# Patient Record
Sex: Female | Born: 1935
Health system: Southern US, Community
[De-identification: ages and names within clinical notes are randomized; demographics above are authoritative.]

## PROBLEM LIST (undated history)

## (undated) DIAGNOSIS — M4800 Spinal stenosis, site unspecified: Secondary | ICD-10-CM

## (undated) DIAGNOSIS — E119 Type 2 diabetes mellitus without complications: Secondary | ICD-10-CM

## (undated) DIAGNOSIS — H348392 Tributary (branch) retinal vein occlusion, unspecified eye, stable: Secondary | ICD-10-CM

## (undated) DIAGNOSIS — R269 Unspecified abnormalities of gait and mobility: Secondary | ICD-10-CM

## (undated) DIAGNOSIS — H9190 Unspecified hearing loss, unspecified ear: Secondary | ICD-10-CM

## (undated) DIAGNOSIS — I639 Cerebral infarction, unspecified: Secondary | ICD-10-CM

## (undated) DIAGNOSIS — D72829 Elevated white blood cell count, unspecified: Secondary | ICD-10-CM

## (undated) DIAGNOSIS — H35032 Hypertensive retinopathy, left eye: Secondary | ICD-10-CM

## (undated) DIAGNOSIS — I10 Essential (primary) hypertension: Secondary | ICD-10-CM

## (undated) DIAGNOSIS — H401190 Primary open-angle glaucoma, unspecified eye, stage unspecified: Secondary | ICD-10-CM

## (undated) DIAGNOSIS — G4700 Insomnia, unspecified: Secondary | ICD-10-CM

## (undated) DIAGNOSIS — M810 Age-related osteoporosis without current pathological fracture: Secondary | ICD-10-CM

## (undated) DIAGNOSIS — E785 Hyperlipidemia, unspecified: Secondary | ICD-10-CM

## (undated) DIAGNOSIS — G629 Polyneuropathy, unspecified: Secondary | ICD-10-CM

## (undated) HISTORY — DX: Cerebral infarction, unspecified: I63.9

## (undated) HISTORY — PX: TOTAL HIP ARTHROPLASTY: SHX124

## (undated) HISTORY — DX: Insomnia, unspecified: G47.00

## (undated) HISTORY — PX: EYE SURGERY: SHX253

## (undated) HISTORY — DX: Primary open-angle glaucoma, unspecified eye, stage unspecified: H40.1190

## (undated) HISTORY — DX: Tributary (branch) retinal vein occlusion, unspecified eye, stable: H34.8392

## (undated) HISTORY — DX: Unspecified hearing loss, unspecified ear: H91.90

## (undated) HISTORY — DX: Hyperlipidemia, unspecified: E78.5

## (undated) HISTORY — DX: Essential (primary) hypertension: I10

## (undated) HISTORY — DX: Type 2 diabetes mellitus without complications: E11.9

## (undated) HISTORY — DX: Age-related osteoporosis without current pathological fracture: M81.0

## (undated) HISTORY — DX: Elevated white blood cell count, unspecified: D72.829

## (undated) HISTORY — DX: Polyneuropathy, unspecified: G62.9

## (undated) HISTORY — DX: Unspecified abnormalities of gait and mobility: R26.9

## (undated) HISTORY — DX: Hypertensive retinopathy, left eye: H35.032

## (undated) HISTORY — DX: Spinal stenosis, site unspecified: M48.00

---

## 1989-03-21 HISTORY — PX: TOTAL HIP ARTHROPLASTY: SHX124

## 2003-03-22 HISTORY — PX: SPINE SURGERY: SHX786

## 2006-11-08 ENCOUNTER — Ambulatory Visit: Payer: Self-pay | Admitting: Vascular Surgery

## 2006-11-08 ENCOUNTER — Ambulatory Visit (HOSPITAL_COMMUNITY): Admission: RE | Admit: 2006-11-08 | Discharge: 2006-11-08 | Payer: Self-pay | Admitting: Family Medicine

## 2011-01-20 DIAGNOSIS — H401190 Primary open-angle glaucoma, unspecified eye, stage unspecified: Secondary | ICD-10-CM

## 2011-01-20 HISTORY — DX: Primary open-angle glaucoma, unspecified eye, stage unspecified: H40.1190

## 2011-02-18 DIAGNOSIS — H409 Unspecified glaucoma: Secondary | ICD-10-CM | POA: Insufficient documentation

## 2011-02-18 DIAGNOSIS — G629 Polyneuropathy, unspecified: Secondary | ICD-10-CM | POA: Insufficient documentation

## 2011-02-18 DIAGNOSIS — E119 Type 2 diabetes mellitus without complications: Secondary | ICD-10-CM | POA: Insufficient documentation

## 2012-01-06 DIAGNOSIS — Z961 Presence of intraocular lens: Secondary | ICD-10-CM | POA: Insufficient documentation

## 2012-01-06 DIAGNOSIS — H401190 Primary open-angle glaucoma, unspecified eye, stage unspecified: Secondary | ICD-10-CM | POA: Insufficient documentation

## 2012-01-06 DIAGNOSIS — H2100 Hyphema, unspecified eye: Secondary | ICD-10-CM | POA: Insufficient documentation

## 2012-01-06 DIAGNOSIS — H348392 Tributary (branch) retinal vein occlusion, unspecified eye, stable: Secondary | ICD-10-CM

## 2012-01-06 DIAGNOSIS — H4050X0 Glaucoma secondary to other eye disorders, unspecified eye, stage unspecified: Secondary | ICD-10-CM | POA: Insufficient documentation

## 2012-01-06 DIAGNOSIS — H02839 Dermatochalasis of unspecified eye, unspecified eyelid: Secondary | ICD-10-CM | POA: Insufficient documentation

## 2012-01-06 HISTORY — DX: Tributary (branch) retinal vein occlusion, unspecified eye, stable: H34.8392

## 2012-03-06 DIAGNOSIS — N179 Acute kidney failure, unspecified: Secondary | ICD-10-CM | POA: Insufficient documentation

## 2012-03-06 DIAGNOSIS — I10 Essential (primary) hypertension: Secondary | ICD-10-CM | POA: Insufficient documentation

## 2012-03-06 DIAGNOSIS — E871 Hypo-osmolality and hyponatremia: Secondary | ICD-10-CM | POA: Insufficient documentation

## 2012-03-06 DIAGNOSIS — E872 Acidosis: Secondary | ICD-10-CM | POA: Insufficient documentation

## 2012-03-06 DIAGNOSIS — E119 Type 2 diabetes mellitus without complications: Secondary | ICD-10-CM | POA: Insufficient documentation

## 2012-04-18 DIAGNOSIS — H35039 Hypertensive retinopathy, unspecified eye: Secondary | ICD-10-CM | POA: Insufficient documentation

## 2012-04-18 DIAGNOSIS — H35032 Hypertensive retinopathy, left eye: Secondary | ICD-10-CM

## 2012-04-18 DIAGNOSIS — H44519 Absolute glaucoma, unspecified eye: Secondary | ICD-10-CM | POA: Insufficient documentation

## 2012-04-18 HISTORY — DX: Hypertensive retinopathy, left eye: H35.032

## 2013-11-07 DIAGNOSIS — E1142 Type 2 diabetes mellitus with diabetic polyneuropathy: Secondary | ICD-10-CM | POA: Insufficient documentation

## 2013-11-07 DIAGNOSIS — D72820 Lymphocytosis (symptomatic): Secondary | ICD-10-CM | POA: Insufficient documentation

## 2013-11-07 DIAGNOSIS — D72829 Elevated white blood cell count, unspecified: Secondary | ICD-10-CM

## 2013-11-07 HISTORY — DX: Elevated white blood cell count, unspecified: D72.829

## 2014-02-06 DIAGNOSIS — G4700 Insomnia, unspecified: Secondary | ICD-10-CM | POA: Insufficient documentation

## 2015-10-05 DIAGNOSIS — R269 Unspecified abnormalities of gait and mobility: Secondary | ICD-10-CM

## 2015-10-05 HISTORY — DX: Unspecified abnormalities of gait and mobility: R26.9

## 2015-12-02 DIAGNOSIS — H9190 Unspecified hearing loss, unspecified ear: Secondary | ICD-10-CM

## 2015-12-02 HISTORY — DX: Unspecified hearing loss, unspecified ear: H91.90

## 2016-04-18 LAB — HEPATIC FUNCTION PANEL
ALK PHOS: 23 U/L — AB (ref 25–125)
ALT: 14 U/L (ref 7–35)
AST: 23 U/L (ref 13–35)
BILIRUBIN, TOTAL: 0.3 mg/dL

## 2016-04-18 LAB — CBC AND DIFFERENTIAL
HCT: 43 % (ref 36–46)
HEMOGLOBIN: 14.4 g/dL (ref 12.0–16.0)
Platelets: 190 10*3/uL (ref 150–399)
WBC: 11.8 10*3/mL

## 2016-04-18 LAB — BASIC METABOLIC PANEL
BUN: 20 mg/dL (ref 4–21)
Glucose: 135 mg/dL
POTASSIUM: 4.3 mmol/L (ref 3.4–5.3)
SODIUM: 146 mmol/L (ref 137–147)

## 2016-08-22 ENCOUNTER — Encounter: Payer: Self-pay | Admitting: *Deleted

## 2016-08-22 ENCOUNTER — Other Ambulatory Visit: Payer: Self-pay | Admitting: *Deleted

## 2016-08-22 ENCOUNTER — Telehealth: Payer: Self-pay | Admitting: *Deleted

## 2016-08-22 NOTE — Telephone Encounter (Signed)
Received CD from [facility] with pts medical records. Diane printed the last 2 years. Pt has new pt apt on 09/05/16. Records put on Dr. Idelle Leech desk for review.

## 2016-08-31 ENCOUNTER — Encounter: Payer: Self-pay | Admitting: Family Medicine

## 2016-08-31 DIAGNOSIS — M810 Age-related osteoporosis without current pathological fracture: Secondary | ICD-10-CM | POA: Insufficient documentation

## 2016-08-31 DIAGNOSIS — E785 Hyperlipidemia, unspecified: Secondary | ICD-10-CM | POA: Insufficient documentation

## 2016-08-31 DIAGNOSIS — I10 Essential (primary) hypertension: Secondary | ICD-10-CM | POA: Insufficient documentation

## 2016-08-31 DIAGNOSIS — M4800 Spinal stenosis, site unspecified: Secondary | ICD-10-CM | POA: Insufficient documentation

## 2016-08-31 DIAGNOSIS — G629 Polyneuropathy, unspecified: Secondary | ICD-10-CM | POA: Insufficient documentation

## 2016-09-01 ENCOUNTER — Telehealth: Payer: Self-pay | Admitting: Family Medicine

## 2016-09-01 ENCOUNTER — Encounter: Payer: Self-pay | Admitting: Family Medicine

## 2016-09-01 ENCOUNTER — Ambulatory Visit (INDEPENDENT_AMBULATORY_CARE_PROVIDER_SITE_OTHER): Payer: Medicare HMO | Admitting: Family Medicine

## 2016-09-01 VITALS — BP 171/84 | HR 60 | Temp 98.2°F | Resp 20 | Ht 63.5 in | Wt 152.2 lb

## 2016-09-01 DIAGNOSIS — E119 Type 2 diabetes mellitus without complications: Secondary | ICD-10-CM

## 2016-09-01 DIAGNOSIS — I10 Essential (primary) hypertension: Secondary | ICD-10-CM | POA: Diagnosis not present

## 2016-09-01 DIAGNOSIS — G629 Polyneuropathy, unspecified: Secondary | ICD-10-CM | POA: Diagnosis not present

## 2016-09-01 DIAGNOSIS — D72820 Lymphocytosis (symptomatic): Secondary | ICD-10-CM | POA: Diagnosis not present

## 2016-09-01 DIAGNOSIS — E785 Hyperlipidemia, unspecified: Secondary | ICD-10-CM | POA: Diagnosis not present

## 2016-09-01 MED ORDER — GABAPENTIN 300 MG PO CAPS: 600.0000 mg | ORAL_CAPSULE | Freq: Three times a day (TID) | ORAL | 5 refills | Status: DC

## 2016-09-01 MED ORDER — PRAVASTATIN SODIUM 10 MG PO TABS: 10.0000 mg | ORAL_TABLET | Freq: Every day | ORAL | 3 refills | Status: DC

## 2016-09-01 MED ORDER — METOPROLOL SUCCINATE ER 25 MG PO TB24: 25.0000 mg | ORAL_TABLET | Freq: Every day | ORAL | 0 refills | Status: DC

## 2016-09-01 MED ORDER — LISINOPRIL 20 MG PO TABS
20.0000 mg | ORAL_TABLET | Freq: Every day | ORAL | 0 refills | Status: DC
Start: 2016-09-01 — End: 2016-09-16

## 2016-09-01 MED ORDER — HYDRALAZINE HCL 25 MG PO TABS: 25.0000 mg | ORAL_TABLET | Freq: Two times a day (BID) | ORAL | 0 refills | Status: DC

## 2016-09-01 NOTE — Telephone Encounter (Signed)
Please call pt: - Patient was seen for establishment today, she is to have a follow-up appointment in 1-2 weeks for her hypertension follow-up. If possible I would like patient to come in 2 days prior to that follow-up appointment with the provider to have fasting labs completed. - Have placed future orders for these labs. - Please explained to her that if she can have them completed prior to her appointment, must be fasting, then we can review the results during her appointment time with me.

## 2016-09-01 NOTE — Progress Notes (Addendum)
Patient ID: Madison Martinez, female  DOB: 1928/01/19, 83 y.o.   MRN: [id number] Patient Care Team    Relationship Specialty Notifications Start End  Ma Hillock, DO PCP - General Family Medicine  09/01/16   Verdell Carmine, MD Referring Physician Internal Medicine  09/01/16   Renelda Loma, OD  Optometry  09/01/16     Chief Complaint  Patient presents with  . Establish Care  . Hypertension    Subjective:  Madison Martinez is a 83 y.o.  female present for new patient establishment. All past medical history, surgical history, allergies, family history, immunizations, medications and social history were obtained by patient and extensive EMR review today in the care everywhere tab. All recent labs, ED visits and hospitalizations within the last year were reviewed.  Essential hypertension/hyperlipidemia Pt reports compliance with Metoprolol-XL 25 mg daily, hydralazine 25 mg twice a [place] and lisinopril 20 mg daily. She does admit that she has been out of this medication for a couple days. Blood pressures ranges at home on the above regimen is reported to be around 601 systolic (that she remembers). Patient denies chest pain, shortness of breath or lower extremity edema. Pt does take a daily baby ASA. Pt is  prescribed  a statin. BMP: 04/18/2016, creatinine 0.92, BUN 20, potassium 4.3, GFR 57. Lipids: 08/05/2015 tchol 254, trig 452 CBC: 04/18/2016 CBC 11.8, hemoglobin 14.4, hematocrit 42.5, platelet 190, lymphocytes absolute 5.0 (high) monocytes absolute 0.7 (high) Diet: Monitors her diet, low-sodium Exercise: Does not exercise much, walks with a cane RF: Hypertension, hyperlipidemia, family history of heart disease  Type 2 diabetes mellitus without complication, without long-term current use of insulin (Madison Martinez) Patient has not been on medications for her diabetes for multiple years. Last available A1c was 6.9 08/05/2015. Patient states that she monitors her diet to control her  diabetes.  Polyneuropathy Uncertain cause for her polyneuropathy, unable to ease together any type of history. Uncertain if this is secondary to diabetes, however she's not on medications for her diabetes. She also has a history of spinal stenosis. She was placed on gabapentin in which she reports she takes 600 mg 3 times a [place]. GFR 57; 03/2016.  Monoclonal B-cell lymphocytosis: Patient does not provide much information surrounding her lymphocytosis. She reports that they thought she had cancer, but the blood tests that she did not, so she didn't go back. Electronic medical record review under the care everywhere tab, was able to locate record that the patient was seen and hematology/oncology in [place], though [place] with Dr. Vira Agar for leukocytosis, which the records suggest present since 2011. There are absolute lymphocyte count ranging from 5.2-5.7 over the time of 2011-2015. The patient was reported asymptomatic. Cytogenic testing was completed and showed a, kappa monocytic CD 5+ B-cell population (13% of total cells), and her absolute lymphocyte count was normal per their office notes. They recommended patient be seen every 3 months to monitor for possible progression to chronic lymphocytic leukemia, but patient did not desire follow-up secondary to monetary cost and was going to be followed by her primary care physician. Patient does not seem to completely understand the depth of the above findings, although not suggestive of leukemia at the time, should be monitored routinely for potential progression of recommendations of the oncologist. Since 08/08/2013 - 04/18/2016: WBC count has ranged from 9.7-12.7. Absolute lymphocytes 4.6-6.1, last being 5.0. She doesn't report any family history concerning for cancers of the lymph system. She denies any night sweats, weight  loss, easy bruising or increased fatigue. Per records she has been referred to podiatry for fungal nail infection and she  had a urinary tract infection, other than those incidences no reports of frequent infections.  Depression screen [facility] 2/9 09/01/2016  Decreased Interest 0  Down, Depressed, Hopeless 0  PHQ - 2 Score 0   No flowsheet data found.   Current Exercise Habits: Home exercise routine, Type of exercise: walking, Time (Minutes): 15, Frequency (Times/Week): 3, Weekly Exercise (Minutes/Week): 45, Intensity: Mild   Fall Risk  09/01/2016  Falls in the past year? No  Risk for fall due to : Impaired balance/gait;Impaired mobility      There is no immunization history on file for this patient.  No exam data present  Past Medical History:  Diagnosis Date  . Blind hypertensive eye, left 04/18/2012  . Gait disturbance 10/05/2015   pt declined neuro referral per records  . Hard of hearing 12/02/2015   hearing aids  . Hyperlipidemia   . Hypertension   . Insomnia    "tried melatonin, advil PM, xanax and klonopin without success"  . Leukocytosis 11/07/2013   seen oncology, did not want to continue to follow there, so continued follow up at PCP.  . Osteoporosis   . Polyneuropathy   . Primary open angle glaucoma 01/2011  . Spinal stenosis   . Stable branch retinal vein occlusion 01/06/2012  . Type 2 diabetes mellitus ([place])    "diet controlled"   Allergies  Allergen Reactions  . Amlodipine Besylate     Other reaction(s): Vertigo  . Flu Virus Vaccine Swelling   Past Surgical History:  Procedure Laterality Date  . EYE SURGERY    . [place]  2005  . TOTAL HIP ARTHROPLASTY Left 1991  . TOTAL HIP ARTHROPLASTY Right ?   x 2   Family History  Problem Relation Age of Onset  . Heart disease Brother    Social History   Social History  . Marital status: Divorced    Spouse name: N/A  . Number of children: 3  . Years of education: 12   Occupational History  . Not on file.   Social History Main Topics  . Smoking status: Never Smoker  . Smokeless tobacco: Never Used  . Alcohol use  No  . Drug use: No  . Sexual activity: No   Other Topics Concern  . Not on file   Social History Narrative   Divorced. 3 children [name], Max and [name]. Lives alone.   High school graduate.   Takes a daily vitamin.   Wears her seatbelt. Wears a hearing aid. Wears dentures.   Requires a cane for assistive device for walking.   Smoke detector in the home.   Feels safe in her relationships.   Allergies as of 09/01/2016      Reactions   Amlodipine Besylate    Other reaction(s): Vertigo   Flu Virus Vaccine Swelling      Medication List       Accurate as of 09/01/16  1:49 PM. Always use your most recent med list.          aspirin 81 MG EC tablet Take 1 tablet by mouth daily.   dorzolamide-timolol 22.3-6.8 MG/ML ophthalmic solution Commonly known as:  COSOPT Place 1 drop into the left eye 2 (two) times daily.   gabapentin 300 MG capsule Commonly known as:  NEURONTIN Take 2 capsules (600 mg total) by mouth 3 (three) times daily.   hydrALAZINE 25  MG tablet Commonly known as:  APRESOLINE Take 1 tablet (25 mg total) by mouth 2 (two) times daily.   latanoprost 0.005 % ophthalmic solution Commonly known as:  XALATAN Place 1 drop into the right eye at bedtime.   lisinopril 20 MG tablet Commonly known as:  PRINIVIL,ZESTRIL Take 1 tablet (20 mg total) by mouth daily.   metoprolol succinate 25 MG 24 hr tablet Commonly known as:  TOPROL-XL Take 1 tablet (25 mg total) by mouth daily.   pravastatin 10 MG tablet Commonly known as:  PRAVACHOL Take 1 tablet (10 mg total) by mouth daily.   VITAMIN D-1000 MAX ST 1000 units tablet Generic drug:  Cholecalciferol Take 2 tablets by mouth daily.       All past medical history, surgical history, allergies, family history, immunizations andmedications were updated in the EMR today and reviewed under the history and medication portions of their EMR.    No results found for this or any previous visit (from the past 2160  hour(s)).  No results found.   ROS: 14 pt review of systems performed and negative (unless mentioned in an HPI)  Objective: BP (!) 171/84 (BP Location: Left Arm, Patient Position: Sitting, Cuff Size: Normal)   Pulse 60   Temp 98.2 F (36.8 C)   Resp 20   Ht 5' 3.5" (1.613 m)   Wt 152 lb 4 oz (69.1 kg)   SpO2 95%   BMI 26.55 kg/m  Gen: Afebrile. No acute distress. Nontoxic in appearance, well-developed, well-nourished,  Very hard of hearing, pleasant Caucasian female. HENT: AT. [place].. MMM, no oral lesions, adequate dentition. no Cough on exam, no hoarseness on exam. Eyes:Pupils Equal Round Reactive to light, Extraocular movements intact,  Conjunctiva without redness, discharge or icterus. CV: RRR no murmur appreciated, no edema, +2/4 P posterior tibialis pulses. no carotid bruits. No JVD. Chest: CTAB, no wheeze, rhonchi or crackles.  Abd: Soft.NTND. BS present Skin:no rashes, purpura or petechiae. Warm and well-perfused. Skin intact. Neuro/Msk: Walks with cane. Legally blind left eye. Severely hard of hearing. Alert. Oriented x3.   Psych: Normal affect, dress and demeanor. Normal speech. Normal thought content and judgment.   Assessment/plan: Madison Martinez is a 83 y.o. female present for establishment of care. Patient is a rather poor historian, she is present with her son today who provides some history. Majority of the history was collected through electronic medical record research under care everywhere TAB. Essential hypertension/hyperlipidemia - Patient has been without her medications over the last 3 days. - Refills on Toprol XL 25 mg daily, hydralazine 25 mg twice a [place], and lisinopril 20 mg daily, given her home blood pressures she reports her normal on that regimen. Uncertain why hydralazine was added, instead of lisinopril increased, likely not metastatic, but will refill at this time and monitor on follow-up within next few weeks. Ideally would rather use one agent if  possible. - Patient is prescribed pravastatin, her last lipid panel was with elevated cholesterol extremely elevated triglycerides, uncertain if she was fasting. Last collected May 2017. Will have patient come in fasting for next appointment if able. - metoprolol succinate (TOPROL-XL) 25 MG 24 hr tablet; Take 1 tablet (25 mg total) by mouth daily.  Dispense: 30 tablet; Refill: 0 - hydrALAZINE (APRESOLINE) 25 MG tablet; Take 1 tablet (25 mg total) by mouth 2 (two) times daily.  Dispense: 60 tablet; Refill: 0 - lisinopril (PRINIVIL,ZESTRIL) 20 MG tablet; Take 1 tablet (20 mg total) by mouth daily.  Dispense: 30 tablet;  Refill: 0 - Refills on pravastatin provided. - Follow-up 1-2 weeks for blood pressure recheck, patient will be monitoring blood pressure at home and bring in recordings. We will collect CMP at that time.  Type 2 diabetes mellitus without complication, without long-term current use of insulin ([facility]) Last A1c 6.9 approximately one year ago in July 2016. She has been diet controlled. We will collect A1c with her follow-up appointment in 2 weeks.  Polyneuropathy - Uncertain cause or onset of polyneuropathy. Patient seems to have done well on gabapentin. On rather high-dose for age, but she denies sedation, and kidney function has been good. There has been some reports in past records of dizziness, that could have been secondary to gabapentin dose, will investigate further and attempt to taper back if able. - Refills on gabapentin provided.  Monoclonal B-cell lymphocytosis - Briefly discussed with patient and her son today the recommendations by the oncologist, reasoning, brief education on monoclonal B-cell lymphocytosis and CLL. Patient was provided with AVS information on signs/symptoms  of CLL.  - Collect CBC with differential, and peripheral smear on follow-up next week. Could consider flow cytometry. - Low threshold to refer back to hematology/oncology  Return in about 10 days  (around 09/11/2016) for HTN. and labs. Next appointment CBC with differential, peripheral smear, A1c, CMP and lipids.  Greater than 45 minutes was spent with patient, greater than 50% of that time was spent face-to-face with patient counseling, researching electronic medical records and coordinating care.    Electronically signed by: Howard Pouch, DO [place]

## 2016-09-01 NOTE — Patient Instructions (Addendum)
Restart medications for blood pressure. Follow up around 10 days and we will recheck BP and labs.     This is what the blood specialist was monitoring you for.... Chronic Lymphocytic Leukemia Chronic lymphocytic leukemia (CLL) is a type of cancer of the blood cells and soft tissue inside bones (bone marrow). CLL happens when your bone marrow makes too many abnormal white blood cells. The cells, called leukemia cells, do not function normally and accumulate in the blood. Eventually they crowd out other healthy blood cells. CLL usually gets worse slowly. It can cause complications in your organs, such as in your spleen. It can also weaken your immune system and lead to conditions in which your immune system attacks your body (autoimmune conditions). What are the causes? The cause of this condition is not known. What increases the risk? You are more likely to develop this condition if:  You are older than 50 years.  You are white.  You are female.  You have a family history of CLL or other cancers of the lymph system.  You are of [place] or [place] descent.  You have been exposed to certain chemicals, such as: ? Insecticides. ? Herbicides. These include Agent Orange, a herbicide used in the [place] war.  What are the signs or symptoms? At first, there may be no symptoms. After a while, symptoms may include:  Feeling more tired than usual, even after rest.  Unplanned weight loss.  Heavy sweating at night.  Fever.  Shortness of breath.  Paleness.  Painless, swollen lymph nodes.  A feeling of fullness in the upper left part of the abdomen.  Easy bruising or bleeding.  Frequent infections.  How is this diagnosed? This condition is diagnosed based on:  A physical exam to check for an enlarged spleen, liver, or lymph nodes.  Blood and bone marrow tests to check for leukemia cells. Tests may include: ? A complete blood count. ? Flow cytometry. This  method uses light sensors and dyes to figure out the number of cells as well as their size, structure, and general health. ? Immunophenotyping. This method is used to diagnose leukemia by identifying specific antibodies found in white blood cells. The test is used when a complete blood count shows the presence of immature cells or a high number of white blood cells. ? Fluorescence in situ hybridization (FISH). This test is used to examine defects in chromosomes and how those defects affect the functioning of the cell. Results from a Dawson test will be used to determine treatment and assess the outcome of that treatment.  A CT scan to check for swelling or anything abnormal in your spleen, liver, and lymph nodes.  How is this treated? Treatment for this condition depends on the stage of the leukemia and whether you have symptoms. Treatment may include:  Observation.  Targeted drugs. These are medicines that interfere with the way leukemia cells grow and multiply. They identify and attack specific leukemia cells without harming normal cells.  Chemotherapy drugs. These are medicines that kill leukemia cells that are multiplying quickly.  Radiation.  Surgery to remove the spleen.  Biological therapy (immunotherapy). This treatment boosts the ability of your immune system to fight the leukemia cells.  Bone marrow or peripheral blood stem cell transplant. This treatment replaces your own bone marrow or stem cells with bone marrow or stem cells from a donor. This treatment may be done after you receive very high doses of chemotherapy or radiation that  kill your stem cells and bone marrow.  New treatments through clinical trials.  Additional medicines may be needed to help manage symptoms. Follow these instructions at home: Medicines  Take over-the-counter and prescription medicines only as told by your health care provider.  If you were prescribed an antibiotic medicine, take it as told by  your health care provider. Do not stop taking the antibiotic even if you start to feel better. If you are on chemotherapy:  Wash your hands often, especially before meals, after being outside, and after using the toilet. Have visitors do the same.  Keep your teeth and gums clean and well cared for. Use soft toothbrushes.  Protect your skin from the sun by using sunscreen and wearing protective clothing. General instructions  Avoid contact sports or other rough activities. Ask your health care provider what activities are safe for you.  Avoid crowded places and people who are sick.  Tell your cancer care team if you develop side effects. They may be able to recommend ways to relieve them.  Try to eat regular, healthy meals. Some of your treatments might affect your appetite.  Find healthy ways of coping with stress, such as by doing yoga or meditation or by joining a support group.  Keep all follow-up visits as told by your health care provider. This is important. Where to find more information:  American Cancer Society: www.cancer.org  Leukemia and Lymphoma Society: PreviewPal.pl  National Cancer Institute ([place]): www.cancer.gov Contact a health care provider if:  You have pain in your abdomen.  You develop new bruises that are getting bigger.  You have painful or more swollen lymph nodes.  You develop bleeding from your gums or nose.  You cannot eat or drink without vomiting.  You feel lightheaded. Get help right away if:  You have a fever or chills.  You develop chest pain.  You have trouble breathing or feel short of breath.  You faint.  There is blood in your urine or stool.  You have excessive bleeding.  You have any symptoms that are severe or uncontrolled. Summary  Chronic lymphocytic leukemia (CLL) is a type of cancer of the blood cells and bone marrow.  This condition can cause an enlarged spleen, swollen lymph nodes, a weakened immune system, low red  blood cell and platelets counts, and autoimmune conditions.  Treatment for this condition depends on the stage of the cancer and whether you have symptoms.  Chemotherapy, radiation, surgery to remove the spleen, and bone marrow transplant are some of the ways to treat CLL. This information is not intended to replace advice given to you by your health care provider. Make sure you discuss any questions you have with your health care provider. Document Released: 07/24/2008 Document Revised: 02/17/2016 Document Reviewed: 02/17/2016 Elsevier Interactive Patient Education  2017 [employer].

## 2016-09-02 NOTE — Telephone Encounter (Signed)
Spoke with patient son scheduled appts.

## 2016-09-05 ENCOUNTER — Ambulatory Visit: Payer: Self-pay | Admitting: Family Medicine

## 2016-09-14 ENCOUNTER — Other Ambulatory Visit (INDEPENDENT_AMBULATORY_CARE_PROVIDER_SITE_OTHER): Payer: Medicare HMO

## 2016-09-14 DIAGNOSIS — I10 Essential (primary) hypertension: Secondary | ICD-10-CM | POA: Diagnosis not present

## 2016-09-14 DIAGNOSIS — E785 Hyperlipidemia, unspecified: Secondary | ICD-10-CM

## 2016-09-14 DIAGNOSIS — E119 Type 2 diabetes mellitus without complications: Secondary | ICD-10-CM

## 2016-09-14 DIAGNOSIS — D72820 Lymphocytosis (symptomatic): Secondary | ICD-10-CM

## 2016-09-14 LAB — CBC WITH DIFFERENTIAL/PLATELET
BASOS ABS: 111 {cells}/uL (ref 0–200)
Basophils Relative: 1 %
Eosinophils Absolute: 333 cells/uL (ref 15–500)
Eosinophils Relative: 3 %
HEMATOCRIT: 42.5 % (ref 35.0–45.0)
HEMOGLOBIN: 13.9 g/dL (ref 11.7–15.5)
LYMPHS ABS: 4773 {cells}/uL — AB (ref 850–3900)
LYMPHS PCT: 43 %
MCH: 30.4 pg (ref 27.0–33.0)
MCHC: 32.7 g/dL (ref 32.0–36.0)
MCV: 93 fL (ref 80.0–100.0)
MONO ABS: 888 {cells}/uL (ref 200–950)
MPV: 11.6 fL (ref 7.5–12.5)
Monocytes Relative: 8 %
NEUTROS PCT: 45 %
Neutro Abs: 4995 cells/uL (ref 1500–7800)
Platelets: 242 10*3/uL (ref 140–400)
RBC: 4.57 MIL/uL (ref 3.80–5.10)
RDW: 13.6 % (ref 11.0–15.0)
WBC: 11.1 10*3/uL — ABNORMAL HIGH (ref 3.8–10.8)

## 2016-09-15 LAB — LIPID PANEL
CHOLESTEROL: 250 mg/dL — AB (ref <200)
HDL: 33 mg/dL — ABNORMAL LOW (ref >50)
TRIGLYCERIDES: 632 mg/dL — AB (ref <150)
Total CHOL/HDL Ratio: 7.6 Ratio — ABNORMAL HIGH (ref <5.0)

## 2016-09-15 LAB — COMPLETE METABOLIC PANEL WITH GFR
ALK PHOS: 88 U/L (ref 33–130)
ALT: 12 U/L (ref 6–29)
AST: 20 U/L (ref 10–35)
Albumin: 4.3 g/dL (ref 3.6–5.1)
BILIRUBIN TOTAL: 0.4 mg/dL (ref 0.2–1.2)
BUN: 14 mg/dL (ref 7–25)
CALCIUM: 9.7 mg/dL (ref 8.6–10.4)
CO2: 25 mmol/L (ref 20–31)
CREATININE: 1.02 mg/dL — AB (ref 0.60–0.88)
Chloride: 106 mmol/L (ref 98–110)
GFR, EST AFRICAN AMERICAN: 57 mL/min — AB (ref >=60)
GFR, Est Non African American: 50 mL/min — ABNORMAL LOW (ref >=60)
Glucose, Bld: 142 mg/dL — ABNORMAL HIGH (ref 65–99)
Potassium: 5 mmol/L (ref 3.5–5.3)
Sodium: 141 mmol/L (ref 135–146)
TOTAL PROTEIN: 7.3 g/dL (ref 6.1–8.1)

## 2016-09-15 LAB — PATHOLOGIST SMEAR REVIEW

## 2016-09-15 LAB — HEMOGLOBIN A1C
Hgb A1c MFr Bld: 7 % — ABNORMAL HIGH (ref <5.7)
Mean Plasma Glucose: 154 mg/dL

## 2016-09-16 ENCOUNTER — Telehealth: Payer: Self-pay | Admitting: Family Medicine

## 2016-09-16 ENCOUNTER — Encounter: Payer: Self-pay | Admitting: Family Medicine

## 2016-09-16 ENCOUNTER — Ambulatory Visit (INDEPENDENT_AMBULATORY_CARE_PROVIDER_SITE_OTHER): Payer: Medicare HMO | Admitting: Family Medicine

## 2016-09-16 VITALS — BP 157/80 | HR 75 | Temp 98.7°F | Resp 20 | Ht 64.0 in | Wt 145.5 lb

## 2016-09-16 DIAGNOSIS — E785 Hyperlipidemia, unspecified: Secondary | ICD-10-CM

## 2016-09-16 DIAGNOSIS — I10 Essential (primary) hypertension: Secondary | ICD-10-CM | POA: Diagnosis not present

## 2016-09-16 DIAGNOSIS — D72820 Lymphocytosis (symptomatic): Secondary | ICD-10-CM

## 2016-09-16 DIAGNOSIS — E119 Type 2 diabetes mellitus without complications: Secondary | ICD-10-CM

## 2016-09-16 MED ORDER — HYDRALAZINE HCL 25 MG PO TABS: 25.0000 mg | ORAL_TABLET | Freq: Three times a day (TID) | ORAL | 1 refills | Status: DC

## 2016-09-16 MED ORDER — LISINOPRIL 20 MG PO TABS: 20.0000 mg | ORAL_TABLET | Freq: Every day | ORAL | 1 refills | Status: DC

## 2016-09-16 MED ORDER — GABAPENTIN 300 MG PO CAPS: 600.0000 mg | ORAL_CAPSULE | Freq: Two times a day (BID) | ORAL | 5 refills | Status: DC

## 2016-09-16 MED ORDER — METOPROLOL SUCCINATE ER 25 MG PO TB24: 25.0000 mg | ORAL_TABLET | Freq: Every day | ORAL | 1 refills | Status: DC

## 2016-09-16 NOTE — Progress Notes (Signed)
Madison Martinez     Patient ID: Madison Martinez, female  DOB: 10-29-1927, 83 y.o.   MRN: [id number] Patient Care Team    Relationship Specialty Notifications Start End  Ma Hillock, DO PCP - General Family Medicine  09/01/16   Verdell Carmine, MD Referring Physician Internal Medicine  09/01/16   Renelda Loma, OD  Optometry  09/01/16     Chief Complaint  Patient presents with  . Hypertension  . Results    labs done 09/14/16    Subjective:  Madison Martinez is a 83 y.o.  female present for follow up  Essential hypertension/hyperlipidemia/CKD3 Pt reports compliance with Metoprolol-XL 25 mg daily, hydralazine 25 mg twice a [place] and lisinopril 20 mg daily.  Pt is  prescribed  a statin. She brings with her her home blood pressures as requested, which range from 108/63-153/86, with the majority of her blood pressures in the 140s/80s. Patient denies chest pain, shortness of breath, dizziness or lower extremity edema.  BMP: 09/14/2016, creatinine 1.02, GFR 50 Lipids: 09/14/2016, total cholesterol 250, could not calculate LDL secondary to triglycerides of 632. Patient reports she was fasting overnight. CBC: 09/14/2016, WBC 11.1, absolute lymphocytosis (chronic)  Diet: Monitors her diet, low-sodium Exercise: Does not exercise much, walks with a cane RF: Hypertension, hyperlipidemia, family history of heart disease  Type 2 diabetes mellitus without complication, without long-term current use of insulin ([place]) Patient has not been on medications for her diabetes for multiple years.  A1c was 6.9 08/05/2015. Patient states that she monitors her diet to control her diabetes. A1 C was repeated for pre-lab appointment, and was 7.0.    Monoclonal B-cell lymphocytosis: WBC 11.1, neutrophil #[id number], lymphocyte #[id number] (high). Pathological smear read by pathologist reports leukocytosis due to absolute lymphocytosis. Myeloid population consists predominantly of mature segmented neutrophils with reactive changes. A few atypical  lymphs. No immature cells are identified. Red blood cells and platelets are unremarkable. It was recommended that the features could be reactive, however if absolute lymphocytosis persists suggest immunophenotyping by flow cytometry for further evaluation. Given patient's inconsistent care surrounding this condition, I recommended her being evaluated by hematologist again.   Prior note: Patient does not provide much information surrounding her lymphocytosis. She reports that they thought she had cancer, but the blood tests that she did not, so she didn't go back. Electronic medical record review under the care everywhere tab, was able to locate record that the patient was seen and hematology/oncology in [place], though [place] with Dr. Vira Agar for leukocytosis, which the records suggest present since 2011. There are absolute lymphocyte count ranging from 5.2-5.7 over the time of 2011-2015. The patient was reported asymptomatic. Cytogenic testing was completed and showed a, kappa monocytic CD 5+ B-cell population (13% of total cells), and her absolute lymphocyte count was normal per their office notes. They recommended patient be seen every 3 months to monitor for possible progression to chronic lymphocytic leukemia, but patient did not desire follow-up secondary to monetary cost and was going to be followed by her primary care physician. Patient does not seem to completely understand the depth of the above findings, although not suggestive of leukemia at the time, should be monitored routinely for potential progression of recommendations of the oncologist. Since 08/08/2013 - 04/18/2016: WBC count has ranged from 9.7-12.7. Absolute lymphocytes 4.6-6.1, last being 5.0. She doesn't report any family history concerning for cancers of the lymph system. She denies any night sweats, weight loss, easy bruising or increased fatigue. Per records she has  been referred to podiatry for fungal nail infection and  she had a urinary tract infection, other than those incidences no reports of frequent infections.  Depression screen [facility] 2/9 09/01/2016  Decreased Interest 0  Down, Depressed, Hopeless 0  PHQ - 2 Score 0   No flowsheet data found.     Fall Risk  09/01/2016  Falls in the past year? No  Risk for fall due to : Impaired balance/gait;Impaired mobility    There is no immunization history on file for this patient.  No exam data present  Past Medical History:  Diagnosis Date  . Blind hypertensive eye, left 04/18/2012  . Gait disturbance 10/05/2015   pt declined neuro referral per records  . Hard of hearing 12/02/2015   hearing aids  . Hyperlipidemia   . Hypertension   . Insomnia    "tried melatonin, advil PM, xanax and klonopin without success"  . Leukocytosis 11/07/2013   seen oncology, did not want to continue to follow there, so continued follow up at PCP.  . Osteoporosis   . Polyneuropathy   . Primary open angle glaucoma 01/2011  . Spinal stenosis   . Stable branch retinal vein occlusion 01/06/2012  . Type 2 diabetes mellitus ([place])    "diet controlled"   Allergies  Allergen Reactions  . Amlodipine Besylate     Other reaction(s): Vertigo  . Flu Virus Vaccine Swelling   Past Surgical History:  Procedure Laterality Date  . EYE SURGERY    . [place]  2005  . TOTAL HIP ARTHROPLASTY Left 1991  . TOTAL HIP ARTHROPLASTY Right ?   x 2   Family History  Problem Relation Age of Onset  . Heart disease Brother    Social History   Social History  . Marital status: Divorced    Spouse name: N/A  . Number of children: 3  . Years of education: 12   Occupational History  . Not on file.   Social History Main Topics  . Smoking status: Never Smoker  . Smokeless tobacco: Never Used  . Alcohol use No  . Drug use: No  . Sexual activity: No   Other Topics Concern  . Not on file   Social History Narrative   Divorced. 3 children [name], Max and [name]. Lives alone.     High school graduate.   Takes a daily vitamin.   Wears her seatbelt. Wears a hearing aid. Wears dentures.   Requires a cane for assistive device for walking.   Smoke detector in the home.   Feels safe in her relationships.   Allergies as of 09/16/2016      Reactions   Amlodipine Besylate    Other reaction(s): Vertigo   Flu Virus Vaccine Swelling      Medication List       Accurate as of 09/16/16  7:12 PM. Always use your most recent med list.          aspirin 81 MG EC tablet Take 1 tablet by mouth daily.   dorzolamide-timolol 22.3-6.8 MG/ML ophthalmic solution Commonly known as:  COSOPT Place 1 drop into the left eye 2 (two) times daily.   gabapentin 300 MG capsule Commonly known as:  NEURONTIN Take 2 capsules (600 mg total) by mouth 3 (three) times daily.   hydrALAZINE 25 MG tablet Commonly known as:  APRESOLINE Take 1 tablet (25 mg total) by mouth 3 (three) times daily.   latanoprost 0.005 % ophthalmic solution Commonly known as:  XALATAN Place 1  drop into the right eye at bedtime.   lisinopril 20 MG tablet Commonly known as:  PRINIVIL,ZESTRIL Take 1 tablet (20 mg total) by mouth daily.   metoprolol succinate 25 MG 24 hr tablet Commonly known as:  TOPROL-XL Take 1 tablet (25 mg total) by mouth daily.   pravastatin 10 MG tablet Commonly known as:  PRAVACHOL Take 1 tablet (10 mg total) by mouth daily.   VITAMIN D-1000 MAX ST 1000 units tablet Generic drug:  Cholecalciferol Take 2 tablets by mouth daily.       All past medical history, surgical history, allergies, family history, immunizations andmedications were updated in the EMR today and reviewed under the history and medication portions of their EMR.    Recent Results (from the past 2160 hour(s))  CBC w/Diff     Status: Abnormal   Collection Time: 09/14/16  8:00 AM  Result Value Ref Range   WBC 11.1 (H) 3.8 - 10.8 K/uL   RBC 4.57 3.80 - 5.10 MIL/uL   Hemoglobin 13.9 11.7 - 15.5 g/dL   HCT 42.5  35.0 - 45.0 %   MCV 93.0 80.0 - 100.0 fL   MCH 30.4 27.0 - 33.0 pg   MCHC 32.7 32.0 - 36.0 g/dL   RDW 13.6 11.0 - 15.0 %   Platelets 242 140 - 400 K/uL   MPV 11.6 7.5 - 12.5 fL   Neutro Abs 4,995 1,500 - 7,800 cells/uL   Lymphs Abs 4,773 (H) 850 - 3,900 cells/uL   Monocytes Absolute 888 200 - 950 cells/uL   Eosinophils Absolute 333 15 - 500 cells/uL   Basophils Absolute 111 0 - 200 cells/uL   Neutrophils Relative % 45 %   Lymphocytes Relative 43 %   Monocytes Relative 8 %   Eosinophils Relative 3 %   Basophils Relative 1 %   Smear Review Criteria for review not met   Pathologist smear review     Status: None   Collection Time: 09/14/16  8:00 AM  Result Value Ref Range   Path Review SEE NOTE     Comment: Leukocytosis due to absolute lymphocytosis.  Myeloid population consists predominantly of mature segmented neutrophils with reactive changes. A few atypical lymphs. No immature cells are identified.  The features may be reactive; however, if absolute lymphocytosis persists, suggest immunophenotyping by flow cytometry for further evaluation, if clinically indicated. RBCs and platelets are unremarkable.  Reviewed by Francis Gaines Mammarappallil MD (Electronic Signature on File) 09/15/16   Hemoglobin A1c     Status: Abnormal   Collection Time: 09/14/16  8:00 AM  Result Value Ref Range   Hgb A1c MFr Bld 7.0 (H) <5.7 %    Comment:   For someone without known diabetes, a hemoglobin A1c value of 6.5% or greater indicates that they may have diabetes and this should be confirmed with a follow-up test.   For someone with known diabetes, a value <7% indicates that their diabetes is well controlled and a value greater than or equal to 7% indicates suboptimal control. A1c targets should be individualized based on duration of diabetes, age, comorbid conditions, and other considerations.   Currently, no consensus exists for use of hemoglobin A1c for diagnosis of diabetes for children.        Mean Plasma Glucose 154 mg/dL  COMPLETE METABOLIC PANEL WITH GFR     Status: Abnormal   Collection Time: 09/14/16  8:00 AM  Result Value Ref Range   Sodium 141 135 - 146 mmol/L   Potassium  5.0 3.5 - 5.3 mmol/L   Chloride 106 98 - 110 mmol/L   CO2 25 20 - 31 mmol/L   Glucose, Bld 142 (H) 65 - 99 mg/dL   BUN 14 7 - 25 mg/dL   Creat 1.02 (H) 0.60 - 0.88 mg/dL    Comment:   For patients > or = 81 years of age: The upper reference limit for Creatinine is approximately 13% higher for people identified as African-American.      Total Bilirubin 0.4 0.2 - 1.2 mg/dL   Alkaline Phosphatase 88 33 - 130 U/L   AST 20 10 - 35 U/L   ALT 12 6 - 29 U/L   Total Protein 7.3 6.1 - 8.1 g/dL   Albumin 4.3 3.6 - 5.1 g/dL   Calcium 9.7 8.6 - 10.4 mg/dL   GFR, Est African American 57 (L) >=60 mL/min   GFR, Est Non African American 50 (L) >=60 mL/min  Lipid panel     Status: Abnormal   Collection Time: 09/14/16  8:00 AM  Result Value Ref Range   Cholesterol 250 (H) <200 mg/dL   Triglycerides 632 (H) <150 mg/dL   HDL 33 (L) >50 mg/dL   Total CHOL/HDL Ratio 7.6 (H) <5.0 Ratio   VLDL NOT CALC <30 mg/dL    Comment:   Not calculated due to Triglyceride >400. Suggest ordering Direct LDL (Unit Code: [phone]).    LDL Cholesterol NOT CALC <100 mg/dL    Comment:   Not calculated due to Triglyceride >400. Suggest ordering Direct LDL (Unit Code: [phone]).     No results found.   ROS: 14 pt review of systems performed and negative (unless mentioned in an HPI)  Objective: BP (!) 157/80 (BP Location: Right Arm, Patient Position: Sitting, Cuff Size: Normal)   Pulse 75   Temp 98.7 F (37.1 C)   Resp 20   Ht _0  (1.626 m)   Wt 145 lb 8 oz (66 kg)   SpO2 97%   BMI 24.98 kg/m     Assessment/plan: Madison Martinez is a 83 y.o. female present follow up  after recent establishment of care. Essential hypertension/hyperlipidemia/CKD3 - Home blood pressures are still not quite at goal, but are close.  Pressure here is higher than any of her blood pressures at home. - Continue Toprol XL 25 mg daily, lisinopril 20 mg daily. Refills called in to her mail-in pharmacy today for 6 months. - Hydralazine increased to 25 mg 3 times a [place], refills called on this medicine to her mail-in pharmacy. - She will continue to monitor her blood pressures at home, goal < 135/85 - Continue pravastatin, no refills needed at this time. Discussed her rather elevated triglycerides. Patient does not want to start another medication, such as fenofibrate. Recommended to at least try fish oil 2 g daily, and she is agreeable to do that daily. Recheck cholesterol in 3-6 months. - BMP 09/14/2016: GFR 50, creatinine 1.02, monitor every 6 months. - Calcium/PTH/vitamin D yearly can collect next visit. - Follow-up 3 months, as long as increasing hydralazine brings her blood pressure below goal. Otherwise follow-up sooner.  Type 2 diabetes mellitus without complication, without long-term current use of insulin ([facility]) A1c 7.0 today. Given her age, I wouldn't start medications on her unless above 7.0. Patient to monitor her diet more closely, and try to get some exercises if able. - Recheck in 3 months  Monoclonal B-cell lymphocytosis - Discussed today's findings, and the pathologist repeat smear with her and her  son today. Despite repeated explanation, she does not seem to understand the need for follow-up on this issue. I was able to get her to at least agree to go back to her hematologist for follow-up. I am uncertain to have any of the she will agree to do. If I am given guidelines and recommendations from hematology, I would be okay following her for this every 3 months with her other chronic illnesses. But I would still suggest she follows with the hematologist at least once a year or every 3 months if I'm not given guidelines. Try to have her understand this is outside of the scope of primary care and for her safety should be  managed by specialist. - Referral back to her oncologist in [place], which is with [facility].  Return in about 3 months (around 12/17/2016) for HTN'.   Electronically signed by: Howard Pouch, DO [place]

## 2016-09-16 NOTE — Patient Instructions (Addendum)
Increase the hydralazine to every 8 hours.  Keep the other medications the same.  Keep monitoring BP. Goal <135/85.  I am referring you to a blood specialist (hematologist).

## 2016-09-16 NOTE — Telephone Encounter (Signed)
Please call pt or contact: - In review of her meds, I want her to decrease her Gabapentin to 600 mg (2 tabs) every 12 hours.  - this medication is dosed by kidney function and her dose of TID was too much for her kidney function. I made the change in epic, but she will need to make the change on her current med bottle.

## 2016-09-19 ENCOUNTER — Other Ambulatory Visit: Payer: Self-pay

## 2016-09-19 DIAGNOSIS — I10 Essential (primary) hypertension: Secondary | ICD-10-CM

## 2016-09-19 MED ORDER — HYDRALAZINE HCL 25 MG PO TABS: 25.0000 mg | ORAL_TABLET | Freq: Three times a day (TID) | ORAL | 0 refills | Status: DC

## 2016-09-19 NOTE — Telephone Encounter (Signed)
Patient's son called stating that patient has not received medication from [facility] as of yet and would like to have enough to bridge her until it has arrived.  10 Zenker supply sent to [facility] in [place].

## 2016-09-19 NOTE — Telephone Encounter (Signed)
Spoke with patient sone reviewed medication change. He verbalized understanding of all instructions.

## 2016-11-29 DIAGNOSIS — L600 Ingrowing nail: Secondary | ICD-10-CM | POA: Diagnosis not present

## 2016-12-16 ENCOUNTER — Encounter: Payer: Self-pay | Admitting: Family Medicine

## 2016-12-16 ENCOUNTER — Ambulatory Visit (INDEPENDENT_AMBULATORY_CARE_PROVIDER_SITE_OTHER): Payer: Medicare HMO | Admitting: Family Medicine

## 2016-12-16 VITALS — BP 158/92 | HR 71 | Temp 98.0°F | Resp 20 | Ht 64.0 in | Wt 151.2 lb

## 2016-12-16 DIAGNOSIS — N183 Chronic kidney disease, stage 3 unspecified: Secondary | ICD-10-CM | POA: Insufficient documentation

## 2016-12-16 DIAGNOSIS — E119 Type 2 diabetes mellitus without complications: Secondary | ICD-10-CM | POA: Diagnosis not present

## 2016-12-16 DIAGNOSIS — Z9989 Dependence on other enabling machines and devices: Secondary | ICD-10-CM

## 2016-12-16 DIAGNOSIS — E785 Hyperlipidemia, unspecified: Secondary | ICD-10-CM | POA: Diagnosis not present

## 2016-12-16 DIAGNOSIS — I10 Essential (primary) hypertension: Secondary | ICD-10-CM

## 2016-12-16 DIAGNOSIS — M810 Age-related osteoporosis without current pathological fracture: Secondary | ICD-10-CM

## 2016-12-16 DIAGNOSIS — D72820 Lymphocytosis (symptomatic): Secondary | ICD-10-CM

## 2016-12-16 DIAGNOSIS — N1832 Chronic kidney disease, stage 3b: Secondary | ICD-10-CM | POA: Insufficient documentation

## 2016-12-16 DIAGNOSIS — R262 Difficulty in walking, not elsewhere classified: Secondary | ICD-10-CM | POA: Diagnosis not present

## 2016-12-16 LAB — BASIC METABOLIC PANEL
BUN: 13 mg/dL (ref 6–23)
CALCIUM: 10.1 mg/dL (ref 8.4–10.5)
CO2: 27 meq/L (ref 19–32)
Chloride: 103 mEq/L (ref 96–112)
Creatinine, Ser: 0.91 mg/dL (ref 0.40–1.20)
GFR: 62.1 mL/min (ref >60.00)
Glucose, Bld: 150 mg/dL — ABNORMAL HIGH (ref 70–99)
POTASSIUM: 4.2 meq/L (ref 3.5–5.1)
Sodium: 138 mEq/L (ref 135–145)

## 2016-12-16 LAB — POCT GLYCOSYLATED HEMOGLOBIN (HGB A1C): Hemoglobin A1C: 6.8

## 2016-12-16 NOTE — Patient Instructions (Signed)
I am going to send a nurse out to check your blood pressure at home. If it is still elevated by their reports, we will adjust your medications and see you back sooner.  If well controlled, will see you back in 3-4 months.    Please help [place] help you:  We are honored you have chosen [facility] for your Primary Care home. Below you will find basic instructions that you may need to access in the future. Please help [place] help you by reading the instructions, which cover many of the frequent questions we experience.   Prescription refills and request:  -In order to allow more efficient response time, please call your pharmacy for all refills. They will forward the request electronically to [place]. This allows for the quickest possible response. Request left on a nurse line can take longer to refill, since these are checked as time allows between office patients and other phone calls.  - refill request can take up to 3-5 working days to complete.  - If request is sent electronically and request is appropiate, it is usually completed in 1-2 business days.  - all patients will need to be seen routinely for all chronic medical conditions requiring prescription medications (see follow-up below). If you are overdue for follow up on your condition, you will be asked to make an appointment and we will call in enough medication to cover you until your appointment (up to 30 days).  - all controlled substances will require a face to face visit to request/refill.  - if you desire your prescriptions to go through a new pharmacy, and have an active script at original pharmacy, you will need to call your pharmacy and have scripts transferred to new pharmacy. This is completed between the pharmacy locations and not by your provider.    Results: If any images or labs were ordered, it can take up to 1 week to get results depending on the test ordered and the lab/facility running and resulting the test. - Normal or stable  results, which do not need further discussion, may be released to your mychart immediately with attached note to you. A call may not be generated for normal results. Please make certain to sign up for mychart. If you have questions on how to activate your mychart you can call the front office.  - If your results need further discussion, our office will attempt to contact you via phone, and if unable to reach you after 2 attempts, we will release your abnormal result to your mychart with instructions.  - All results will be automatically released in mychart after 1 week.  - Your provider will provide you with explanation and instruction on all relevant material in your results. Please keep in mind, results and labs may appear confusing or abnormal to the untrained eye, but it does not mean they are actually abnormal for you personally. If you have any questions about your results that are not covered, or you desire more detailed explanation than what was provided, you should make an appointment with your provider to do so.   Our office handles many outgoing and incoming calls daily. If we have not contacted you within 1 week about your results, please check your mychart to see if there is a message first and if not, then contact our office.  In helping with this matter, you help decrease call volume, and therefore allow [place] to be able to respond to patients needs more efficiently.  Acute office visits (sick visit):  An acute visit is intended for a new problem and are scheduled in shorter time slots to allow schedule openings for patients with new problems. This is the appropriate visit to discuss a new problem. In order to provide you with excellent quality medical care with proper time for you to explain your problem, have an exam and receive treatment with instructions, these appointments should be limited to one new problem per visit. If you experience a new problem, in which you desire to be addressed,  please make an acute office visit, we save openings on the schedule to accommodate you. Please do not save your new problem for any other type of visit, let us take care of it properly and quickly for you.   Follow up visits:  Depending on your condition(s) your provider will need to see you routinely in order to provide you with quality care and prescribe medication(s). Most chronic conditions (Example: hypertension, Diabetes, depression/anxiety... etc), require visits a couple times a year. Your provider will instruct you on proper follow up for your personal medical conditions and history. Please make certain to make follow up appointments for your condition as instructed. Failing to do so could result in lapse in your medication treatment/refills. If you request a refill, and are overdue to be seen on a condition, we will always provide you with a [address] [place] script (once) to allow you time to schedule.    Medicare wellness (well visit): - we have a wonderful Nurse Maudie Mercury), that will meet with you and provide you will yearly medicare wellness visits. These visits should occur yearly (can not be scheduled less than 1 calendar year apart) and cover preventive health, immunizations, advance directives and screenings you are entitled to yearly through your medicare benefits. Do not miss out on your entitled benefits, this is when medicare will pay for these benefits to be ordered for you.  These are strongly encouraged by your provider and is the appropriate type of visit to make certain you are up to date with all preventive health benefits. If you have not had your medicare wellness exam in the last 12 months, please make certain to schedule one by calling the office and schedule your medicare wellness with Maudie Mercury as soon as possible.   Yearly physical (well visit):  - Adults are recommended to be seen yearly for physicals. Check with your insurance and date of your last physical, most insurances require one  calendar year between physicals. Physicals include all preventive health topics, screenings, medical exam and labs that are appropriate for gender/age and history. You may have fasting labs needed at this visit. This is a well visit (not a sick visit), new problems should not be covered during this visit (see acute visit).  - Pediatric patients are seen more frequently when they are younger. Your provider will advise you on well child visit timing that is appropriate for your their age. - This is not a medicare wellness visit. Medicare wellness exams do not have an exam portion to the visit. Some medicare companies allow for a physical, some do not allow a yearly physical. If your medicare allows a yearly physical you can schedule the medicare wellness with our nurse Maudie Mercury and have your physical with your provider after, on the same [place]. Please check with insurance for your full benefits.   Late Policy/No Shows:  - all new patients should arrive 15-30 minutes earlier than appointment to allow [place] time  to  obtain all personal demographics,  insurance information and for you to complete office paperwork. - All established patients should arrive 10-15 minutes earlier than appointment time to update all information and be checked in .  - In our best efforts to run on time, if you are late for your appointment you will be asked to either reschedule or if able, we will work you back into the schedule. There will be a wait time to work you back in the schedule,  depending on availability.  - If you are unable to make it to your appointment as scheduled, please call 24 hours ahead of time to allow [place] to fill the time slot with someone else who needs to be seen. If you do not cancel your appointment ahead of time, you may be charged a no show fee.

## 2016-12-16 NOTE — Progress Notes (Signed)
Madison Martinez     Patient ID: Madison Martinez, female  DOB: 1927-12-07, 83 y.o.   MRN: [id number] Patient Care Team    Relationship Specialty Notifications Start End  Madison Hillock, DO PCP - General Family Medicine  09/01/16   Madison Carmine, MD Referring Physician Internal Medicine  09/01/16   Madison Martinez, OD  Optometry  09/01/16     Chief Complaint  Patient presents with  . Hypertension  . Diabetes    Subjective:  Madison Martinez is a 83 y.o.  female present for follow up  Essential hypertension/hyperlipidemia/CKD3 Pt reports compliance with Metoprolol-XL 25 mg daily, hydralazine 25 mg TID  and lisinopril 20 mg daily.  Pt is  prescribed  a statin. She Reports her home blood pressure readings are "115 ". She reports she feels very good. Patient denies chest pain, shortness of breath, dizziness or lower extremity edema.  Patient reports she is taking her hydralazine morning, noon and evening, however she would've ran out of her prescription 10/20/2016. BMP: 09/14/2016, creatinine 1.02, GFR 50 Lipids: 09/14/2016, total cholesterol 250, could not calculate LDL secondary to triglycerides of 632. Patient reports she was fasting overnight.  CBC: 09/14/2016, WBC 11.1, absolute lymphocytosis (chronic)  Diet: Monitors her diet, low-sodium Exercise: Does not exercise much, walks with a cane RF: Hypertension, hyperlipidemia, family history of heart disease  Type 2 diabetes mellitus without complication, without long-term current use of insulin ([place]) Today patient states that she doesn't have diabetes. Patient has not been on medications for her diabetes for multiple years.  A1c was 6.9 08/05/2015. Patient states that she tries to eat healthy. A1c 3 months ago was 7.0. Patient has chronic polyneuropathy in which she uses gabapentin 600 mg twice a [place].  Monoclonal B-cell lymphocytosis: Patient has followed up with hematologist August 2018, with normal results. Follow-up is to continue every 3 months with them.  She is happy with the staff at that location. Prior note: WBC 11.1, neutrophil #[id number], lymphocyte #4773 (high). Pathological smear read by pathologist reports leukocytosis due to absolute lymphocytosis. Myeloid population consists predominantly of mature segmented neutrophils with reactive changes. A few atypical lymphs. No immature cells are identified. Red blood cells and platelets are unremarkable. It was recommended that the features could be reactive, however if absolute lymphocytosis persists suggest immunophenotyping by flow cytometry for further evaluation. Given patient's inconsistent care surrounding this condition, I recommended her being evaluated by hematologist again.   Prior note: Patient does not provide much information surrounding her lymphocytosis. She reports that they thought she had cancer, but the blood tests that she did not, so she didn't go back. Electronic medical record review under the care everywhere tab, was able to locate record that the patient was seen and hematology/oncology in [place], though [place] with Madison Martinez for leukocytosis, which the records suggest present since 2011. There are absolute lymphocyte count ranging from 5.2-5.7 over the time of 2011-2015. The patient was reported asymptomatic. Cytogenic testing was completed and showed a, kappa monocytic CD 5+ B-cell population (13% of total cells), and her absolute lymphocyte count was normal per their office notes. They recommended patient be seen every 3 months to monitor for possible progression to chronic lymphocytic leukemia, but patient did not desire follow-up secondary to monetary cost and was going to be followed by her primary care physician. Patient does not seem to completely understand the depth of the above findings, although not suggestive of leukemia at the time, should be monitored routinely for potential progression  of recommendations of the oncologist. Since 08/08/2013 - 04/18/2016: WBC  count has ranged from 9.7-12.7. Absolute lymphocytes 4.6-6.1, last being 5.0. She doesn't report any family history concerning for cancers of the lymph system. She denies any night sweats, weight loss, easy bruising or increased fatigue. Per records she has been referred to podiatry for fungal nail infection and she had a urinary tract infection, other than those incidences no reports of frequent infections.  Depression screen [facility] 2/9 09/01/2016  Decreased Interest 0  Down, Depressed, Hopeless 0  PHQ - 2 Score 0   No flowsheet data found.     Fall Risk  09/01/2016  Falls in the past year? No  Risk for fall due to : Impaired balance/gait;Impaired mobility    There is no immunization history on file for this patient.  No exam data present  Past Medical History:  Diagnosis Date  . Blind hypertensive eye, left 04/18/2012  . Gait disturbance 10/05/2015   pt declined neuro referral per records  . Hard of hearing 12/02/2015   hearing aids  . Hyperlipidemia   . Hypertension   . Insomnia    "tried melatonin, advil PM, xanax and klonopin without success"  . Leukocytosis 11/07/2013   seen oncology, did not want to continue to follow there, so continued follow up at PCP.  . Osteoporosis   . Polyneuropathy   . Primary open angle glaucoma 01/2011  . Spinal stenosis   . Stable branch retinal vein occlusion 01/06/2012  . Type 2 diabetes mellitus ([place])    "diet controlled"   Allergies  Allergen Reactions  . Amlodipine Besylate     Other reaction(s): Vertigo  . Flu Virus Vaccine Swelling   Past Surgical History:  Procedure Laterality Date  . EYE SURGERY    . [place]  2005  . TOTAL HIP ARTHROPLASTY Left 1991  . TOTAL HIP ARTHROPLASTY Right ?   x 2   Family History  Problem Relation Age of Onset  . Heart disease Brother    Social History   Social History  . Marital status: Divorced    Spouse name: N/A  . Number of children: 3  . Years of education: 12   Occupational  History  . Not on file.   Social History Main Topics  . Smoking status: Never Smoker  . Smokeless tobacco: Never Used  . Alcohol use No  . Drug use: No  . Sexual activity: No   Other Topics Concern  . Not on file   Social History Narrative   Divorced. 3 children Madison Martinez, Madison Martinez and Madison Martinez. Lives alone.   High school graduate.   Takes a daily vitamin.   Wears her seatbelt. Wears a hearing aid. Wears dentures.   Requires a cane for assistive device for walking.   Smoke detector in the home.   Feels safe in her relationships.   Allergies as of 12/16/2016      Reactions   Amlodipine Besylate    Other reaction(s): Vertigo   Flu Virus Vaccine Swelling      Medication List       Accurate as of 12/16/16 12:12 PM. Always use your most recent med list.          aspirin 81 MG EC tablet Take 1 tablet by mouth daily.   dorzolamide-timolol 22.3-6.8 MG/ML ophthalmic solution Commonly known as:  COSOPT Place 1 drop into the left eye 2 (two) times daily.   gabapentin 300 MG capsule Commonly known as:  NEURONTIN Take  2 capsules (600 mg total) by mouth 2 (two) times daily.   hydrALAZINE 25 MG tablet Commonly known as:  APRESOLINE Take 1 tablet (25 mg total) by mouth 3 (three) times daily.   latanoprost 0.005 % ophthalmic solution Commonly known as:  XALATAN Place 1 drop into the left eye at bedtime.   lisinopril 20 MG tablet Commonly known as:  PRINIVIL,ZESTRIL Take 1 tablet (20 mg total) by mouth daily.   metoprolol succinate 25 MG 24 hr tablet Commonly known as:  TOPROL-XL Take 1 tablet (25 mg total) by mouth daily.   pravastatin 10 MG tablet Commonly known as:  PRAVACHOL Take 1 tablet (10 mg total) by mouth daily.   VITAMIN D-1000 Madison Martinez ST 1000 units tablet Generic drug:  Cholecalciferol Take 2 tablets by mouth daily.            Discharge Care Instructions        Start     Ordered   12/16/16 0000  POCT glycosylated hemoglobin (Hb A1C)     12/16/16 0939    12/16/16 0000  PTH, Intact and Calcium     12/16/16 1014   12/16/16 0000  Vitamin D (25 hydroxy)     12/16/16 1014   12/16/16 1017  Basic Metabolic Panel (BMET)     12/16/16 1014      All past medical history, surgical history, allergies, family history, immunizations andmedications were updated in the EMR today and reviewed under the history and medication portions of their EMR.    Recent Results (from the past 2160 hour(s))  POCT glycosylated hemoglobin (Hb A1C)     Status: Abnormal   Collection Time: 12/16/16  9:57 AM  Result Value Ref Range   Hemoglobin A1C 6.8     No results found.   ROS: 14 pt review of systems performed and negative (unless mentioned in an HPI)  Objective: BP (!) 158/92 (BP Location: Left Arm, Patient Position: Sitting, Cuff Size: Normal)   Pulse 71   Temp 98 F (36.7 C)   Resp 20   Ht 5\' 4"  (1.626 m)   Wt 151 lb 4 oz (68.6 kg)   SpO2 96%   BMI 25.96 kg/m  Gen: Afebrile. No acute distress. Nontoxic in appearance, well-developed, well-nourished, Caucasian female. HENT: AT. [place].  MMM.  Eyes:Pupils Equal Round Reactive to light, Extraocular movements intact,  Conjunctiva without redness, discharge or icterus. CV: RRR, no edema, +2/4 P posterior tibialis pulses Chest: CTAB, no wheeze or crackles Abd: Soft. NTND. BS present.  Neuro: Walks with a cane, unchanged gait.  PERLA. EOMi. Alert. Oriented x3.  Psych: Normal affect, dress and demeanor. Normal speech. Normal thought content and judgment.   Assessment/plan: Madison Martinez is a 83 y.o. female present follow up  after recent establishment of care. Essential hypertension/hyperlipidemia/CKD3 - Blood pressure elevated today on initial and repeat. Patient states that she takes her blood pressure medications every morning. She is unable to explain to me her home reading, I'm uncertain if "510 "is systolic or diastolic. Opted not to change medications today. I'm uncertain what her home blood pressures are, and if  she's taking her medications as directed. She is with her son today, although he does not live with her. Therefore we decided to have home health care involved to have blood pressure checks and medication assistance. - Continue Toprol XL 25 mg daily, lisinopril 20 mg daily. Home health nurse will be sent to monitor blood pressures at home and check medications. If patient  is not taking the hydralazine, which she should have ran out of medication over 2 months ago, and her blood pressures are elevated, I would rather maximize lisinopril therapy to decrease meds and illnesses. - She will continue to monitor her blood pressures at home, goal < 135/85 - Continue pravastatin, no refills needed at this time. Discussed her rather elevated triglycerides. Patient does not want to start another medication, such as fenofibrate. Recommended to at least try fish oil 2 g daily, and she is agreeable to do that daily. Recheck cholesterol next visit (fasting) this was explained to patient today, she is aware to make a fasting appointment. - BMP 09/14/2016: GFR 50, creatinine 1.02, monitor every 6 months. - Calcium/PTH/vitamin D yearly can collect next visit. - Follow-up 3 months, sooner if home health nurse reports uncontrolled hypertension at home.  Type 2 diabetes mellitus without complication, without long-term current use of insulin (HCC) A1c 7.0 --> 6.8 today.  Given her age, I wouldn't start medications on her unless above 7.0. Patient to monitor her diet more closely, and try to get some exercises if able. - Recheck in 3-6 months   Return in about 3 months (around 03/27/2017) for HTN, HLD, DM.   Electronically signed by: Howard Pouch, DO [place]

## 2016-12-19 LAB — PTH, INTACT AND CALCIUM
CALCIUM: 9.7 mg/dL (ref 8.6–10.4)
PTH: 52 pg/mL (ref 14–64)

## 2016-12-19 LAB — VITAMIN D 25 HYDROXY (VIT D DEFICIENCY, FRACTURES): VIT D 25 HYDROXY: 45 ng/mL (ref 30–100)

## 2016-12-26 ENCOUNTER — Telehealth: Payer: Self-pay | Admitting: Family Medicine

## 2016-12-26 NOTE — Telephone Encounter (Signed)
Pt calling to request refill of dorzolamide-timolol (COSOPT) 22.3-6.8 MG/ML ophthalmic solution.  Per patient, please fax to [phone], [facility].

## 2016-12-26 NOTE — Telephone Encounter (Signed)
Spoke with patient we do not refill her eyedrops she needs to go through her eye Dr at [facility] she says she will call them.

## 2016-12-27 DIAGNOSIS — E1142 Type 2 diabetes mellitus with diabetic polyneuropathy: Secondary | ICD-10-CM | POA: Diagnosis not present

## 2016-12-27 DIAGNOSIS — Z7982 Long term (current) use of aspirin: Secondary | ICD-10-CM | POA: Diagnosis not present

## 2016-12-27 DIAGNOSIS — E1122 Type 2 diabetes mellitus with diabetic chronic kidney disease: Secondary | ICD-10-CM | POA: Diagnosis not present

## 2016-12-27 DIAGNOSIS — N183 Chronic kidney disease, stage 3 (moderate): Secondary | ICD-10-CM | POA: Diagnosis not present

## 2016-12-27 DIAGNOSIS — I129 Hypertensive chronic kidney disease with stage 1 through stage 4 chronic kidney disease, or unspecified chronic kidney disease: Secondary | ICD-10-CM | POA: Diagnosis not present

## 2016-12-27 DIAGNOSIS — H5462 Unqualified visual loss, left eye, normal vision right eye: Secondary | ICD-10-CM | POA: Diagnosis not present

## 2016-12-27 DIAGNOSIS — M810 Age-related osteoporosis without current pathological fracture: Secondary | ICD-10-CM | POA: Diagnosis not present

## 2016-12-27 DIAGNOSIS — H9190 Unspecified hearing loss, unspecified ear: Secondary | ICD-10-CM | POA: Diagnosis not present

## 2016-12-31 DIAGNOSIS — Z7982 Long term (current) use of aspirin: Secondary | ICD-10-CM | POA: Diagnosis not present

## 2016-12-31 DIAGNOSIS — E1122 Type 2 diabetes mellitus with diabetic chronic kidney disease: Secondary | ICD-10-CM | POA: Diagnosis not present

## 2016-12-31 DIAGNOSIS — H5462 Unqualified visual loss, left eye, normal vision right eye: Secondary | ICD-10-CM | POA: Diagnosis not present

## 2016-12-31 DIAGNOSIS — H9190 Unspecified hearing loss, unspecified ear: Secondary | ICD-10-CM | POA: Diagnosis not present

## 2016-12-31 DIAGNOSIS — M810 Age-related osteoporosis without current pathological fracture: Secondary | ICD-10-CM | POA: Diagnosis not present

## 2016-12-31 DIAGNOSIS — E1142 Type 2 diabetes mellitus with diabetic polyneuropathy: Secondary | ICD-10-CM | POA: Diagnosis not present

## 2016-12-31 DIAGNOSIS — I129 Hypertensive chronic kidney disease with stage 1 through stage 4 chronic kidney disease, or unspecified chronic kidney disease: Secondary | ICD-10-CM | POA: Diagnosis not present

## 2016-12-31 DIAGNOSIS — N183 Chronic kidney disease, stage 3 (moderate): Secondary | ICD-10-CM | POA: Diagnosis not present

## 2017-01-03 DIAGNOSIS — I129 Hypertensive chronic kidney disease with stage 1 through stage 4 chronic kidney disease, or unspecified chronic kidney disease: Secondary | ICD-10-CM | POA: Diagnosis not present

## 2017-01-03 DIAGNOSIS — Z7982 Long term (current) use of aspirin: Secondary | ICD-10-CM | POA: Diagnosis not present

## 2017-01-03 DIAGNOSIS — E1122 Type 2 diabetes mellitus with diabetic chronic kidney disease: Secondary | ICD-10-CM | POA: Diagnosis not present

## 2017-01-03 DIAGNOSIS — N183 Chronic kidney disease, stage 3 (moderate): Secondary | ICD-10-CM | POA: Diagnosis not present

## 2017-01-03 DIAGNOSIS — E1142 Type 2 diabetes mellitus with diabetic polyneuropathy: Secondary | ICD-10-CM | POA: Diagnosis not present

## 2017-01-03 DIAGNOSIS — H5462 Unqualified visual loss, left eye, normal vision right eye: Secondary | ICD-10-CM | POA: Diagnosis not present

## 2017-01-03 DIAGNOSIS — H9190 Unspecified hearing loss, unspecified ear: Secondary | ICD-10-CM | POA: Diagnosis not present

## 2017-01-03 DIAGNOSIS — M810 Age-related osteoporosis without current pathological fracture: Secondary | ICD-10-CM | POA: Diagnosis not present

## 2017-01-04 DIAGNOSIS — M810 Age-related osteoporosis without current pathological fracture: Secondary | ICD-10-CM | POA: Diagnosis not present

## 2017-01-04 DIAGNOSIS — H5462 Unqualified visual loss, left eye, normal vision right eye: Secondary | ICD-10-CM | POA: Diagnosis not present

## 2017-01-04 DIAGNOSIS — Z7982 Long term (current) use of aspirin: Secondary | ICD-10-CM | POA: Diagnosis not present

## 2017-01-04 DIAGNOSIS — H9190 Unspecified hearing loss, unspecified ear: Secondary | ICD-10-CM | POA: Diagnosis not present

## 2017-01-06 DIAGNOSIS — Z7982 Long term (current) use of aspirin: Secondary | ICD-10-CM | POA: Diagnosis not present

## 2017-01-06 DIAGNOSIS — E1142 Type 2 diabetes mellitus with diabetic polyneuropathy: Secondary | ICD-10-CM | POA: Diagnosis not present

## 2017-01-06 DIAGNOSIS — H5462 Unqualified visual loss, left eye, normal vision right eye: Secondary | ICD-10-CM | POA: Diagnosis not present

## 2017-01-06 DIAGNOSIS — E1122 Type 2 diabetes mellitus with diabetic chronic kidney disease: Secondary | ICD-10-CM | POA: Diagnosis not present

## 2017-01-06 DIAGNOSIS — I129 Hypertensive chronic kidney disease with stage 1 through stage 4 chronic kidney disease, or unspecified chronic kidney disease: Secondary | ICD-10-CM | POA: Diagnosis not present

## 2017-01-06 DIAGNOSIS — H9190 Unspecified hearing loss, unspecified ear: Secondary | ICD-10-CM | POA: Diagnosis not present

## 2017-01-06 DIAGNOSIS — N183 Chronic kidney disease, stage 3 (moderate): Secondary | ICD-10-CM | POA: Diagnosis not present

## 2017-01-06 DIAGNOSIS — M810 Age-related osteoporosis without current pathological fracture: Secondary | ICD-10-CM | POA: Diagnosis not present

## 2017-01-11 DIAGNOSIS — I129 Hypertensive chronic kidney disease with stage 1 through stage 4 chronic kidney disease, or unspecified chronic kidney disease: Secondary | ICD-10-CM | POA: Diagnosis not present

## 2017-01-11 DIAGNOSIS — H5462 Unqualified visual loss, left eye, normal vision right eye: Secondary | ICD-10-CM | POA: Diagnosis not present

## 2017-01-11 DIAGNOSIS — N183 Chronic kidney disease, stage 3 (moderate): Secondary | ICD-10-CM | POA: Diagnosis not present

## 2017-01-11 DIAGNOSIS — E1142 Type 2 diabetes mellitus with diabetic polyneuropathy: Secondary | ICD-10-CM | POA: Diagnosis not present

## 2017-01-11 DIAGNOSIS — E1122 Type 2 diabetes mellitus with diabetic chronic kidney disease: Secondary | ICD-10-CM | POA: Diagnosis not present

## 2017-01-11 DIAGNOSIS — M810 Age-related osteoporosis without current pathological fracture: Secondary | ICD-10-CM | POA: Diagnosis not present

## 2017-01-11 DIAGNOSIS — H9190 Unspecified hearing loss, unspecified ear: Secondary | ICD-10-CM | POA: Diagnosis not present

## 2017-01-11 DIAGNOSIS — Z7982 Long term (current) use of aspirin: Secondary | ICD-10-CM | POA: Diagnosis not present

## 2017-01-16 DIAGNOSIS — I129 Hypertensive chronic kidney disease with stage 1 through stage 4 chronic kidney disease, or unspecified chronic kidney disease: Secondary | ICD-10-CM | POA: Diagnosis not present

## 2017-01-16 DIAGNOSIS — E1122 Type 2 diabetes mellitus with diabetic chronic kidney disease: Secondary | ICD-10-CM | POA: Diagnosis not present

## 2017-01-16 DIAGNOSIS — M810 Age-related osteoporosis without current pathological fracture: Secondary | ICD-10-CM | POA: Diagnosis not present

## 2017-01-16 DIAGNOSIS — E1142 Type 2 diabetes mellitus with diabetic polyneuropathy: Secondary | ICD-10-CM | POA: Diagnosis not present

## 2017-01-16 DIAGNOSIS — H9190 Unspecified hearing loss, unspecified ear: Secondary | ICD-10-CM | POA: Diagnosis not present

## 2017-01-16 DIAGNOSIS — N183 Chronic kidney disease, stage 3 (moderate): Secondary | ICD-10-CM | POA: Diagnosis not present

## 2017-01-16 DIAGNOSIS — H5462 Unqualified visual loss, left eye, normal vision right eye: Secondary | ICD-10-CM | POA: Diagnosis not present

## 2017-01-16 DIAGNOSIS — Z7982 Long term (current) use of aspirin: Secondary | ICD-10-CM | POA: Diagnosis not present

## 2017-01-25 DIAGNOSIS — M810 Age-related osteoporosis without current pathological fracture: Secondary | ICD-10-CM | POA: Diagnosis not present

## 2017-01-25 DIAGNOSIS — Z7982 Long term (current) use of aspirin: Secondary | ICD-10-CM | POA: Diagnosis not present

## 2017-01-25 DIAGNOSIS — N183 Chronic kidney disease, stage 3 (moderate): Secondary | ICD-10-CM | POA: Diagnosis not present

## 2017-01-25 DIAGNOSIS — E1142 Type 2 diabetes mellitus with diabetic polyneuropathy: Secondary | ICD-10-CM | POA: Diagnosis not present

## 2017-01-25 DIAGNOSIS — E1122 Type 2 diabetes mellitus with diabetic chronic kidney disease: Secondary | ICD-10-CM | POA: Diagnosis not present

## 2017-01-25 DIAGNOSIS — H9190 Unspecified hearing loss, unspecified ear: Secondary | ICD-10-CM | POA: Diagnosis not present

## 2017-01-25 DIAGNOSIS — I129 Hypertensive chronic kidney disease with stage 1 through stage 4 chronic kidney disease, or unspecified chronic kidney disease: Secondary | ICD-10-CM | POA: Diagnosis not present

## 2017-01-25 DIAGNOSIS — H5462 Unqualified visual loss, left eye, normal vision right eye: Secondary | ICD-10-CM | POA: Diagnosis not present

## 2017-02-08 DIAGNOSIS — I129 Hypertensive chronic kidney disease with stage 1 through stage 4 chronic kidney disease, or unspecified chronic kidney disease: Secondary | ICD-10-CM | POA: Diagnosis not present

## 2017-02-08 DIAGNOSIS — N183 Chronic kidney disease, stage 3 (moderate): Secondary | ICD-10-CM | POA: Diagnosis not present

## 2017-02-08 DIAGNOSIS — E1142 Type 2 diabetes mellitus with diabetic polyneuropathy: Secondary | ICD-10-CM | POA: Diagnosis not present

## 2017-02-08 DIAGNOSIS — Z7982 Long term (current) use of aspirin: Secondary | ICD-10-CM | POA: Diagnosis not present

## 2017-02-08 DIAGNOSIS — H9190 Unspecified hearing loss, unspecified ear: Secondary | ICD-10-CM | POA: Diagnosis not present

## 2017-02-08 DIAGNOSIS — M810 Age-related osteoporosis without current pathological fracture: Secondary | ICD-10-CM | POA: Diagnosis not present

## 2017-02-08 DIAGNOSIS — E1122 Type 2 diabetes mellitus with diabetic chronic kidney disease: Secondary | ICD-10-CM | POA: Diagnosis not present

## 2017-02-08 DIAGNOSIS — H5462 Unqualified visual loss, left eye, normal vision right eye: Secondary | ICD-10-CM | POA: Diagnosis not present

## 2017-02-20 DIAGNOSIS — M810 Age-related osteoporosis without current pathological fracture: Secondary | ICD-10-CM | POA: Diagnosis not present

## 2017-02-20 DIAGNOSIS — H9190 Unspecified hearing loss, unspecified ear: Secondary | ICD-10-CM | POA: Diagnosis not present

## 2017-02-20 DIAGNOSIS — N183 Chronic kidney disease, stage 3 (moderate): Secondary | ICD-10-CM | POA: Diagnosis not present

## 2017-02-20 DIAGNOSIS — Z7982 Long term (current) use of aspirin: Secondary | ICD-10-CM | POA: Diagnosis not present

## 2017-02-20 DIAGNOSIS — I129 Hypertensive chronic kidney disease with stage 1 through stage 4 chronic kidney disease, or unspecified chronic kidney disease: Secondary | ICD-10-CM | POA: Diagnosis not present

## 2017-02-20 DIAGNOSIS — E1122 Type 2 diabetes mellitus with diabetic chronic kidney disease: Secondary | ICD-10-CM | POA: Diagnosis not present

## 2017-02-20 DIAGNOSIS — E1142 Type 2 diabetes mellitus with diabetic polyneuropathy: Secondary | ICD-10-CM | POA: Diagnosis not present

## 2017-02-20 DIAGNOSIS — H5462 Unqualified visual loss, left eye, normal vision right eye: Secondary | ICD-10-CM | POA: Diagnosis not present

## 2017-03-17 DIAGNOSIS — H401111 Primary open-angle glaucoma, right eye, mild stage: Secondary | ICD-10-CM | POA: Diagnosis not present

## 2017-03-17 DIAGNOSIS — H4052X3 Glaucoma secondary to other eye disorders, left eye, severe stage: Secondary | ICD-10-CM | POA: Diagnosis not present

## 2017-03-27 ENCOUNTER — Ambulatory Visit: Payer: Medicare HMO | Admitting: Family Medicine

## 2017-03-28 ENCOUNTER — Ambulatory Visit (INDEPENDENT_AMBULATORY_CARE_PROVIDER_SITE_OTHER): Payer: PPO | Admitting: Family Medicine

## 2017-03-28 ENCOUNTER — Encounter: Payer: Self-pay | Admitting: Family Medicine

## 2017-03-28 ENCOUNTER — Telehealth: Payer: Self-pay | Admitting: Family Medicine

## 2017-03-28 VITALS — BP 151/77 | HR 70 | Temp 97.5°F | Wt 151.8 lb

## 2017-03-28 DIAGNOSIS — E785 Hyperlipidemia, unspecified: Secondary | ICD-10-CM

## 2017-03-28 DIAGNOSIS — E119 Type 2 diabetes mellitus without complications: Secondary | ICD-10-CM

## 2017-03-28 DIAGNOSIS — G629 Polyneuropathy, unspecified: Secondary | ICD-10-CM

## 2017-03-28 DIAGNOSIS — I10 Essential (primary) hypertension: Secondary | ICD-10-CM | POA: Diagnosis not present

## 2017-03-28 DIAGNOSIS — N183 Chronic kidney disease, stage 3 unspecified: Secondary | ICD-10-CM

## 2017-03-28 LAB — BASIC METABOLIC PANEL
BUN: 16 mg/dL (ref 6–23)
CALCIUM: 10 mg/dL (ref 8.4–10.5)
CO2: 28 mEq/L (ref 19–32)
Chloride: 101 mEq/L (ref 96–112)
Creatinine, Ser: 0.96 mg/dL (ref 0.40–1.20)
GFR: 58.34 mL/min — AB (ref >60.00)
Glucose, Bld: 171 mg/dL — ABNORMAL HIGH (ref 70–99)
Potassium: 4.3 mEq/L (ref 3.5–5.1)
SODIUM: 137 meq/L (ref 135–145)

## 2017-03-28 LAB — POCT GLYCOSYLATED HEMOGLOBIN (HGB A1C): Hemoglobin A1C: 6.5

## 2017-03-28 MED ORDER — GABAPENTIN 300 MG PO CAPS: 600.0000 mg | ORAL_CAPSULE | Freq: Two times a day (BID) | ORAL | 5 refills | Status: DC

## 2017-03-28 MED ORDER — METOPROLOL SUCCINATE ER 25 MG PO TB24: 25.0000 mg | ORAL_TABLET | Freq: Every day | ORAL | 1 refills | Status: DC

## 2017-03-28 MED ORDER — LISINOPRIL 20 MG PO TABS: 20.0000 mg | ORAL_TABLET | Freq: Every day | ORAL | 1 refills | Status: DC

## 2017-03-28 MED ORDER — PRAVASTATIN SODIUM 10 MG PO TABS: 10.0000 mg | ORAL_TABLET | Freq: Every day | ORAL | 3 refills | Status: DC

## 2017-03-28 MED ORDER — HYDRALAZINE HCL 25 MG PO TABS: 25.0000 mg | ORAL_TABLET | Freq: Three times a day (TID) | ORAL | 1 refills | Status: DC

## 2017-03-28 NOTE — Progress Notes (Signed)
Madison Martinez     Patient ID: Madison Martinez, female  DOB: 1927/04/27, 84 y.o.   MRN: [id number] Patient Care Team    Relationship Specialty Notifications Start End  Ma Hillock, DO PCP - General Family Medicine  09/01/16   Verdell Carmine, MD Referring Physician Internal Medicine  09/01/16   Renelda Loma, OD  Optometry  09/01/16     Chief Complaint  Patient presents with  . Hypertension    follow up  . Hyperlipidemia    follow up  . Diabetes    follow up    Subjective:  Madison Martinez is a 84 y.o.  female present for follow up  Essential hypertension/hyperlipidemia/CKD3 Pt reports compliance with Metoprolol-XL 25 mg daily, hydralazine 25 mg TID  and lisinopril 20 mg daily.  Patient denies chest pain, shortness of breath, dizziness or lower extremity edema. She is taking statin and has added Krill oil.  BMP: 09/14/2016, creatinine 1.02, GFR 50 Lipids: 09/14/2016, total cholesterol 250, could not calculate LDL secondary to triglycerides of 632. Patient reports she was fasting overnight.  CBC: 09/14/2016, WBC 11.1, absolute lymphocytosis (chronic)  Diet: Monitors her diet, low-sodium Exercise: Does not exercise much, walks with a cane RF: Hypertension, hyperlipidemia, family history of heart disease  Type 2 diabetes mellitus without complication, without long-term current use of insulin ([place]) Patient has been a diet-controlled diabetic for many years. She has not needed medications for some time. Her A1c trend has been 7.0--> 6.8--> 6.5 today. Patient has chronic polyneuropathy in which she uses gabapentin 600 mg twice a [place]. She reports that medication is working well for her. She denies any nonhealing wounds. Allergic to flu vac. Declined PNA in the past.    Depression screen [facility] 2/9 09/01/2016  Decreased Interest 0  Down, Depressed, Hopeless 0  PHQ - 2 Score 0   No flowsheet data found.     Fall Risk  09/01/2016  Falls in the past year? No  Risk for fall due to : Impaired  balance/gait;Impaired mobility    There is no immunization history on file for this patient.  No exam data present  Past Medical History:  Diagnosis Date  . Blind hypertensive eye, left 04/18/2012  . Gait disturbance 10/05/2015   pt declined neuro referral per records  . Hard of hearing 12/02/2015   hearing aids  . Hyperlipidemia   . Hypertension   . Insomnia    "tried melatonin, advil PM, xanax and klonopin without success"  . Leukocytosis 11/07/2013   seen oncology, did not want to continue to follow there, so continued follow up at PCP.  . Osteoporosis   . Polyneuropathy   . Primary open angle glaucoma 01/2011  . Spinal stenosis   . Stable branch retinal vein occlusion 01/06/2012  . Type 2 diabetes mellitus ([place])    "diet controlled"   Allergies  Allergen Reactions  . Amlodipine Besylate     Other reaction(s): Vertigo  . Flu Virus Vaccine Swelling   Past Surgical History:  Procedure Laterality Date  . EYE SURGERY    . [place]  2005  . TOTAL HIP ARTHROPLASTY Left 1991  . TOTAL HIP ARTHROPLASTY Right ?   x 2   Family History  Problem Relation Age of Onset  . Heart disease Brother    Social History   Socioeconomic History  . Marital status: Divorced    Spouse name: Not on file  . Number of children: 3  . Years of education: 13  .  Highest education level: Not on file  Social Needs  . Financial resource strain: Not on file  . Food insecurity - worry: Not on file  . Food insecurity - inability: Not on file  . Transportation needs - medical: Not on file  . Transportation needs - non-medical: Not on file  Occupational History  . Not on file  Tobacco Use  . Smoking status: Never Smoker  . Smokeless tobacco: Never Used  Substance and Sexual Activity  . Alcohol use: No  . Drug use: No  . Sexual activity: No  Other Topics Concern  . Not on file  Social History Narrative   Divorced. 3 children [name], Max and [name]. Lives alone.   High school  graduate.   Takes a daily vitamin.   Wears her seatbelt. Wears a hearing aid. Wears dentures.   Requires a cane for assistive device for walking.   Smoke detector in the home.   Feels safe in her relationships.   Allergies as of 03/28/2017      Reactions   Amlodipine Besylate    Other reaction(s): Vertigo   Flu Virus Vaccine Swelling      Medication List        Accurate as of 03/28/17  9:49 AM. Always use your most recent med list.          aspirin 81 MG EC tablet Take 1 tablet by mouth daily.   dorzolamide-timolol 22.3-6.8 MG/ML ophthalmic solution Commonly known as:  COSOPT Place 1 drop into the right eye 2 (two) times daily.   gabapentin 300 MG capsule Commonly known as:  NEURONTIN Take 2 capsules (600 mg total) by mouth 2 (two) times daily.   hydrALAZINE 25 MG tablet Commonly known as:  APRESOLINE Take 1 tablet (25 mg total) by mouth every 8 (eight) hours.   KRILL OIL PO Take by mouth.   latanoprost 0.005 % ophthalmic solution Commonly known as:  XALATAN Place 1 drop into the left eye at bedtime.   lisinopril 20 MG tablet Commonly known as:  PRINIVIL,ZESTRIL Take 1 tablet (20 mg total) by mouth daily.   metoprolol succinate 25 MG 24 hr tablet Commonly known as:  TOPROL-XL Take 1 tablet (25 mg total) by mouth daily.   pravastatin 10 MG tablet Commonly known as:  PRAVACHOL Take 1 tablet (10 mg total) by mouth daily.   VITAMIN D-1000 MAX ST 1000 units tablet Generic drug:  Cholecalciferol Take 2 tablets by mouth daily.       All past medical history, surgical history, allergies, family history, immunizations andmedications were updated in the EMR today and reviewed under the history and medication portions of their EMR.    Recent Results (from the past 2160 hour(s))  POCT glycosylated hemoglobin (Hb A1C)     Status: Abnormal   Collection Time: 03/28/17  9:39 AM  Result Value Ref Range   Hemoglobin A1C 6.5     No results found.   ROS: 14 pt review  of systems performed and negative (unless mentioned in an HPI)  Objective: BP (!) 151/77 (BP Location: Left Arm, Patient Position: Sitting, Cuff Size: Normal)   Pulse 70   Temp (!) 97.5 F (36.4 C) (Oral)   Wt 151 lb 12.8 oz (68.9 kg)   SpO2 95%   BMI 26.06 kg/m   Gen: Afebrile. No acute distress. Non-toxic in appearance, well developed, well nourished HOH.  HENT: AT. [place]. Bilateral TM visualized and normal in appearance. MMM.  Eyes:Pupils Equal Round Reactive to  light, Extraocular movements intact,  Conjunctiva without redness, discharge or icterus. Neck/lymp/endocrine: Supple,no lymphadenopathy, no thyromegaly CV: RRR no murmur, no edema, +2/4 P posterior tibialis pulses Chest: CTAB, no wheeze or crackles Skin: no rashes, purpura or petechiae.  Neuro: walks with cane or minimal assistance. PERLA. EOMi. Alert. Oriented x3    Assessment/plan: Madison Martinez is a 84 y.o. female present follow up  Essential hypertension/hyperlipidemia/CKD3 - [place] assistance was provided last visit for Blood pressure recheck and med evaluation. No feedback was provided from home health. - she did not come fasting today.  - she reports taking the hydralazine, although I do not feel she would have had enough. We had discussed discontinuing and maximizing therapy with lisinopril increase, however given I still do not know exactly what she is taking, will stay with regimen and add back hydralazine.  - Continue Toprol XL 25 mg daily, lisinopril 20 mg daily and Hydralazine 25 mg TID.  - She will continue to monitor her blood pressures at home, goal < 135/85 - Continue pravastatin and krill oil.  - BMP 09/14/2016: GFR 50, creatinine 1.02, monitor every 6 months.--> completed today.  - Calcium/PTH/vitamin D yearly can collected today - Follow-up 6 months, sooner if needed  Type 2 diabetes mellitus without complication, without long-term current use of insulin (HCC) A1c 7.0 --> 6.8 --> 6.5 today. Continue dietary  modification.  Foot exam completed today.  -  6 months f/u--> Will offer PNA series again next visit given her immunization status is unknown and she is a diet controlled DM > 65.   Return in about 6 months (around 09/25/2017).   Electronically signed by: Howard Pouch, DO [place]

## 2017-03-28 NOTE — Patient Instructions (Addendum)
I am glad you are doing well.  I have refilled your medications we provide you.   Please make sure you are taking the Hydralazine every 8 hours.    Decrease the amount of salt you use.  Followup in 6 months.

## 2017-03-28 NOTE — Telephone Encounter (Signed)
Copied from [place] [id number]. Topic: Quick Communication - Rx Refill/Question >> Mar 28, 2017 10:23 AM Robina Ade, Helene Kelp D wrote: Has the patient contacted their pharmacy? Yes (Agent: If no, request that the patient contact the pharmacy for the refill.) Preferred Pharmacy (with phone number or street name): [facility] [place], [place] - [address] Agent: Please be advised that RX refills may take up to 3 business days. We ask that you follow-up with your pharmacy. Patient son Legrand Como called and said that patients gabapentin (NEURONTIN) 300 MG capsule was not sent and she needs refill on it. Please send to pharmacy, thanks.

## 2017-03-28 NOTE — Telephone Encounter (Signed)
Refill was sent by Dr Raoul Pitch today.

## 2017-03-29 LAB — PTH, INTACT AND CALCIUM
Calcium: 10.3 mg/dL (ref 8.6–10.4)
PTH: 40 pg/mL (ref 14–64)

## 2017-06-16 ENCOUNTER — Ambulatory Visit (INDEPENDENT_AMBULATORY_CARE_PROVIDER_SITE_OTHER): Payer: PPO

## 2017-06-16 ENCOUNTER — Other Ambulatory Visit: Payer: Self-pay

## 2017-06-16 VITALS — BP 144/78 | HR 63 | Ht 64.0 in | Wt 151.4 lb

## 2017-06-16 DIAGNOSIS — Z0000 Encounter for general adult medical examination without abnormal findings: Secondary | ICD-10-CM

## 2017-06-16 NOTE — Progress Notes (Addendum)
Subjective:   Madison Martinez is a 84 y.o. female who presents for Medicare Annual (Subsequent) preventive examination, accompanied by son, Madison Martinez.   Review of Systems:  No ROS.  Medicare Wellness Visit. Additional risk factors are reflected in the social history.  Cardiac Risk Factors include: advanced age (>74men, >13 women);diabetes mellitus;dyslipidemia;hypertension;family history of premature cardiovascular disease   Sleep patterns: Sleeps 8-9 hours.  Home Safety/Smoke Alarms: Feels safe in home. Smoke alarms in place.  Living environment; residence and Firearm Safety: Lives alone in 1 story home. Family close.   Seat Belt Safety/Bike Helmet: Wears seat belt.   Female:   Pap-N/A       Mammo-Pt reports last > 1 year. Pt reports normal. Declines further testing.        Dexa scan-Pt reports < 2 years. Pt reports normal. ?[facility].        CCS-None.      Objective:     Vitals: BP (!) 144/78 (BP Location: Left Arm, Patient Position: Sitting, Cuff Size: Normal)   Pulse 63   Ht 5\' 4"  (1.626 m)   Wt 151 lb 6.4 oz (68.7 kg)   SpO2 97%   BMI 25.99 kg/m   Body mass index is 25.99 kg/m.  Advanced Directives 06/16/2017  Does Patient Have a Medical Advance Directive? Yes  Type of Advance Directive Madison Martinez in Chart? No - copy requested    Tobacco Social History   Tobacco Use  Smoking Status Never Smoker  Smokeless Tobacco Never Used     Counseling given: Not Answered    Past Medical History:  Diagnosis Date  . Blind hypertensive eye, left 04/18/2012  . Gait disturbance 10/05/2015   pt declined neuro referral per records  . Hard of hearing 12/02/2015   hearing aids  . Hyperlipidemia   . Hypertension   . Insomnia    "tried melatonin, advil PM, xanax and klonopin without success"  . Leukocytosis 11/07/2013   seen oncology, did not want to continue to follow there, so continued follow up at PCP.  . Osteoporosis   .  Polyneuropathy   . Primary open angle glaucoma 01/2011  . Spinal stenosis   . Stable branch retinal vein occlusion 01/06/2012  . Type 2 diabetes mellitus (Madison Martinez)    "diet controlled"   Past Surgical History:  Procedure Laterality Date  . EYE SURGERY    . Madison Martinez  2005  . TOTAL HIP ARTHROPLASTY Left 1991  . TOTAL HIP ARTHROPLASTY Right ?   x 2   Family History  Problem Relation Age of Onset  . Heart disease Brother    Social History   Socioeconomic History  . Marital status: Divorced    Spouse name: Not on file  . Number of children: 3  . Years of education: 12  . Highest education level: Not on file  Occupational History  . Not on file  Social Needs  . Financial resource strain: Not on file  . Food insecurity:    Worry: Not on file    Inability: Not on file  . Transportation needs:    Medical: Not on file    Non-medical: Not on file  Tobacco Use  . Smoking status: Never Smoker  . Smokeless tobacco: Never Used  Substance and Sexual Activity  . Alcohol use: No  . Drug use: No  . Sexual activity: Never  Lifestyle  . Physical activity:    Days per week: Not  on file    Minutes per session: Not on file  . Stress: Not on file  Relationships  . Social connections:    Talks on phone: Not on file    Gets together: Not on file    Attends religious service: Not on file    Active member of club or organization: Not on file    Attends meetings of clubs or organizations: Not on file    Relationship status: Not on file  Other Topics Concern  . Not on file  Social History Narrative   Divorced. 3 children Madison Martinez, Madison Martinez and Madison Martinez. Lives alone.   High school graduate.   Takes a daily vitamin.   Wears her seatbelt. Wears a hearing aid. Wears dentures.   Requires a cane for assistive device for walking.   Smoke detector in the home.   Feels safe in her relationships.    Outpatient Encounter Medications as of 06/16/2017  Medication Sig  . aspirin 81 MG EC tablet Take  1 tablet by mouth daily.  . Cholecalciferol (VITAMIN D-1000 Madison Martinez ST) 1000 units tablet Take 2 tablets by mouth daily.  . dorzolamide-timolol (COSOPT) 22.3-6.8 MG/ML ophthalmic solution Place 1 drop into the right eye 2 (two) times daily.   Marland Kitchen gabapentin (NEURONTIN) 300 MG capsule Take 2 capsules (600 mg total) by mouth 2 (two) times daily.  . hydrALAZINE (APRESOLINE) 25 MG tablet Take 1 tablet (25 mg total) by mouth every 8 (eight) hours.  Marland Kitchen KRILL OIL PO Take by mouth.  . latanoprost (XALATAN) 0.005 % ophthalmic solution Place 1 drop into the left eye at bedtime.   Marland Kitchen lisinopril (PRINIVIL,ZESTRIL) 20 MG tablet Take 1 tablet (20 mg total) by mouth daily.  Marland Kitchen MELATONIN PO Take by mouth.  . metoprolol succinate (TOPROL-XL) 25 MG 24 hr tablet Take 1 tablet (25 mg total) by mouth daily.  . pravastatin (PRAVACHOL) 10 MG tablet Take 1 tablet (10 mg total) by mouth daily.   No facility-administered encounter medications on file as of 06/16/2017.     Activities of Daily Living In your present state of health, do you have any difficulty performing the following activities: 06/16/2017  Hearing? Y  Vision? N  Difficulty concentrating or making decisions? N  Walking or climbing stairs? Y  Dressing or bathing? N  Doing errands, shopping? N  Preparing Food and eating ? N  Using the Toilet? N  In the past six months, have you accidently leaked urine? N  Do you have problems with loss of bowel control? N  Managing your Medications? N  Managing your Finances? N  Housekeeping or managing your Housekeeping? N  Some recent data might be hidden    Patient Care Team: Madison Hillock, DO as PCP - General (Family Medicine) Verdell Carmine, MD as Referring Physician (Internal Medicine) Renelda Loma, OD (Optometry) [facility] (Ophthalmology) Manuela Neptune, DPM as Referring Physician (Podiatry)    Assessment:   This is a routine wellness examination for Madison Martinez.  Exercise Activities and  Dietary recommendations Current Exercise Habits: The patient does not participate in regular exercise at present(Maintains household), Exercise limited by: orthopedic condition(s)   Diet (meal preparation, eat out, water intake, caffeinated beverages, dairy products, fruits and vegetables): Drinks water and orange drink.   Breakfast: cereal, fruit;coffee occasionally (decaf) Lunch: sandwich (chicken salad, [place]) Dinner: Frozen dinners   Goals    . Patient Stated     Maintain current health.        Fall Risk  Fall Risk  06/16/2017 09/01/2016  Falls in the past year? No No  Risk for fall due to : - Impaired balance/gait;Impaired mobility    Depression Screen PHQ 2/9 Scores 06/16/2017 09/01/2016  PHQ - 2 Score 0 0     Cognitive Function MMSE - Mini Mental State Exam 06/16/2017  Orientation to time 4  Orientation to Place 5  Registration 3  Attention/ Calculation 2  Recall 1  Language- name 2 objects 2  Language- repeat 1  Language- follow 3 step command 3  Language- read & follow direction 1  Write a sentence 1  Copy design 1  Total score 24          There is no immunization history on file for this patient.  Screening Tests Health Maintenance  Topic Date Due  . TETANUS/TDAP  08/02/1948  . DEXA SCAN  08/03/1994  . PNA vac Low Risk Adult (1 of 2 - PCV13) 08/03/1994  . OPHTHALMOLOGY EXAM  08/03/2017  . HEMOGLOBIN A1C  09/25/2017  . FOOT EXAM  03/28/2018        Plan:    Bring a copy of your living will and/or healthcare power of attorney to your next office visit.  Continue doing brain stimulating activities (puzzles, reading, adult coloring books, staying active) to keep memory sharp.   I have personally reviewed and noted the following in the patient's chart:   . Medical and social history . Use of alcohol, tobacco or illicit drugs  . Current medications and supplements . Functional ability and status . Nutritional status . Physical  activity . Advanced directives . List of other physicians . Hospitalizations, surgeries, and ER visits in previous 12 months . Vitals . Screenings to include cognitive, depression, and falls . Referrals and appointments  In addition, I have reviewed and discussed with patient certain preventive protocols, quality metrics, and best practice recommendations. A written personalized care plan for preventive services as well as general preventive health recommendations were provided to patient.     Gerilyn Nestle, RN  06/16/2017  PCP notes: -Declines mammogram -Declines Pneumonia and Shingrix vaccines -MMSE=24 -F/U with PCP 09/2017  Medical screening examination/treatment/procedure(s) were performed by non-physician practitioner and as supervising physician I was immediately available for consultation/collaboration.  I agree with above assessment and plan.  Electronically Signed by: Howard Pouch, DO [place] [facility] [place]

## 2017-06-16 NOTE — Patient Instructions (Signed)

## 2017-06-30 DIAGNOSIS — M47819 Spondylosis without myelopathy or radiculopathy, site unspecified: Secondary | ICD-10-CM | POA: Diagnosis not present

## 2017-06-30 DIAGNOSIS — M4855XA Collapsed vertebra, not elsewhere classified, thoracolumbar region, initial encounter for fracture: Secondary | ICD-10-CM | POA: Diagnosis not present

## 2017-06-30 DIAGNOSIS — M25551 Pain in right hip: Secondary | ICD-10-CM | POA: Diagnosis not present

## 2017-09-02 ENCOUNTER — Other Ambulatory Visit: Payer: Self-pay | Admitting: Family Medicine

## 2017-09-02 DIAGNOSIS — I10 Essential (primary) hypertension: Secondary | ICD-10-CM

## 2017-09-11 ENCOUNTER — Other Ambulatory Visit: Payer: Self-pay | Admitting: *Deleted

## 2017-09-11 DIAGNOSIS — I10 Essential (primary) hypertension: Secondary | ICD-10-CM

## 2017-09-11 MED ORDER — HYDRALAZINE HCL 25 MG PO TABS: 25.0000 mg | ORAL_TABLET | Freq: Three times a day (TID) | ORAL | 0 refills | Status: DC

## 2017-09-11 MED ORDER — METOPROLOL SUCCINATE ER 25 MG PO TB24: 25.0000 mg | ORAL_TABLET | Freq: Every day | ORAL | 0 refills | Status: DC

## 2017-09-11 NOTE — Telephone Encounter (Signed)
Metoprolol and hydralazine refilled for 30 [place] supply to local Pharmacy. Patient needs office visit prior to anymore refills.

## 2017-09-22 DIAGNOSIS — H401111 Primary open-angle glaucoma, right eye, mild stage: Secondary | ICD-10-CM | POA: Diagnosis not present

## 2017-09-25 ENCOUNTER — Ambulatory Visit (INDEPENDENT_AMBULATORY_CARE_PROVIDER_SITE_OTHER): Payer: PPO | Admitting: Family Medicine

## 2017-09-25 ENCOUNTER — Other Ambulatory Visit: Payer: Self-pay | Admitting: Family Medicine

## 2017-09-25 ENCOUNTER — Encounter: Payer: Self-pay | Admitting: Family Medicine

## 2017-09-25 ENCOUNTER — Telehealth: Payer: Self-pay | Admitting: Family Medicine

## 2017-09-25 VITALS — BP 166/72 | HR 77 | Temp 98.3°F | Resp 20 | Ht 64.0 in | Wt 150.6 lb

## 2017-09-25 DIAGNOSIS — N183 Chronic kidney disease, stage 3 unspecified: Secondary | ICD-10-CM

## 2017-09-25 DIAGNOSIS — Z9181 History of falling: Secondary | ICD-10-CM | POA: Diagnosis not present

## 2017-09-25 DIAGNOSIS — E785 Hyperlipidemia, unspecified: Secondary | ICD-10-CM

## 2017-09-25 DIAGNOSIS — M4800 Spinal stenosis, site unspecified: Secondary | ICD-10-CM

## 2017-09-25 DIAGNOSIS — I10 Essential (primary) hypertension: Secondary | ICD-10-CM | POA: Diagnosis not present

## 2017-09-25 DIAGNOSIS — M5416 Radiculopathy, lumbar region: Secondary | ICD-10-CM | POA: Insufficient documentation

## 2017-09-25 DIAGNOSIS — D72820 Lymphocytosis (symptomatic): Secondary | ICD-10-CM

## 2017-09-25 DIAGNOSIS — E119 Type 2 diabetes mellitus without complications: Secondary | ICD-10-CM | POA: Diagnosis not present

## 2017-09-25 DIAGNOSIS — Z9989 Dependence on other enabling machines and devices: Secondary | ICD-10-CM | POA: Diagnosis not present

## 2017-09-25 DIAGNOSIS — G629 Polyneuropathy, unspecified: Secondary | ICD-10-CM | POA: Diagnosis not present

## 2017-09-25 LAB — CBC WITH DIFFERENTIAL/PLATELET
Basophils Absolute: 0.1 10*3/uL (ref 0.0–0.1)
Basophils Relative: 0.6 % (ref 0.0–3.0)
Eosinophils Absolute: 0.3 10*3/uL (ref 0.0–0.7)
Eosinophils Relative: 3 % (ref 0.0–5.0)
HCT: 40.2 % (ref 36.0–46.0)
Hemoglobin: 13.4 g/dL (ref 12.0–15.0)
LYMPHS ABS: 4.1 10*3/uL — AB (ref 0.7–4.0)
Lymphocytes Relative: 40.4 % (ref 12.0–46.0)
MCHC: 33.5 g/dL (ref 30.0–36.0)
MCV: 94.3 fl (ref 78.0–100.0)
MONO ABS: 0.5 10*3/uL (ref 0.1–1.0)
Monocytes Relative: 4.9 % (ref 3.0–12.0)
NEUTROS PCT: 51.1 % (ref 43.0–77.0)
Neutro Abs: 5.1 10*3/uL (ref 1.4–7.7)
PLATELETS: 184 10*3/uL (ref 150.0–400.0)
RBC: 4.26 Mil/uL (ref 3.87–5.11)
RDW: 13.6 % (ref 11.5–15.5)
WBC: 10.1 10*3/uL (ref 4.0–10.5)

## 2017-09-25 LAB — COMPREHENSIVE METABOLIC PANEL
ALK PHOS: 69 U/L (ref 39–117)
ALT: 12 U/L (ref 0–35)
AST: 17 U/L (ref 0–37)
Albumin: 4.3 g/dL (ref 3.5–5.2)
BILIRUBIN TOTAL: 0.4 mg/dL (ref 0.2–1.2)
BUN: 22 mg/dL (ref 6–23)
CO2: 28 mEq/L (ref 19–32)
Calcium: 10 mg/dL (ref 8.4–10.5)
Chloride: 104 mEq/L (ref 96–112)
Creatinine, Ser: 1.11 mg/dL (ref 0.40–1.20)
GFR: 49.29 mL/min — ABNORMAL LOW (ref >60.00)
GLUCOSE: 232 mg/dL — AB (ref 70–99)
POTASSIUM: 4.1 meq/L (ref 3.5–5.1)
SODIUM: 141 meq/L (ref 135–145)
TOTAL PROTEIN: 7.1 g/dL (ref 6.0–8.3)

## 2017-09-25 LAB — POCT GLYCOSYLATED HEMOGLOBIN (HGB A1C): HBA1C, POC (CONTROLLED DIABETIC RANGE): 6.5 % (ref 0.0–7.0)

## 2017-09-25 MED ORDER — LISINOPRIL 30 MG PO TABS: 30.0000 mg | ORAL_TABLET | Freq: Every day | ORAL | 1 refills | Status: DC

## 2017-09-25 MED ORDER — METOPROLOL SUCCINATE ER 25 MG PO TB24: 25.0000 mg | ORAL_TABLET | Freq: Every day | ORAL | 1 refills | Status: DC

## 2017-09-25 MED ORDER — GABAPENTIN 300 MG PO CAPS: 600.0000 mg | ORAL_CAPSULE | Freq: Two times a day (BID) | ORAL | 5 refills | Status: DC

## 2017-09-25 MED ORDER — HYDRALAZINE HCL 25 MG PO TABS: 25.0000 mg | ORAL_TABLET | Freq: Three times a day (TID) | ORAL | 1 refills | Status: DC

## 2017-09-25 NOTE — Progress Notes (Signed)
Madison Martinez     Patient ID: Madison Martinez, female  DOB: June 08, 1927, 84 y.o.   MRN: [id number] Patient Care Team    Relationship Specialty Notifications Start End  Ma Hillock, DO PCP - General Family Medicine  09/01/16   Verdell Carmine, MD Referring Physician Internal Medicine  09/01/16   Renelda Loma, OD  Optometry  09/01/16   [facility]  Ophthalmology  06/16/17   Manuela Neptune, DPM Referring Physician Podiatry  06/16/17     Chief Complaint  Patient presents with  . Hypertension  . Diabetes    Subjective:  Madison Martinez is a 84 y.o.  female present for follow up  Essential hypertension/hyperlipidemia/CKD3 Pt reports compliance with Metoprolol-XL 25 mg daily, hydralazine 25 mg TID  and lisinopril 20 mg daily. Patient denies chest pain, shortness of breath, dizziness or lower extremity edema.  She is taking statin and has added Krill oil.  BMP: 09/14/2016, creatinine 1.02, GFR 50 Lipids: 09/14/2016, total cholesterol 250, could not calculate LDL secondary to triglycerides of 632. Patient reports she was fasting overnight.  CBC: 09/14/2016, WBC 11.1, absolute lymphocytosis (chronic)  Diet: Monitors her diet, low-sodium Exercise: Does not exercise much, walks with a cane RF: Hypertension, hyperlipidemia, family history of heart disease  Type 2 diabetes mellitus without complication, without long-term current use of insulin (HCC)/back pain w/ right radiculopathy Patient has been a diet-controlled diabetic for many years. She has not needed medications for some time. Her A1c trend has been 7.0--> 6.8--> 6.5--> 6.5 again  today. Patient has chronic polyneuropathy in which she uses gabapentin 600 mg twice a [place]. She reports that medication is not working well for her over the last 2 months and her pain is worsening of her lower back and radiating down her right leg into her foot.  He has a history of spinal stenosis.  She denies any nonhealing wounds.  She is established with podiatry.   Her dose of gabapentin has caused her to feel dizzy and unbalanced.  She was seen somewhere July 04, 6551, she is uncertain where, she had an x-ray April 15 2019th of her hip and lumbar spine.  He has a history of bilateral hip replacements and "spine surgery "in 2005. Allergic to flu vac. Declined PNA in the past.  Lumbar spine x-ray 06/30/2017: IMPRESSION: Severe diffuse spondylosis and facet arthropathy.  T12 mild compression deformity indeterminate age.  Electronically Signed by: Maurice Small  Result Narrative   TECHNIQUE: AP and lateral 3 view Lumbar spine. Three images.  CLINICAL HISTORY: Low back pain  COMPARISON: None.  FINDINGS:  Five lumbar type vertebrae are present.   Mild compression deformity at T12 indeterminate age.  -The remaining vertebral bodies are maintained.  There is severe diffuse spondylosis throughout the lumbar spine.   Severe facet arthropathy. 9 mm of anterolisthesis L4 on L5.   Moderate constipation.   Bilateral hip arthroplasty.    X-ray right hip 07/03/2017: TECHNIQUE: Right hip 3 views.  INDICATION: Pain.  COMPARISON: 02/25/2015.  FINDINGS: Total hip prosthesis is present. No visible complications. No evidence of fracture or dislocation. Cerclage wires are intact.  Other Result Information  Acute Interface, Incoming Rad Results - 07/03/2017  8:10 AM EDT TECHNIQUE: Right hip 3 views.  INDICATION: Pain.  COMPARISON: 02/25/2015.  FINDINGS: Total hip prosthesis is present. No visible complications. No evidence of fracture or dislocation. Cerclage wires are intact.   IMPRESSION: Negative acute    Depression screen [facility] 2/9 06/16/2017 09/01/2016  Decreased Interest  0 0  Down, Depressed, Hopeless 0 0  PHQ - 2 Score 0 0   No flowsheet data found.     Fall Risk  06/16/2017 09/01/2016  Falls in the past year? No No  Risk for fall due to : - Impaired balance/gait;Impaired mobility    There is no immunization history on file for  this patient.  No exam data present  Past Medical History:  Diagnosis Date  . Blind hypertensive eye, left 04/18/2012  . Gait disturbance 10/05/2015   pt declined neuro referral per records  . Hard of hearing 12/02/2015   hearing aids  . Hyperlipidemia   . Hypertension   . Insomnia    "tried melatonin, advil PM, xanax and klonopin without success"  . Leukocytosis 11/07/2013   seen oncology, did not want to continue to follow there, so continued follow up at PCP.  . Osteoporosis   . Polyneuropathy   . Primary open angle glaucoma 01/2011  . Spinal stenosis   . Stable branch retinal vein occlusion 01/06/2012  . Type 2 diabetes mellitus ([place])    "diet controlled"   Allergies  Allergen Reactions  . Amlodipine Besylate     Other reaction(s): Vertigo  . Flu Virus Vaccine Swelling   Past Surgical History:  Procedure Laterality Date  . EYE SURGERY    . [place]  2005  . TOTAL HIP ARTHROPLASTY Left 1991  . TOTAL HIP ARTHROPLASTY Right ?   x 2   Family History  Problem Relation Age of Onset  . Heart disease Brother    Social History   Socioeconomic History  . Marital status: Divorced    Spouse name: Not on file  . Number of children: 3  . Years of education: 81  . Highest education level: Not on file  Occupational History  . Not on file  Social Needs  . Financial resource strain: Not on file  . Food insecurity:    Worry: Not on file    Inability: Not on file  . Transportation needs:    Medical: Not on file    Non-medical: Not on file  Tobacco Use  . Smoking status: Never Smoker  . Smokeless tobacco: Never Used  Substance and Sexual Activity  . Alcohol use: No  . Drug use: No  . Sexual activity: Never  Lifestyle  . Physical activity:    Days per week: Not on file    Minutes per session: Not on file  . Stress: Not on file  Relationships  . Social connections:    Talks on phone: Not on file    Gets together: Not on file    Attends religious service:  Not on file    Active member of club or organization: Not on file    Attends meetings of clubs or organizations: Not on file    Relationship status: Not on file  . Intimate partner violence:    Fear of current or ex partner: Not on file    Emotionally abused: Not on file    Physically abused: Not on file    Forced sexual activity: Not on file  Other Topics Concern  . Not on file  Social History Narrative   Divorced. 3 children [name], Max and [name]. Lives alone.   High school graduate.   Takes a daily vitamin.   Wears her seatbelt. Wears a hearing aid. Wears dentures.   Requires a cane for assistive device for walking.   Smoke detector in the home.  Feels safe in her relationships.   Allergies as of 09/25/2017      Reactions   Amlodipine Besylate    Other reaction(s): Vertigo   Flu Virus Vaccine Swelling      Medication List        Accurate as of 09/25/17  7:03 PM. Always use your most recent med list.          aspirin 81 MG EC tablet Take 1 tablet by mouth daily.   dorzolamide-timolol 22.3-6.8 MG/ML ophthalmic solution Commonly known as:  COSOPT Place 1 drop into the right eye 2 (two) times daily.   gabapentin 300 MG capsule Commonly known as:  NEURONTIN Take 2 capsules (600 mg total) by mouth 2 (two) times daily.   hydrALAZINE 25 MG tablet Commonly known as:  APRESOLINE Take 1 tablet (25 mg total) by mouth every 8 (eight) hours. Needs office visit prior to anymore refills   KRILL OIL PO Take by mouth.   latanoprost 0.005 % ophthalmic solution Commonly known as:  XALATAN Place 1 drop into the left eye at bedtime.   lisinopril 30 MG tablet Commonly known as:  PRINIVIL,ZESTRIL Take 1 tablet (30 mg total) by mouth daily.   MELATONIN PO Take by mouth.   metoprolol succinate 25 MG 24 hr tablet Commonly known as:  TOPROL-XL Take 1 tablet (25 mg total) by mouth daily.   pravastatin 10 MG tablet Commonly known as:  PRAVACHOL Take 1 tablet (10 mg total) by  mouth daily.   VITAMIN D-1000 MAX ST 1000 units tablet Generic drug:  Cholecalciferol Take 2 tablets by mouth daily.       All past medical history, surgical history, allergies, family history, immunizations andmedications were updated in the EMR today and reviewed under the history and medication portions of their EMR.    Recent Results (from the past 2160 hour(s))  POCT glycosylated hemoglobin (Hb A1C)     Status: Abnormal   Collection Time: 09/25/17  9:00 AM  Result Value Ref Range   Hemoglobin A1C  4.0 - 5.6 %   HbA1c POC (<> result, manual entry)  4.0 - 5.6 %   HbA1c, POC (prediabetic range)  5.7 - 6.4 %   HbA1c, POC (controlled diabetic range) 6.5 0.0 - 7.0 %  Comp Met (CMET)     Status: Abnormal   Collection Time: 09/25/17  9:20 AM  Result Value Ref Range   Sodium 141 135 - 145 mEq/L   Potassium 4.1 3.5 - 5.1 mEq/L   Chloride 104 96 - 112 mEq/L   CO2 28 19 - 32 mEq/L   Glucose, Bld 232 (H) 70 - 99 mg/dL   BUN 22 6 - 23 mg/dL   Creatinine, Ser 1.11 0.40 - 1.20 mg/dL   Total Bilirubin 0.4 0.2 - 1.2 mg/dL   Alkaline Phosphatase 69 39 - 117 U/L   AST 17 0 - 37 U/L   ALT 12 0 - 35 U/L   Total Protein 7.1 6.0 - 8.3 g/dL   Albumin 4.3 3.5 - 5.2 g/dL   Calcium 10.0 8.4 - 10.5 mg/dL   GFR 49.29 (L) >60.00 mL/min  CBC w/Diff     Status: Abnormal   Collection Time: 09/25/17  9:20 AM  Result Value Ref Range   WBC 10.1 4.0 - 10.5 K/uL   RBC 4.26 3.87 - 5.11 Mil/uL   Hemoglobin 13.4 12.0 - 15.0 g/dL   HCT 40.2 36.0 - 46.0 %   MCV 94.3 78.0 - 100.0 fl  MCHC 33.5 30.0 - 36.0 g/dL   RDW 13.6 11.5 - 15.5 %   Platelets 184.0 150.0 - 400.0 K/uL   Neutrophils Relative % 51.1 43.0 - 77.0 %   Lymphocytes Relative 40.4 12.0 - 46.0 %   Monocytes Relative 4.9 3.0 - 12.0 %   Eosinophils Relative 3.0 0.0 - 5.0 %   Basophils Relative 0.6 0.0 - 3.0 %   Neutro Abs 5.1 1.4 - 7.7 K/uL   Lymphs Abs 4.1 (H) 0.7 - 4.0 K/uL   Monocytes Absolute 0.5 0.1 - 1.0 K/uL   Eosinophils Absolute 0.3  0.0 - 0.7 K/uL   Basophils Absolute 0.1 0.0 - 0.1 K/uL    No results found.   ROS: 14 pt review of systems performed and negative (unless mentioned in an HPI)  Objective: BP (!) 166/72 (BP Location: Left Arm, Patient Position: Sitting, Cuff Size: Large)   Pulse 77   Temp 98.3 F (36.8 C)   Resp 20   Ht '5\' 4"'  (1.626 m)   Wt 150 lb 9.6 oz (68.3 kg)   SpO2 96%   BMI 25.85 kg/m   Gen: Afebrile. No acute distress.  Nontoxic and presentation.  Hard of hearing.  Walking okay with assistance of cane  today. HENT: AT. [place]. MMM.  Eyes:Pupils Equal Round Reactive to light, Extraocular movements intact,  Conjunctiva without redness, discharge or icterus. CV: RRR no murmur, no edema, +2/4 P posterior tibialis pulses Chest: CTAB, no wheeze or crackles Abd: Soft. NTND. BS present.  No masses palpated.  MSK/neuro: No erythema, no soft tissue swelling.  Walking okay with cane today.  Oriented X3.  Alert. Psych: Normal affect, dress and demeanor. Normal speech. Normal thought content and judgment.  Assessment/plan: Takeesha Dileo is a 84 y.o. female present follow up  Essential hypertension/hyperlipidemia/CKD3 -Will attempt again to have home health physical therapy for eval and treat, including safety eval and evaluation for strength in need of physical therapy for her acute complaints.  She is homebound.  We will also need nurse to perform medication check and BP monitoring with feedback provider. - Continue Toprol XL 25 mg daily, lisinopril increased to 30 mg daily and Hydralazine 25 mg TID.  Will eventually try to maximize lisinopril and remove hydralazine if able. - Continue pravastatin and krill oil.  - BMP, CBC collected today - Calcium/PTH/vitamin D yearly can collected today - Follow-up 3 months, sooner if needed  Type 2 diabetes mellitus without complication, without long-term current use of insulin (HCC) A1c 7.0 --> 6.8 --> 6.5 --> 6.5 again today. Continue dietary modification.  -  6  months f/u can consider pneumonia vaccinations if she desires, she is uncertain of her immunization.   Monoclonal B-cell lymphocytosis Is not followed up in almost a year with her hematologist.  Will complete CBC and if worsening WBCs as lymphocytes will refer back again to hematology. - Comp Met (CMET) - CBC w/Diff  Polyneuropathy/ Use of cane as ambulatory aid/Spinal stenosis, unspecified spinal region Right lumbar radiculopathy - home health physical therapy for eval and treat, including safety eval and evaluation for strength in need of physical therapy for her acute complaints.  She is homebound.  We will also need nurse to perform medication check and BP monitoring with feedback provider. -Referral to orthopedic/neurology for further evaluation on her worsening radiculopathy of her right leg her history of spinal stenosis and bilateral right hip replacements.  We are unable to increase her gabapentin secondary to dizziness and gait instability with  higher doses.       Return in about 3 months (around 12/26/2017) for HTN'.  Orders Placed This Encounter  Procedures  . Comp Met (CMET)  . CBC w/Diff  . Ambulatory referral to Orthopedic Surgery    Referral Priority:   Medium    Referral Type:   Surgical    Referral Reason:   Specialty Services Required    Requested Specialty:   Orthopedic Surgery    Number of Visits Requested:   12  . Ambulatory referral to Home Health    Referral Priority:   Routine    Referral Type:   [place]    Referral Reason:   Specialty Services Required    Requested Specialty:   [place]    Number of Visits Requested:   12  . POCT glycosylated hemoglobin (Hb A1C)    Electronically signed by: Howard Pouch, DO [place]

## 2017-09-25 NOTE — Telephone Encounter (Signed)
Please inform patient the following information: Her labs indicated progressing mild kidney dysfunction. I suspect this is from her BP being elevated.  I have increased her lisinopril dose from 20 mg to 30 mg a [place]. Her new prescription was called in at the increased dose.   I would like to see her in 3 months (instead of the 6 mos) to recheck after dose change. --> please schedule.

## 2017-09-25 NOTE — Patient Instructions (Signed)
I am ordering home health and physical therapy to come to the home.  We will also refer you to neurology to further evaluate your back.

## 2017-09-26 NOTE — Telephone Encounter (Signed)
Patient son notified and verbalized understanding. Appointment scheduled.

## 2017-09-29 ENCOUNTER — Other Ambulatory Visit: Payer: Self-pay | Admitting: Family Medicine

## 2017-09-29 DIAGNOSIS — I10 Essential (primary) hypertension: Secondary | ICD-10-CM

## 2017-10-10 ENCOUNTER — Other Ambulatory Visit: Payer: Self-pay | Admitting: Family Medicine

## 2017-10-10 DIAGNOSIS — I10 Essential (primary) hypertension: Secondary | ICD-10-CM

## 2017-10-17 ENCOUNTER — Encounter (INDEPENDENT_AMBULATORY_CARE_PROVIDER_SITE_OTHER): Payer: Self-pay | Admitting: Orthopaedic Surgery

## 2017-10-17 ENCOUNTER — Ambulatory Visit (INDEPENDENT_AMBULATORY_CARE_PROVIDER_SITE_OTHER): Payer: PPO

## 2017-10-17 ENCOUNTER — Ambulatory Visit (INDEPENDENT_AMBULATORY_CARE_PROVIDER_SITE_OTHER): Payer: PPO | Admitting: Orthopaedic Surgery

## 2017-10-17 VITALS — BP 134/71 | HR 80 | Ht 64.0 in | Wt 150.0 lb

## 2017-10-17 DIAGNOSIS — M5441 Lumbago with sciatica, right side: Secondary | ICD-10-CM

## 2017-10-17 DIAGNOSIS — G8929 Other chronic pain: Secondary | ICD-10-CM

## 2017-10-17 MED ORDER — TRAMADOL HCL 50 MG PO TABS: 50.0000 mg | ORAL_TABLET | Freq: Two times a day (BID) | ORAL | 0 refills | Status: DC

## 2017-10-17 NOTE — Progress Notes (Signed)
Office Visit Note   Patient: Madison Martinez           Date of Birth: February 22, 1928           MRN: [id number] Visit Date: 10/17/2017              Requested by: Ma Hillock, DO [address] [place], [place] [address] PCP: Ma Hillock, DO   Assessment & Plan: Visit Diagnoses:  1. Chronic right-sided low back pain with right-sided sciatica     Plan: Patient thinks she had previous lumbar decompression surgery and 2007.  Will obtain a CT scan to evaluate her for potential restenosis or spinal stenosis of another level.  Office follow-up after CT scan.  Ultram 30 tablets prescribed 1 p.o. twice daily as needed pain.  She is not a candidate for prednisone or Medrol Dosepak due to her diabetes.  Follow-Up Instructions: No follow-ups on file.   Orders:  Orders Placed This Encounter  Procedures  . XR Lumbar Spine 2-3 Views  . XR Pelvis 1-2 Views   Meds ordered this encounter  Medications  . traMADol (ULTRAM) 50 MG tablet    Sig: Take 1 tablet (50 mg total) by mouth 2 (two) times daily.    Dispense:  30 tablet    Refill:  0      Procedures: No procedures performed   Clinical Data: No additional findings.   Subjective: Chief Complaint  Patient presents with  . Right Leg - Pain    HPI 84 year old female lives in [place] is here with her son with complaints of back and right leg pain.  She is had previous total hip arthroplasties done in [place] with apparent revision surgery on the right with femoral cables.  She denies any left lower extremity pain she states she has pain with sitting and standing she has a rolling walker with reverse seat.  She denies diabetes however she does have lab work on epic that shows she is had hyperglycemia last glucose  273.  Her A1c was 6.5 she is not on any medications.  She states she has pain with sitting and standing cannot stand long cannot walk far.  She denies history of hip dislocation and today she repetitively crosses her  legs.  Patient's poor historian she grew up in [place] but does not know what town she grew up in.  Review of Systems due to patient's poor history chart review for review of systems positive for monoclonal B-cell lymphocytosis, type 2 diabetes not on any medication. Hypertensive I.  Previous total hip arthroplasties.  Spinal stenosis lumbar decompression surgery in the past with grade 2 L4-5 anterolisthesis.  Mild positive for memory loss history of hip revisions otherwise negative as it pertains HPI.   Objective: Vital Signs: BP 134/71   Pulse 80   Ht 5\' 4"  (1.626 m)   Wt 150 lb (68 kg)   BMI 25.75 kg/m   Physical Exam  Constitutional: She is oriented to person, place, and time. She appears well-developed.  HENT:  Head: Normocephalic.  Right Ear: External ear normal.  Left Ear: External ear normal.  Eyes: Pupils are equal, round, and reactive to light.  Neck: No tracheal deviation present. No thyromegaly present.  Cardiovascular: Normal rate.  Pulmonary/Chest: Effort normal.  Abdominal: Soft.  Neurological: She is alert and oriented to person, place, and time.  Skin: Skin is warm and dry.  Psychiatric: She has a normal mood and affect. Her behavior is normal.  Ortho Exam well-healed lumbar incision at L4-5 possibly at L3-4 also well-healed without tenderness or cellulitis.  She has some pain with straight leg raising on the right at 90 degrees.  No pain with hip range of motion leg lengths are equal.  No rash over exposed skin.  Palpable posterior tib right and left pulse.  Knees reach full extension good flexion and 90 degrees.  Specialty Comments:  No specialty comments available.  Imaging: Xr Lumbar Spine 2-3 Views  Result Date: 10/17/2017 AP lateral lumbar spine x-rays obtained which shows multilevel lumbar disc degeneration with narrowing spurring and grade 2 anterolisthesis at L4-5 with previous lumbar decompression centrally noted.  Narrowing at L5-S1 with possibly  some shortening of L5 vertebral body age-indeterminate. Impression: Previous lumbar decompression surgery with grade 2 anterolisthesis at L4-5.  Xr Pelvis 1-2 Views  Result Date: 10/17/2017 AP pelvis x-rays obtained and reviewed.  This shows bilateral total hip arthroplasties with cables on the right femur.  No subsidence or eccentric poly-wear no loosening. Impression: Negative for acute x-rays.  Previous total hip arthroplasties right and left.    PMFS History: Patient Active Problem List   Diagnosis Date Noted  . Right lumbar radiculopathy 09/25/2017  . At moderate risk for fall 09/25/2017  . Use of cane as ambulatory aid 12/16/2016  . Chronic kidney disease, stage 3 ([facility]) 12/16/2016  . Hyperlipidemia   . Osteoporosis   . Polyneuropathy   . Spinal stenosis   . Hard of hearing 12/02/2015  . Monoclonal B-cell lymphocytosis 11/07/2013  . Blind hypertensive eye 04/18/2012  . Diabetes mellitus, type II ([facility]) 03/06/2012  . Essential hypertension 03/06/2012  . Branch retinal vein occlusion 01/06/2012  . Primary open angle glaucoma 01/06/2012  . Pseudophakia 01/06/2012  . Stable branch retinal vein occlusion 01/06/2012   Past Medical History:  Diagnosis Date  . Blind hypertensive eye, left 04/18/2012  . Gait disturbance 10/05/2015   pt declined neuro referral per records  . Hard of hearing 12/02/2015   hearing aids  . Hyperlipidemia   . Hypertension   . Insomnia    "tried melatonin, advil PM, xanax and klonopin without success"  . Leukocytosis 11/07/2013   seen oncology, did not want to continue to follow there, so continued follow up at PCP.  . Osteoporosis   . Polyneuropathy   . Primary open angle glaucoma 01/2011  . Spinal stenosis   . Stable branch retinal vein occlusion 01/06/2012  . Type 2 diabetes mellitus ([facility])    "diet controlled"    Family History  Problem Relation Age of Onset  . Heart disease Brother     Past Surgical History:  Procedure Laterality Date    . EYE SURGERY    . [place]  2005  . TOTAL HIP ARTHROPLASTY Left 1991  . TOTAL HIP ARTHROPLASTY Right ?   x 2   Social History   Occupational History  . Not on file  Tobacco Use  . Smoking status: Never Smoker  . Smokeless tobacco: Never Used  Substance and Sexual Activity  . Alcohol use: No  . Drug use: No  . Sexual activity: Never

## 2017-10-18 DIAGNOSIS — M810 Age-related osteoporosis without current pathological fracture: Secondary | ICD-10-CM | POA: Diagnosis not present

## 2017-10-18 DIAGNOSIS — G63 Polyneuropathy in diseases classified elsewhere: Secondary | ICD-10-CM | POA: Diagnosis not present

## 2017-10-18 DIAGNOSIS — M4807 Spinal stenosis, lumbosacral region: Secondary | ICD-10-CM | POA: Diagnosis not present

## 2017-10-18 DIAGNOSIS — E119 Type 2 diabetes mellitus without complications: Secondary | ICD-10-CM | POA: Diagnosis not present

## 2017-10-18 DIAGNOSIS — M5417 Radiculopathy, lumbosacral region: Secondary | ICD-10-CM | POA: Diagnosis not present

## 2017-10-18 DIAGNOSIS — H401130 Primary open-angle glaucoma, bilateral, stage unspecified: Secondary | ICD-10-CM | POA: Diagnosis not present

## 2017-10-18 DIAGNOSIS — I10 Essential (primary) hypertension: Secondary | ICD-10-CM | POA: Diagnosis not present

## 2017-10-19 ENCOUNTER — Telehealth: Payer: Self-pay | Admitting: Family Medicine

## 2017-10-19 NOTE — Telephone Encounter (Signed)
Spoke with [facility]. Verbal order for recommended Nursing visits given per Dr Raoul Pitch referral.

## 2017-10-19 NOTE — Telephone Encounter (Signed)
Copied from [place] [phone]. Topic: Quick Communication - See Telephone Encounter >> Oct 19, 2017  8:34 AM Burchel, Abbi R wrote: CRM for notification. See Telephone encounter for: 10/19/17.  DeeAnn requesting v/o for nursing 1*1wk   2*2wk   1*3wk  focus-Med Education and Disease Mgmt for HTN.    [phone]

## 2017-10-24 ENCOUNTER — Other Ambulatory Visit (INDEPENDENT_AMBULATORY_CARE_PROVIDER_SITE_OTHER): Payer: Self-pay | Admitting: Orthopaedic Surgery

## 2017-10-24 ENCOUNTER — Telehealth: Payer: Self-pay | Admitting: Family Medicine

## 2017-10-24 ENCOUNTER — Telehealth (INDEPENDENT_AMBULATORY_CARE_PROVIDER_SITE_OTHER): Payer: Self-pay | Admitting: Orthopaedic Surgery

## 2017-10-24 NOTE — Telephone Encounter (Signed)
Copied from [place] [phone]. Topic: General - Other >> Oct 24, 2017  8:36 AM Cecelia Byars, NT wrote: Reason for CRM: Deneise Lever from [facility] called and would like a verbal to use  a ultrasound to address  pain  during pt, 2 x a week for 4 weeks [phone]

## 2017-10-24 NOTE — Telephone Encounter (Signed)
ok 

## 2017-10-24 NOTE — Telephone Encounter (Signed)
I called and spoke with Madison Martinez.  Patient had #30 Tramadol 50mg  1 po bid prn prescribed at her appointment on July 30.  It has only been one week since the prescription was filled. He states that he believes his mom is out of medication. I explained that she would have been taking the medication differently than as prescribed to be out already. He wonders if there has been a mistake at the pharmacy and with the number she was given. I explained he would have to speak to the pharmacy about that. He is on his way to his mother's house and will check with her when there. I did explain that, due to the fact it has only been one week and is not time for a refill, this message will have to wait for Dr. Lorin Mercy to advise when he returns.  Patient's son expressed understanding.

## 2017-10-24 NOTE — Telephone Encounter (Signed)
Patient's son (Max) called advised his mother need Rx refilled (Tramadol) The number to contact Max is [phone]

## 2017-10-25 MED ORDER — TRAMADOL HCL 50 MG PO TABS: 50.0000 mg | ORAL_TABLET | Freq: Two times a day (BID) | ORAL | 0 refills | Status: DC

## 2017-10-25 NOTE — Telephone Encounter (Signed)
Tried to call number listed get no answer.

## 2017-10-25 NOTE — Telephone Encounter (Signed)
Ok to write how yates wrote in past, or can go to different pharmacy

## 2017-10-25 NOTE — Telephone Encounter (Signed)
Patient's son Max called back this morning. He spoke with the pharmacy and got his mother's prescription bottle and states that only #14 pills of tramadol were filled.  I called [facility] in [place] and spoke with Raquel Sarna who states they only filled #14 due to it being the initial prescription for the patient.    Patient requests refill on Tramadol.  Can you please advise since Dr. Lorin Mercy is out of the office?  He originally wrote #30 taken bid on 7/30.

## 2017-10-25 NOTE — Telephone Encounter (Signed)
Script called to pharmacy. I called patient's son and advised. Rx is not taking all of his mother's pain away, but has helped give her tremendous relief. He expressed appreciation to all taking care of her.

## 2017-10-26 NOTE — Telephone Encounter (Signed)
Spoke with Deneise Lever at [facility] verbal order given. She will fax paper order for signature.

## 2017-11-21 DIAGNOSIS — D72820 Lymphocytosis (symptomatic): Secondary | ICD-10-CM | POA: Diagnosis not present

## 2017-11-21 DIAGNOSIS — M810 Age-related osteoporosis without current pathological fracture: Secondary | ICD-10-CM | POA: Diagnosis not present

## 2017-11-21 DIAGNOSIS — Z7982 Long term (current) use of aspirin: Secondary | ICD-10-CM | POA: Diagnosis not present

## 2017-11-21 DIAGNOSIS — I129 Hypertensive chronic kidney disease with stage 1 through stage 4 chronic kidney disease, or unspecified chronic kidney disease: Secondary | ICD-10-CM | POA: Diagnosis not present

## 2017-11-21 DIAGNOSIS — M4726 Other spondylosis with radiculopathy, lumbar region: Secondary | ICD-10-CM | POA: Diagnosis not present

## 2017-11-21 DIAGNOSIS — N183 Chronic kidney disease, stage 3 (moderate): Secondary | ICD-10-CM | POA: Diagnosis not present

## 2017-11-21 DIAGNOSIS — E1142 Type 2 diabetes mellitus with diabetic polyneuropathy: Secondary | ICD-10-CM | POA: Diagnosis not present

## 2017-11-21 DIAGNOSIS — Z96643 Presence of artificial hip joint, bilateral: Secondary | ICD-10-CM | POA: Diagnosis not present

## 2017-11-21 DIAGNOSIS — M4800 Spinal stenosis, site unspecified: Secondary | ICD-10-CM | POA: Diagnosis not present

## 2017-11-21 DIAGNOSIS — E1122 Type 2 diabetes mellitus with diabetic chronic kidney disease: Secondary | ICD-10-CM | POA: Diagnosis not present

## 2017-11-24 ENCOUNTER — Ambulatory Visit (INDEPENDENT_AMBULATORY_CARE_PROVIDER_SITE_OTHER): Payer: PPO | Admitting: Family Medicine

## 2017-11-24 ENCOUNTER — Telehealth (INDEPENDENT_AMBULATORY_CARE_PROVIDER_SITE_OTHER): Payer: Self-pay | Admitting: Orthopaedic Surgery

## 2017-11-24 ENCOUNTER — Encounter: Payer: Self-pay | Admitting: Family Medicine

## 2017-11-24 ENCOUNTER — Other Ambulatory Visit (INDEPENDENT_AMBULATORY_CARE_PROVIDER_SITE_OTHER): Payer: Self-pay | Admitting: Physician Assistant

## 2017-11-24 VITALS — BP 134/80 | HR 64 | Temp 98.0°F | Resp 20 | Ht 64.0 in | Wt 148.5 lb

## 2017-11-24 DIAGNOSIS — I10 Essential (primary) hypertension: Secondary | ICD-10-CM

## 2017-11-24 DIAGNOSIS — N183 Chronic kidney disease, stage 3 unspecified: Secondary | ICD-10-CM

## 2017-11-24 DIAGNOSIS — H35039 Hypertensive retinopathy, unspecified eye: Secondary | ICD-10-CM | POA: Diagnosis not present

## 2017-11-24 DIAGNOSIS — E118 Type 2 diabetes mellitus with unspecified complications: Secondary | ICD-10-CM | POA: Diagnosis not present

## 2017-11-24 DIAGNOSIS — E785 Hyperlipidemia, unspecified: Secondary | ICD-10-CM

## 2017-11-24 MED ORDER — LISINOPRIL 30 MG PO TABS: 30.0000 mg | ORAL_TABLET | Freq: Every day | ORAL | 1 refills | Status: DC

## 2017-11-24 MED ORDER — TRAMADOL HCL 50 MG PO TABS: 50.0000 mg | ORAL_TABLET | Freq: Two times a day (BID) | ORAL | 0 refills | Status: DC

## 2017-11-24 NOTE — Patient Instructions (Signed)
It was a pleasure seeing you today.  Your BP looks great.  I have refilled your medications.  Follow up in 6 months.    Please help [place] help you:  We are honored you have chosen [facility] for your Primary Care home. Below you will find basic instructions that you may need to access in the future. Please help [place] help you by reading the instructions, which cover many of the frequent questions we experience.   Prescription refills and request:  -In order to allow more efficient response time, please call your pharmacy for all refills. They will forward the request electronically to [place]. This allows for the quickest possible response. Request left on a nurse line can take longer to refill, since these are checked as time allows between office patients and other phone calls.  - refill request can take up to 3-5 working days to complete.  - If request is sent electronically and request is appropiate, it is usually completed in 1-2 business days.  - all patients will need to be seen routinely for all chronic medical conditions requiring prescription medications (see follow-up below). If you are overdue for follow up on your condition, you will be asked to make an appointment and we will call in enough medication to cover you until your appointment (up to 30 days).  - all controlled substances will require a face to face visit to request/refill.  - if you desire your prescriptions to go through a new pharmacy, and have an active script at original pharmacy, you will need to call your pharmacy and have scripts transferred to new pharmacy. This is completed between the pharmacy locations and not by your provider.    Results: If any images or labs were ordered, it can take up to 1 week to get results depending on the test ordered and the lab/facility running and resulting the test. - Normal or stable results, which do not need further discussion, may be released to your mychart immediately with  attached note to you. A call may not be generated for normal results. Please make certain to sign up for mychart. If you have questions on how to activate your mychart you can call the front office.  - If your results need further discussion, our office will attempt to contact you via phone, and if unable to reach you after 2 attempts, we will release your abnormal result to your mychart with instructions.  - All results will be automatically released in mychart after 1 week.  - Your provider will provide you with explanation and instruction on all relevant material in your results. Please keep in mind, results and labs may appear confusing or abnormal to the untrained eye, but it does not mean they are actually abnormal for you personally. If you have any questions about your results that are not covered, or you desire more detailed explanation than what was provided, you should make an appointment with your provider to do so.   Our office handles many outgoing and incoming calls daily. If we have not contacted you within 1 week about your results, please check your mychart to see if there is a message first and if not, then contact our office.  In helping with this matter, you help decrease call volume, and therefore allow [place] to be able to respond to patients needs more efficiently.   Acute office visits (sick visit):  An acute visit is intended for a new problem and are scheduled in shorter  time slots to allow schedule openings for patients with new problems. This is the appropriate visit to discuss a new problem. Problems will not be addressed by phone call or Echart message. Appointment is needed if requesting treatment. In order to provide you with excellent quality medical care with proper time for you to explain your problem, have an exam and receive treatment with instructions, these appointments should be limited to one new problem per visit. If you experience a new problem, in which you desire to  be addressed, please make an acute office visit, we save openings on the schedule to accommodate you. Please do not save your new problem for any other type of visit, let us take care of it properly and quickly for you.   Follow up visits:  Depending on your condition(s) your provider will need to see you routinely in order to provide you with quality care and prescribe medication(s). Most chronic conditions (Example: hypertension, Diabetes, depression/anxiety... etc), require visits a couple times a year. Your provider will instruct you on proper follow up for your personal medical conditions and history. Please make certain to make follow up appointments for your condition as instructed. Failing to do so could result in lapse in your medication treatment/refills. If you request a refill, and are overdue to be seen on a condition, we will always provide you with a 30 Cerezo script (once) to allow you time to schedule.    Medicare wellness (well visit): - we have a wonderful Nurse Maudie Mercury), that will meet with you and provide you will yearly medicare wellness visits. These visits should occur yearly (can not be scheduled less than 1 calendar year apart) and cover preventive health, immunizations, advance directives and screenings you are entitled to yearly through your medicare benefits. Do not miss out on your entitled benefits, this is when medicare will pay for these benefits to be ordered for you.  These are strongly encouraged by your provider and is the appropriate type of visit to make certain you are up to date with all preventive health benefits. If you have not had your medicare wellness exam in the last 12 months, please make certain to schedule one by calling the office and schedule your medicare wellness with Maudie Mercury as soon as possible.   Yearly physical (well visit):  - Adults are recommended to be seen yearly for physicals. Check with your insurance and date of your last physical, most insurances  require one calendar year between physicals. Physicals include all preventive health topics, screenings, medical exam and labs that are appropriate for gender/age and history. You may have fasting labs needed at this visit. This is a well visit (not a sick visit), new problems should not be covered during this visit (see acute visit).  - Pediatric patients are seen more frequently when they are younger. Your provider will advise you on well child visit timing that is appropriate for your their age. - This is not a medicare wellness visit. Medicare wellness exams do not have an exam portion to the visit. Some medicare companies allow for a physical, some do not allow a yearly physical. If your medicare allows a yearly physical you can schedule the medicare wellness with our nurse Maudie Mercury and have your physical with your provider after, on the same [place]. Please check with insurance for your full benefits.   Late Policy/No Shows:  - all new patients should arrive 15-30 minutes earlier than appointment to allow [place] time  to  obtain all  personal demographics,  insurance information and for you to complete office paperwork. - All established patients should arrive 10-15 minutes earlier than appointment time to update all information and be checked in .  - In our best efforts to run on time, if you are late for your appointment you will be asked to either reschedule or if able, we will work you back into the schedule. There will be a wait time to work you back in the schedule,  depending on availability.  - If you are unable to make it to your appointment as scheduled, please call 24 hours ahead of time to allow [place] to fill the time slot with someone else who needs to be seen. If you do not cancel your appointment ahead of time, you may be charged a no show fee.

## 2017-11-24 NOTE — Telephone Encounter (Signed)
Patient's son Max called advised patient need Rx refilled (Tramadol) The number to contact Max is [phone]

## 2017-11-24 NOTE — Progress Notes (Signed)
Madison Martinez     Patient ID: Madison Martinez, female  DOB: 12-17-1927, 84 y.o.   MRN: [id number] Patient Care Team    Relationship Specialty Notifications Start End  Ma Hillock, DO PCP - General Family Medicine  09/01/16   Verdell Carmine, MD Referring Physician Internal Medicine  09/01/16   Renelda Loma, OD  Optometry  09/01/16   [facility]ates, [facility]  Ophthalmology  06/16/17   Manuela Neptune, DPM Referring Physician Podiatry  06/16/17     Chief Complaint  Patient presents with  . Hypertension    Subjective:  Madison Martinez is a 84 y.o.  female present for follow up  Essential hypertension/hyperlipidemia/CKD3 Pt reports compliancewith Metoprolol-XL 25 mg daily, hydralazine 25 mg TID  and lisinopril 30 mg daily. Patient denies chest pain, shortness of breath, dizziness or lower extremity edema.  She is taking statin and has added Krill oil.  BMP: 09/25/2017, GFR 49 Lipids: 09/14/2016, total cholesterol 250, could not calculate LDL secondary to triglycerides of 632. Patient reports she was fasting overnight.  CBC: 09/25/2017 wnl Diet: Monitors her diet, low-sodium Exercise: Does not exercise much, walks with a cane RF: Hypertension, hyperlipidemia, family history of heart disease  Depression screen [facility] 2/9 11/24/2017 06/16/2017 09/01/2016  Decreased Interest 0 0 0  Down, Depressed, Hopeless 0 0 0  PHQ - 2 Score 0 0 0   No flowsheet data found.     Fall Risk  11/24/2017 06/16/2017 09/01/2016  Falls in the past year? No No No  Risk for fall due to : - - Impaired balance/gait;Impaired mobility    There is no immunization history on file for this patient.  No exam data present  Past Medical History:  Diagnosis Date  . Blind hypertensive eye, left 04/18/2012  . Gait disturbance 10/05/2015   pt declined neuro referral per records  . Hard of hearing 12/02/2015   hearing aids  . Hyperlipidemia   . Hypertension   . Insomnia    "tried melatonin, advil PM, xanax and klonopin without success"   . Leukocytosis 11/07/2013   seen oncology, did not want to continue to follow there, so continued follow up at PCP.  . Osteoporosis   . Polyneuropathy   . Primary open angle glaucoma 01/2011  . Spinal stenosis   . Stable branch retinal vein occlusion 01/06/2012  . Type 2 diabetes mellitus ([place])    "diet controlled"   Allergies  Allergen Reactions  . Amlodipine Besylate     Other reaction(s): Vertigo  . Flu Virus Vaccine Swelling   Past Surgical History:  Procedure Laterality Date  . EYE SURGERY    . [place]  2005  . TOTAL HIP ARTHROPLASTY Left 1991  . TOTAL HIP ARTHROPLASTY Right ?   x 2   Family History  Problem Relation Age of Onset  . Heart disease Brother    Social History   Socioeconomic History  . Marital status: Divorced    Spouse name: Not on file  . Number of children: 3  . Years of education: 69  . Highest education level: Not on file  Occupational History  . Not on file  Social Needs  . Financial resource strain: Not on file  . Food insecurity:    Worry: Not on file    Inability: Not on file  . Transportation needs:    Medical: Not on file    Non-medical: Not on file  Tobacco Use  . Smoking status: Never Smoker  .  Smokeless tobacco: Never Used  Substance and Sexual Activity  . Alcohol use: No  . Drug use: No  . Sexual activity: Never  Lifestyle  . Physical activity:    Days per week: Not on file    Minutes per session: Not on file  . Stress: Not on file  Relationships  . Social connections:    Talks on phone: Not on file    Gets together: Not on file    Attends religious service: Not on file    Active member of club or organization: Not on file    Attends meetings of clubs or organizations: Not on file    Relationship status: Not on file  . Intimate partner violence:    Fear of current or ex partner: Not on file    Emotionally abused: Not on file    Physically abused: Not on file    Forced sexual activity: Not on file  Other  Topics Concern  . Not on file  Social History Narrative   Divorced. 3 children Madison Martinez, Madison Martinez and Madison Martinez. Lives alone.   High school graduate.   Takes a daily vitamin.   Wears her seatbelt. Wears a hearing aid. Wears dentures.   Requires a cane for assistive device for walking.   Smoke detector in the home.   Feels safe in her relationships.   Allergies as of 11/24/2017      Reactions   Amlodipine Besylate    Other reaction(s): Vertigo   Flu Virus Vaccine Swelling      Medication List        Accurate as of 11/24/17 11:59 PM. Always use your most recent med list.          aspirin 81 MG EC tablet Take 1 tablet by mouth daily.   dorzolamide-timolol 22.3-6.8 MG/ML ophthalmic solution Commonly known as:  COSOPT Place 1 drop into the right eye 2 (two) times daily.   gabapentin 300 MG capsule Commonly known as:  NEURONTIN Take 2 capsules (600 mg total) by mouth 2 (two) times daily.   hydrALAZINE 25 MG tablet Commonly known as:  APRESOLINE Take 1 tablet (25 mg total) by mouth every 8 (eight) hours. Needs office visit prior to anymore refills   KRILL OIL PO Take by mouth.   latanoprost 0.005 % ophthalmic solution Commonly known as:  XALATAN Place 1 drop into the left eye at bedtime.   lisinopril 30 MG tablet Commonly known as:  PRINIVIL,ZESTRIL Take 1 tablet (30 mg total) by mouth daily.   MELATONIN PO Take by mouth.   metoprolol succinate 25 MG 24 hr tablet Commonly known as:  TOPROL-XL Take 1 tablet (25 mg total) by mouth daily.   pravastatin 10 MG tablet Commonly known as:  PRAVACHOL Take 1 tablet (10 mg total) by mouth daily.   traMADol 50 MG tablet Commonly known as:  ULTRAM Take 1 tablet (50 mg total) by mouth 2 (two) times daily.   VITAMIN D-1000 Madison Martinez ST 1000 units tablet Generic drug:  Cholecalciferol Take 2 tablets by mouth daily.       All past medical history, surgical history, allergies, family history, immunizations andmedications were updated in  the EMR today and reviewed under the history and medication portions of their EMR.    Recent Results (from the past 2160 hour(s))  POCT glycosylated hemoglobin (Hb A1C)     Status: Abnormal   Collection Time: 09/25/17  9:00 AM  Result Value Ref Range   Hemoglobin A1C  4.0 -  5.6 %   HbA1c POC (<> result, manual entry)  4.0 - 5.6 %   HbA1c, POC (prediabetic range)  5.7 - 6.4 %   HbA1c, POC (controlled diabetic range) 6.5 0.0 - 7.0 %  Comp Met (CMET)     Status: Abnormal   Collection Time: 09/25/17  9:20 AM  Result Value Ref Range   Sodium 141 135 - 145 mEq/L   Potassium 4.1 3.5 - 5.1 mEq/L   Chloride 104 96 - 112 mEq/L   CO2 28 19 - 32 mEq/L   Glucose, Bld 232 (H) 70 - 99 mg/dL   BUN 22 6 - 23 mg/dL   Creatinine, Ser 1.11 0.40 - 1.20 mg/dL   Total Bilirubin 0.4 0.2 - 1.2 mg/dL   Alkaline Phosphatase 69 39 - 117 U/L   AST 17 0 - 37 U/L   ALT 12 0 - 35 U/L   Total Protein 7.1 6.0 - 8.3 g/dL   Albumin 4.3 3.5 - 5.2 g/dL   Calcium 10.0 8.4 - 10.5 mg/dL   GFR 49.29 (L) >60.00 mL/min  CBC w/Diff     Status: Abnormal   Collection Time: 09/25/17  9:20 AM  Result Value Ref Range   WBC 10.1 4.0 - 10.5 K/uL   RBC 4.26 3.87 - 5.11 Mil/uL   Hemoglobin 13.4 12.0 - 15.0 g/dL   HCT 40.2 36.0 - 46.0 %   MCV 94.3 78.0 - 100.0 fl   MCHC 33.5 30.0 - 36.0 g/dL   RDW 13.6 11.5 - 15.5 %   Platelets 184.0 150.0 - 400.0 K/uL   Neutrophils Relative % 51.1 43.0 - 77.0 %   Lymphocytes Relative 40.4 12.0 - 46.0 %   Monocytes Relative 4.9 3.0 - 12.0 %   Eosinophils Relative 3.0 0.0 - 5.0 %   Basophils Relative 0.6 0.0 - 3.0 %   Neutro Abs 5.1 1.4 - 7.7 K/uL   Lymphs Abs 4.1 (H) 0.7 - 4.0 K/uL   Monocytes Absolute 0.5 0.1 - 1.0 K/uL   Eosinophils Absolute 0.3 0.0 - 0.7 K/uL   Basophils Absolute 0.1 0.0 - 0.1 K/uL    No results found.   ROS: 14 pt review of systems performed and negative (unless mentioned in an HPI)  Objective: BP 134/80 (BP Location: Right Arm, Patient Position: Sitting, Cuff  Size: Large)   Pulse 64   Temp 98 F (36.7 C)   Resp 20   Ht _0  (1.626 m)   Wt 148 lb 8 oz (67.4 kg)   SpO2 97%   BMI 25.49 kg/m   Gen: Afebrile. No acute distress. Nontoxic. HOH. Pleasant female.  HENT: AT. [place].MMM.  Eyes:Pupils Equal Round Reactive to light, Extraocular movements intact,  Conjunctiva without redness, discharge or icterus. CV: RRR no murmur, no edema, +2/4 P posterior tibialis pulses Chest: CTAB, no wheeze or crackles Abd: Soft. NTND. BS present.  Neuro:  Normal gait. PERLA. EOMi. Alert. Oriented x3  Psych: Normal affect, dress and demeanor. Normal speech. Normal thought content and judgment.  Assessment/plan: Madison Martinez is a 84 y.o. female present follow up  Essential hypertension/hyperlipidemia/CKD3 - improved.  - Continue Toprol XL 25 mg daily, lisinopril 30 mg daily and Hydralazine 25 mg TID.  Will eventually try to maximize lisinopril and remove hydralazine if able. - Continue pravastatin and krill oil.  - f/ u 6 mos   Return in about 6 months (around 05/25/2018) for HTN'. and dm  No orders of the defined types were placed  in this encounter.   Electronically signed by: Howard Pouch, DO [place]

## 2017-11-24 NOTE — Telephone Encounter (Signed)
[place] for [employer] # 10 tablets , she lives in [place].   We had ordered lumbar CT scan to look for restenosis at L4-5 .  Please proceed with test and ROV after scan. Call son to set up scan,  ? At  [facility] closest scanner to where she lives.

## 2017-11-24 NOTE — Telephone Encounter (Signed)
I spoke with son. Medication called to pharmacy.  Could you please check on CT Lumbar Spine that was entered to be done at [facility]? Patient nor her son have heard anything. Thanks.

## 2017-11-24 NOTE — Telephone Encounter (Signed)
I called Cordelia Pen and she is going to call pt to schedule CT.

## 2017-11-24 NOTE — Telephone Encounter (Signed)
Please advise. OK for refill? 

## 2017-11-27 ENCOUNTER — Other Ambulatory Visit (INDEPENDENT_AMBULATORY_CARE_PROVIDER_SITE_OTHER): Payer: Self-pay

## 2017-11-27 ENCOUNTER — Telehealth: Payer: Self-pay | Admitting: Family Medicine

## 2017-11-27 ENCOUNTER — Encounter: Payer: Self-pay | Admitting: Family Medicine

## 2017-11-27 ENCOUNTER — Telehealth (INDEPENDENT_AMBULATORY_CARE_PROVIDER_SITE_OTHER): Payer: Self-pay | Admitting: Radiology

## 2017-11-27 NOTE — Telephone Encounter (Signed)
Ok to call in

## 2017-11-27 NOTE — Telephone Encounter (Signed)
Called patient to schedule CT review. Patient was very confused as to why I was calling, I explained that she is having her CT scan tomorrow and Dr. Lorin Mercy wants her to come into the office for on another [place] to go over those results, patient would not make appointment because she did not understand. I told her I would send a message to Dr. Lorin Mercy assistant to call and explain, unsure if there is someone else who usually handles her appointments.  Patients phone number [phone]

## 2017-11-27 NOTE — Telephone Encounter (Signed)
Pt request discontinuation of [place] nursing from [facility]. Please cancel.

## 2017-11-27 NOTE — Telephone Encounter (Signed)
I left voicemail for patient's son, Max, advising his mother will need a follow up appointment in the office to review the CT scan scheduled for tomorrow. I asked him to call and schedule follow up with Dr. Lorin Mercy to review scan.

## 2017-11-27 NOTE — Telephone Encounter (Signed)
Rx sent to the pharm 11/24/17. Ok to do again. Not sure if you saw this before.

## 2017-11-28 ENCOUNTER — Ambulatory Visit (INDEPENDENT_AMBULATORY_CARE_PROVIDER_SITE_OTHER): Payer: PPO

## 2017-11-28 DIAGNOSIS — M5441 Lumbago with sciatica, right side: Principal | ICD-10-CM

## 2017-11-28 DIAGNOSIS — I700 Atherosclerosis of aorta: Secondary | ICD-10-CM | POA: Diagnosis not present

## 2017-11-28 DIAGNOSIS — M47896 Other spondylosis, lumbar region: Secondary | ICD-10-CM

## 2017-11-28 DIAGNOSIS — M47816 Spondylosis without myelopathy or radiculopathy, lumbar region: Secondary | ICD-10-CM | POA: Diagnosis not present

## 2017-11-28 DIAGNOSIS — G8929 Other chronic pain: Secondary | ICD-10-CM

## 2017-11-28 DIAGNOSIS — M5126 Other intervertebral disc displacement, lumbar region: Secondary | ICD-10-CM | POA: Diagnosis not present

## 2017-11-28 NOTE — Telephone Encounter (Signed)
Oh no, too soon

## 2017-11-29 NOTE — Telephone Encounter (Signed)
Spoke with Suanne Marker at [facility] verbal order to D/C home health and PT orders given . Suanne Marker states they will do discharge from [facility] and send over paperwork to be signed by Dr Raoul Pitch.

## 2017-12-08 ENCOUNTER — Encounter (INDEPENDENT_AMBULATORY_CARE_PROVIDER_SITE_OTHER): Payer: Self-pay | Admitting: Orthopaedic Surgery

## 2017-12-08 ENCOUNTER — Ambulatory Visit (INDEPENDENT_AMBULATORY_CARE_PROVIDER_SITE_OTHER): Payer: PPO | Admitting: Orthopaedic Surgery

## 2017-12-08 VITALS — Ht 64.0 in | Wt 148.0 lb

## 2017-12-08 DIAGNOSIS — M810 Age-related osteoporosis without current pathological fracture: Secondary | ICD-10-CM

## 2017-12-08 DIAGNOSIS — Z9889 Other specified postprocedural states: Secondary | ICD-10-CM | POA: Diagnosis not present

## 2017-12-08 NOTE — Progress Notes (Signed)
Office Visit Note   Patient: Madison Martinez           Date of Birth: 14-Aug-1927           MRN: [id number] Visit Date: 12/08/2017              Requested by: Ma Hillock, DO [id number] [place], [place] [address] PCP: Ma Hillock, DO   Assessment & Plan: Visit Diagnoses:  1. Hx of excision of lamina of lumbar vertebra for decompression of spinal cord   2. Osteoporosis, unspecified osteoporosis type, unspecified pathological fracture presence     Plan: Patient needs to use her walker to get up and walk more.  She has degenerative anterolisthesis at the L4-5 level post decompression without evidence on plain CT of significant central stenosis.  She has foraminal narrowing at several levels but denies pain.  She needs to use her walker to prevent falling, get up daily and work on walking to increase her stamina, strength and endurance.  This will also help her diabetes.  Her in.  Follow-Up Instructions: No follow-ups on file.   Orders:  No orders of the defined types were placed in this encounter.  No orders of the defined types were placed in this encounter.     Procedures: No procedures performed   Clinical Data: No additional findings.   Subjective: Chief Complaint  Patient presents with  . Lower Back - Pain, Follow-up    CT Lumbar Review    HPI 84 year old female returns post CT scan lumbar.  She denies pain she is had bilateral total hip arthroplasties that fracture on the right with cabling.  Previous lumbar decompression more than 15 years ago at L4-5.  She denies associated bowel or bladder symptoms.  She has a walker at home which she sometimes uses.  Review of Systems 14 point review of systems updated unchanged from 10/17/2017 office visit.   Objective: Vital Signs: Ht 5\' 4"  (1.626 m)   Wt 148 lb (67.1 kg)   BMI 25.40 kg/m   Physical Exam  Constitutional: She is oriented to person, place, and time. She appears well-developed.  HENT:  Head:  Normocephalic.  Right Ear: External ear normal.  Left Ear: External ear normal.  Eyes: Pupils are equal, round, and reactive to light.  Neck: No tracheal deviation present. No thyromegaly present.  Cardiovascular: Normal rate.  Pulmonary/Chest: Effort normal.  Abdominal: Soft.  Neurological: She is alert and oriented to person, place, and time.  Skin: Skin is warm and dry.  Psychiatric: She has a normal mood and affect. Her behavior is normal.    Ortho Exam well-healed lumbar incision.  Negative straight leg raising 90 degrees no pain with internal or external rotation of her hips.  Knees reach full extension she has some decreased strength bilaterally both extremities.  Specialty Comments:  No specialty comments available.  Imaging: CLINICAL DATA:  Patient has been having right sided lower back pains radiating into right leg x approx 3 months but seems to be getting worse, states that she has been having right hip pains as well, hx of hip surgery, denies any recent injury, no other complaints  EXAM: CT LUMBAR SPINE WITHOUT CONTRAST  TECHNIQUE: Multidetector CT imaging of the lumbar spine was performed without intravenous contrast administration. Multiplanar CT image reconstructions were also generated.  COMPARISON:  None.  FINDINGS: Segmentation: 5 lumbar type vertebrae.  Alignment: 8 mm anterolisthesis of L4 on L5 secondary to facet disease. Right L4  pars interarticularis defect. 3 mm retrolisthesis of L1 on L2, L2 on L3 and L3 on L4.  Vertebrae: Chronic T12 vertebral body compression fracture with approximately 50% height loss. Remainder the vertebral body heights are maintained. No aggressive osseous lesion. Mild osteoarthritis of bilateral sacroiliac joints.  Paraspinal and other soft tissues: No acute paraspinal abnormality. Abdominal aortic atherosclerosis.  Disc levels: Degenerative disc disease with disc height loss throughout the lumbar spine with  vacuum disc phenomenon. Prior laminectomy at L3-4 and L4-5.  T12-L1: Mild broad-based disc bulge. Moderate bilateral facet arthropathy. Left foraminal stenosis. No right foraminal stenosis.  L1-L2: Broad-based disc osteophyte complex. Mild bilateral facet arthropathy. Left foraminal stenosis. No right foraminal stenosis.  L2-L3: Broad-based disc osteophyte complex. Moderate bilateral facet arthropathy. Moderate spinal stenosis. Bilateral lateral recess stenosis. Severe right foraminal stenosis. Mild left foraminal stenosis.  L3-L4: Broad-based disc osteophyte complex. Severe bilateral facet arthropathy. Moderate-severe bilateral foraminal stenosis. Moderate spinal stenosis.  L4-L5: Broad-based disc bulge. Severe bilateral facet arthropathy. Suggestion of severe spinal stenosis. Severe bilateral foraminal stenosis.  L5-S1: Broad-based disc bulge. Moderate bilateral facet arthropathy. Mild bilateral foraminal stenosis.  IMPRESSION: 1. Diffuse lumbar spine spondylosis as described above. If there is further clinical concern regarding degree of foraminal or central canal stenosis recommend MRI. If the patient is unable to have an MRI, evaluation with a CT myelogram is recommended. 2.  Aortic Atherosclerosis (ICD10-I70.0).   Electronically Signed   By: Kathreen Devoid   On: 11/28/2017 11:45    PMFS History: Patient Active Problem List   Diagnosis Date Noted  . Right lumbar radiculopathy 09/25/2017  . At moderate risk for fall 09/25/2017  . Use of cane as ambulatory aid 12/16/2016  . Chronic kidney disease, stage 3 ([place]) 12/16/2016  . Hyperlipidemia   . Osteoporosis   . Polyneuropathy   . Spinal stenosis   . Hard of hearing 12/02/2015  . Monoclonal B-cell lymphocytosis 11/07/2013  . Blind hypertensive eye 04/18/2012  . Diabetes mellitus, type II ([place]) 03/06/2012  . Essential hypertension 03/06/2012  . Branch retinal vein occlusion 01/06/2012  . Primary open  angle glaucoma 01/06/2012  . Pseudophakia 01/06/2012  . Stable branch retinal vein occlusion 01/06/2012   Past Medical History:  Diagnosis Date  . Blind hypertensive eye, left 04/18/2012  . Gait disturbance 10/05/2015   pt declined neuro referral per records  . Hard of hearing 12/02/2015   hearing aids  . Hyperlipidemia   . Hypertension   . Insomnia    "tried melatonin, advil PM, xanax and klonopin without success"  . Leukocytosis 11/07/2013   seen oncology, did not want to continue to follow there, so continued follow up at PCP.  . Osteoporosis   . Polyneuropathy   . Primary open angle glaucoma 01/2011  . Spinal stenosis   . Stable branch retinal vein occlusion 01/06/2012  . Type 2 diabetes mellitus ([place])    "diet controlled"    Family History  Problem Relation Age of Onset  . Heart disease Brother     Past Surgical History:  Procedure Laterality Date  . EYE SURGERY    . [place]  2005  . TOTAL HIP ARTHROPLASTY Left 1991  . TOTAL HIP ARTHROPLASTY Right ?   x 2   Social History   Occupational History  . Not on file  Tobacco Use  . Smoking status: Never Smoker  . Smokeless tobacco: Never Used  Substance and Sexual Activity  . Alcohol use: No  . Drug use: No  .  Sexual activity: Never

## 2017-12-12 DIAGNOSIS — N3000 Acute cystitis without hematuria: Secondary | ICD-10-CM | POA: Diagnosis not present

## 2017-12-12 DIAGNOSIS — H1031 Unspecified acute conjunctivitis, right eye: Secondary | ICD-10-CM | POA: Diagnosis not present

## 2017-12-14 ENCOUNTER — Telehealth: Payer: Self-pay | Admitting: *Deleted

## 2017-12-14 NOTE — Telephone Encounter (Signed)
Home health orders from ,PT received to sign also Dc orders received to sign. Orders placed on Dr Lucita Lora desk for review.

## 2017-12-19 NOTE — Telephone Encounter (Signed)
Signed again. These were signed once already, before I left for vacation.  Given to staff to fax.

## 2017-12-22 DIAGNOSIS — H401111 Primary open-angle glaucoma, right eye, mild stage: Secondary | ICD-10-CM | POA: Diagnosis not present

## 2017-12-26 ENCOUNTER — Encounter: Payer: Self-pay | Admitting: Family Medicine

## 2017-12-26 ENCOUNTER — Ambulatory Visit (INDEPENDENT_AMBULATORY_CARE_PROVIDER_SITE_OTHER): Payer: PPO | Admitting: Family Medicine

## 2017-12-26 VITALS — BP 158/82 | HR 76 | Temp 98.4°F | Resp 16 | Wt 147.0 lb

## 2017-12-26 DIAGNOSIS — N3000 Acute cystitis without hematuria: Secondary | ICD-10-CM | POA: Diagnosis not present

## 2017-12-26 DIAGNOSIS — J0101 Acute recurrent maxillary sinusitis: Secondary | ICD-10-CM | POA: Diagnosis not present

## 2017-12-26 DIAGNOSIS — J029 Acute pharyngitis, unspecified: Secondary | ICD-10-CM

## 2017-12-26 DIAGNOSIS — R300 Dysuria: Secondary | ICD-10-CM | POA: Diagnosis not present

## 2017-12-26 MED ORDER — CEFDINIR 300 MG PO CAPS: 600.0000 mg | ORAL_CAPSULE | Freq: Every day | ORAL | 0 refills | Status: DC

## 2017-12-26 MED ORDER — FLUTICASONE PROPIONATE 50 MCG/ACT NA SUSP: 2.0000 | Freq: Every day | NASAL | 0 refills | Status: DC

## 2017-12-26 MED ORDER — GUAIFENESIN ER 600 MG PO TB12: 600.0000 mg | ORAL_TABLET | Freq: Two times a day (BID) | ORAL | 0 refills | Status: DC | PRN

## 2017-12-26 MED ORDER — AZITHROMYCIN 500 MG PO TABS: ORAL_TABLET | ORAL | 0 refills | Status: DC

## 2017-12-26 NOTE — Patient Instructions (Addendum)
Continue eye drops supplied by urgent care in both eyes.  Start azithromycin - 1 tab a [place] for 3 days.  Start omnicef 2 tabs (at same time) for 10 days.  Rest, hydrate.  Prescribed flonase and mucinex to use during illness to help with cough and symptoms.    If you are unable to produce urine, you will need to be seen at an urgent care.    Sinusitis, Adult Sinusitis is soreness and inflammation of your sinuses. Sinuses are hollow spaces in the bones around your face. They are located:  Around your eyes.  In the middle of your forehead.  Behind your nose.  In your cheekbones.  Your sinuses and nasal passages are lined with a stringy fluid (mucus). Mucus normally drains out of your sinuses. When your nasal tissues get inflamed or swollen, the mucus can get trapped or blocked so air cannot flow through your sinuses. This lets bacteria, viruses, and funguses grow, and that leads to infection. Follow these instructions at home: Medicines  Take, use, or apply over-the-counter and prescription medicines only as told by your doctor. These may include nasal sprays.  If you were prescribed an antibiotic medicine, take it as told by your doctor. Do not stop taking the antibiotic even if you start to feel better. Hydrate and Humidify  Drink enough water to keep your pee (urine) clear or pale yellow.  Use a cool mist humidifier to keep the humidity level in your home above 50%.  Breathe in steam for 10-15 minutes, 3-4 times a [place] or as told by your doctor. You can do this in the bathroom while a hot shower is running.  Try not to spend time in cool or dry air. Rest  Rest as much as possible.  Sleep with your head raised (elevated).  Make sure to get enough sleep each night. General instructions  Put a warm, moist washcloth on your face 3-4 times a [place] or as told by your doctor. This will help with discomfort.  Wash your hands often with soap and water. If there is no soap and  water, use hand sanitizer.  Do not smoke. Avoid being around people who are smoking (secondhand smoke).  Keep all follow-up visits as told by your doctor. This is important. Contact a doctor if:  You have a fever.  Your symptoms get worse.  Your symptoms do not get better within 10 days. Get help right away if:  You have a very bad headache.  You cannot stop throwing up (vomiting).  You have pain or swelling around your face or eyes.  You have trouble seeing.  You feel confused.  Your neck is stiff.  You have trouble breathing. This information is not intended to replace advice given to you by your health care provider. Make sure you discuss any questions you have with your health care provider. Document Released: 08/24/2007 Document Revised: 11/01/2015 Document Reviewed: 12/31/2014 Elsevier Interactive Patient Education  [employer].

## 2017-12-26 NOTE — Progress Notes (Signed)
Madison Martinez , 04-Mar-1928, 84 y.o., female MRN: [id number] Patient Care Team    Relationship Specialty Notifications Start End  Ma Hillock, DO PCP - General Family Medicine  09/01/16   Verdell Carmine, MD Referring Physician Internal Medicine  09/01/16   Renelda Loma, OD  Optometry  09/01/16   [facility], [facility]  Ophthalmology  06/16/17   Manuela Neptune, DPM Referring Physician Podiatry  06/16/17     Chief Complaint  Patient presents with  . URI    cough, HA, sore throat     Subjective: Pt presents for an OV with complaints of dysuria and URI of  16 [place] duration.  She was seen in the urgent care 12/12/2017 and diagnosed with a E.coli UTI which was pansensitive with the exception of tetracycline.  She was also diagnosed with acute conjunctivitis of her right eye and provided with Bleph-10 eyedrops.  She was treated with Augmentin twice daily for the UTI.  She reports none of her symptoms are better today.  She felt like she was getting a little better during the antibiotic use, and after completion her symptoms returned stronger.  She endorses a cough that is dry, and headache, sore throat, facial pressure and dysuria.  Depression screen [facility] 2/9 11/24/2017 06/16/2017 09/01/2016  Decreased Interest 0 0 0  Down, Depressed, Hopeless 0 0 0  PHQ - 2 Score 0 0 0    Allergies  Allergen Reactions  . Amlodipine Besylate     Other reaction(s): Vertigo  . Flu Virus Vaccine Swelling   Social History   Tobacco Use  . Smoking status: Never Smoker  . Smokeless tobacco: Never Used  Substance Use Topics  . Alcohol use: No   Past Medical History:  Diagnosis Date  . Blind hypertensive eye, left 04/18/2012  . Gait disturbance 10/05/2015   pt declined neuro referral per records  . Hard of hearing 12/02/2015   hearing aids  . Hyperlipidemia   . Hypertension   . Insomnia    "tried melatonin, advil PM, xanax and klonopin without success"  . Leukocytosis 11/07/2013   seen oncology,  did not want to continue to follow there, so continued follow up at PCP.  . Osteoporosis   . Polyneuropathy   . Primary open angle glaucoma 01/2011  . Spinal stenosis   . Stable branch retinal vein occlusion 01/06/2012  . Type 2 diabetes mellitus ([place])    "diet controlled"   Past Surgical History:  Procedure Laterality Date  . EYE SURGERY    . [place]  2005  . TOTAL HIP ARTHROPLASTY Left 1991  . TOTAL HIP ARTHROPLASTY Right ?   x 2   Family History  Problem Relation Age of Onset  . Heart disease Brother    Allergies as of 12/26/2017      Reactions   Amlodipine Besylate    Other reaction(s): Vertigo   Flu Virus Vaccine Swelling      Medication List        Accurate as of 12/26/17 11:59 PM. Always use your most recent med list.          aspirin 81 MG EC tablet Take 1 tablet by mouth daily.   azithromycin 500 MG tablet Commonly known as:  ZITHROMAX 1 tab daily for 3 days   cefdinir 300 MG capsule Commonly known as:  OMNICEF Take 2 capsules (600 mg total) by mouth daily.   dorzolamide-timolol 22.3-6.8 MG/ML ophthalmic solution Commonly known as:  COSOPT  Place 1 drop into the right eye 2 (two) times daily.   fluticasone 50 MCG/ACT nasal spray Commonly known as:  FLONASE Place 2 sprays into both nostrils daily.   gabapentin 300 MG capsule Commonly known as:  NEURONTIN Take 2 capsules (600 mg total) by mouth 2 (two) times daily.   guaiFENesin 600 MG 12 hr tablet Commonly known as:  MUCINEX Take 1 tablet (600 mg total) by mouth 2 (two) times daily as needed.   hydrALAZINE 25 MG tablet Commonly known as:  APRESOLINE Take 1 tablet (25 mg total) by mouth every 8 (eight) hours. Needs office visit prior to anymore refills   KRILL OIL PO Take by mouth.   latanoprost 0.005 % ophthalmic solution Commonly known as:  XALATAN Place 1 drop into the left eye at bedtime.   lisinopril 30 MG tablet Commonly known as:  PRINIVIL,ZESTRIL Take 1 tablet (30 mg total)  by mouth daily.   MELATONIN PO Take by mouth.   metoprolol succinate 25 MG 24 hr tablet Commonly known as:  TOPROL-XL Take 1 tablet (25 mg total) by mouth daily.   pravastatin 10 MG tablet Commonly known as:  PRAVACHOL Take 1 tablet (10 mg total) by mouth daily.   sulfacetamide 10 % ophthalmic solution Commonly known as:  BLEPH-10 Place one drop into the right eye every 4 (four) hours for 7 days.   traMADol 50 MG tablet Commonly known as:  ULTRAM Take 1 tablet (50 mg total) by mouth 2 (two) times daily.   VITAMIN D-1000 MAX ST 1000 units tablet Generic drug:  Cholecalciferol Take 2 tablets by mouth daily.       All past medical history, surgical history, allergies, family history, immunizations andmedications were updated in the EMR today and reviewed under the history and medication portions of their EMR.     ROS: Negative, with the exception of above mentioned in HPI   Objective:  BP (!) 158/82 (BP Location: Left Arm, Patient Position: Sitting, Cuff Size: Normal)   Pulse 76   Temp 98.4 F (36.9 C) (Oral)   Resp 16   Wt 147 lb (66.7 kg)   SpO2 95%   BMI 25.23 kg/m  Body mass index is 25.23 kg/m. Gen: Afebrile. No acute distress. Nontoxic in appearance, well developed, well nourished.  HENT: AT. [place]. Bilateral TM visualized no erythema, bilateral fullness appreciated.. MMM, no oral lesions. Bilateral nares with erythema, swelling and drainage. Throat without erythema or exudates.  Cough present.  Hoarseness present.  Tender to palpation maxillary sinuses. Eyes:Pupils Equal Round Reactive to light, Extraocular movements intact,  Conjunctiva without redness, crusty thick drainage bilaterally . Neck/lymp/endocrine: Supple, bilateral anterior cervical lymphadenopathy present. CV: RRR  Chest: CTAB, no wheeze or crackles. Good air movement, normal resp effort.  Abd: Soft. NTND. BS +.  MSK: No CVA tenderness bilaterally Skin: No rashes, purpura or petechiae.  Neuro:   Normal gait. PERLA. EOMi. Alert. Oriented x3    Assessment/Plan: [name] is a 84 y.o. female present for OV for  Dysuria/Acute cystitis without hematuria - POCT Urinalysis Dipstick (Automated)--> unable to urinate. She has urinated today just before she came in the office. Discussed return precautions if unable to void.  - will cover with omnicef (see below)  Sore throat/Acute recurrent maxillary sinusitis - POCT rapid strep A--> negative - Upper Respiratory Culture Rest, hydrate. Cleanse eyes with warm/soapy clean cloth 2-3x a [place] Continue belph-10 eye drops.  + flonase, mucinex (DM if cough)--> prescribed, nettie pot or  nasal saline.  azith x3 and omnicef prescribed, take until completed.  If cough present it can last up to 6-8 weeks.  F/U 2 weeks of not improved. Sooner if worsening.    Reviewed expectations re: course of current medical issues.  Discussed self-management of symptoms.  Outlined signs and symptoms indicating need for more acute intervention.  Patient verbalized understanding and all questions were answered.  Patient received an After-Visit Summary.    Orders Placed This Encounter  Procedures  . Upper Respiratory Culture  . POCT rapid strep A     Note is dictated utilizing voice recognition software. Although note has been proof read prior to signing, occasional typographical errors still can be missed. If any questions arise, please do not hesitate to call for verification.   electronically signed by:  Howard Pouch, DO  [place]

## 2017-12-27 ENCOUNTER — Encounter: Payer: Self-pay | Admitting: Family Medicine

## 2017-12-28 LAB — POCT RAPID STREP A (OFFICE): RAPID STREP A SCREEN: NEGATIVE

## 2017-12-29 LAB — CULTURE, UPPER RESPIRATORY
MICRO NUMBER:: 91208879
SPECIMEN QUALITY: ADEQUATE

## 2018-01-12 DIAGNOSIS — H4052X3 Glaucoma secondary to other eye disorders, left eye, severe stage: Secondary | ICD-10-CM | POA: Diagnosis not present

## 2018-01-12 LAB — HM DIABETES EYE EXAM

## 2018-01-22 ENCOUNTER — Telehealth: Payer: Self-pay | Admitting: *Deleted

## 2018-01-22 NOTE — Telephone Encounter (Signed)
Home health discharge orders placed on Dr Lucita Lora desk for signature.

## 2018-01-22 NOTE — Telephone Encounter (Signed)
Faxed signed orders copy sent to scan.

## 2018-01-22 NOTE — Telephone Encounter (Signed)
I have signed these dc papers multiple times. In the future copy this particular time and send back to them.

## 2018-03-17 ENCOUNTER — Other Ambulatory Visit: Payer: Self-pay | Admitting: Family Medicine

## 2018-03-17 DIAGNOSIS — I10 Essential (primary) hypertension: Secondary | ICD-10-CM

## 2018-04-27 ENCOUNTER — Other Ambulatory Visit: Payer: Self-pay

## 2018-04-27 MED ORDER — PRAVASTATIN SODIUM 10 MG PO TABS: 10.0000 mg | ORAL_TABLET | Freq: Every day | ORAL | 0 refills | Status: DC

## 2018-05-25 ENCOUNTER — Ambulatory Visit (INDEPENDENT_AMBULATORY_CARE_PROVIDER_SITE_OTHER): Payer: Medicare Other | Admitting: Family Medicine

## 2018-05-25 ENCOUNTER — Encounter: Payer: Self-pay | Admitting: Family Medicine

## 2018-05-25 VITALS — BP 137/71 | HR 77 | Temp 97.9°F | Resp 16 | Ht 64.0 in | Wt 143.0 lb

## 2018-05-25 DIAGNOSIS — E785 Hyperlipidemia, unspecified: Secondary | ICD-10-CM

## 2018-05-25 DIAGNOSIS — I10 Essential (primary) hypertension: Secondary | ICD-10-CM | POA: Diagnosis not present

## 2018-05-25 DIAGNOSIS — E119 Type 2 diabetes mellitus without complications: Secondary | ICD-10-CM | POA: Diagnosis not present

## 2018-05-25 DIAGNOSIS — D72820 Lymphocytosis (symptomatic): Secondary | ICD-10-CM | POA: Diagnosis not present

## 2018-05-25 DIAGNOSIS — N183 Chronic kidney disease, stage 3 unspecified: Secondary | ICD-10-CM

## 2018-05-25 LAB — LIPID PANEL
Cholesterol: 248 mg/dL — ABNORMAL HIGH (ref 0–200)
HDL: 35.4 mg/dL — ABNORMAL LOW (ref >39.00)
Total CHOL/HDL Ratio: 7
Triglycerides: 548 mg/dL — ABNORMAL HIGH (ref 0.0–149.0)

## 2018-05-25 LAB — COMPREHENSIVE METABOLIC PANEL
ALBUMIN: 4.4 g/dL (ref 3.5–5.2)
ALT: 13 U/L (ref 0–35)
AST: 20 U/L (ref 0–37)
Alkaline Phosphatase: 88 U/L (ref 39–117)
BILIRUBIN TOTAL: 0.5 mg/dL (ref 0.2–1.2)
BUN: 18 mg/dL (ref 6–23)
CALCIUM: 10.1 mg/dL (ref 8.4–10.5)
CHLORIDE: 102 meq/L (ref 96–112)
CO2: 28 meq/L (ref 19–32)
CREATININE: 1.08 mg/dL (ref 0.40–1.20)
GFR: 47.79 mL/min — AB (ref >60.00)
Glucose, Bld: 164 mg/dL — ABNORMAL HIGH (ref 70–99)
Potassium: 4.3 mEq/L (ref 3.5–5.1)
Sodium: 139 mEq/L (ref 135–145)
Total Protein: 7.3 g/dL (ref 6.0–8.3)

## 2018-05-25 LAB — CBC
HCT: 39.8 % (ref 36.0–46.0)
Hemoglobin: 13.5 g/dL (ref 12.0–15.0)
MCHC: 33.9 g/dL (ref 30.0–36.0)
MCV: 91.9 fl (ref 78.0–100.0)
Platelets: 176 10*3/uL (ref 150.0–400.0)
RBC: 4.33 Mil/uL (ref 3.87–5.11)
RDW: 13.4 % (ref 11.5–15.5)
WBC: 11 10*3/uL — ABNORMAL HIGH (ref 4.0–10.5)

## 2018-05-25 LAB — TSH: TSH: 3.92 u[IU]/mL (ref 0.35–4.50)

## 2018-05-25 LAB — LDL CHOLESTEROL, DIRECT: Direct LDL: 107 mg/dL

## 2018-05-25 LAB — VITAMIN D 25 HYDROXY (VIT D DEFICIENCY, FRACTURES): VITD: 44.1 ng/mL (ref 30.00–100.00)

## 2018-05-25 LAB — HEMOGLOBIN A1C: HEMOGLOBIN A1C: 6.6 % — AB (ref 4.6–6.5)

## 2018-05-25 MED ORDER — LISINOPRIL 30 MG PO TABS: 30.0000 mg | ORAL_TABLET | Freq: Every day | ORAL | 1 refills | Status: DC

## 2018-05-25 MED ORDER — METOPROLOL SUCCINATE ER 25 MG PO TB24: 25.0000 mg | ORAL_TABLET | Freq: Every day | ORAL | 1 refills | Status: DC

## 2018-05-25 MED ORDER — HYDRALAZINE HCL 25 MG PO TABS: ORAL_TABLET | ORAL | 1 refills | Status: DC

## 2018-05-25 MED ORDER — GABAPENTIN 300 MG PO CAPS: 600.0000 mg | ORAL_CAPSULE | Freq: Two times a day (BID) | ORAL | 5 refills | Status: DC

## 2018-05-25 MED ORDER — PRAVASTATIN SODIUM 10 MG PO TABS: 10.0000 mg | ORAL_TABLET | Freq: Every day | ORAL | 3 refills | Status: DC

## 2018-05-25 NOTE — Patient Instructions (Addendum)
You look great! I will call you with lab results once available.   Follow in 6 months.    Please help [place] help you:  We are honored you have chosen [facility] for your Primary Care home. Below you will find basic instructions that you may need to access in the future. Please help [place] help you by reading the instructions, which cover many of the frequent questions we experience.   Prescription refills and request:  -In order to allow more efficient response time, please call your pharmacy for all refills. They will forward the request electronically to [place]. This allows for the quickest possible response. Request left on a nurse line can take longer to refill, since these are checked as time allows between office patients and other phone calls.  - refill request can take up to 3-5 working days to complete.  - If request is sent electronically and request is appropiate, it is usually completed in 1-2 business days.  - all patients will need to be seen routinely for all chronic medical conditions requiring prescription medications (see follow-up below). If you are overdue for follow up on your condition, you will be asked to make an appointment and we will call in enough medication to cover you until your appointment (up to 30 days).  - all controlled substances will require a face to face visit to request/refill.  - if you desire your prescriptions to go through a new pharmacy, and have an active script at original pharmacy, you will need to call your pharmacy and have scripts transferred to new pharmacy. This is completed between the pharmacy locations and not by your provider.    Results: If any images or labs were ordered, it can take up to 1 week to get results depending on the test ordered and the lab/facility running and resulting the test. - Normal or stable results, which do not need further discussion, may be released to your mychart immediately with attached note to you. A call may not  be generated for normal results. Please make certain to sign up for mychart. If you have questions on how to activate your mychart you can call the front office.  - If your results need further discussion, our office will attempt to contact you via phone, and if unable to reach you after 2 attempts, we will release your abnormal result to your mychart with instructions.  - All results will be automatically released in mychart after 1 week.  - Your provider will provide you with explanation and instruction on all relevant material in your results. Please keep in mind, results and labs may appear confusing or abnormal to the untrained eye, but it does not mean they are actually abnormal for you personally. If you have any questions about your results that are not covered, or you desire more detailed explanation than what was provided, you should make an appointment with your provider to do so.   Our office handles many outgoing and incoming calls daily. If we have not contacted you within 1 week about your results, please check your mychart to see if there is a message first and if not, then contact our office.  In helping with this matter, you help decrease call volume, and therefore allow [place] to be able to respond to patients needs more efficiently.   Acute office visits (sick visit):  An acute visit is intended for a new problem and are scheduled in shorter time slots to allow schedule openings  for patients with new problems. This is the appropriate visit to discuss a new problem. Problems will not be addressed by phone call or Echart message. Appointment is needed if requesting treatment. In order to provide you with excellent quality medical care with proper time for you to explain your problem, have an exam and receive treatment with instructions, these appointments should be limited to one new problem per visit. If you experience a new problem, in which you desire to be addressed, please make an acute  office visit, we save openings on the schedule to accommodate you. Please do not save your new problem for any other type of visit, let us take care of it properly and quickly for you.   Follow up visits:  Depending on your condition(s) your provider will need to see you routinely in order to provide you with quality care and prescribe medication(s). Most chronic conditions (Example: hypertension, Diabetes, depression/anxiety... etc), require visits a couple times a year. Your provider will instruct you on proper follow up for your personal medical conditions and history. Please make certain to make follow up appointments for your condition as instructed. Failing to do so could result in lapse in your medication treatment/refills. If you request a refill, and are overdue to be seen on a condition, we will always provide you with a 30 Friedl script (once) to allow you time to schedule.    Medicare wellness (well visit): - we have a wonderful Nurse Maudie Mercury), that will meet with you and provide you will yearly medicare wellness visits. These visits should occur yearly (can not be scheduled less than 1 calendar year apart) and cover preventive health, immunizations, advance directives and screenings you are entitled to yearly through your medicare benefits. Do not miss out on your entitled benefits, this is when medicare will pay for these benefits to be ordered for you.  These are strongly encouraged by your provider and is the appropriate type of visit to make certain you are up to date with all preventive health benefits. If you have not had your medicare wellness exam in the last 12 months, please make certain to schedule one by calling the office and schedule your medicare wellness with Maudie Mercury as soon as possible.   Yearly physical (well visit):  - Adults are recommended to be seen yearly for physicals. Check with your insurance and date of your last physical, most insurances require one calendar year between  physicals. Physicals include all preventive health topics, screenings, medical exam and labs that are appropriate for gender/age and history. You may have fasting labs needed at this visit. This is a well visit (not a sick visit), new problems should not be covered during this visit (see acute visit).  - Pediatric patients are seen more frequently when they are younger. Your provider will advise you on well child visit timing that is appropriate for your their age. - This is not a medicare wellness visit. Medicare wellness exams do not have an exam portion to the visit. Some medicare companies allow for a physical, some do not allow a yearly physical. If your medicare allows a yearly physical you can schedule the medicare wellness with our nurse Maudie Mercury and have your physical with your provider after, on the same [place]. Please check with insurance for your full benefits.   Late Policy/No Shows:  - all new patients should arrive 15-30 minutes earlier than appointment to allow [place] time  to  obtain all personal demographics,  insurance information and  for you to complete office paperwork. - All established patients should arrive 10-15 minutes earlier than appointment time to update all information and be checked in .  - In our best efforts to run on time, if you are late for your appointment you will be asked to either reschedule or if able, we will work you back into the schedule. There will be a wait time to work you back in the schedule,  depending on availability.  - If you are unable to make it to your appointment as scheduled, please call 24 hours ahead of time to allow [place] to fill the time slot with someone else who needs to be seen. If you do not cancel your appointment ahead of time, you may be charged a no show fee.

## 2018-05-25 NOTE — Progress Notes (Signed)
Madison Martinez     Patient ID: Madison Martinez, female  DOB: 1927-07-15, 85 y.o.   MRN: [id number] Patient Care Team    Relationship Specialty Notifications Start End  Ma Hillock, DO PCP - General Family Medicine  09/01/16   Verdell Carmine, MD Referring Physician Internal Medicine  09/01/16   Renelda Loma, OD  Optometry  09/01/16   [facility], [facility]  Ophthalmology  06/16/17   Manuela Neptune, DPM Referring Physician Podiatry  06/16/17     Chief Complaint  Patient presents with  . Diabetes    Not fasting.   . Chronic Kidney Disease  . Hypertension    Subjective:  Madison Martinez is a 85 y.o.  female present for follow up  Essential hypertension/hyperlipidemia/CKD3 Pt reports compliance with Metoprolol-XL 25 mg daily, hydralazine 25 mg TID  and lisinopril 30 mg daily. Patient denies chest pain, shortness of breath, dizziness or lower extremity edema.  She is taking statin and has added Krill oil.  BMP: 09/25/2017, GFR 49 Lipids: 09/14/2016, total cholesterol 250, could not calculate LDL secondary to triglycerides of 632. Patient reports she was fasting overnight.  CBC: 09/25/2017 wnl Diet: Monitors her diet, low-sodium Exercise: Does not exercise much, walks with a cane RF: Hypertension, hyperlipidemia, family history of heart disease  Type 2 diabetes mellitus without complication, without long-term current use of insulin (HCC) Last a1c 6.5 and glucose > 200. She has been diet controlled for some time. She had been on metformin at one time and then Dcd and diet control.   Monoclonal B-cell lymphocytosis She is suppose to follow with her heme/onc every 3 months- but has not in some time. CBC has been stable here.    Depression screen [facility] 2/9 05/25/2018 11/24/2017 06/16/2017 09/01/2016  Decreased Interest 0 0 0 0  Down, Depressed, Hopeless 0 0 0 0  PHQ - 2 Score 0 0 0 0   No flowsheet data found.     Fall Risk  05/25/2018 11/24/2017 06/16/2017 09/01/2016  Falls in the past year? 0 No No No    Risk for fall due to : - - - Impaired balance/gait;Impaired mobility  Follow up Falls evaluation completed;Education provided - - -    There is no immunization history on file for this patient.  No exam data present  Past Medical History:  Diagnosis Date  . Blind hypertensive eye, left 04/18/2012  . Gait disturbance 10/05/2015   pt declined neuro referral per records  . Hard of hearing 12/02/2015   hearing aids  . Hyperlipidemia   . Hypertension   . Insomnia    "tried melatonin, advil PM, xanax and klonopin without success"  . Leukocytosis 11/07/2013   seen oncology, did not want to continue to follow there, so continued follow up at PCP.  . Osteoporosis   . Polyneuropathy   . Primary open angle glaucoma 01/2011  . Spinal stenosis   . Stable branch retinal vein occlusion 01/06/2012  . Type 2 diabetes mellitus ([place])    "diet controlled"   Allergies  Allergen Reactions  . Amlodipine Besylate     Other reaction(s): Vertigo  . Flu Virus Vaccine Swelling   Past Surgical History:  Procedure Laterality Date  . EYE SURGERY    . [place]  2005  . TOTAL HIP ARTHROPLASTY Left 1991  . TOTAL HIP ARTHROPLASTY Right ?   x 2   Family History  Problem Relation Age of Onset  . Heart disease Brother  Social History   Socioeconomic History  . Marital status: Divorced    Spouse name: Not on file  . Number of children: 3  . Years of education: 73  . Highest education level: Not on file  Occupational History  . Not on file  Social Needs  . Financial resource strain: Not on file  . Food insecurity:    Worry: Not on file    Inability: Not on file  . Transportation needs:    Medical: Not on file    Non-medical: Not on file  Tobacco Use  . Smoking status: Never Smoker  . Smokeless tobacco: Never Used  Substance and Sexual Activity  . Alcohol use: No  . Drug use: No  . Sexual activity: Never  Lifestyle  . Physical activity:    Days per week: Not on file     Minutes per session: Not on file  . Stress: Not on file  Relationships  . Social connections:    Talks on phone: Not on file    Gets together: Not on file    Attends religious service: Not on file    Active member of club or organization: Not on file    Attends meetings of clubs or organizations: Not on file    Relationship status: Not on file  . Intimate partner violence:    Fear of current or ex partner: Not on file    Emotionally abused: Not on file    Physically abused: Not on file    Forced sexual activity: Not on file  Other Topics Concern  . Not on file  Social History Narrative   Divorced. 3 children [name], Max and [name]. Lives alone.   High school graduate.   Takes a daily vitamin.   Wears her seatbelt. Wears a hearing aid. Wears dentures.   Requires a cane for assistive device for walking.   Smoke detector in the home.   Feels safe in her relationships.   Allergies as of 05/25/2018      Reactions   Amlodipine Besylate    Other reaction(s): Vertigo   Flu Virus Vaccine Swelling      Medication List       Accurate as of May 25, 2018  9:57 AM. Always use your most recent med list.        aspirin 81 MG EC tablet Take 1 tablet by mouth daily.   dorzolamide-timolol 22.3-6.8 MG/ML ophthalmic solution Commonly known as:  COSOPT Place 1 drop into the right eye 2 (two) times daily.   gabapentin 300 MG capsule Commonly known as:  NEURONTIN Take 2 capsules (600 mg total) by mouth 2 (two) times daily.   hydrALAZINE 25 MG tablet Commonly known as:  APRESOLINE TAKE 1 TABLET BY MOUTH EVERY 8 HOURS. NEED  APPOINTMENT  FOR  FURTHER  REFILLS   KRILL OIL PO Take by mouth.   latanoprost 0.005 % ophthalmic solution Commonly known as:  XALATAN Place 1 drop into the left eye at bedtime.   lisinopril 30 MG tablet Commonly known as:  PRINIVIL,ZESTRIL Take 1 tablet (30 mg total) by mouth daily.   MELATONIN PO Take by mouth.   metoprolol succinate 25 MG 24 hr  tablet Commonly known as:  TOPROL-XL Take 1 tablet (25 mg total) by mouth daily.   pravastatin 10 MG tablet Commonly known as:  PRAVACHOL Take 1 tablet (10 mg total) by mouth daily.   Vitamin D-1000 Max St 25 MCG (1000 UT) tablet Generic drug:  Cholecalciferol  Take 2 tablets by mouth daily.       All past medical history, surgical history, allergies, family history, immunizations andmedications were updated in the EMR today and reviewed under the history and medication portions of their EMR.    No results found for this or any previous visit (from the past 2160 hour(s)).  No results found.   ROS: 14 pt review of systems performed and negative (unless mentioned in an HPI)  Objective: BP 137/71 (BP Location: Left Arm, Patient Position: Sitting, Cuff Size: Normal)   Pulse 77   Temp 97.9 F (36.6 C) (Oral)   Resp 16   Ht _0  (1.626 m)   Wt 143 lb (64.9 kg)   SpO2 96%   BMI 24.55 kg/m   Gen: Afebrile. No acute distress. Nontoxic in appearance. Well developed, well nourished. HOH. Very pleasant caucasian female.  HENT: AT. [place]. MMM.  Eyes:Pupils Equal Round Reactive to light, Extraocular movements intact,  Conjunctiva without redness, discharge or icterus. Neck/lymp/endocrine: Supple,no lymphadenopathy, no thyromegaly CV: RRR no murmur, no edema, +2/4 P posterior tibialis pulses. No carotid bruit.  Chest: CTAB, no wheeze or crackles Abd: Soft. NTND. BS present  Neuro:  Normal gait. PERLA. EOMi. Alert. Oriented x3  Psych: Normal affect, dress and demeanor. Normal speech. Normal thought content and judgment.    Assessment/plan: Alexie Lanni Faria is a 85 y.o. female present follow up  Essential hypertension/hyperlipidemia/CKD3 - Stable. Good control, especially for her.  - Continue Toprol XL 25 mg daily, lisinopril 30 mg daily and Hydralazine 25 mg TID.   - Continue pravastatin  - cbc, cmp, tsh, lipids collected today - f/ u 6 mos  Type 2 diabetes mellitus without  complication, without long-term current use of insulin (HCC) - diet controlled. Last a1c 6.5--> elected to not start med yet. Retest today. If increased would start renally dosed medication.  - HgB A1c - exercise routinely, low sugar diet.    Monoclonal B-cell lymphocytosis - has heme/onc--> she does not follow as suggested. She has been encouraged to follow up. - CBC   Return in about 6 months (around 11/25/2018) for [facility].   Orders Placed This Encounter  Procedures  . Comp Met (CMET)  . HgB A1c  . CBC  . TSH  . Lipid panel  . Vitamin D (25 hydroxy)  . PTH, Intact and Calcium   > 25 minutes spent with patient, >50% of time spent face to face    Electronically signed by: Howard Pouch, [place]

## 2018-05-28 ENCOUNTER — Encounter: Payer: Self-pay | Admitting: Family Medicine

## 2018-05-28 ENCOUNTER — Telehealth: Payer: Self-pay | Admitting: Family Medicine

## 2018-05-28 LAB — PTH, INTACT AND CALCIUM
Calcium: 10 mg/dL (ref 8.6–10.4)
PTH: 29 pg/mL (ref 14–64)

## 2018-05-28 MED ORDER — GLIPIZIDE ER 2.5 MG PO TB24: 2.5000 mg | ORAL_TABLET | Freq: Every day | ORAL | 1 refills | Status: DC

## 2018-05-28 NOTE — Telephone Encounter (Signed)
Called and spoke with Patients son, okay per DPR, pts son verbalized understanding on all lab results and new medication pt is to take.

## 2018-05-28 NOTE — Telephone Encounter (Signed)
Please inform patient the following information: Her labs were all stable with the exception of her a1c- diabetes- is elevated to 6.6. We do need to start a very low dose medicine called glipizide for her to take with her breakfast or first meal of the [place].  She was not fasting- so her triglycerides were elevated- but the rest of her cholesterol panel was normal.

## 2018-06-22 ENCOUNTER — Ambulatory Visit: Payer: PPO

## 2018-08-03 ENCOUNTER — Ambulatory Visit (INDEPENDENT_AMBULATORY_CARE_PROVIDER_SITE_OTHER): Payer: Medicare Other | Admitting: Family Medicine

## 2018-08-03 ENCOUNTER — Other Ambulatory Visit: Payer: Self-pay

## 2018-08-03 ENCOUNTER — Encounter: Payer: Self-pay | Admitting: Family Medicine

## 2018-08-03 ENCOUNTER — Ambulatory Visit: Payer: Medicare Other

## 2018-08-03 VITALS — Ht 64.0 in

## 2018-08-03 DIAGNOSIS — G3184 Mild cognitive impairment, so stated: Secondary | ICD-10-CM | POA: Diagnosis not present

## 2018-08-03 DIAGNOSIS — H9190 Unspecified hearing loss, unspecified ear: Secondary | ICD-10-CM

## 2018-08-03 DIAGNOSIS — M810 Age-related osteoporosis without current pathological fracture: Secondary | ICD-10-CM | POA: Diagnosis not present

## 2018-08-03 DIAGNOSIS — Z0000 Encounter for general adult medical examination without abnormal findings: Secondary | ICD-10-CM | POA: Diagnosis not present

## 2018-08-03 NOTE — Progress Notes (Signed)
VIRTUAL VISIT VIA VIDEO- MW  I connected with Yolinda M Camps  on 08/03/18  by a video enabled telemedicine application and verified that I am speaking with the correct person using two identifiers. Location patient: Home Location provider: [facility], Office Persons participating in the virtual visit: Patient, Dr. Raoul Pitch and R.Baker, LPN  I discussed the limitations of evaluation and management by telemedicine and the availability of in person appointments. The patient expressed understanding and agreed to proceed.    Medicare AWV Chief Complaint  Patient presents with  . Medicare Wellness    No complaints or concerns. Pt no longer gets mammograms. Pt has a Will and son is POA.     Patient Care Team    Relationship Specialty Notifications Start End  Ma Hillock, DO PCP - General Family Medicine  09/01/16   Verdell Carmine, MD Referring Physician Internal Medicine  09/01/16   Renelda Loma, OD  Optometry  09/01/16   Associates, [facility]  Ophthalmology  06/16/17   Manuela Neptune, DPM Referring Physician Podiatry  06/16/17      History of Present Ilness: Madison Martinez, 85 y.o. , female presents today for Medicare wellness visit.  No complaints. Today is her 38 Birthday!!!  Ht 5\' 4"  (1.626 m)   BMI 24.55 kg/m  pt unable to obtain vitals at home.   Past medical, surgical, family and social histories reviewed (including experiences with illnesses, hospital stays, operations, injuries, and treatments):  Past Medical History:  Diagnosis Date  . Blind hypertensive eye, left 04/18/2012  . Gait disturbance 10/05/2015   pt declined neuro referral per records  . Hard of hearing 12/02/2015   hearing aids  . Hyperlipidemia   . Hypertension   . Insomnia    "tried melatonin, advil PM, xanax and klonopin without success"  . Leukocytosis 11/07/2013   seen oncology, did not want to continue to follow there, so continued follow up at PCP.  . Osteoporosis   . Polyneuropathy    . Primary open angle glaucoma 01/2011  . Spinal stenosis   . Stable branch retinal vein occlusion 01/06/2012  . Type 2 diabetes mellitus ([place])    "diet controlled"   All allergies reviewed Allergies  Allergen Reactions  . Amlodipine Besylate     Other reaction(s): Vertigo  . Flu Virus Vaccine Swelling   Past Surgical History:  Procedure Laterality Date  . EYE SURGERY    . [place]  2005  . TOTAL HIP ARTHROPLASTY Left 1991  . TOTAL HIP ARTHROPLASTY Right ?   x 2   Family History  Problem Relation Age of Onset  . Heart disease Brother    Social History   Social History Narrative   Divorced. 3 children [name], Max and [name]. Lives alone.   High school graduate.   Takes a daily vitamin.   Wears her seatbelt. Wears a hearing aid. Wears dentures.   Requires a cane for assistive device for walking.   Smoke detector in the home.   Feels safe in her relationships.    All medications verified Allergies as of 08/03/2018      Reactions   Amlodipine Besylate    Other reaction(s): Vertigo   Flu Virus Vaccine Swelling      Medication List       Accurate as of Aug 03, 2018  3:02 PM. If you have any questions, ask your nurse or doctor.        aspirin 81 MG  EC tablet Take 1 tablet by mouth daily.   dorzolamide-timolol 22.3-6.8 MG/ML ophthalmic solution Commonly known as:  COSOPT Place 1 drop into the right eye 2 (two) times daily.   gabapentin 300 MG capsule Commonly known as:  NEURONTIN Take 2 capsules (600 mg total) by mouth 2 (two) times daily.   glipiZIDE 2.5 MG 24 hr tablet Commonly known as:  GLUCOTROL XL Take 1 tablet (2.5 mg total) by mouth daily with breakfast.   hydrALAZINE 25 MG tablet Commonly known as:  APRESOLINE TAKE 1 TABLET BY MOUTH EVERY 8 HOURS. NEED  APPOINTMENT  FOR  FURTHER  REFILLS   KRILL OIL PO Take by mouth.   latanoprost 0.005 % ophthalmic solution Commonly known as:  XALATAN Place 1 drop into the left eye at bedtime.    lisinopril 30 MG tablet Commonly known as:  ZESTRIL Take 1 tablet (30 mg total) by mouth daily.   MELATONIN PO Take by mouth.   metoprolol succinate 25 MG 24 hr tablet Commonly known as:  TOPROL-XL Take 1 tablet (25 mg total) by mouth daily.   pravastatin 10 MG tablet Commonly known as:  PRAVACHOL Take 1 tablet (10 mg total) by mouth daily.   Vitamin D-1000 Max St 25 MCG (1000 UT) tablet Generic drug:  Cholecalciferol Take 2 tablets by mouth daily.      Health maintenance:  Colonoscopy: N/A Mammogram: pt declines Cervical cancer screening(<65):N/A Immunizations: tdap. PNA, shingrix due>>> pt declines.  Infectious disease screening: N/A DEXA: Pt declines Glaucoma screen: 2019- has an appt with Dr. Jodi Mourning Hearing: Pt is hard of hearing. She has hearing aids- but still has difficulty>> she declines referral.  Cognitive assessment:  Mild cognitive impairment. Patient and Son feels she is doing ok. She lives alone, but he checks on her daily and makes certain she has her medications etc. Referral declined.  Depression screen [facility] 2/9 08/03/2018 08/03/2018 05/25/2018 11/24/2017 06/16/2017  Decreased Interest 0 0 0 0 0  Down, Depressed, Hopeless 0 0 0 0 0  PHQ - 2 Score 0 0 0 0 0   No flowsheet data found.  Current Exercise Habits: The patient does not participate in regular exercise at present   [facility] Score Fatigue: 0 Resistance: 0 Ambulation : 1 Incontinence : 0 Loss of Weight: 0 Nutritional Approach : 0 Help with Dressing: 0  Total: 1 (08/03/2018  2:14 PM)   Diet: Regular  Functional Status Survey: Is the patient deaf or have difficulty hearing?: Yes Does the patient have difficulty seeing, even when wearing glasses/contacts?: No Does the patient have difficulty concentrating, remembering, or making decisions?: No Does the patient have difficulty walking or climbing stairs?: Yes Does the patient have difficulty dressing or bathing?: No Does the patient have  difficulty doing errands alone such as visiting a doctor's office or shopping?: Yes  Get up and go test: steady and less than 20 seconds.    Fall Risk  08/03/2018 05/25/2018 11/24/2017 06/16/2017 09/01/2016  Falls in the past year? 0 0 No No No  Risk for fall due to : - - - - Impaired balance/gait;Impaired mobility  Follow up - Falls evaluation completed;Education provided - - -   Advanced Care Planning: N Packet provided to patient and her son today. Explained HPOA and AD. They will complete and bring in a copy.   Cardiovascular Screening Blood Tests Lipid Panel     Component Value Date/Time   CHOL 248 (H) 05/25/2018 0953   TRIG (H) 05/25/2018 5784  548.0 Triglyceride is over 400; calculations on Lipids are invalid.   HDL 35.40 (L) 05/25/2018 0953   CHOLHDL 7 05/25/2018 0953   VLDL NOT CALC 09/14/2016 0800   LDLCALC NOT CALC 09/14/2016 0800   LDLDIRECT 107.0 05/25/2018 0953   Diabetes Screening Tests Lab Results  Component Value Date   HGBA1C 6.6 (H) 05/25/2018     Assessment and Plan: Medicare annual wellness visit, subsequent Patient was encouraged to exercise greater than 150 minutes a week. Patient was encouraged to choose a diet filled with fresh fruits and vegetables, and lean meats. AVS provided to patient today for education/recommendation on gender specific health and safety maintenance. Patient has support system via her son. However, she does live alone.  Colonoscopy: N/A Mammogram: pt declines futher screening. Cervical cancer screening(<65):N/A Immunizations: tdap,  PNA and  shingrix due>>> pt declines all.  Infectious disease screening: N/A DEXA: Osteoporosis diagnosis. Pt declines further bone density. She does supplement her vit d.  Glaucoma screen: 2019- has an appt with Dr. Jodi Mourning Hearing: Pt is hard of hearing. She has hearing aids- but still has difficulty>> she declines referral.  Cognitive assessment:  Mild cognitive impairment. Patient and Son feels  she is doing ok. She lives alone, but he checks on her daily and makes certain she has her medications etc. Referral declined.  Advanced directives Packet provided to patient and her son today. Explained HPOA and AD. They will complete and bring in a copy.   Return 1 year for medicare wellness.   Electronically Signed by: Howard Pouch, DO [place]

## 2018-08-06 ENCOUNTER — Encounter: Payer: Self-pay | Admitting: Family Medicine

## 2018-08-06 DIAGNOSIS — G3184 Mild cognitive impairment, so stated: Secondary | ICD-10-CM | POA: Insufficient documentation

## 2018-08-06 NOTE — Patient Instructions (Signed)
Madison Martinez , It was a pleasure seeing you the other [place]. I hope you had a wonderful Birthday.  Thank you for taking time to come for your Medicare Wellness Visit. I appreciate your ongoing commitment to your health goals. Please review the following plan we discussed and let me know if I can assist you in the future.   These are the goals we discussed: Goals    . Patient Stated     Maintain current health.        You have declined your mammogram, bone density scan, referral to hearing specialist, referral for your memory, tetanus vaccine, Pneumonia vaccine and shingles vaccine .  If you change your mind at anytime, please feel free to contact us.  Please complete your advanced directives/Health care power of attorney papers and then provide [place] with a copy. You do not have use to use the packet we provided. You can obtain a lawyer and they can draft specific request as well. Just make sure it is notarized prior to returning to [place].    Health Maintenance After Age 45 After age 3, you are at a higher risk for certain long-term diseases and infections as well as injuries from falls. Falls are a major cause of broken bones and head injuries in people who are older than age 46. Getting regular preventive care can help to keep you healthy and well. Preventive care includes getting regular testing and making lifestyle changes as recommended by your health care provider. Talk with your health care provider about:  Which screenings and tests you should have. A screening is a test that checks for a disease when you have no symptoms.  A diet and exercise plan that is right for you. What should I know about screenings and tests to prevent falls? Screening and testing are the best ways to find a health problem early. Early diagnosis and treatment give you the best chance of managing medical conditions that are common after age 90. Certain conditions and lifestyle choices may make you more likely to have a  fall. Your health care provider may recommend:  Regular vision checks. Poor vision and conditions such as cataracts can make you more likely to have a fall. If you wear glasses, make sure to get your prescription updated if your vision changes.  Medicine review. Work with your health care provider to regularly review all of the medicines you are taking, including over-the-counter medicines. Ask your health care provider about any side effects that may make you more likely to have a fall. Tell your health care provider if any medicines that you take make you feel dizzy or sleepy.  Osteoporosis screening. Osteoporosis is a condition that causes the bones to get weaker. This can make the bones weak and cause them to break more easily.  Blood pressure screening. Blood pressure changes and medicines to control blood pressure can make you feel dizzy.  Strength and balance checks. Your health care provider may recommend certain tests to check your strength and balance while standing, walking, or changing positions.  Foot health exam. Foot pain and numbness, as well as not wearing proper footwear, can make you more likely to have a fall.  Depression screening. You may be more likely to have a fall if you have a fear of falling, feel emotionally low, or feel unable to do activities that you used to do.  Alcohol use screening. Using too much alcohol can affect your balance and may make you more likely  to have a fall. What actions can I take to lower my risk of falls? General instructions  Talk with your health care provider about your risks for falling. Tell your health care provider if: ? You fall. Be sure to tell your health care provider about all falls, even ones that seem minor. ? You feel dizzy, sleepy, or off-balance.  Take over-the-counter and prescription medicines only as told by your health care provider. These include any supplements.  Eat a healthy diet and maintain a healthy weight. A  healthy diet includes low-fat dairy products, low-fat (lean) meats, and fiber from whole grains, beans, and lots of fruits and vegetables. Home safety  Remove any tripping hazards, such as rugs, cords, and clutter.  Install safety equipment such as grab bars in bathrooms and safety rails on stairs.  Keep rooms and walkways well-lit. Activity   Follow a regular exercise program to stay fit. This will help you maintain your balance. Ask your health care provider what types of exercise are appropriate for you.  If you need a cane or walker, use it as recommended by your health care provider.  Wear supportive shoes that have nonskid soles. Lifestyle  Do not drink alcohol if your health care provider tells you not to drink.  If you drink alcohol, limit how much you have: ? 0-1 drink a [place] for women. ? 0-2 drinks a [place] for men.  Be aware of how much alcohol is in your drink. In the [place] one drink equals one typical bottle of beer (12 oz), one-half glass of wine (5 oz), or one shot of hard liquor (1 oz).  Do not use any products that contain nicotine or tobacco, such as cigarettes and e-cigarettes. If you need help quitting, ask your health care provider. Summary  Having a healthy lifestyle and getting preventive care can help to protect your health and wellness after age 75.  Screening and testing are the best way to find a health problem early and help you avoid having a fall. Early diagnosis and treatment give you the best chance for managing medical conditions that are more common for people who are older than age 43.  Falls are a major cause of broken bones and head injuries in people who are older than age 33. Take precautions to prevent a fall at home.  Work with your health care provider to learn what changes you can make to improve your health and wellness and to prevent falls. This information is not intended to replace advice given to you by your health care provider. Make  sure you discuss any questions you have with your health care provider. Document Released: 01/18/2017 Document Revised: 01/18/2017 Document Reviewed: 01/18/2017 Elsevier Interactive Patient Education  2019 [employer].

## 2018-08-17 ENCOUNTER — Other Ambulatory Visit: Payer: Self-pay | Admitting: Family Medicine

## 2018-08-17 DIAGNOSIS — I10 Essential (primary) hypertension: Secondary | ICD-10-CM

## 2018-08-17 NOTE — Telephone Encounter (Signed)
Copied from [place] [id number]. Topic: Quick Communication - Rx Refill/Question >> Aug 17, 2018  5:10 PM Mcneil, Ja-Kwan wrote: Medication: dorzolamide-timolol (COSOPT) 22.3-6.8 MG/ML ophthalmic solution, gabapentin (NEURONTIN) 300 MG capsule, glipiZIDE (GLUCOTROL XL) 2.5 MG 24 hr tablet, hydrALAZINE (APRESOLINE) 25 MG tablet, and lisinopril (PRINIVIL,ZESTRIL) 30 MG tablet   Has the patient contacted their pharmacy? yes   Preferred Pharmacy (with phone number or street name): [facility], [place] [phone] (Phone) [phone] (Fax)  Agent: Please be advised that RX refills may take up to 3 business days. We ask that you follow-up with your pharmacy.

## 2018-08-21 MED ORDER — HYDRALAZINE HCL 25 MG PO TABS: ORAL_TABLET | ORAL | 0 refills | Status: DC

## 2018-08-21 MED ORDER — GABAPENTIN 300 MG PO CAPS: 600.0000 mg | ORAL_CAPSULE | Freq: Two times a day (BID) | ORAL | 0 refills | Status: DC

## 2018-08-21 MED ORDER — LISINOPRIL 30 MG PO TABS: 30.0000 mg | ORAL_TABLET | Freq: Every day | ORAL | 0 refills | Status: DC

## 2018-08-21 MED ORDER — GLIPIZIDE ER 2.5 MG PO TB24: 2.5000 mg | ORAL_TABLET | Freq: Every day | ORAL | 0 refills | Status: DC

## 2018-08-21 NOTE — Telephone Encounter (Signed)
Refilled meds. Please make them aware

## 2018-08-21 NOTE — Telephone Encounter (Signed)
Pt was called and spoke with Son, Max, pts POA. He stated he would like everything switched to the mail order pharmacy to save on RX costs. Pt has a little over a week worth of medications at home to last until they arrive. Son was told that we do not RX the eye drops and he would need to contact her eye doctor to get that RX. He verbalized understanding.   RF request for Glipizide  Gabapentin  Hyrdralazine  Lisinopril  To [facility]  LOV: 08/03/2018 Next ov: 11/30/2018 Last written: All meds written 05/25/2018  Please advise if you would like for me to call in a [address] [place] supply and cancel all refills at local.   Thanks

## 2018-08-21 NOTE — Telephone Encounter (Signed)
Pts son was called and told RX has been filled at [facility]. Tried to contact [facility] to cancel RX/refills with no answer. [place] #. Will try again later.

## 2018-10-10 ENCOUNTER — Other Ambulatory Visit: Payer: Self-pay | Admitting: Family Medicine

## 2018-10-10 DIAGNOSIS — I10 Essential (primary) hypertension: Secondary | ICD-10-CM

## 2018-11-02 ENCOUNTER — Telehealth: Payer: Self-pay | Admitting: Family Medicine

## 2018-11-02 NOTE — Telephone Encounter (Signed)
Pts son was called and message was left letting son know that depending on whether they thought it was infected or not that we could schedule with another provider and to return call.

## 2018-11-02 NOTE — Telephone Encounter (Signed)
Patient is 85 years old. Is it okay to bring her in to the office for an office visit?

## 2018-11-02 NOTE — Telephone Encounter (Signed)
Patient has wound that is the size of a quarter on her chest. She has had it for 3 months. It is red but not sensitive. She squeezed it. Only blood came out, no puss. Her son describes it as a depression in her skin. He was not aware that it was there. He would like to make an appointment for the first opening 11/13/18. Does this need to be in office or virtual?

## 2018-11-02 NOTE — Telephone Encounter (Signed)
This sounds like it may need to be seen before I am back if they have concerns of infection. If they do not feel it is infected then it can be in office with me on my return.

## 2018-11-05 NOTE — Telephone Encounter (Signed)
Patient's son does not feel like it is infected. I have scheduled patient 12/14/18

## 2018-11-13 ENCOUNTER — Other Ambulatory Visit: Payer: Self-pay

## 2018-11-13 ENCOUNTER — Encounter: Payer: Self-pay | Admitting: Family Medicine

## 2018-11-13 ENCOUNTER — Ambulatory Visit (INDEPENDENT_AMBULATORY_CARE_PROVIDER_SITE_OTHER): Payer: Medicare Other | Admitting: Family Medicine

## 2018-11-13 VITALS — BP 118/68 | HR 72 | Temp 97.3°F | Resp 18 | Ht 64.0 in | Wt 143.4 lb

## 2018-11-13 DIAGNOSIS — L989 Disorder of the skin and subcutaneous tissue, unspecified: Secondary | ICD-10-CM | POA: Diagnosis not present

## 2018-11-13 NOTE — Progress Notes (Signed)
Madison Martinez , [birth year], 85 y.o., female MRN: [id number] Patient Care Team    Relationship Specialty Notifications Start End  Ma Hillock, DO PCP - General Family Medicine  09/01/16   Verdell Carmine, MD Referring Physician Internal Medicine  09/01/16   Renelda Loma, OD  Optometry  09/01/16   [facility]ates, [facility]  Ophthalmology  06/16/17   Manuela Neptune, DPM Referring Physician Podiatry  06/16/17     Chief Complaint  Patient presents with  . Skin Problem    Been there a couple of months. Has always been that size but when pt messes with it bleeds. No pain. Pt uses abx cream. Pt declined flu shot.     Subjective: Pt presents for an OV with complaints of skin lesion on her chest of 2 months duration.  Patient reports the lesion which is located midline chest was noticed about 2 months ago.  She states that it became large and she tried to "squeeze "it.  She reports no puslike drainage was expelled, however it did start bleeding.  Since that time she has been putting antibiotic ointment over this area and keeping it covered.  She presented today secondary to the area not healing appropriately and becoming irritated.  Patient reports no history of skin cancers or family history of skin cancer.  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  Pt has tried bacitracin ointment to ease their symptoms.   Depression screen [facility] 2/9 08/03/2018 08/03/2018 05/25/2018 11/24/2017 06/16/2017  Decreased Interest 0 0 0 0 0  Down, Depressed, Hopeless 0 0 0 0 0  PHQ - 2 Score 0 0 0 0 0    Allergies  Allergen Reactions  . Amlodipine Besylate     Other reaction(s): Vertigo  . Flu Virus Vaccine Swelling   Social History   Social History Narrative   Divorced. 3 children [name], Max and [name]. Lives alone.   High school graduate.   Takes a daily vitamin.   Wears her seatbelt. Wears a hearing aid. Wears dentures.   Requires a cane for assistive  device for walking.   Smoke detector in the home.   Feels safe in her relationships.   Past Medical History:  Diagnosis Date  . Blind hypertensive eye, left 04/18/2012  . Gait disturbance 10/05/2015   pt declined neuro referral per records  . Hard of hearing 12/02/2015   hearing aids  . Hyperlipidemia   . Hypertension   . Insomnia    "tried melatonin, advil PM, xanax and klonopin without success"  . Leukocytosis 11/07/2013   seen oncology, did not want to continue to follow there, so continued follow up at PCP.  . Osteoporosis   . Polyneuropathy   . Primary open angle glaucoma 01/2011  . Spinal stenosis   . Stable branch retinal vein occlusion 01/06/2012  . Type 2 diabetes mellitus ([place])    "diet controlled"   Past Surgical History:  Procedure Laterality Date  . EYE SURGERY    . [place]  2005  . TOTAL HIP ARTHROPLASTY Left 1991  . TOTAL HIP ARTHROPLASTY Right ?   x 2   Family History  Problem Relation Age of Onset  . Heart disease Brother    Allergies as of 11/13/2018      Reactions   Amlodipine Besylate    Other reaction(s): Vertigo   Flu Virus Vaccine Swelling      Medication List  Accurate as of November 13, 2018  6:58 PM. If you have any questions, ask your nurse or doctor.        aspirin 81 MG EC tablet Take 1 tablet by mouth daily.   dorzolamide-timolol 22.3-6.8 MG/ML ophthalmic solution Commonly known as: COSOPT Place 1 drop into the right eye 2 (two) times daily.   gabapentin 300 MG capsule Commonly known as: NEURONTIN Take 2 capsules (600 mg total) by mouth 2 (two) times daily.   glipiZIDE 2.5 MG 24 hr tablet Commonly known as: GLUCOTROL XL TAKE 1 TABLET BY MOUTH  DAILY WITH BREAKFAST   hydrALAZINE 25 MG tablet Commonly known as: APRESOLINE TAKE 1 TABLET BY MOUTH  EVERY 8 HOURS.   KRILL OIL PO Take by mouth.   latanoprost 0.005 % ophthalmic solution Commonly known as: XALATAN Place 1 drop into the left eye at bedtime.    lisinopril 30 MG tablet Commonly known as: ZESTRIL TAKE 1 TABLET BY MOUTH  DAILY   MELATONIN PO Take by mouth.   metoprolol succinate 25 MG 24 hr tablet Commonly known as: TOPROL-XL Take 1 tablet (25 mg total) by mouth daily.   pravastatin 10 MG tablet Commonly known as: PRAVACHOL Take 1 tablet (10 mg total) by mouth daily.   Vitamin D-1000 Max St 25 MCG (1000 UT) tablet Generic drug: Cholecalciferol Take 2 tablets by mouth daily.       All past medical history, surgical history, allergies, family history, immunizations andmedications were updated in the EMR today and reviewed under the history and medication portions of their EMR.     ROS: Negative, with the exception of above mentioned in HPI   Objective:  BP 118/68 (BP Location: Right Arm, Patient Position: Sitting, Cuff Size: Normal)   Pulse 72   Temp (!) 97.3 F (36.3 C) (Temporal)   Resp 18   Ht 5\' 4"  (1.626 m)   Wt 143 lb 6 oz (65 kg)   SpO2 97%   BMI 24.61 kg/m  Body mass index is 24.61 kg/m. Gen: Afebrile. No acute distress. Nontoxic in appearance, well developed, well nourished.  HENT: AT. [place]. Eyes:Pupils Equal Round Reactive to light, Extraocular movements intact,  Conjunctiva without redness, discharge or icterus. Neck/lymp/endocrine: Supple,no cervical or axillary  lymphadenopathy Skin: mid chest red ulcerated mildly fungated appearing  raised (~1 cm raised) by 1.3 cm x 1.2 cm skin lesion. Neuro: Normal gait. PERLA. EOMi. Alert. Oriented x3   No exam data present No results found. No results found for this or any previous visit (from the past 24 hour(s)).  Assessment/Plan: Madison Martinez is a 85 y.o. female present for OV for  Skin lesion of chest wall -rather significant skin lesion of her chest wall. Concern for  fungating squamous cell carcinoma vs skin manifestation of her monoclonal B-cell lymphocytosis. Urgent referral placed to Derm or plastics for evaluation.  - Ambulatory referral to  Dermatology     Reviewed expectations re: course of current medical issues.  Discussed self-management of symptoms.  Outlined signs and symptoms indicating need for more acute intervention.  Patient verbalized understanding and all questions were answered.  Patient received an After-Visit Summary.   > 15 minutes spent with patient, > 50% of that time face to face   Orders Placed This Encounter  Procedures  . Ambulatory referral to Dermatology     Note is dictated utilizing voice recognition software. Although note has been proof read prior to signing, occasional typographical errors still can be missed. If  any questions arise, please do not hesitate to call for verification.   electronically signed by:  Howard Pouch, DO  [place]

## 2018-11-13 NOTE — Patient Instructions (Signed)
They will call you to schedule an appt very soon with dermatology to have this removed and tested. I am concerned it could be a skin cancer.

## 2018-11-23 ENCOUNTER — Other Ambulatory Visit: Payer: Self-pay

## 2018-11-23 ENCOUNTER — Encounter: Payer: Self-pay | Admitting: Plastic Surgery

## 2018-11-23 ENCOUNTER — Ambulatory Visit: Payer: Medicare Other | Admitting: Plastic Surgery

## 2018-11-23 DIAGNOSIS — L989 Disorder of the skin and subcutaneous tissue, unspecified: Secondary | ICD-10-CM | POA: Diagnosis not present

## 2018-11-23 HISTORY — DX: Disorder of the skin and subcutaneous tissue, unspecified: L98.9

## 2018-11-23 NOTE — Progress Notes (Signed)
Patient ID: Madison Martinez, female    DOB: February 23, 1928, 85 y.o.   MRN: [id number]   Chief Complaint  Patient presents with  . Skin Problem    The patient is an 85 year old female here with family for evaluation of a changing skin lesion of her chest.  The patient states she first noticed it 2 months ago.  It has grown rapidly and has a tendency to bleed and scab.  She has multiple medical conditions as described below.   Nothing makes the lesion better and it is not going away.  It is 2 cm, ulcerated in the center with raised edges.  It is located in the middle lower chest area.  She is not on any anticoagulation. She has diabetes.   Review of Systems  Constitutional: Negative for activity change, appetite change and fatigue.  HENT: Negative for congestion.   Respiratory: Negative for chest tightness and shortness of breath.   Cardiovascular: Negative for leg swelling.  Gastrointestinal: Negative for abdominal pain.  Endocrine: Negative.   Genitourinary: Negative.   Musculoskeletal: Positive for arthralgias.  Skin: Positive for color change and wound.  Neurological: Negative.   Hematological: Negative.   Psychiatric/Behavioral: Negative.     Past Medical History:  Diagnosis Date  . Blind hypertensive eye, left 04/18/2012  . Gait disturbance 10/05/2015   pt declined neuro referral per records  . Hard of hearing 12/02/2015   hearing aids  . Hyperlipidemia   . Hypertension   . Insomnia    "tried melatonin, advil PM, xanax and klonopin without success"  . Leukocytosis 11/07/2013   seen oncology, did not want to continue to follow there, so continued follow up at PCP.  . Osteoporosis   . Polyneuropathy   . Primary open angle glaucoma 01/2011  . Spinal stenosis   . Stable branch retinal vein occlusion 01/06/2012  . Type 2 diabetes mellitus ([place])    "diet controlled"    Past Surgical History:  Procedure Laterality Date  . EYE SURGERY    . [place]  2005  . TOTAL HIP  ARTHROPLASTY Left 1991  . TOTAL HIP ARTHROPLASTY Right ?   x 2      Current Outpatient Medications:  .  aspirin 81 MG EC tablet, Take 1 tablet by mouth daily., Disp: , Rfl:  .  Cholecalciferol (VITAMIN D-1000 MAX ST) 1000 units tablet, Take 2 tablets by mouth daily., Disp: , Rfl:  .  dorzolamide-timolol (COSOPT) 22.3-6.8 MG/ML ophthalmic solution, Place 1 drop into the right eye 2 (two) times daily. , Disp: , Rfl:  .  gabapentin (NEURONTIN) 300 MG capsule, Take 2 capsules (600 mg total) by mouth 2 (two) times daily., Disp: 120 capsule, Rfl: 0 .  glipiZIDE (GLUCOTROL XL) 2.5 MG 24 hr tablet, TAKE 1 TABLET BY MOUTH  DAILY WITH BREAKFAST, Disp: 90 tablet, Rfl: 0 .  hydrALAZINE (APRESOLINE) 25 MG tablet, TAKE 1 TABLET BY MOUTH  EVERY 8 HOURS., Disp: 270 tablet, Rfl: 0 .  KRILL OIL PO, Take by mouth., Disp: , Rfl:  .  latanoprost (XALATAN) 0.005 % ophthalmic solution, Place 1 drop into the left eye at bedtime. , Disp: , Rfl:  .  lisinopril (ZESTRIL) 30 MG tablet, TAKE 1 TABLET BY MOUTH  DAILY, Disp: 90 tablet, Rfl: 0 .  MELATONIN PO, Take by mouth., Disp: , Rfl:  .  metoprolol succinate (TOPROL-XL) 25 MG 24 hr tablet, Take 1 tablet (25 mg total) by mouth daily., Disp: 90 tablet, Rfl:  1 .  pravastatin (PRAVACHOL) 10 MG tablet, Take 1 tablet (10 mg total) by mouth daily., Disp: 90 tablet, Rfl: 3   Objective:   Vitals:   11/23/18 1037  BP: (!) 163/78  Pulse: 83  Temp: 97.9 F (36.6 C)  SpO2: 95%    Physical Exam Vitals signs and nursing note reviewed.  Constitutional:      Appearance: Normal appearance.  HENT:     Head: Normocephalic.  Cardiovascular:     Rate and Rhythm: Normal rate.  Pulmonary:     Effort: Pulmonary effort is normal.  Chest:    Abdominal:     General: Abdomen is flat.  Neurological:     General: No focal deficit present.     Mental Status: She is alert.  Psychiatric:        Mood and Affect: Mood normal.     Assessment & Plan:  Changing skin lesion    Plan for excision of chest changing skin lesion.  This is concerning for skin cancer.  We will try to do this in the office. Pictures were obtained of the patient and placed in the chart with the patient's or guardian's permission.  [place], DO

## 2018-11-30 ENCOUNTER — Other Ambulatory Visit: Payer: Self-pay

## 2018-11-30 ENCOUNTER — Ambulatory Visit (INDEPENDENT_AMBULATORY_CARE_PROVIDER_SITE_OTHER): Payer: Medicare Other | Admitting: Family Medicine

## 2018-11-30 ENCOUNTER — Encounter: Payer: Self-pay | Admitting: Family Medicine

## 2018-11-30 VITALS — BP 116/72 | Ht 64.5 in

## 2018-11-30 DIAGNOSIS — N183 Chronic kidney disease, stage 3 unspecified: Secondary | ICD-10-CM

## 2018-11-30 DIAGNOSIS — I10 Essential (primary) hypertension: Secondary | ICD-10-CM

## 2018-11-30 DIAGNOSIS — E119 Type 2 diabetes mellitus without complications: Secondary | ICD-10-CM

## 2018-11-30 DIAGNOSIS — D72820 Lymphocytosis (symptomatic): Secondary | ICD-10-CM | POA: Diagnosis not present

## 2018-11-30 DIAGNOSIS — E785 Hyperlipidemia, unspecified: Secondary | ICD-10-CM | POA: Diagnosis not present

## 2018-11-30 MED ORDER — HYDRALAZINE HCL 25 MG PO TABS: ORAL_TABLET | ORAL | 1 refills | Status: DC

## 2018-11-30 MED ORDER — GABAPENTIN 300 MG PO CAPS: 600.0000 mg | ORAL_CAPSULE | Freq: Two times a day (BID) | ORAL | 1 refills | Status: DC

## 2018-11-30 MED ORDER — METOPROLOL SUCCINATE ER 25 MG PO TB24: 25.0000 mg | ORAL_TABLET | Freq: Every day | ORAL | 1 refills | Status: DC

## 2018-11-30 MED ORDER — PRAVASTATIN SODIUM 10 MG PO TABS: 10.0000 mg | ORAL_TABLET | Freq: Every day | ORAL | 3 refills | Status: DC

## 2018-11-30 MED ORDER — LISINOPRIL 30 MG PO TABS: 30.0000 mg | ORAL_TABLET | Freq: Every day | ORAL | 1 refills | Status: DC

## 2018-11-30 MED ORDER — GLIPIZIDE ER 2.5 MG PO TB24: 2.5000 mg | ORAL_TABLET | Freq: Every day | ORAL | 1 refills | Status: DC

## 2018-11-30 NOTE — Progress Notes (Signed)
[name]     VIRTUAL VISIT VIA VIDEO  I connected with Yeng M Quincy on 11/30/18 at 10:00 AM EDT by a video enabled telemedicine application and verified that I am speaking with the correct person using two identifiers. Location patient: Home Location provider: [facility], Office Persons participating in the virtual visit: Patient, Dr. Raoul Pitch and R.Baker, LPN  I discussed the limitations of evaluation and management by telemedicine and the availability of in person appointments. The patient expressed understanding and agreed to proceed.  Patient ID: Madison Martinez, female  DOB: [birth year], 85 y.o.   MRN: [id number] Patient Care Team    Relationship Specialty Notifications Start End  Ma Hillock, DO PCP - General Family Medicine  09/01/16   Verdell Carmine, MD Referring Physician Internal Medicine  09/01/16   Renelda Loma, OD  Optometry  09/01/16   [facility]  Ophthalmology  06/16/17   Manuela Neptune, DPM Referring Physician Podiatry  06/16/17     Chief Complaint  Patient presents with  . Hypertension    Pt is doing well with no complaints. Needs refills. Pt checks BP every morning. She states her BP has been low a lot. 112/70's.  . Diabetes    Subjective:  See M Calkin is a 85 y.o.  female present for follow up  Essential hypertension/hyperlipidemia/CKD3 Pt reports complaince with Metoprolol-XL 25 mg daily, hydralazine 25 mg TID  and lisinopril 30 mg daily. Patient denies chest pain, shortness of breath, dizziness or lower extremity edema.  She is taking statin and has added Krill oil.  BMP: 05/2018 Lipids: 05/2018- elevated tg CBC: 05/2018 Tsh: 05/2018 Diet: Monitors her diet, low-sodium Exercise: Does not exercise much, walks with a cane RF: Hypertension, hyperlipidemia, family history of heart disease  Type 2 diabetes mellitus without complication, without long-term current use of insulin (HCC) Pt reports compliance with glipizide 10.5 mg daily, tolerating  well. Denies numbness, tingling of extremities, hypo/hyperglycemic events or non-healing wounds. Pt reports BG ranges she does not check her blood sugars.  PNA series: She has declined Pneumovax Flu shot: She declines flu shot (recommneded yearly) Foot exam: We will complete at next visit Eye exam: Yearly eye exams encouraged A1c: 6.6 on 05/25/2018   Monoclonal B-cell lymphocytosis She is suppose to follow with her heme/onc every 3 months- but has not in some time. CBC has been stable here and she would like to continue following cbc here.    Depression screen [facility] 2/9 08/03/2018 08/03/2018 05/25/2018 11/24/2017 06/16/2017  Decreased Interest 0 0 0 0 0  Down, Depressed, Hopeless 0 0 0 0 0  PHQ - 2 Score 0 0 0 0 0   No flowsheet data found.     Fall Risk  08/03/2018 05/25/2018 11/24/2017 06/16/2017 09/01/2016  Falls in the past year? 0 0 No No No  Risk for fall due to : - - - - Impaired balance/gait;Impaired mobility  Follow up - Falls evaluation completed;Education provided - - -    There is no immunization history on file for this patient.  No exam data present  Past Medical History:  Diagnosis Date  . Blind hypertensive eye, left 04/18/2012  . Gait disturbance 10/05/2015   pt declined neuro referral per records  . Hard of hearing 12/02/2015   hearing aids  . Hyperlipidemia   . Hypertension   . Insomnia    "tried melatonin, advil PM, xanax and klonopin without success"  . Leukocytosis 11/07/2013   seen oncology, did  not want to continue to follow there, so continued follow up at PCP.  . Osteoporosis   . Polyneuropathy   . Primary open angle glaucoma 01/2011  . Spinal stenosis   . Stable branch retinal vein occlusion 01/06/2012  . Type 2 diabetes mellitus ([place])    "diet controlled"   Allergies  Allergen Reactions  . Amlodipine Besylate     Other reaction(s): Vertigo  . Flu Virus Vaccine Swelling   Past Surgical History:  Procedure Laterality Date  . EYE SURGERY    . [place]  2005  . TOTAL HIP ARTHROPLASTY Left 1991  . TOTAL HIP ARTHROPLASTY Right ?   x 2   Family History  Problem Relation Age of Onset  . Heart disease Brother    Social History   Socioeconomic History  . Marital status: Divorced    Spouse name: Not on file  . Number of children: 3  . Years of education: 21  . Highest education level: Not on file  Occupational History  . Not on file  Social Needs  . Financial resource strain: Not on file  . Food insecurity    Worry: Not on file    Inability: Not on file  . Transportation needs    Medical: Not on file    Non-medical: Not on file  Tobacco Use  . Smoking status: Never Smoker  . Smokeless tobacco: Never Used  Substance and Sexual Activity  . Alcohol use: No  . Drug use: No  . Sexual activity: Never  Lifestyle  . Physical activity    Days per week: Not on file    Minutes per session: Not on file  . Stress: Not on file  Relationships  . Social Herbalist on phone: Not on file    Gets together: Not on file    Attends religious service: Not on file    Active member of club or organization: Not on file    Attends meetings of clubs or organizations: Not on file    Relationship status: Not on file  . Intimate partner violence    Fear of current or ex partner: Not on file    Emotionally abused: Not on file    Physically abused: Not on file    Forced sexual activity: Not on file  Other Topics Concern  . Not on file  Social History Narrative   Divorced. 3 children [name], Max and [name]. Lives alone.   High school graduate.   Takes a daily vitamin.   Wears her seatbelt. Wears a hearing aid. Wears dentures.   Requires a cane for assistive device for walking.   Smoke detector in the home.   Feels safe in her relationships.   Allergies as of 11/30/2018      Reactions   Amlodipine Besylate    Other reaction(s): Vertigo   Flu Virus Vaccine Swelling      Medication List       Accurate as of November 30, 2018 10:49 AM. If you have any questions, ask your nurse or doctor.        aspirin 81 MG EC tablet Take 1 tablet by mouth daily.   dorzolamide-timolol 22.3-6.8 MG/ML ophthalmic solution Commonly known as: COSOPT Place 1 drop into the right eye 2 (two) times daily.   gabapentin 300 MG capsule Commonly known as: NEURONTIN Take 2 capsules (600 mg total) by mouth 2 (two) times daily.   glipiZIDE 2.5 MG 24 hr tablet Commonly  known as: GLUCOTROL XL Take 1 tablet (2.5 mg total) by mouth daily with breakfast.   hydrALAZINE 25 MG tablet Commonly known as: APRESOLINE TAKE 1 TABLET BY MOUTH  EVERY 8 HOURS.   KRILL OIL PO Take by mouth.   latanoprost 0.005 % ophthalmic solution Commonly known as: XALATAN Place 1 drop into the left eye at bedtime.   lisinopril 30 MG tablet Commonly known as: ZESTRIL Take 1 tablet (30 mg total) by mouth daily.   MELATONIN PO Take by mouth.   metoprolol succinate 25 MG 24 hr tablet Commonly known as: TOPROL-XL Take 1 tablet (25 mg total) by mouth daily.   pravastatin 10 MG tablet Commonly known as: PRAVACHOL Take 1 tablet (10 mg total) by mouth daily.   Vitamin D-1000 Max St 25 MCG (1000 UT) tablet Generic drug: Cholecalciferol Take 2 tablets by mouth daily.       All past medical history, surgical history, allergies, family history, immunizations andmedications were updated in the EMR today and reviewed under the history and medication portions of their EMR.    No results found for this or any previous visit (from the past 2160 hour(s)).  No results found.   ROS: 14 pt review of systems performed and negative (unless mentioned in an HPI)  Objective: BP 116/72   Ht 5' 4.5" (1.638 m)   BMI 24.00 kg/m   Gen: Afebrile. No acute distress. HOH HENT: AT. [place].   CV: no edema Chest: no cough or shortness of breath Neuro:  Normal gait.Alert. Oriented x3  Psych: Normal affect, dress and demeanor. Normal speech. Normal thought content  and judgment.    Assessment/plan: Fernanda Twaddell Rafter is a 85 y.o. female present follow up  Essential hypertension/hyperlipidemia/CKD3 - Stable. Good control, especially for her.  - Continue Toprol XL 25 mg daily, lisinopril 30 mg daily and Hydralazine 25 mg TID.  Refills provided - Continue pravastatin . Refills provided. Triglycerides rather elevated last visit.  - cbc, cmp, lipids by lab appt.  - f/ u 6 mos  Type 2 diabetes mellitus without complication, without long-term current use of insulin (HCC) - tolerating start of glipazide 2.5 mg Qd. Refills provided today.  - HgB A1c by lab appt.  - exercise routinely, low sugar diet.  PNA series: She has declined Pneumovax Flu shot: She declines flu shot (recommneded yearly) Foot exam: We will complete at next visit Eye exam: Yearly eye exams encouraged A1c: 6.6 on 05/25/2018 - follow q 6 mos.   Monoclonal B-cell lymphocytosis - has heme/onc--> she does not follow as suggested. She has been encouraged to follow up. - CBC by lab appt - f/u q 6 mos   Return in about 6 months (around 05/30/2019).   Orders Placed This Encounter  Procedures  . CBC w/Diff    Standing Status:   Future    Standing Expiration Date:   11/30/2019  . Comp Met (CMET)    Standing Status:   Future    Standing Expiration Date:   11/30/2019    Order Specific Question:   Has the patient fasted?    Answer:   Yes  . Hemoglobin A1c    Standing Status:   Future    Standing Expiration Date:   11/30/2019  . Lipid panel    Standing Status:   Future    Standing Expiration Date:   11/30/2019    Order Specific Question:   Has the patient fasted?    Answer:   Yes   >  25 minutes spent with patient, >50% of time spent face to face    Electronically signed by: Howard Pouch, [place]

## 2018-12-07 ENCOUNTER — Other Ambulatory Visit: Payer: Self-pay

## 2018-12-07 ENCOUNTER — Ambulatory Visit (INDEPENDENT_AMBULATORY_CARE_PROVIDER_SITE_OTHER): Payer: Medicare Other | Admitting: Family Medicine

## 2018-12-07 DIAGNOSIS — I10 Essential (primary) hypertension: Secondary | ICD-10-CM

## 2018-12-07 DIAGNOSIS — N183 Chronic kidney disease, stage 3 unspecified: Secondary | ICD-10-CM

## 2018-12-07 DIAGNOSIS — E119 Type 2 diabetes mellitus without complications: Secondary | ICD-10-CM

## 2018-12-07 DIAGNOSIS — D72820 Lymphocytosis (symptomatic): Secondary | ICD-10-CM

## 2018-12-07 LAB — CBC WITH DIFFERENTIAL/PLATELET
Basophils Absolute: 0.1 10*3/uL (ref 0.0–0.1)
Basophils Relative: 0.6 % (ref 0.0–3.0)
Eosinophils Absolute: 0.3 10*3/uL (ref 0.0–0.7)
Eosinophils Relative: 3 % (ref 0.0–5.0)
HCT: 38.9 % (ref 36.0–46.0)
Hemoglobin: 12.8 g/dL (ref 12.0–15.0)
Lymphocytes Relative: 47.3 % — ABNORMAL HIGH (ref 12.0–46.0)
Lymphs Abs: 4.2 10*3/uL — ABNORMAL HIGH (ref 0.7–4.0)
MCHC: 32.8 g/dL (ref 30.0–36.0)
MCV: 94.5 fl (ref 78.0–100.0)
Monocytes Absolute: 0.5 10*3/uL (ref 0.1–1.0)
Monocytes Relative: 6.1 % (ref 3.0–12.0)
Neutro Abs: 3.8 10*3/uL (ref 1.4–7.7)
Neutrophils Relative %: 43 % (ref 43.0–77.0)
Platelets: 167 10*3/uL (ref 150.0–400.0)
RBC: 4.11 Mil/uL (ref 3.87–5.11)
RDW: 13.8 % (ref 11.5–15.5)
WBC: 8.9 10*3/uL (ref 4.0–10.5)

## 2018-12-07 LAB — COMPREHENSIVE METABOLIC PANEL
ALT: 14 U/L (ref 0–35)
AST: 22 U/L (ref 0–37)
Albumin: 4.2 g/dL (ref 3.5–5.2)
Alkaline Phosphatase: 88 U/L (ref 39–117)
BUN: 19 mg/dL (ref 6–23)
CO2: 27 mEq/L (ref 19–32)
Calcium: 10.2 mg/dL (ref 8.4–10.5)
Chloride: 105 mEq/L (ref 96–112)
Creatinine, Ser: 1.13 mg/dL (ref 0.40–1.20)
GFR: 45.3 mL/min — ABNORMAL LOW (ref >60.00)
Glucose, Bld: 108 mg/dL — ABNORMAL HIGH (ref 70–99)
Potassium: 4.4 mEq/L (ref 3.5–5.1)
Sodium: 141 mEq/L (ref 135–145)
Total Bilirubin: 0.4 mg/dL (ref 0.2–1.2)
Total Protein: 7 g/dL (ref 6.0–8.3)

## 2018-12-07 LAB — LIPID PANEL
Cholesterol: 240 mg/dL — ABNORMAL HIGH (ref 0–200)
HDL: 39 mg/dL — ABNORMAL LOW (ref >39.00)
NonHDL: 201.25
Total CHOL/HDL Ratio: 6
Triglycerides: 324 mg/dL — ABNORMAL HIGH (ref 0.0–149.0)
VLDL: 64.8 mg/dL — ABNORMAL HIGH (ref 0.0–40.0)

## 2018-12-07 LAB — HEMOGLOBIN A1C: Hgb A1c MFr Bld: 5.9 % (ref 4.6–6.5)

## 2018-12-07 LAB — LDL CHOLESTEROL, DIRECT: Direct LDL: 127 mg/dL

## 2018-12-10 ENCOUNTER — Telehealth: Payer: Self-pay | Admitting: Family Medicine

## 2018-12-10 NOTE — Telephone Encounter (Signed)
Please inform patient the following information: Her labs are all stable. Her sugar is better controlled this time as well.

## 2018-12-10 NOTE — Telephone Encounter (Signed)
Pts son Max was given results, he verbalized understanding

## 2018-12-13 ENCOUNTER — Telehealth: Payer: Self-pay

## 2018-12-13 NOTE — Telephone Encounter (Signed)
Called patient to confirm appointment scheduled for tomorrow. Patient's son answered the following questions: 1. To the best of your knowledge, have you been in close contact with any one with a confirmed diagnosis of COVID-19? No 2. Have you had any one or more of the following; fever, chills, cough, shortness of breath, or any flu-like symptoms? No 3. Have you been diagnosed with or have a previous diagnosis of COVID 19? No 4. I am going to go over a few other symptoms with you. Please let me know if you are experiencing any of the following: None of the below a. Ear, nose, or throat discomfort b. A sore throat c. Headache d. Muscle pain e. Diarrhea f. Loss of taste or smell

## 2018-12-14 ENCOUNTER — Encounter: Payer: Self-pay | Admitting: Plastic Surgery

## 2018-12-14 ENCOUNTER — Other Ambulatory Visit (HOSPITAL_COMMUNITY)
Admission: RE | Admit: 2018-12-14 | Discharge: 2018-12-14 | Disposition: A | Discharge status: Home / Self Care | Payer: Medicare Other | Source: Ambulatory Visit | Attending: Plastic Surgery | Admitting: Plastic Surgery

## 2018-12-14 ENCOUNTER — Ambulatory Visit: Payer: Medicare Other | Admitting: Plastic Surgery

## 2018-12-14 ENCOUNTER — Other Ambulatory Visit: Payer: Self-pay

## 2018-12-14 VITALS — BP 173/72 | HR 73 | Temp 96.9°F | Resp 16 | Ht 64.0 in | Wt 140.0 lb

## 2018-12-14 DIAGNOSIS — L989 Disorder of the skin and subcutaneous tissue, unspecified: Secondary | ICD-10-CM

## 2018-12-14 DIAGNOSIS — C4490 Unspecified malignant neoplasm of skin, unspecified: Secondary | ICD-10-CM | POA: Insufficient documentation

## 2018-12-14 NOTE — Progress Notes (Signed)
Preoperative Dx: Changing skin lesion chest  Postoperative Dx: Same  Procedure: Excision of 2.5 cm changing skin lesion chest  Anesthesia: Lidocaine 1% with 1:100,000 epinepherine  Indication for Procedure: Skin lesion  Description of Procedure: Risks and complications were explained to the patient.  Consent was confirmed.  Time out was called and all information was confirmed to be correct.  The area was prepped with betadine and drapped.  Lidocaine 1% with epinepherine was injected in the subcutaneous area.  After waiting several minutes for the lidocaine to take affect a #15 blade was used to excise the area in an eliptical pattern.  A 5-0 Monocryl was used to close the deep layers with simple interrupted stitches.  The skin edges were reapproximated with 5-0 Monocryl subcuticular running closure.  Steri strips were applied.  The patient is to follow up in one week.  She tolerated the procedure well and there were no complications. The specimen was marked long stitch left and short superior then sent to pathology.

## 2018-12-19 ENCOUNTER — Other Ambulatory Visit: Payer: Self-pay | Admitting: Surgical

## 2018-12-19 DIAGNOSIS — C4490 Unspecified malignant neoplasm of skin, unspecified: Secondary | ICD-10-CM

## 2018-12-19 DIAGNOSIS — C44529 Squamous cell carcinoma of skin of other part of trunk: Secondary | ICD-10-CM | POA: Diagnosis not present

## 2018-12-19 DIAGNOSIS — L989 Disorder of the skin and subcutaneous tissue, unspecified: Secondary | ICD-10-CM

## 2018-12-19 NOTE — Progress Notes (Signed)
Edit order.

## 2018-12-20 LAB — SURGICAL PATHOLOGY

## 2018-12-24 ENCOUNTER — Encounter: Payer: Self-pay | Admitting: Nurse Practitioner

## 2018-12-24 ENCOUNTER — Ambulatory Visit: Payer: Medicare Other | Admitting: Nurse Practitioner

## 2018-12-24 ENCOUNTER — Other Ambulatory Visit: Payer: Self-pay

## 2018-12-24 VITALS — BP 168/72 | HR 75 | Temp 97.5°F | Ht 64.5 in | Wt 143.8 lb

## 2018-12-24 DIAGNOSIS — L989 Disorder of the skin and subcutaneous tissue, unspecified: Secondary | ICD-10-CM

## 2018-12-24 DIAGNOSIS — C44529 Squamous cell carcinoma of skin of other part of trunk: Secondary | ICD-10-CM

## 2018-12-24 NOTE — Progress Notes (Signed)
Patient ID: Madison Martinez, female    DOB: 12-19-1927, 85 y.o.   MRN: [id number]   C.C.: post-op follow up  Madison Martinez is an 85 yo female who presents with her son for follow up after excision of changing skin lesion on her chest on 12/14/18. The incision was closed with 5-0 Monocryl sutures and covered with steri-strips. Pathology resulted as "well-differentiated squamous cell carcinoma, spanning 1.4 cm, resection margins are negative, no lymphovascular or perineural invasion." Patient denies any pain or discomfort at the incision. Patient requests to call her son with any further instructions.    Review of Systems  Constitutional: Negative.   HENT: Negative.   Respiratory: Negative.   Cardiovascular: Negative.   Gastrointestinal: Negative.   Genitourinary: Negative.   Musculoskeletal: Negative.   Skin: Positive for wound.  Neurological: Negative.     Past Medical History:  Diagnosis Date  . Blind hypertensive eye, left 04/18/2012  . Gait disturbance 10/05/2015   pt declined neuro referral per records  . Hard of hearing 12/02/2015   hearing aids  . Hyperlipidemia   . Hypertension   . Insomnia    "tried melatonin, advil PM, xanax and klonopin without success"  . Leukocytosis 11/07/2013   seen oncology, did not want to continue to follow there, so continued follow up at PCP.  . Osteoporosis   . Polyneuropathy   . Primary open angle glaucoma 01/2011  . Spinal stenosis   . Stable branch retinal vein occlusion 01/06/2012  . Type 2 diabetes mellitus ([place])    "diet controlled"    Past Surgical History:  Procedure Laterality Date  . EYE SURGERY    . [place]  2005  . TOTAL HIP ARTHROPLASTY Left 1991  . TOTAL HIP ARTHROPLASTY Right ?   x 2      Current Outpatient Medications:  .  aspirin 81 MG EC tablet, Take 1 tablet by mouth daily., Disp: , Rfl:  .  Cholecalciferol (VITAMIN D-1000 MAX ST) 1000 units tablet, Take 2 tablets by mouth daily., Disp: , Rfl:  .   dorzolamide-timolol (COSOPT) 22.3-6.8 MG/ML ophthalmic solution, Place 1 drop into the right eye 2 (two) times daily. , Disp: , Rfl:  .  gabapentin (NEURONTIN) 300 MG capsule, Take 2 capsules (600 mg total) by mouth 2 (two) times daily., Disp: 120 capsule, Rfl: 1 .  glipiZIDE (GLUCOTROL XL) 2.5 MG 24 hr tablet, Take 1 tablet (2.5 mg total) by mouth daily with breakfast., Disp: 90 tablet, Rfl: 1 .  hydrALAZINE (APRESOLINE) 25 MG tablet, TAKE 1 TABLET BY MOUTH  EVERY 8 HOURS., Disp: 270 tablet, Rfl: 1 .  KRILL OIL PO, Take by mouth., Disp: , Rfl:  .  latanoprost (XALATAN) 0.005 % ophthalmic solution, Place 1 drop into the left eye at bedtime. , Disp: , Rfl:  .  lisinopril (ZESTRIL) 30 MG tablet, Take 1 tablet (30 mg total) by mouth daily., Disp: 90 tablet, Rfl: 1 .  MELATONIN PO, Take by mouth., Disp: , Rfl:  .  metoprolol succinate (TOPROL-XL) 25 MG 24 hr tablet, Take 1 tablet (25 mg total) by mouth daily., Disp: 90 tablet, Rfl: 1 .  pravastatin (PRAVACHOL) 10 MG tablet, Take 1 tablet (10 mg total) by mouth daily., Disp: 90 tablet, Rfl: 3   Objective:   Vitals:   12/24/18 0910 12/24/18 0932  BP: (!) 213/62 (!) 168/72  Pulse: 75   Temp: (!) 97.5 F (36.4 C)   SpO2: 94%  Physical Exam  General: alert, calm, no acute distress HEENT: normocephalic Chest: symmetrical rise and fall, ~3cm clean, dry, and intact midline incision, sutures intact and steri-strips intact, small amount dried blood on steri-strips Lungs: unlabored breathing Cardiac: +2 bilateral radial pulse Musculoskeletal: MAEx4 Neuro: A&O x3, calm, cooperative, slightly unsteady gait Skin: warm, dry, thin   Assessment & Plan:  Changing skin lesion  Squamous cell carcinoma of skin of chest  Madison Martinez is an 85 yo female POD #10 s/p excision of changing skin lesion on chest. Pathology positive for squamous cell carcinoma, with negative borders. Patient's skin is very fragile. The sutures were left in place to ensure skin  approximation. Patient preferred to allow sutures to dissolve with time rather than remove in the office. I informed patient and son I would discuss the results with Dr. Marla Roe and call back with further instructions for follow up.   After discussion with Dr. Marla Roe, patient requires further excision to ensure margins are clear. Patient should also follow up with a dermatologist. If patient declines further excision, she can monitor the site for any skin changes and f/u with Dr. Marla Roe in 6 months. Left voicemail with patient's son on 12/25/18 and requested a return call.  I spoke with patient's son, Madison Martinez [facility]) over the phone (10/7) to review the above options. Mr. Madison Martinez will discuss with his mother, but requests to schedule the excision. Patient does not currently have a dermatologist. Mr. Madison Martinez will contact Madison Martinez's PCP to request a dermatology referral in the meantime.     Picture placed in chart with patient's consent.  Alfredo Batty, NP

## 2018-12-25 ENCOUNTER — Telehealth: Payer: Self-pay | Admitting: Nurse Practitioner

## 2018-12-25 NOTE — Telephone Encounter (Signed)
Left voicemail requesting a call back to discuss follow up options for [name]'s excised squamous cell carcinoma.

## 2018-12-26 ENCOUNTER — Telehealth: Payer: Self-pay

## 2018-12-26 ENCOUNTER — Telehealth (INDEPENDENT_AMBULATORY_CARE_PROVIDER_SITE_OTHER): Payer: Self-pay | Admitting: Nurse Practitioner

## 2018-12-26 ENCOUNTER — Telehealth: Payer: Self-pay | Admitting: Family Medicine

## 2018-12-26 DIAGNOSIS — C44529 Squamous cell carcinoma of skin of other part of trunk: Secondary | ICD-10-CM

## 2018-12-26 NOTE — Telephone Encounter (Signed)
Call to pt per Dr. Eusebio Friendly request to inform pt that the pathology report indicated that the chest lesion was squamous cell- but all the cancer was removed & no further care is necessary I spoke to pt & her son- who have called her PCP for referral to Dermatology to perform routine skin checks for future lesions of concerns. Pt reports that the incision is healing well- no complications & I reminded her & her son to call for any questions or concerns. Pt agrees with plan of care [facility]

## 2018-12-26 NOTE — Telephone Encounter (Signed)
I spoke with Mr. Madison Martinez (patient's son) to discuss Dr. Eusebio Friendly recommendations for Madison Martinez. Mr. Madison Martinez states he will discuss the options with his mother, but expects "she will want whatever the doctor recommends." Mr. Madison Martinez requests to go ahead and schedule the excision. I informed mr. Madison Martinez he would receive a phone call from the office to schedule the procedure date. Mr. Madison Martinez states he will call Madison Martinez's PCP to request a referral to a dermatologist in the meantime.

## 2018-12-26 NOTE — Telephone Encounter (Signed)
Dermatology referral placed per request of plastic surgeon 2/2 to squamous cell carcinoma diagnosis/removal of her chest. They will call her to schedule further follow ups.  She is to also follow up on current excision with her plastic surgeon.

## 2018-12-26 NOTE — Telephone Encounter (Signed)
Pts son was called and given information. He verbalized understanding

## 2018-12-26 NOTE — Telephone Encounter (Signed)
Called and spoke with patient's son and power of attorney, Max, to inform him that Dr. Marla Roe would like to re-excise the previous excision area. Max stated that after speaking with the patient that she does not want further excision at this time. Max stated that he would call back if the patient changes her mind.

## 2019-01-04 ENCOUNTER — Telehealth: Payer: Self-pay | Admitting: Family Medicine

## 2019-01-04 NOTE — Telephone Encounter (Signed)
Plastic surgery requested we refer. I believe december should be fine - she is following with plastics for current issue.

## 2019-01-04 NOTE — Telephone Encounter (Signed)
Ann from [facility] calling regarding referral for a new patient visit for squamous cell. Lelon Frohlich stated new patient appts first available is in December. Wants to make sure this is not too far out for appt.  Please call [phone]

## 2019-01-04 NOTE — Telephone Encounter (Signed)
Pt has been to plastic surgery and had this removed. Please advise if skin check/appt can wait until December

## 2019-01-07 NOTE — Telephone Encounter (Signed)
Dana/Diana this appt will be fine

## 2019-01-07 NOTE — Telephone Encounter (Signed)
Called practice to advise okay to schedule December

## 2019-01-08 ENCOUNTER — Telehealth: Payer: Self-pay | Admitting: Family Medicine

## 2019-01-08 ENCOUNTER — Other Ambulatory Visit: Payer: Self-pay

## 2019-01-08 DIAGNOSIS — E785 Hyperlipidemia, unspecified: Secondary | ICD-10-CM

## 2019-01-08 DIAGNOSIS — I10 Essential (primary) hypertension: Secondary | ICD-10-CM

## 2019-01-08 MED ORDER — METOPROLOL SUCCINATE ER 25 MG PO TB24: 25.0000 mg | ORAL_TABLET | Freq: Every day | ORAL | 0 refills | Status: DC

## 2019-01-08 MED ORDER — PRAVASTATIN SODIUM 10 MG PO TABS: 10.0000 mg | ORAL_TABLET | Freq: Every day | ORAL | 0 refills | Status: DC

## 2019-01-08 NOTE — Telephone Encounter (Signed)
Patient's son called. He has ordered metoprolol succinate (TOPROL-XL) 25 MG 24 hr and pravastatin (PRAVACHOL) 10 MG tablet from [facility] mail order. He was advised it would take 1 week to receive the medications. Can an Rx be sent for 1 week to [facility] in [place]. Patient is taking her last pill today.

## 2019-01-08 NOTE — Telephone Encounter (Signed)
RX for one week was sent to the pharmacy for patient until medications arrive from mail order pharmacy.

## 2019-01-10 ENCOUNTER — Other Ambulatory Visit: Payer: Self-pay | Admitting: Family Medicine

## 2019-01-10 DIAGNOSIS — I10 Essential (primary) hypertension: Secondary | ICD-10-CM

## 2019-01-14 DIAGNOSIS — Z85828 Personal history of other malignant neoplasm of skin: Secondary | ICD-10-CM | POA: Diagnosis not present

## 2019-02-21 DIAGNOSIS — H401111 Primary open-angle glaucoma, right eye, mild stage: Secondary | ICD-10-CM | POA: Diagnosis not present

## 2019-02-21 DIAGNOSIS — H4052X3 Glaucoma secondary to other eye disorders, left eye, severe stage: Secondary | ICD-10-CM | POA: Diagnosis not present

## 2019-03-21 ENCOUNTER — Other Ambulatory Visit: Payer: Self-pay | Admitting: Family Medicine

## 2019-03-21 DIAGNOSIS — I10 Essential (primary) hypertension: Secondary | ICD-10-CM

## 2019-03-30 ENCOUNTER — Other Ambulatory Visit: Payer: Self-pay | Admitting: Family Medicine

## 2019-03-30 DIAGNOSIS — I10 Essential (primary) hypertension: Secondary | ICD-10-CM

## 2019-04-25 DIAGNOSIS — H4052X3 Glaucoma secondary to other eye disorders, left eye, severe stage: Secondary | ICD-10-CM | POA: Diagnosis not present

## 2019-04-25 DIAGNOSIS — H401111 Primary open-angle glaucoma, right eye, mild stage: Secondary | ICD-10-CM | POA: Diagnosis not present

## 2019-05-14 ENCOUNTER — Inpatient Hospital Stay (HOSPITAL_COMMUNITY)
Admission: EM | Admit: 2019-05-14 | Discharge: 2019-05-18 | DRG: 064 | Disposition: A | Discharge status: Home Health | Payer: Medicare Other | Attending: Internal Medicine | Admitting: Internal Medicine

## 2019-05-14 ENCOUNTER — Other Ambulatory Visit: Payer: Self-pay

## 2019-05-14 ENCOUNTER — Emergency Department (HOSPITAL_COMMUNITY): Payer: Medicare Other

## 2019-05-14 ENCOUNTER — Encounter (HOSPITAL_COMMUNITY): Payer: Self-pay | Admitting: Emergency Medicine

## 2019-05-14 DIAGNOSIS — I63322 Cerebral infarction due to thrombosis of left anterior cerebral artery: Secondary | ICD-10-CM | POA: Diagnosis not present

## 2019-05-14 DIAGNOSIS — E871 Hypo-osmolality and hyponatremia: Secondary | ICD-10-CM

## 2019-05-14 DIAGNOSIS — H9190 Unspecified hearing loss, unspecified ear: Secondary | ICD-10-CM | POA: Diagnosis present

## 2019-05-14 DIAGNOSIS — G9340 Encephalopathy, unspecified: Secondary | ICD-10-CM

## 2019-05-14 DIAGNOSIS — I361 Nonrheumatic tricuspid (valve) insufficiency: Secondary | ICD-10-CM | POA: Diagnosis not present

## 2019-05-14 DIAGNOSIS — H5440 Blindness, one eye, unspecified eye: Secondary | ICD-10-CM | POA: Diagnosis present

## 2019-05-14 DIAGNOSIS — Z7984 Long term (current) use of oral hypoglycemic drugs: Secondary | ICD-10-CM | POA: Diagnosis not present

## 2019-05-14 DIAGNOSIS — I672 Cerebral atherosclerosis: Secondary | ICD-10-CM | POA: Diagnosis not present

## 2019-05-14 DIAGNOSIS — E1142 Type 2 diabetes mellitus with diabetic polyneuropathy: Secondary | ICD-10-CM | POA: Diagnosis present

## 2019-05-14 DIAGNOSIS — R0902 Hypoxemia: Secondary | ICD-10-CM | POA: Diagnosis not present

## 2019-05-14 DIAGNOSIS — I6340 Cerebral infarction due to embolism of unspecified cerebral artery: Principal | ICD-10-CM | POA: Diagnosis present

## 2019-05-14 DIAGNOSIS — I63512 Cerebral infarction due to unspecified occlusion or stenosis of left middle cerebral artery: Secondary | ICD-10-CM | POA: Diagnosis not present

## 2019-05-14 DIAGNOSIS — R4702 Dysphasia: Secondary | ICD-10-CM | POA: Diagnosis present

## 2019-05-14 DIAGNOSIS — Z8249 Family history of ischemic heart disease and other diseases of the circulatory system: Secondary | ICD-10-CM | POA: Diagnosis not present

## 2019-05-14 DIAGNOSIS — N179 Acute kidney failure, unspecified: Secondary | ICD-10-CM | POA: Diagnosis not present

## 2019-05-14 DIAGNOSIS — A09 Infectious gastroenteritis and colitis, unspecified: Secondary | ICD-10-CM | POA: Diagnosis not present

## 2019-05-14 DIAGNOSIS — I083 Combined rheumatic disorders of mitral, aortic and tricuspid valves: Secondary | ICD-10-CM | POA: Diagnosis not present

## 2019-05-14 DIAGNOSIS — Z20822 Contact with and (suspected) exposure to covid-19: Secondary | ICD-10-CM | POA: Diagnosis not present

## 2019-05-14 DIAGNOSIS — K5730 Diverticulosis of large intestine without perforation or abscess without bleeding: Secondary | ICD-10-CM | POA: Diagnosis present

## 2019-05-14 DIAGNOSIS — I340 Nonrheumatic mitral (valve) insufficiency: Secondary | ICD-10-CM | POA: Diagnosis not present

## 2019-05-14 DIAGNOSIS — I6503 Occlusion and stenosis of bilateral vertebral arteries: Secondary | ICD-10-CM | POA: Diagnosis not present

## 2019-05-14 DIAGNOSIS — K529 Noninfective gastroenteritis and colitis, unspecified: Secondary | ICD-10-CM | POA: Diagnosis not present

## 2019-05-14 DIAGNOSIS — E860 Dehydration: Secondary | ICD-10-CM | POA: Diagnosis present

## 2019-05-14 DIAGNOSIS — I129 Hypertensive chronic kidney disease with stage 1 through stage 4 chronic kidney disease, or unspecified chronic kidney disease: Secondary | ICD-10-CM | POA: Diagnosis present

## 2019-05-14 DIAGNOSIS — G459 Transient cerebral ischemic attack, unspecified: Secondary | ICD-10-CM | POA: Diagnosis not present

## 2019-05-14 DIAGNOSIS — I358 Other nonrheumatic aortic valve disorders: Secondary | ICD-10-CM | POA: Diagnosis not present

## 2019-05-14 DIAGNOSIS — T426X5A Adverse effect of other antiepileptic and sedative-hypnotic drugs, initial encounter: Secondary | ICD-10-CM | POA: Diagnosis not present

## 2019-05-14 DIAGNOSIS — Z96643 Presence of artificial hip joint, bilateral: Secondary | ICD-10-CM | POA: Diagnosis present

## 2019-05-14 DIAGNOSIS — R52 Pain, unspecified: Secondary | ICD-10-CM | POA: Diagnosis not present

## 2019-05-14 DIAGNOSIS — R4701 Aphasia: Secondary | ICD-10-CM | POA: Diagnosis not present

## 2019-05-14 DIAGNOSIS — I639 Cerebral infarction, unspecified: Secondary | ICD-10-CM | POA: Diagnosis not present

## 2019-05-14 DIAGNOSIS — E785 Hyperlipidemia, unspecified: Secondary | ICD-10-CM | POA: Diagnosis present

## 2019-05-14 DIAGNOSIS — I6523 Occlusion and stenosis of bilateral carotid arteries: Secondary | ICD-10-CM | POA: Diagnosis not present

## 2019-05-14 DIAGNOSIS — M2550 Pain in unspecified joint: Secondary | ICD-10-CM | POA: Diagnosis not present

## 2019-05-14 DIAGNOSIS — Z79899 Other long term (current) drug therapy: Secondary | ICD-10-CM | POA: Diagnosis not present

## 2019-05-14 DIAGNOSIS — G92 Toxic encephalopathy: Secondary | ICD-10-CM | POA: Diagnosis present

## 2019-05-14 DIAGNOSIS — I671 Cerebral aneurysm, nonruptured: Secondary | ICD-10-CM | POA: Diagnosis not present

## 2019-05-14 DIAGNOSIS — Z03818 Encounter for observation for suspected exposure to other biological agents ruled out: Secondary | ICD-10-CM | POA: Diagnosis not present

## 2019-05-14 DIAGNOSIS — I63232 Cerebral infarction due to unspecified occlusion or stenosis of left carotid arteries: Secondary | ICD-10-CM | POA: Diagnosis not present

## 2019-05-14 DIAGNOSIS — R4182 Altered mental status, unspecified: Secondary | ICD-10-CM | POA: Diagnosis not present

## 2019-05-14 DIAGNOSIS — I959 Hypotension, unspecified: Secondary | ICD-10-CM | POA: Diagnosis not present

## 2019-05-14 DIAGNOSIS — E1122 Type 2 diabetes mellitus with diabetic chronic kidney disease: Secondary | ICD-10-CM | POA: Diagnosis not present

## 2019-05-14 DIAGNOSIS — R531 Weakness: Secondary | ICD-10-CM

## 2019-05-14 DIAGNOSIS — J8410 Pulmonary fibrosis, unspecified: Secondary | ICD-10-CM | POA: Diagnosis not present

## 2019-05-14 DIAGNOSIS — Z7401 Bed confinement status: Secondary | ICD-10-CM | POA: Diagnosis not present

## 2019-05-14 DIAGNOSIS — Z743 Need for continuous supervision: Secondary | ICD-10-CM | POA: Diagnosis not present

## 2019-05-14 DIAGNOSIS — W19XXXA Unspecified fall, initial encounter: Secondary | ICD-10-CM | POA: Diagnosis not present

## 2019-05-14 DIAGNOSIS — K8020 Calculus of gallbladder without cholecystitis without obstruction: Secondary | ICD-10-CM | POA: Diagnosis not present

## 2019-05-14 DIAGNOSIS — I679 Cerebrovascular disease, unspecified: Secondary | ICD-10-CM | POA: Diagnosis not present

## 2019-05-14 DIAGNOSIS — N1831 Chronic kidney disease, stage 3a: Secondary | ICD-10-CM | POA: Diagnosis present

## 2019-05-14 DIAGNOSIS — Z7982 Long term (current) use of aspirin: Secondary | ICD-10-CM

## 2019-05-14 DIAGNOSIS — I10 Essential (primary) hypertension: Secondary | ICD-10-CM | POA: Diagnosis not present

## 2019-05-14 LAB — COMPREHENSIVE METABOLIC PANEL
ALT: 16 U/L (ref 0–44)
AST: 30 U/L (ref 15–41)
Albumin: 3.4 g/dL — ABNORMAL LOW (ref 3.5–5.0)
Alkaline Phosphatase: 68 U/L (ref 38–126)
Anion gap: 12 (ref 5–15)
BUN: 48 mg/dL — ABNORMAL HIGH (ref 8–23)
CO2: 22 mmol/L (ref 22–32)
Calcium: 9.2 mg/dL (ref 8.9–10.3)
Chloride: 96 mmol/L — ABNORMAL LOW (ref 98–111)
Creatinine, Ser: 2.96 mg/dL — ABNORMAL HIGH (ref 0.44–1.00)
GFR calc Af Amer: 16 mL/min — ABNORMAL LOW (ref >60)
GFR calc non Af Amer: 13 mL/min — ABNORMAL LOW (ref >60)
Glucose, Bld: 96 mg/dL (ref 70–99)
Potassium: 4.1 mmol/L (ref 3.5–5.1)
Sodium: 130 mmol/L — ABNORMAL LOW (ref 135–145)
Total Bilirubin: 0.6 mg/dL (ref 0.3–1.2)
Total Protein: 6.6 g/dL (ref 6.5–8.1)

## 2019-05-14 LAB — DIFFERENTIAL
Abs Immature Granulocytes: 0.22 10*3/uL — ABNORMAL HIGH (ref 0.00–0.07)
Basophils Absolute: 0.1 10*3/uL (ref 0.0–0.1)
Basophils Relative: 0 %
Eosinophils Absolute: 0.1 10*3/uL (ref 0.0–0.5)
Eosinophils Relative: 0 %
Immature Granulocytes: 1 %
Lymphocytes Relative: 28 %
Lymphs Abs: 6.5 10*3/uL — ABNORMAL HIGH (ref 0.7–4.0)
Monocytes Absolute: 1.4 10*3/uL — ABNORMAL HIGH (ref 0.1–1.0)
Monocytes Relative: 6 %
Neutro Abs: 14.8 10*3/uL — ABNORMAL HIGH (ref 1.7–7.7)
Neutrophils Relative %: 65 %

## 2019-05-14 LAB — PROTIME-INR
INR: 1.1 (ref 0.8–1.2)
Prothrombin Time: 14.3 seconds (ref 11.4–15.2)

## 2019-05-14 LAB — I-STAT CHEM 8, ED
BUN: 45 mg/dL — ABNORMAL HIGH (ref 8–23)
Calcium, Ion: 1.2 mmol/L (ref 1.15–1.40)
Chloride: 96 mmol/L — ABNORMAL LOW (ref 98–111)
Creatinine, Ser: 3.1 mg/dL — ABNORMAL HIGH (ref 0.44–1.00)
Glucose, Bld: 92 mg/dL (ref 70–99)
HCT: 36 % (ref 36.0–46.0)
Hemoglobin: 12.2 g/dL (ref 12.0–15.0)
Potassium: 4.1 mmol/L (ref 3.5–5.1)
Sodium: 130 mmol/L — ABNORMAL LOW (ref 135–145)
TCO2: 24 mmol/L (ref 22–32)

## 2019-05-14 LAB — CBG MONITORING, ED: Glucose-Capillary: 91 mg/dL (ref 70–99)

## 2019-05-14 LAB — CBC
HCT: 37.3 % (ref 36.0–46.0)
Hemoglobin: 12.4 g/dL (ref 12.0–15.0)
MCH: 31.6 pg (ref 26.0–34.0)
MCHC: 33.2 g/dL (ref 30.0–36.0)
MCV: 95.2 fL (ref 80.0–100.0)
Platelets: 198 10*3/uL (ref 150–400)
RBC: 3.92 MIL/uL (ref 3.87–5.11)
RDW: 13.2 % (ref 11.5–15.5)
WBC: 23.1 10*3/uL — ABNORMAL HIGH (ref 4.0–10.5)
nRBC: 0 % (ref 0.0–0.2)

## 2019-05-14 LAB — APTT: aPTT: 24 seconds (ref 24–36)

## 2019-05-14 LAB — POC SARS CORONAVIRUS 2 AG -  ED: SARS Coronavirus 2 Ag: NEGATIVE

## 2019-05-14 LAB — LACTIC ACID, PLASMA
Lactic Acid, Venous: 1.3 mmol/L (ref 0.5–1.9)
Lactic Acid, Venous: 1.3 mmol/L (ref 0.5–1.9)

## 2019-05-14 LAB — ETHANOL: Alcohol, Ethyl (B): <10 mg/dL (ref <10)

## 2019-05-14 MED ORDER — SODIUM CHLORIDE 0.9 % IV BOLUS
1000.0000 mL | Freq: Once | INTRAVENOUS | Status: AC
  Administered 2019-05-14: 23:00:00 1000 mL via INTRAVENOUS

## 2019-05-14 NOTE — ED Triage Notes (Signed)
CODE STROKE   Pt. BIB [facility] from home. Pt baseline A&Ox4, completely independent. Lives home alone, family visits daily.   [facility] Sunday 2/21 1900  Monday 2/22 pt was found on floor. Per EMS family noted pt. Was not acting like self, was a bit "sluggish," and found to have bump on head.   Tuesday 2/23 1530 pt was found confused. EMS called.

## 2019-05-14 NOTE — ED Provider Notes (Signed)
[facility] EMERGENCY DEPARTMENT Provider Note   CSN: [id number] Arrival date & time: 05/14/19  2008  An emergency department physician performed an initial assessment on this suspected stroke patient at 2014.  History Chief Complaint  Patient presents with  . Code Stroke    Madison Martinez is a 86 y.o. female who presents as a code stroke and AMS. The patient is a poor historian and is very hard of hearing limiting history. Apparently she has been constipated for some time and has been taking laxatives. This has resulted in her having diarrhea. She is unsure of how much. She reports associated nausea, vomiting, and abdominal pain. Normally she is independent and functional at home on her own. Her son usually checks on her and yesterday he found her on the ground. She was acting normal at that time just "sluggish". Today they checked on her again and her son states that the patient was "incoherent" and wasn't able to name simple objects. She was last seen completely normal 7PM on Sunday. Pt was made a code stroke due to aphasia.  LEVEL 5 CAVEAT due to AMS.    HPI     Past Medical History:  Diagnosis Date  . Blind hypertensive eye, left 04/18/2012  . Gait disturbance 10/05/2015   pt declined neuro referral per records  . Hard of hearing 12/02/2015   hearing aids  . Hyperlipidemia   . Hypertension   . Insomnia    "tried melatonin, advil PM, xanax and klonopin without success"  . Leukocytosis 11/07/2013   seen oncology, did not want to continue to follow there, so continued follow up at PCP.  . Osteoporosis   . Polyneuropathy   . Primary open angle glaucoma 01/2011  . Spinal stenosis   . Stable branch retinal vein occlusion 01/06/2012  . Type 2 diabetes mellitus ([place])    "diet controlled"    Patient Active Problem List   Diagnosis Date Noted  . Skin cancer 12/14/2018  . Changing skin lesion 11/23/2018  . Mild cognitive impairment 08/06/2018  . Right lumbar  radiculopathy 09/25/2017  . At moderate risk for fall 09/25/2017  . Use of cane as ambulatory aid 12/16/2016  . Chronic kidney disease, stage 3 ([place]) 12/16/2016  . Hyperlipidemia   . Osteoporosis   . Polyneuropathy   . Spinal stenosis   . Hard of hearing 12/02/2015  . Monoclonal B-cell lymphocytosis 11/07/2013  . Blind hypertensive eye 04/18/2012  . Diabetes mellitus, type II ([place]) 03/06/2012  . Essential hypertension 03/06/2012  . Branch retinal vein occlusion 01/06/2012  . Primary open angle glaucoma 01/06/2012  . Pseudophakia 01/06/2012  . Stable branch retinal vein occlusion 01/06/2012    Past Surgical History:  Procedure Laterality Date  . EYE SURGERY    . [place]  2005  . TOTAL HIP ARTHROPLASTY Left 1991  . TOTAL HIP ARTHROPLASTY Right ?   x 2     OB History    Gravida  3   Para  3   Term      Preterm      AB      Living  3     SAB      TAB      Ectopic      Multiple      Live Births              Family History  Problem Relation Age of Onset  . Heart disease Brother  Social History   Tobacco Use  . Smoking status: Never Smoker  . Smokeless tobacco: Never Used  Substance Use Topics  . Alcohol use: No  . Drug use: No    Home Medications Prior to Admission medications   Medication Sig Start Date End Date Taking? Authorizing Provider  aspirin 81 MG EC tablet Take 1 tablet by mouth daily.    [provider]  Cholecalciferol (VITAMIN D-1000 MAX ST) 1000 units tablet Take 2 tablets by mouth daily. 06/15/12   [provider]  dorzolamide-timolol (COSOPT) 22.3-6.8 MG/ML ophthalmic solution Place 1 drop into the right eye 2 (two) times daily.  01/06/12   [provider]  gabapentin (NEURONTIN) 300 MG capsule Take 2 capsules (600 mg total) by mouth 2 (two) times daily. 11/30/18   Kuneff, Renee A, DO  glipiZIDE (GLUCOTROL XL) 2.5 MG 24 hr tablet TAKE 1 TABLET BY MOUTH  DAILY WITH BREAKFAST Patient taking  differently: Take 2.5 mg by mouth daily with breakfast.  04/01/19   Kuneff, Renee A, DO  hydrALAZINE (APRESOLINE) 25 MG tablet TAKE 1 TABLET BY MOUTH  EVERY 8 HOURS. 04/01/19   Kuneff, Renee A, DO  KRILL OIL PO Take by mouth.    [provider]  latanoprost (XALATAN) 0.005 % ophthalmic solution Place 1 drop into the left eye at bedtime.  04/18/16   [provider]  lisinopril (ZESTRIL) 30 MG tablet TAKE 1 TABLET BY MOUTH  DAILY 04/01/19   Kuneff, Renee A, DO  MELATONIN PO Take by mouth.    [provider]  metoprolol succinate (TOPROL-XL) 25 MG 24 hr tablet Take 1 tablet (25 mg total) by mouth daily. 01/08/19   Kuneff, Renee A, DO  pravastatin (PRAVACHOL) 10 MG tablet Take 1 tablet (10 mg total) by mouth daily. 01/08/19   Kuneff, Renee A, DO    Allergies    Amlodipine besylate and Flu virus vaccine  Review of Systems   Review of Systems  Unable to perform ROS: Dementia  Gastrointestinal: Positive for abdominal pain, diarrhea, nausea and vomiting.  Psychiatric/Behavioral: Positive for confusion.    Physical Exam Updated Vital Signs BP (!) 125/54   Pulse 77   Temp 98.6 F (37 C) (Oral)   Resp 13   Ht 5' 4.5" (1.638 m)   Wt 65.2 kg   SpO2 97%   BMI 24.29 kg/m   Physical Exam Vitals and nursing note reviewed.  Constitutional:      General: She is not in acute distress.    Appearance: Normal appearance. She is well-developed. She is not ill-appearing.     Comments: Elderly female in NAD. Very hard of hearing. Difficult historian but able to answer simple questions  HENT:     Head: Normocephalic and atraumatic.  Eyes:     General: No scleral icterus.       Right eye: No discharge.        Left eye: No discharge.     Conjunctiva/sclera: Conjunctivae normal.     Pupils: Pupils are equal, round, and reactive to light.  Cardiovascular:     Rate and Rhythm: Normal rate and regular rhythm.  Pulmonary:     Effort: Pulmonary effort is normal. No respiratory  distress.     Breath sounds: Normal breath sounds.  Abdominal:     General: There is no distension.     Palpations: Abdomen is soft.     Tenderness: There is abdominal tenderness (diffuse).  Musculoskeletal:     Cervical back:  Normal range of motion.  Skin:    General: Skin is warm and dry.  Neurological:     Mental Status: She is alert and oriented to person, place, and time.     Comments: Lying on stretcher in NAD. GCS 15. Speaks in a clear voice. Cranial nerves II through XII grossly intact. 5/5 strength in all extremities. Sensation fully intact.  Bilateral finger-to-nose intact. Gait not tested   Psychiatric:        Behavior: Behavior normal.     ED Results / Procedures / Treatments   Labs (all labs ordered are listed, but only abnormal results are displayed) Labs Reviewed  CBC - Abnormal; Notable for the following components:      Result Value   WBC 23.1 (*)    All other components within normal limits  DIFFERENTIAL - Abnormal; Notable for the following components:   Neutro Abs 14.8 (*)    Lymphs Abs 6.5 (*)    Monocytes Absolute 1.4 (*)    Abs Immature Granulocytes 0.22 (*)    All other components within normal limits  COMPREHENSIVE METABOLIC PANEL - Abnormal; Notable for the following components:   Sodium 130 (*)    Chloride 96 (*)    BUN 48 (*)    Creatinine, Ser 2.96 (*)    Albumin 3.4 (*)    GFR calc non Af Amer 13 (*)    GFR calc Af Amer 16 (*)    All other components within normal limits  TSH - Abnormal; Notable for the following components:   TSH 4.982 (*)    All other components within normal limits  SEDIMENTATION RATE - Abnormal; Notable for the following components:   Sed Rate 34 (*)    All other components within normal limits  I-STAT CHEM 8, ED - Abnormal; Notable for the following components:   Sodium 130 (*)    Chloride 96 (*)    BUN 45 (*)    Creatinine, Ser 3.10 (*)    All other components within normal limits  CULTURE, BLOOD (ROUTINE X 2)    CULTURE, BLOOD (ROUTINE X 2)  URINE CULTURE  SARS CORONAVIRUS 2 (TAT 6-24 HRS)  ETHANOL  PROTIME-INR  APTT  LACTIC ACID, PLASMA  LACTIC ACID, PLASMA  VITAMIN B12  AMMONIA  RAPID URINE DRUG SCREEN, HOSP PERFORMED  URINALYSIS, ROUTINE W REFLEX MICROSCOPIC  CBG MONITORING, ED  POC SARS CORONAVIRUS 2 AG -  ED    EKG EKG Interpretation  Date/Time:  Tuesday May 14 2019 20:46:58 EST Ventricular Rate:  88 PR Interval:    QRS Duration: 126 QT Interval:  400 QTC Calculation: 441 R Axis:   -38 Text Interpretation: Sinus rhythm Atrial premature complexes Nonspecific IVCD with LAD Probable anteroseptal infarct, old Nonspecific T abnormalities, lateral leads Confirmed by Lacretia Leigh ([id number]) on 05/14/2019 9:01:32 PM   Radiology CT HEAD CODE STROKE WO CONTRAST  Result Date: 05/14/2019 CLINICAL DATA:  Code stroke.  Altered mental status EXAM: CT HEAD WITHOUT CONTRAST TECHNIQUE: Contiguous axial images were obtained from the base of the skull through the vertex without intravenous contrast. COMPARISON:  None. FINDINGS: Brain: Generalized atrophy, moderate in degree. Moderate hypodensity throughout the cerebral white matter extending into the left internal capsule compatible chronic ischemia. Negative for acute hemorrhage or mass. No midline shift. Vascular: Atherosclerotic calcification bilaterally. Negative for hyperdense vessel Skull: Negative Sinuses/Orbits: Complete opacification left maxillary sinus with mucosal edema extending into the left frontal and ethmoid sinus. No air-fluid levels. Left maxillary sinus secretions  are mildly hyperdense. Negative for orbital mass.  Bilateral cataract extraction Other: None ASPECTS ([place] Stroke Program Early CT Score) - Ganglionic level infarction (caudate, lentiform nuclei, internal capsule, insula, M1-M3 cortex): 7 - Supraganglionic infarction (M4-M6 cortex): 3 Total score (0-10 with 10 being normal): 10 IMPRESSION: 1. No acute abnormality 2.  ASPECTS is 10 3. Moderate atrophy and moderate chronic microvascular ischemic change 4. These results were called by telephone at the time of interpretation on 05/14/2019 at 8:27 pm to provider Lindzen , who verbally acknowledged these results. Electronically Signed   By: Franchot Gallo M.D.   On: 05/14/2019 20:28    Procedures Procedures (including critical care time)  CRITICAL CARE Performed by: Recardo Evangelist   Total critical care time: 35 minutes  Critical care time was exclusive of separately billable procedures and treating other patients.  Critical care was necessary to treat or prevent imminent or life-threatening deterioration.  Critical care was time spent personally by me on the following activities: development of treatment plan with patient and/or surrogate as well as nursing, discussions with consultants, evaluation of patient's response to treatment, examination of patient, obtaining history from patient or surrogate, ordering and performing treatments and interventions, ordering and review of laboratory studies, ordering and review of radiographic studies, pulse oximetry and re-evaluation of patient's condition.   Medications Ordered in ED Medications  cefTRIAXone (ROCEPHIN) 2 g in sodium chloride 0.9 % 100 mL IVPB (has no administration in time range)    And  metroNIDAZOLE (FLAGYL) IVPB 500 mg (has no administration in time range)  sodium chloride 0.9 % bolus 1,000 mL (0 mLs Intravenous Stopped 05/14/19 2316)    ED Course  I have reviewed the triage vital signs and the nursing notes.  Pertinent labs & imaging results that were available during my care of the patient were reviewed by me and considered in my medical decision making (see chart for details).  86 year old female presents as a code stroke with AMS. Vitals are reassuring here. Pt is pleasantly confused and following commands. No focal deficits noted. CT head is negative. I-stat chem 8 shows AKI - SCr is 3.1  up from a baseline of 1.   CBC is remarkable for leukocytosis of 23. CMP is remarkable for mild hyponatremia (130), BUN 48, SCr 2.9. Lactate is normal. Sepsis order set utilized but pt does not meet SIRS criteria so will hold off on code sepsis and 30cc/kg bolus. She is having abdominal tenderness and reports N/V/D. Will obtain CT abdomen/pelvis and give IV fluids.  CXR is normal. CT shows colitis. Will start cipro/flagyl. Neurology is recommending MRI brain and MRA. Please see consult note for details. Shared visit with Dr. Zenia Resides. Will admit for further mangement  Discussed with Dr. Marlowe Sax who will admit for colitis, possible sepsis, AKI, and altered mental status.  MDM Rules/Calculators/A&P                       Final Clinical Impression(s) / ED Diagnoses Final diagnoses:  Colitis  AKI (acute kidney injury) [facility])  Encephalopathy    Rx / DC Orders ED Discharge Orders    None       Recardo Evangelist, PA-C 05/15/19 0058    Lacretia Leigh, MD 05/15/19 2253

## 2019-05-14 NOTE — ED Notes (Signed)
Pt has a brief episode of acute confusion. She believed to be at a wedding. Was disoriented to place and situation.  Pt has now returned to baseline. PA Claiborne Billings informed

## 2019-05-14 NOTE — ED Provider Notes (Signed)
Medical screening examination/treatment/procedure(s) were conducted as a shared visit with non-physician practitioner(s) and myself.  I personally evaluated the patient during the encounter.  EKG Interpretation  Date/Time:  Tuesday May 14 2019 20:46:58 EST Ventricular Rate:  88 PR Interval:    QRS Duration: 126 QT Interval:  400 QTC Calculation: 441 R Axis:   -38 Text Interpretation: Sinus rhythm Atrial premature complexes Nonspecific IVCD with LAD Probable anteroseptal infarct, old Nonspecific T abnormalities, lateral leads Confirmed by Lacretia Leigh ([id number]) on 05/14/2019 9:24:63 PM  86 year old female here presenting for altered mental status.  Was initially called a code stroke but then later found out that her symptoms have been for several days.  Laboratory studies show acute kidney injury as well as some abdominal discomfort mostly in her upper quadrant.  Concern for possible intra-abdominal etiology.  Hydrate here and perform noncontrast abdominal CT and patient require admission   Lacretia Leigh, MD 05/14/19 2107

## 2019-05-14 NOTE — Consult Note (Addendum)
NEURO HOSPITALIST CONSULT NOTE   Requestig physician: Dr. Zenia Resides  Reason for Consult: AMS  History obtained from:  EMS and Chart     HPI:                                                                                                                                          Madison Martinez is an 86 y.o. female presenting acutely from home via EMS for AMS and dysphasia. She was LKN on Sunday at 1900. At baseline she is able to walk normally and handles all of her own affairs. She was found on the ground at 1300 on Monday. When they helped her up, they noticed that she was "sluggish" and that "something was off". They noted a small lump on the right side of her head along the frontal scalp. When she was checked on again at 1530 today, she was noted to be confused and aphasic. EMS was called. She was aphasic per EMS on scene with difficulty following commands and unable to name objects.   PMHx includes glaucoma (left eye), HOH, HLD, HTN, spinal stenosis, DM2 and history of BRVO.   Home meds include ASA, pravastatin and Neurontin (600 mg BID)..   Past Medical History:  Diagnosis Date  . Blind hypertensive eye, left 04/18/2012  . Gait disturbance 10/05/2015   pt declined neuro referral per records  . Hard of hearing 12/02/2015   hearing aids  . Hyperlipidemia   . Hypertension   . Insomnia    "tried melatonin, advil PM, xanax and klonopin without success"  . Leukocytosis 11/07/2013   seen oncology, did not want to continue to follow there, so continued follow up at PCP.  . Osteoporosis   . Polyneuropathy   . Primary open angle glaucoma 01/2011  . Spinal stenosis   . Stable branch retinal vein occlusion 01/06/2012  . Type 2 diabetes mellitus ([place])    "diet controlled"    Past Surgical History:  Procedure Laterality Date  . EYE SURGERY    . [place]  2005  . TOTAL HIP ARTHROPLASTY Left 1991  . TOTAL HIP ARTHROPLASTY Right ?   x 2    Family History  Problem  Relation Age of Onset  . Heart disease Brother               Social History:  reports that she has never smoked. She has never used smokeless tobacco. She reports that she does not drink alcohol or use drugs.  Allergies  Allergen Reactions  . Amlodipine Besylate     Other reaction(s): Vertigo  . Flu Virus Vaccine Swelling    HOME MEDICATIONS:  No current facility-administered medications on file prior to encounter.   Current Outpatient Medications on File Prior to Encounter  Medication Sig Dispense Refill  . aspirin 81 MG EC tablet Take 1 tablet by mouth daily.    . Cholecalciferol (VITAMIN D-1000 MAX ST) 1000 units tablet Take 2 tablets by mouth daily.    . dorzolamide-timolol (COSOPT) 22.3-6.8 MG/ML ophthalmic solution Place 1 drop into the right eye 2 (two) times daily.     Marland Kitchen gabapentin (NEURONTIN) 300 MG capsule Take 2 capsules (600 mg total) by mouth 2 (two) times daily. 120 capsule 1  . glipiZIDE (GLUCOTROL XL) 2.5 MG 24 hr tablet TAKE 1 TABLET BY MOUTH  DAILY WITH BREAKFAST 90 tablet 3  . hydrALAZINE (APRESOLINE) 25 MG tablet TAKE 1 TABLET BY MOUTH  EVERY 8 HOURS. 270 tablet 3  . KRILL OIL PO Take by mouth.    . latanoprost (XALATAN) 0.005 % ophthalmic solution Place 1 drop into the left eye at bedtime.     Marland Kitchen lisinopril (ZESTRIL) 30 MG tablet TAKE 1 TABLET BY MOUTH  DAILY 90 tablet 3  . MELATONIN PO Take by mouth.    . metoprolol succinate (TOPROL-XL) 25 MG 24 hr tablet Take 1 tablet (25 mg total) by mouth daily. 7 tablet 0  . pravastatin (PRAVACHOL) 10 MG tablet Take 1 tablet (10 mg total) by mouth daily. 7 tablet 0     ROS:                                                                                                                                       Unable to obtain comprehensive ROS due to dysphasia.    There were no vitals taken for this  visit.   General Examination:                                                                                                       Physical Exam  HEENT-  Normocephalic   Lungs- Respirations unlabored Extremities- No edema  Neurological Examination Mental Status: Alert. Poor orientation. Questions and commands need to be repeated multiple times for responses, but will follow motor commands after repetition - about 75% of simple commands are followed correctly. Speech is fluent but sparse. No dysarthria. Unable to count fingers or name objects. Cranial Nerves: II:  Visual fields grossly intact with no extinction to DSS.   III,IV, VI: No ptosis. Will track to left and right . V,VII: No facial droop. Facial sensation grossly  intact in context of difficulty comprehending questions regarding sensation.  VIII: Multiple loud repetitions of questions and commands required IX,X: No hypophonia XI: Symmetric XII: midline tongue extension Motor: RUE 5/5 RLE with hip pain on elevation against gravity. Knee extension 4/5 LUE and LLE 5/5 No pronator drift Sensory: Temp equal x 4. Light touch intact throughout, bilaterally. No extinction to DSS BUE.  Deep Tendon Reflexes: 2+ and symmetric bilateral upper extremities. 1+ right patella, 0 left patella. 0 achilles bilaterally.  Plantars: Equivocal bilaterally  Cerebellar: No gross ataxia on limited testing of FNF - had difficulty following commands.  Gait: Deferred   Lab Results: Basic Metabolic Panel: No results for input(s): NA, K, CL, CO2, GLUCOSE, BUN, CREATININE, CALCIUM, MG, PHOS in the last 168 hours.  CBC: No results for input(s): WBC, NEUTROABS, HGB, HCT, MCV, PLT in the last 168 hours.  Cardiac Enzymes: No results for input(s): CKTOTAL, CKMB, CKMBINDEX, TROPONINI in the last 168 hours.  Lipid Panel: No results for input(s): CHOL, TRIG, HDL, CHOLHDL, VLDL, LDLCALC in the last 168 hours.  Imaging:  CT head: 1. No acute  abnormality 2. ASPECTS is 10 3. Moderate atrophy and moderate chronic microvascular ischemic change  Assessment: 86 year old female with AMS and dysphasia 1. Exam reveals fluent speech output, although speech tends to be sparse. She has difficulty responding to questions and commands, which may be secondary to hearing loss or receptive aphasia.  2. Highest on the syndromic DDx for her presentation would be left perisylvian stroke versus AMS secondary to toxic/metabolic or infectious etiologies. Sedation from Neurontin could be playing a role. No seizure activity noted on exam, but the fall could have been from a seizure with postictal confusion therefore also being on the DDx.  3. CT head shows no acute abnormality. Mild diffuse atrophy and chronic small vessel ischemic changes are noted.  4. Elevated BUN and Cr have worsened significantly since labs drawn in the Fall. Mild uremic encephalopathy versus confusion in the setting of volume depletion is on the DDx.  5. Leukocytosis with WBC of 23.1. This increases the likelihood of an infection as the etiology for her presentation.  6. Unable to obtain CTA due to low eGFR. Also cannot use contrast for MRI.  7. Not a tPA or endovascular candidate due to time criteria.   Recommendations: 1. Continue ASA and pravastatin.  2. Decrease home Neurontin (600 mg BID) to 300 mg BID. 3. MRI brain 4. MRA head 5. TTE 6. Carotid ultrasound 7. Permissive HTN x 24 hours 8. PT/OT/Speech 9. Toxic/metabolic work up to include TSH, B12, ESR, ammonia 10. EEG (ordered)   Electronically signed: Dr. Kerney Elbe 05/14/2019, 8:15 PM

## 2019-05-14 NOTE — ED Notes (Signed)
Please call son Sharion Dove [phone] a status update--Ayodeji Keimig

## 2019-05-15 ENCOUNTER — Inpatient Hospital Stay (HOSPITAL_COMMUNITY): Payer: Medicare Other

## 2019-05-15 ENCOUNTER — Encounter (HOSPITAL_COMMUNITY): Payer: Self-pay | Admitting: *Deleted

## 2019-05-15 ENCOUNTER — Other Ambulatory Visit (HOSPITAL_COMMUNITY): Payer: Medicare Other

## 2019-05-15 ENCOUNTER — Telehealth: Payer: Self-pay

## 2019-05-15 DIAGNOSIS — E785 Hyperlipidemia, unspecified: Secondary | ICD-10-CM | POA: Diagnosis present

## 2019-05-15 DIAGNOSIS — I679 Cerebrovascular disease, unspecified: Secondary | ICD-10-CM | POA: Diagnosis not present

## 2019-05-15 DIAGNOSIS — I63232 Cerebral infarction due to unspecified occlusion or stenosis of left carotid arteries: Secondary | ICD-10-CM

## 2019-05-15 DIAGNOSIS — I671 Cerebral aneurysm, nonruptured: Secondary | ICD-10-CM | POA: Diagnosis present

## 2019-05-15 DIAGNOSIS — R4182 Altered mental status, unspecified: Secondary | ICD-10-CM | POA: Diagnosis not present

## 2019-05-15 DIAGNOSIS — E871 Hypo-osmolality and hyponatremia: Secondary | ICD-10-CM | POA: Diagnosis present

## 2019-05-15 DIAGNOSIS — Z79899 Other long term (current) drug therapy: Secondary | ICD-10-CM | POA: Diagnosis not present

## 2019-05-15 DIAGNOSIS — R4701 Aphasia: Secondary | ICD-10-CM | POA: Diagnosis present

## 2019-05-15 DIAGNOSIS — I340 Nonrheumatic mitral (valve) insufficiency: Secondary | ICD-10-CM | POA: Diagnosis not present

## 2019-05-15 DIAGNOSIS — I672 Cerebral atherosclerosis: Secondary | ICD-10-CM | POA: Diagnosis present

## 2019-05-15 DIAGNOSIS — N1831 Chronic kidney disease, stage 3a: Secondary | ICD-10-CM | POA: Diagnosis present

## 2019-05-15 DIAGNOSIS — I63322 Cerebral infarction due to thrombosis of left anterior cerebral artery: Secondary | ICD-10-CM | POA: Diagnosis not present

## 2019-05-15 DIAGNOSIS — Z7984 Long term (current) use of oral hypoglycemic drugs: Secondary | ICD-10-CM | POA: Diagnosis not present

## 2019-05-15 DIAGNOSIS — R4702 Dysphasia: Secondary | ICD-10-CM | POA: Diagnosis present

## 2019-05-15 DIAGNOSIS — G9340 Encephalopathy, unspecified: Secondary | ICD-10-CM

## 2019-05-15 DIAGNOSIS — Z20822 Contact with and (suspected) exposure to covid-19: Secondary | ICD-10-CM | POA: Diagnosis present

## 2019-05-15 DIAGNOSIS — I6340 Cerebral infarction due to embolism of unspecified cerebral artery: Secondary | ICD-10-CM | POA: Diagnosis present

## 2019-05-15 DIAGNOSIS — I63512 Cerebral infarction due to unspecified occlusion or stenosis of left middle cerebral artery: Secondary | ICD-10-CM | POA: Diagnosis not present

## 2019-05-15 DIAGNOSIS — R531 Weakness: Secondary | ICD-10-CM

## 2019-05-15 DIAGNOSIS — I129 Hypertensive chronic kidney disease with stage 1 through stage 4 chronic kidney disease, or unspecified chronic kidney disease: Secondary | ICD-10-CM | POA: Diagnosis present

## 2019-05-15 DIAGNOSIS — A09 Infectious gastroenteritis and colitis, unspecified: Secondary | ICD-10-CM | POA: Diagnosis present

## 2019-05-15 DIAGNOSIS — Z96643 Presence of artificial hip joint, bilateral: Secondary | ICD-10-CM | POA: Diagnosis present

## 2019-05-15 DIAGNOSIS — I639 Cerebral infarction, unspecified: Secondary | ICD-10-CM | POA: Diagnosis not present

## 2019-05-15 DIAGNOSIS — N179 Acute kidney failure, unspecified: Secondary | ICD-10-CM | POA: Diagnosis present

## 2019-05-15 DIAGNOSIS — H9190 Unspecified hearing loss, unspecified ear: Secondary | ICD-10-CM | POA: Diagnosis present

## 2019-05-15 DIAGNOSIS — H5440 Blindness, one eye, unspecified eye: Secondary | ICD-10-CM | POA: Diagnosis present

## 2019-05-15 DIAGNOSIS — I358 Other nonrheumatic aortic valve disorders: Secondary | ICD-10-CM | POA: Diagnosis not present

## 2019-05-15 DIAGNOSIS — E1142 Type 2 diabetes mellitus with diabetic polyneuropathy: Secondary | ICD-10-CM | POA: Diagnosis present

## 2019-05-15 DIAGNOSIS — I361 Nonrheumatic tricuspid (valve) insufficiency: Secondary | ICD-10-CM | POA: Diagnosis not present

## 2019-05-15 DIAGNOSIS — K529 Noninfective gastroenteritis and colitis, unspecified: Secondary | ICD-10-CM | POA: Diagnosis present

## 2019-05-15 DIAGNOSIS — G92 Toxic encephalopathy: Secondary | ICD-10-CM | POA: Diagnosis present

## 2019-05-15 DIAGNOSIS — E860 Dehydration: Secondary | ICD-10-CM | POA: Diagnosis present

## 2019-05-15 DIAGNOSIS — Z8249 Family history of ischemic heart disease and other diseases of the circulatory system: Secondary | ICD-10-CM | POA: Diagnosis not present

## 2019-05-15 DIAGNOSIS — K5730 Diverticulosis of large intestine without perforation or abscess without bleeding: Secondary | ICD-10-CM | POA: Diagnosis present

## 2019-05-15 DIAGNOSIS — T426X5A Adverse effect of other antiepileptic and sedative-hypnotic drugs, initial encounter: Secondary | ICD-10-CM | POA: Diagnosis present

## 2019-05-15 DIAGNOSIS — E1122 Type 2 diabetes mellitus with diabetic chronic kidney disease: Secondary | ICD-10-CM | POA: Diagnosis present

## 2019-05-15 HISTORY — DX: Weakness: R53.1

## 2019-05-15 LAB — BASIC METABOLIC PANEL
Anion gap: 10 (ref 5–15)
BUN: 36 mg/dL — ABNORMAL HIGH (ref 8–23)
CO2: 22 mmol/L (ref 22–32)
Calcium: 9 mg/dL (ref 8.9–10.3)
Chloride: 105 mmol/L (ref 98–111)
Creatinine, Ser: 1.92 mg/dL — ABNORMAL HIGH (ref 0.44–1.00)
GFR calc Af Amer: 26 mL/min — ABNORMAL LOW (ref >60)
GFR calc non Af Amer: 23 mL/min — ABNORMAL LOW (ref >60)
Glucose, Bld: 129 mg/dL — ABNORMAL HIGH (ref 70–99)
Potassium: 3.7 mmol/L (ref 3.5–5.1)
Sodium: 137 mmol/L (ref 135–145)

## 2019-05-15 LAB — LIPID PANEL
Cholesterol: 190 mg/dL (ref 0–200)
HDL: 31 mg/dL — ABNORMAL LOW (ref >40)
LDL Cholesterol: 104 mg/dL — ABNORMAL HIGH (ref 0–99)
Total CHOL/HDL Ratio: 6.1 RATIO
Triglycerides: 276 mg/dL — ABNORMAL HIGH (ref <150)
VLDL: 55 mg/dL — ABNORMAL HIGH (ref 0–40)

## 2019-05-15 LAB — GLUCOSE, CAPILLARY
Glucose-Capillary: 87 mg/dL (ref 70–99)
Glucose-Capillary: 105 mg/dL — ABNORMAL HIGH (ref 70–99)

## 2019-05-15 LAB — CBC
HCT: 36.4 % (ref 36.0–46.0)
Hemoglobin: 11.7 g/dL — ABNORMAL LOW (ref 12.0–15.0)
MCH: 30.8 pg (ref 26.0–34.0)
MCHC: 32.1 g/dL (ref 30.0–36.0)
MCV: 95.8 fL (ref 80.0–100.0)
Platelets: 184 10*3/uL (ref 150–400)
RBC: 3.8 MIL/uL — ABNORMAL LOW (ref 3.87–5.11)
RDW: 13.4 % (ref 11.5–15.5)
WBC: 17.4 10*3/uL — ABNORMAL HIGH (ref 4.0–10.5)
nRBC: 0 % (ref 0.0–0.2)

## 2019-05-15 LAB — HEMOGLOBIN A1C
Hgb A1c MFr Bld: 5.5 % (ref 4.8–5.6)
Mean Plasma Glucose: 111.15 mg/dL

## 2019-05-15 LAB — T4, FREE: Free T4: 0.8 ng/dL (ref 0.61–1.12)

## 2019-05-15 LAB — ECHOCARDIOGRAM COMPLETE
Height: 64.5 in
Weight: 2299.84 oz

## 2019-05-15 LAB — SEDIMENTATION RATE: Sed Rate: 34 mm/hr — ABNORMAL HIGH (ref 0–22)

## 2019-05-15 LAB — AMMONIA: Ammonia: 18 umol/L (ref 9–35)

## 2019-05-15 LAB — VITAMIN B12: Vitamin B-12: 564 pg/mL (ref 180–914)

## 2019-05-15 LAB — TSH: TSH: 4.982 u[IU]/mL — ABNORMAL HIGH (ref 0.350–4.500)

## 2019-05-15 LAB — SARS CORONAVIRUS 2 (TAT 6-24 HRS): SARS Coronavirus 2: NEGATIVE

## 2019-05-15 LAB — CK: Total CK: 117 U/L (ref 38–234)

## 2019-05-15 MED ORDER — STROKE: EARLY STAGES OF RECOVERY BOOK
Freq: Once | Status: AC
  Filled 2019-05-15: qty 1

## 2019-05-15 MED ORDER — PRAVASTATIN SODIUM 10 MG PO TABS
10.0000 mg | ORAL_TABLET | Freq: Every day | ORAL | Status: DC
  Administered 2019-05-15: 10 mg via ORAL
  Filled 2019-05-15: qty 1

## 2019-05-15 MED ORDER — SODIUM CHLORIDE 0.9 % IV SOLN
2.0000 g | Freq: Once | INTRAVENOUS | Status: AC
  Administered 2019-05-15: 2 g via INTRAVENOUS
  Filled 2019-05-15: qty 20

## 2019-05-15 MED ORDER — ASPIRIN EC 81 MG PO TBEC: 81.0000 mg | DELAYED_RELEASE_TABLET | Freq: Every day | ORAL | Status: DC

## 2019-05-15 MED ORDER — MELATONIN 3 MG PO TABS
6.0000 mg | ORAL_TABLET | Freq: Every evening | ORAL | Status: DC | PRN
  Administered 2019-05-17: 6 mg via ORAL
  Filled 2019-05-15 (×2): qty 2

## 2019-05-15 MED ORDER — PRAVASTATIN SODIUM 40 MG PO TABS
40.0000 mg | ORAL_TABLET | Freq: Every day | ORAL | Status: DC
  Administered 2019-05-16 – 2019-05-17 (×2): 40 mg via ORAL
  Filled 2019-05-15 (×2): qty 1

## 2019-05-15 MED ORDER — INSULIN ASPART 100 UNIT/ML ~~LOC~~ SOLN: 0.0000 [IU] | Freq: Three times a day (TID) | SUBCUTANEOUS | Status: DC

## 2019-05-15 MED ORDER — ASPIRIN EC 325 MG PO TBEC
325.0000 mg | DELAYED_RELEASE_TABLET | Freq: Every day | ORAL | Status: DC
  Administered 2019-05-15 – 2019-05-18 (×4): 325 mg via ORAL
  Filled 2019-05-15 (×4): qty 1

## 2019-05-15 MED ORDER — DORZOLAMIDE HCL-TIMOLOL MAL 2-0.5 % OP SOLN
1.0000 [drp] | Freq: Two times a day (BID) | OPHTHALMIC | Status: DC
  Administered 2019-05-15: 1 [drp] via OPHTHALMIC
  Filled 2019-05-15: qty 10

## 2019-05-15 MED ORDER — SODIUM CHLORIDE 0.9 % IV SOLN: INTRAVENOUS | Status: DC

## 2019-05-15 MED ORDER — HEPARIN SODIUM (PORCINE) 5000 UNIT/ML IJ SOLN
5000.0000 [IU] | Freq: Three times a day (TID) | INTRAMUSCULAR | Status: DC
  Administered 2019-05-15 – 2019-05-18 (×10): 5000 [IU] via SUBCUTANEOUS
  Filled 2019-05-15 (×10): qty 1

## 2019-05-15 MED ORDER — SODIUM CHLORIDE 0.9 % IV SOLN
2.0000 g | INTRAVENOUS | Status: DC
  Administered 2019-05-16 – 2019-05-17 (×2): 2 g via INTRAVENOUS
  Filled 2019-05-15 (×3): qty 20

## 2019-05-15 MED ORDER — ACETAMINOPHEN 650 MG RE SUPP: 650.0000 mg | RECTAL | Status: DC | PRN

## 2019-05-15 MED ORDER — METRONIDAZOLE IN NACL 5-0.79 MG/ML-% IV SOLN
500.0000 mg | Freq: Once | INTRAVENOUS | Status: AC
  Administered 2019-05-15: 500 mg via INTRAVENOUS
  Filled 2019-05-15: qty 100

## 2019-05-15 MED ORDER — ACETAMINOPHEN 160 MG/5ML PO SOLN: 650.0000 mg | ORAL | Status: DC | PRN

## 2019-05-15 MED ORDER — ONE-A-DAY WOMENS 50 PLUS PO TABS: 1.0000 | ORAL_TABLET | Freq: Every day | ORAL | Status: DC

## 2019-05-15 MED ORDER — LATANOPROST 0.005 % OP SOLN
1.0000 [drp] | Freq: Every day | OPHTHALMIC | Status: DC
  Filled 2019-05-15: qty 2.5

## 2019-05-15 MED ORDER — MORPHINE SULFATE (PF) 2 MG/ML IV SOLN: 1.0000 mg | INTRAVENOUS | Status: DC | PRN

## 2019-05-15 MED ORDER — CLOPIDOGREL BISULFATE 75 MG PO TABS
75.0000 mg | ORAL_TABLET | Freq: Every day | ORAL | Status: DC
  Administered 2019-05-15 – 2019-05-18 (×4): 75 mg via ORAL
  Filled 2019-05-15 (×4): qty 1

## 2019-05-15 MED ORDER — GABAPENTIN 300 MG PO CAPS
300.0000 mg | ORAL_CAPSULE | Freq: Two times a day (BID) | ORAL | Status: DC
  Administered 2019-05-15 – 2019-05-18 (×7): 300 mg via ORAL
  Filled 2019-05-15 (×7): qty 1

## 2019-05-15 MED ORDER — INSULIN ASPART 100 UNIT/ML ~~LOC~~ SOLN: 0.0000 [IU] | Freq: Every day | SUBCUTANEOUS | Status: DC

## 2019-05-15 MED ORDER — LATANOPROSTENE BUNOD 0.024 % OP SOLN: 1.0000 [drp] | Freq: Every day | OPHTHALMIC | Status: DC

## 2019-05-15 MED ORDER — DORZOLAMIDE HCL-TIMOLOL MAL 2-0.5 % OP SOLN
1.0000 [drp] | Freq: Two times a day (BID) | OPHTHALMIC | Status: DC
  Administered 2019-05-15 – 2019-05-18 (×6): 1 [drp] via OPHTHALMIC
  Filled 2019-05-15: qty 10

## 2019-05-15 MED ORDER — VITAMIN D 25 MCG (1000 UNIT) PO TABS
1000.0000 [IU] | ORAL_TABLET | Freq: Every day | ORAL | Status: DC
  Administered 2019-05-15 – 2019-05-18 (×4): 1000 [IU] via ORAL
  Filled 2019-05-15 (×4): qty 1

## 2019-05-15 MED ORDER — METOPROLOL SUCCINATE ER 25 MG PO TB24
25.0000 mg | ORAL_TABLET | Freq: Every day | ORAL | Status: DC
  Administered 2019-05-15 – 2019-05-18 (×4): 25 mg via ORAL
  Filled 2019-05-15 (×4): qty 1

## 2019-05-15 MED ORDER — ADULT MULTIVITAMIN W/MINERALS CH
1.0000 | ORAL_TABLET | Freq: Every day | ORAL | Status: DC
  Administered 2019-05-15 – 2019-05-18 (×4): 1 via ORAL
  Filled 2019-05-15 (×4): qty 1

## 2019-05-15 MED ORDER — ASPIRIN 300 MG RE SUPP: 300.0000 mg | Freq: Every day | RECTAL | Status: DC

## 2019-05-15 MED ORDER — ACETAMINOPHEN 325 MG PO TABS: 650.0000 mg | ORAL_TABLET | ORAL | Status: DC | PRN

## 2019-05-15 MED ORDER — METRONIDAZOLE IN NACL 5-0.79 MG/ML-% IV SOLN
500.0000 mg | Freq: Three times a day (TID) | INTRAVENOUS | Status: DC
  Administered 2019-05-15 – 2019-05-16 (×4): 500 mg via INTRAVENOUS
  Filled 2019-05-15 (×4): qty 100

## 2019-05-15 NOTE — Plan of Care (Signed)
Patient admitted this shift for AMS. VSS.

## 2019-05-15 NOTE — H&P (Signed)
History and Physical    Madison Martinez QIH:[id number] DOB: Mar 11, 1928 DOA: 05/14/2019  PCP: Ma Hillock, DO Patient coming from: Home  Chief Complaint: Altered mental status, dysphagia  HPI: Madison Martinez is a 87 y.o. female with medical history significant of glaucoma/left eye blindness, gait disturbance, difficulty hearing, hypertension, hyperlipidemia, polyneuropathy, spinal stenosis, non-insulin-dependent diabetes mellitus presenting to the ED via EMS for evaluation of altered mental status and dysphagia.  Patient was last known normal on 2/21 at 1900.  At baseline, she is AAO x4 and completely independent.  Per EMS, family noted that patient was not acting like self and was a bit sluggish and they found a bump on her head.  On 2/23 patient was found to be confused and EMS was called.  She was aphasic per EMS with difficulty following commands and unable to name objects.  History very limited given patient's confusion and difficulty hearing.  She reports [place] history of generalized abdominal pain, nausea, vomiting, diarrhea.  Denies chest pain, shortness of breath, or cough.  She does not remember falling at home.  No additional history could be obtained from her.  ED Course: Afebrile and hemodynamically stable.  WBC count 23.1.  Lactic acid normal.  Sodium 130.  BUN 48, creatinine 2.9.  Baseline creatinine 1.0-1.1.  Blood ethanol level undetectable.  B12 level normal.  Ammonia level normal.  ESR pending.  TSH borderline elevated at 4.98.  UA and urine culture pending.  Blood culture x2 pending.  UDS pending.  SARS-CoV-2 rapid antigen test negative, PCR test pending. Chest x-ray showing no active disease. Head CT negative for acute intracranial abnormality. Neurology was consulted. Noted to have abdominal discomfort on exam and noncontrast CT was done which revealed colitis involving the descending colon, likely inflammatory/infectious in etiology.  Patient received a 1 L fluid  bolus.   Review of Systems:  All systems reviewed and apart from history of presenting illness, are negative.  Past Medical History:  Diagnosis Date  . Blind hypertensive eye, left 04/18/2012  . Gait disturbance 10/05/2015   pt declined neuro referral per records  . Hard of hearing 12/02/2015   hearing aids  . Hyperlipidemia   . Hypertension   . Insomnia    "tried melatonin, advil PM, xanax and klonopin without success"  . Leukocytosis 11/07/2013   seen oncology, did not want to continue to follow there, so continued follow up at PCP.  . Osteoporosis   . Polyneuropathy   . Primary open angle glaucoma 01/2011  . Spinal stenosis   . Stable branch retinal vein occlusion 01/06/2012  . Type 2 diabetes mellitus ([place])    "diet controlled"    Past Surgical History:  Procedure Laterality Date  . EYE SURGERY    . [place]  2005  . TOTAL HIP ARTHROPLASTY Left 1991  . TOTAL HIP ARTHROPLASTY Right ?   x 2     reports that she has never smoked. She has never used smokeless tobacco. She reports that she does not drink alcohol or use drugs.  Allergies  Allergen Reactions  . Amlodipine Besylate Other (See Comments)    Vertigo  . Flu Virus Vaccine Swelling    Site of swelling not known by son    Family History  Problem Relation Age of Onset  . Heart disease Brother     Prior to Admission medications   Medication Sig Start Date End Date Taking? Authorizing Provider  aspirin 81 MG EC tablet Take 81 mg by  mouth daily. Swallow whole.   Yes [provider]  Ca Carb-Glucos-Chond-Phelloden (FLEXI JOINT RELIEF PLUS PO) Take 2 capsules by mouth daily.   Yes [provider]  Cholecalciferol (VITAMIN D-3) 25 MCG (1000 UT) CAPS Take 1,000 Units by mouth daily.   Yes [provider]  docusate sodium (PHILLIPS STOOL SOFTENER) 100 MG capsule Take 100 mg by mouth 2 (two) times daily as needed for mild constipation.   Yes [provider]   dorzolamide-timolol (COSOPT) 22.3-6.8 MG/ML ophthalmic solution Place 1 drop into the left eye 2 (two) times daily.  01/06/12  Yes [provider]  glipiZIDE (GLUCOTROL XL) 2.5 MG 24 hr tablet TAKE 1 TABLET BY MOUTH  DAILY WITH BREAKFAST Patient taking differently: Take 2.5 mg by mouth daily with breakfast.  04/01/19  Yes Kuneff, Renee A, DO  hydrALAZINE (APRESOLINE) 25 MG tablet TAKE 1 TABLET BY MOUTH  EVERY 8 HOURS. Patient taking differently: Take 25 mg by mouth every 8 (eight) hours. . 04/01/19  Yes Kuneff, Renee A, DO  KRILL OIL PO Take 1 capsule by mouth daily.    Yes [provider]  lisinopril (ZESTRIL) 30 MG tablet TAKE 1 TABLET BY MOUTH  DAILY Patient taking differently: Take 30 mg by mouth daily.  04/01/19  Yes Kuneff, Renee A, DO  Melatonin 5 MG TABS Take 5 mg by mouth at bedtime as needed (for sleep).   Yes [provider]  metoprolol succinate (TOPROL-XL) 25 MG 24 hr tablet Take 1 tablet (25 mg total) by mouth daily. 01/08/19  Yes Kuneff, Renee A, DO  Multiple Vitamins-Minerals (ONE-A-[place] WOMENS 50 PLUS) TABS Take 1 tablet by mouth daily.   Yes [provider]  NON FORMULARY Take 2 capsules by mouth See admin instructions. Selenex [place] Best for Glutathione Production, Immune, Liver & Anti-Aging Support capsules; Take 2 capsules by mouth daily   Yes [provider]  NON FORMULARY Take 2 capsules by mouth See admin instructions. UriVArx - Clinically Proven Bladder Control Capsules: Take 2 capsules by mouth once a [place]   Yes [provider]  polyethylene glycol powder (GLYCOLAX/MIRALAX) 17 GM/SCOOP powder Take 17 g by mouth daily as needed for mild constipation.   Yes [provider]  pravastatin (PRAVACHOL) 10 MG tablet Take 1 tablet (10 mg total) by mouth daily. 01/08/19  Yes Kuneff, Renee A, DO  VYZULTA 0.024 % SOLN Place 1 drop into the left eye at bedtime.  04/25/19  Yes [provider]  gabapentin (NEURONTIN) 300 MG  capsule Take 2 capsules (600 mg total) by mouth 2 (two) times daily. Patient not taking: Reported on 05/14/2019 11/30/18   Ma Hillock, DO    Physical Exam: Vitals:   05/15/19 0000 05/15/19 0015 05/15/19 0030 05/15/19 0039  BP: (!) 114/54 (!) 117/50 (!) 109/45   Pulse: 80 80 (!) 51 78  Resp:  (!) 23  19  Temp:      TempSrc:      SpO2: 96% 93% 92% 95%  Weight:      Height:        Physical Exam  Constitutional: She appears well-developed and well-nourished. No distress.  HENT:  Head: Normocephalic.  Eyes: Right eye exhibits no discharge. Left eye exhibits no discharge.  Cardiovascular: Normal rate, regular rhythm and intact distal pulses.  Pulmonary/Chest: Effort normal and breath sounds normal. No respiratory distress. She has no wheezes. She has no rales.  Abdominal: Soft. Bowel sounds are normal. She exhibits no distension. There  is abdominal tenderness. There is no rebound and no guarding.  Mild generalized tenderness to palpation  Musculoskeletal:        General: No edema.     Cervical back: Neck supple.  Neurological:  Awake and alert Oriented to self and knows it is 2020 Not oriented to place No focal motor or sensory deficit. Speech fluent  Skin: Skin is warm and dry. She is not diaphoretic.     Labs on Admission: I have personally reviewed following labs and imaging studies  CBC: Recent Labs  Lab 05/14/19 2012 05/14/19 2021  WBC 23.1*  --   NEUTROABS 14.8*  --   HGB 12.4 12.2  HCT 37.3 36.0  MCV 95.2  --   PLT 198  --    Basic Metabolic Panel: Recent Labs  Lab 05/14/19 2012 05/14/19 2021  NA 130* 130*  K 4.1 4.1  CL 96* 96*  CO2 22  --   GLUCOSE 96 92  BUN 48* 45*  CREATININE 2.96* 3.10*  CALCIUM 9.2  --    GFR: Estimated Creatinine Clearance: 10.9 mL/min (A) (by C-G formula based on SCr of 3.1 mg/dL (H)). Liver Function Tests: Recent Labs  Lab 05/14/19 2012  AST 30  ALT 16  ALKPHOS 68  BILITOT 0.6  PROT 6.6  ALBUMIN 3.4*   No  results for input(s): LIPASE, AMYLASE in the last 168 hours. Recent Labs  Lab 05/14/19 2325  AMMONIA 18   Coagulation Profile: Recent Labs  Lab 05/14/19 2012  INR 1.1   Cardiac Enzymes: No results for input(s): CKTOTAL, CKMB, CKMBINDEX, TROPONINI in the last 168 hours. BNP (last 3 results) No results for input(s): PROBNP in the last 8760 hours. HbA1C: No results for input(s): HGBA1C in the last 72 hours. CBG: Recent Labs  Lab 05/14/19 2011  GLUCAP 91   Lipid Profile: No results for input(s): CHOL, HDL, LDLCALC, TRIG, CHOLHDL, LDLDIRECT in the last 72 hours. Thyroid Function Tests: Recent Labs    05/14/19 2305  TSH 4.982*   Anemia Panel: Recent Labs    05/14/19 2305  VITAMINB12 564   Urine analysis: No results found for: COLORURINE, APPEARANCEUR, LABSPEC, PHURINE, GLUCOSEU, HGBUR, BILIRUBINUR, KETONESUR, PROTEINUR, UROBILINOGEN, NITRITE, LEUKOCYTESUR  Radiological Exams on Admission: CT Abdomen Pelvis Wo Contrast  Result Date: 05/14/2019 CLINICAL DATA:  86 year old female with nausea and vomiting. EXAM: CT ABDOMEN AND PELVIS WITHOUT CONTRAST TECHNIQUE: Multidetector CT imaging of the abdomen and pelvis was performed following the standard protocol without IV contrast. COMPARISON:  None. FINDINGS: Evaluation of this exam is limited in the absence of intravenous contrast. Lower chest: The visualized lung bases are clear. No intra-abdominal free air or free fluid. Hepatobiliary: The liver is unremarkable. No intrahepatic biliary ductal dilatation. Multiple small stones noted within the gallbladder. The gallbladder is mildly distended. No pericholecystic fluid. Ultrasound may provide better evaluation of the gallbladder if there is clinical concern for acute cholecystitis. Pancreas: Unremarkable. No pancreatic ductal dilatation or surrounding inflammatory changes. Spleen: Normal in size without focal abnormality. Adrenals/Urinary Tract: The adrenal glands are unremarkable.  There is no hydronephrosis or nephrolithiasis on either side. The visualized ureters appear unremarkable. The urinary bladder is suboptimally visualized due to streak artifact caused by bilateral hip arthroplasties. Stomach/Bowel: There is sigmoid diverticulosis without active inflammatory changes. There is diffuse inflammatory changes and thickening of the descending colon consistent with colitis. This is likely inflammatory or infectious in etiology. Ischemia is less likely but not excluded. There is no pneumatosis. Correlation with clinical exam  and stool cultures recommended. There is no bowel obstruction. The appendix is normal. Vascular/Lymphatic: Advanced aortoiliac atherosclerotic disease. The IVC is grossly unremarkable. No portal venous gas. There is no adenopathy. Reproductive: The uterus is grossly unremarkable. There is a 2.5 x 2.5 cm high attenuating structure associated with the right ovary which is not characterized on this CT. This may represent normal ovarian tissue or an ovarian lesion. Evaluation of the pelvic structures is very limited due to streak artifact caused by bilateral metallic hip arthroplasties. Further evaluation with ultrasound on a nonemergent/outpatient basis recommended. Other: None Musculoskeletal: Osteopenia with degenerative changes of the spine. Bilateral hip arthroplasties. No acute osseous pathology. IMPRESSION: 1. Colitis involving the descending colon, likely inflammatory/infectious in etiology. Correlation with clinical exam and stool cultures recommended. No bowel obstruction. Normal appendix. 2. Sigmoid diverticulosis. 3. Cholelithiasis. 4. Advanced Aortic Atherosclerosis (ICD10-I70.0). Electronically Signed   By: Anner Crete M.D.   On: 05/14/2019 22:25   DG Chest Port 1 View  Result Date: 05/14/2019 CLINICAL DATA:  86 year old female with nonspecific chest pain. EXAM: PORTABLE CHEST 1 VIEW COMPARISON:  None. FINDINGS: There is no focal consolidation, pleural  effusion or pneumothorax. The cardiac silhouette is within normal limits. Small left hilar calcified granuloma. Atherosclerotic calcification of the aorta. No acute osseous pathology. IMPRESSION: No active disease. Electronically Signed   By: Anner Crete M.D.   On: 05/14/2019 21:17   CT HEAD CODE STROKE WO CONTRAST  Result Date: 05/14/2019 CLINICAL DATA:  Code stroke.  Altered mental status EXAM: CT HEAD WITHOUT CONTRAST TECHNIQUE: Contiguous axial images were obtained from the base of the skull through the vertex without intravenous contrast. COMPARISON:  None. FINDINGS: Brain: Generalized atrophy, moderate in degree. Moderate hypodensity throughout the cerebral white matter extending into the left internal capsule compatible chronic ischemia. Negative for acute hemorrhage or mass. No midline shift. Vascular: Atherosclerotic calcification bilaterally. Negative for hyperdense vessel Skull: Negative Sinuses/Orbits: Complete opacification left maxillary sinus with mucosal edema extending into the left frontal and ethmoid sinus. No air-fluid levels. Left maxillary sinus secretions are mildly hyperdense. Negative for orbital mass.  Bilateral cataract extraction Other: None ASPECTS ([place] Stroke Program Early CT Score) - Ganglionic level infarction (caudate, lentiform nuclei, internal capsule, insula, M1-M3 cortex): 7 - Supraganglionic infarction (M4-M6 cortex): 3 Total score (0-10 with 10 being normal): 10 IMPRESSION: 1. No acute abnormality 2. ASPECTS is 10 3. Moderate atrophy and moderate chronic microvascular ischemic change 4. These results were called by telephone at the time of interpretation on 05/14/2019 at 8:27 pm to provider Lindzen , who verbally acknowledged these results. Electronically Signed   By: Franchot Gallo M.D.   On: 05/14/2019 20:28    EKG: Independently reviewed.  Sinus rhythm, PACs.  Probable anteroseptal infarct, old.  Assessment/Plan Principal Problem:   AMS (altered mental  status) Active Problems:   AKI (acute kidney injury) ([place])   Hyponatremia   Colitis   Generalized weakness   AMS/ ?aphasia: Likely toxic/metabolic or infectious given colitis on abdominal CT.  Seen by neurology and there is also concern for possible left perisylvian stroke versus seizure.  Sedation from Neurontin could also possibly playing a role.  Head CT negative for acute intracranial abnormality.  Speech currently fluent.  B12 level normal.  Ammonia level normal.  ESR pending.  UDS pending.  TSH borderline elevated at 4.98. Plan: Continue aspirin and pravastatin.  Decrease dose of home Neurontin from 600 mg twice daily to 300 mg twice daily as recommended by neurology.  Additional  work-up including brain MRI, MRA head, transthoracic echo, carotid Dopplers, and EEG.  Permissive hypertension x24 hours.  PT/OT/SLP eval.  Frequent neurochecks.  Management of colitis as mentioned below.  Acute infectious versus inflammatory colitis involving the descending colon: Seen on CT.  Patient with complaints of nausea, vomiting, and abdominal pain. Leukocytosis on labs but no other signs of sepsis.   Plan: Start ceftriaxone and Flagyl.  Morphine as needed for pain.  IV fluid hydration.  Check C. difficile PCR and GI pathogen panel.  Contact precautions.  Continue to monitor WBC count.  Blood culture x2 pending.  Hyponatremia: Sodium 130.  Likely secondary to decreased p.o. intake/GI losses.  Plan: IV fluid hydration and continue to monitor sodium level.  AKI: Likely secondary to dehydration/GI losses. BUN 48, creatinine 2.9.  Baseline creatinine 1.0-1.1.  Plan: IV fluid hydration.  Continue to monitor renal function and urine output.  Avoid nephrotoxic agents/contrast.  Check CK level given patient had a fall and was presumably on the floor for a prolonged period of time.  Order renal ultrasound.  Abnormal thyroid function study: TSH borderline elevated at 4.98. Plan: Check free T4 level.  Generalized  weakness/fall: Fall precautions.  PT and OT evaluation.  Non-insulin-dependent diabetes mellitus: Check A1c.  Sliding scale insulin sensitive ACHS and CBG checks.  DVT prophylaxis: Subcutaneous heparin Code Status: Full code Family Communication: No family available at this time. Disposition Plan: Anticipate discharge after clinical improvement. Consults called: Neurology Admission status: It is my clinical opinion that admission to [facility] is reasonable and necessary in this 86 y.o. female . presenting with altered mental status/aphasia, acute colitis, and AKI.  Work-up mentioned above.  Given the aforementioned, the predictability of an adverse outcome is felt to be significant. I expect that the patient will require at least 2 midnights in the hospital to treat this condition.   The medical decision making on this patient was of high complexity and the patient is at high risk for clinical deterioration, therefore this is a level 3 visit.  Shela Leff MD [facility]  If 7PM-7AM, please contact night-coverage [email or web address] Password [email or web address]  05/15/2019, 1:02 AM

## 2019-05-15 NOTE — Evaluation (Addendum)
Speech Language Pathology Evaluation Patient Details Name: Madison Martinez MRN: [id number] DOB: 29-Apr-1927 Today's Date: 05/15/2019 Time: [id number] SLP Time Calculation (min) (ACUTE ONLY): 21 min  Problem List:  Patient Active Problem List   Diagnosis Date Noted  . AMS (altered mental status) 05/15/2019  . Colitis 05/15/2019  . Generalized weakness 05/15/2019  . Skin cancer 12/14/2018  . Changing skin lesion 11/23/2018  . Mild cognitive impairment 08/06/2018  . Right lumbar radiculopathy 09/25/2017  . At moderate risk for fall 09/25/2017  . Use of cane as ambulatory aid 12/16/2016  . Chronic kidney disease, stage 3 ([place]) 12/16/2016  . Hyperlipidemia   . Osteoporosis   . Polyneuropathy   . Spinal stenosis   . Hard of hearing 12/02/2015  . Monoclonal B-cell lymphocytosis 11/07/2013  . Blind hypertensive eye 04/18/2012  . Diabetes mellitus, type II ([place]) 03/06/2012  . AKI (acute kidney injury) ([place]) 03/06/2012  . Essential hypertension 03/06/2012  . Hyponatremia 03/06/2012  . Branch retinal vein occlusion 01/06/2012  . Primary open angle glaucoma 01/06/2012  . Pseudophakia 01/06/2012  . Stable branch retinal vein occlusion 01/06/2012   Past Medical History:  Past Medical History:  Diagnosis Date  . Blind hypertensive eye, left 04/18/2012  . Gait disturbance 10/05/2015   pt declined neuro referral per records  . Hard of hearing 12/02/2015   hearing aids  . Hyperlipidemia   . Hypertension   . Insomnia    "tried melatonin, advil PM, xanax and klonopin without success"  . Leukocytosis 11/07/2013   seen oncology, did not want to continue to follow there, so continued follow up at PCP.  . Osteoporosis   . Polyneuropathy   . Primary open angle glaucoma 01/2011  . Spinal stenosis   . Stable branch retinal vein occlusion 01/06/2012  . Type 2 diabetes mellitus ([place])    "diet controlled"   Past Surgical History:  Past Surgical History:  Procedure Laterality Date  . EYE  SURGERY    . [place]  2005  . TOTAL HIP ARTHROPLASTY Left 1991  . TOTAL HIP ARTHROPLASTY Right ?   x 2   HPI:  Patient is an 86 year old female admitted from home with AMS. Fall prior to admission. MRI reveals 2 subcentimeter acute ashemic cortical infarcts. PMH includes: Blind in L eye, HOH, HTN, HLD, DM   Assessment / Plan / Recommendation Clinical Impression   Patient seen at bedside for speech/languge assessment. Patient oriented x3, when asked why she is in the hospital, she stated "I have been sick."  Patient is very hard of hearing and had difficulty hearing during this assessment despite having bilateral hearing aids (both working) in place. Patient presents with receptive and expressive language skills that are grossly [facility]. She is able to engage fluently in conversation, answer basic and complex yes/no questions, follow 1-3 step verbal commands. No evidence of dysarthria. Speech is clear and intelligible. Patient able to name 10/10 items in confrontation naming task and denies word-finding difficulty. Patient able to recall 2/4 items in delayed verbal recall task. She attributed this to feeling very tired, stating "I have barely slept." Based on informal assessment, safety awareness and attention appear to be intact. She reports she lives with her son and states he is retired and "home all the time."  Patient may benefit from follow-up ST at the next venue of care to further assess cognitive-linguistic needs and recall skills. No further ST recommended at the acute care level.      SLP  Assessment  SLP Recommendation/Assessment: All further Speech Lanaguage Pathology  needs can be addressed in the next venue of care SLP Visit Diagnosis: Cognitive communication deficit (R41.841)    Follow Up Recommendations  Home health SLP   May benefit from Audiology follow-up   Frequency and Duration           SLP Evaluation Cognition  Overall Cognitive Status: No family/caregiver  present to determine baseline cognitive functioning Arousal/Alertness: Awake/alert Orientation Level: Oriented to person;Oriented to place;Oriented to time;Disoriented to situation Memory: Impaired Memory Impairment: Decreased short term memory Awareness: Appears intact Problem Solving: Appears intact Safety/Judgment: Appears intact       Comprehension  Auditory Comprehension Overall Auditory Comprehension: Appears within functional limits for tasks assessed Interfering Components: Hearing Reading Comprehension Reading Status: Not tested    Expression Expression Primary Mode of Expression: Verbal Verbal Expression Overall Verbal Expression: Appears within functional limits for tasks assessed Written Expression Written Expression: Not tested   Oral / Motor  Oral Motor/Sensory Function Overall Oral Motor/Sensory Function: Within functional limits Motor Speech Overall Motor Speech: Appears within functional limits for tasks assessed   GO                    Madison Martinez 05/15/2019, 4:32 PM  Marina Goodell, M.Ed., CCC-SLP Speech Therapy Acute Rehabilitation [phone]: Acute Rehab office [phone] - pager

## 2019-05-15 NOTE — Telephone Encounter (Signed)
Pt is currently admitted. FYI. Please see below.   Patient Name: Madison Martinez Gender: Female DOB: May 18, 1927 Age: 86 Y 74 M 8 D Return Phone Number: [phone] (Primary) Address: City/State/Zip: [place] [address] Client [facility] - Client Client Site [facility] Physician Raoul Pitch, [place] Contact Type Call Who Is Calling Patient / Member / Family / Caregiver Call Type Triage / Clinical Caller Name Max Lilia Pro Relationship To Patient Son Return Phone Number [phone] (Primary) Chief Complaint CONFUSION - new onset Reason for Call Symptomatic / Request for [place] states that mother is being incoherent, looking around for something, needs some advice. Just started. Yesterday fell but does not think it was a head injury. Wants to know what to do? Translation No Nurse Assessment Nurse: Sharlett Iles, RN, Nehemiah Settle Date/Time (Eastern Time): 05/14/2019 6:29:31 PM Confirm and document reason for call. If symptomatic, describe symptoms. ---States his mother fell yesterday and today she is incoherent. She does recognize her son and where she is. She's wandering around the house looking for something. Has the patient had close contact with a person known or suspected to have the novel coronavirus illness OR traveled / lives in area with major community spread (including international travel) in the last 14 days from the onset of symptoms? * If Asymptomatic, screen for exposure and travel within the last 14 days. ---No Does the patient have any new or worsening symptoms? ---Yes Will a triage be completed? ---Yes Related visit to physician within the last 2 weeks? ---No Does the PT have any chronic conditions? (i.e. diabetes, asthma, this includes High risk factors for pregnancy, etc.) ---Yes List chronic conditions. ---High blood pressure, pre-diabetic Is this a behavioral health or substance abuse call?  ---No Guidelines Guideline Title Affirmed Question Affirmed Notes Nurse Date/Time (Eastern Time) Confusion - Delirium [1] Acting confused (e.g., disoriented, slurred speech) AND [2] brief (now gone) Sharlett Iles, Therapist, sports, [facility] 05/14/2019 6:31:56 PM Disp. Time Eilene Ghazi Time) Disposition Final User 05/14/2019 6:27:26 PM Send to Urgent Queue Lawernce Keas [id number] 6:39:28 PM Called On-Call Provider Sharlett Iles, RN, Nehemiah Settle 05/14/2019 6:36:29 PM See HCP within 4 Hours (or PCP triage) Yes Sharlett Iles, RN, Lewayne Bunting Disagree/Comply Comply Caller Understands Yes PreDisposition Call Doctor Care Advice Given Per Guideline SEE HCP WITHIN 4 HOURS (OR PCP TRIAGE): * IF OFFICE WILL BE CLOSED AND PCP SECOND-LEVEL TRIAGE REQUIRED: You may need to be seen. Your doctor (or NP/PA) will want to talk with you to decide what's best. I'll page the oncall provider now. If you haven't heard from the provider (or me) within 30 minutes, call again. NOTE: If on-call provider can't be reached, send to [facility] or ED. CALL BACK IF: * You become worse. Referrals GO TO FACILITY UNDECIDED Paging DoctorName Phone DateTime Result/Outcome Message Type Notes Billey Gosling - MD [id number] 05/14/2019 6:39:28 PM Called On Call Provider - Reached Doctor Paged Billey Gosling - MD 05/14/2019 6:39:44 PM Spoke with On Call - General Message Result Patient will need to be evaluated in the ED

## 2019-05-15 NOTE — Progress Notes (Signed)
Lower extremity venous has been completed.   Preliminary results in CV Proc.   Abram Sander 05/15/2019 11:13 AM

## 2019-05-15 NOTE — Evaluation (Signed)
Occupational Therapy Evaluation Patient Details Name: Madison Martinez MRN: [id number] DOB: 12/13/1927 Today's Date: 05/15/2019    History of Present Illness Patient is an 86 year old female admitted from home with AMS. Fall prior to admission. MRI reveals 2 subcentimeter acute ashemic cortical infarcts. PMH includes: Blind in L eye, HOH, HTN, HLD, DM   Clinical Impression   This 86 y/o female presents with the above. PTA pt reports she was mod independent with ADL and functional mobility. Pt currently presenting with generalized weakness, decreased endurance and poor standing balance. Pt overall requiring minA for functional mobility using RW, requiring minguard assist for seated UB ADL, min-modA for LB ADL. Pt fatigued with completion of mobility tasks. She lives with her son who typically assists with some iADL tasks. Pt will benefit from continued acute OT services, currently recommend follow up [place] services to maximize her safety and independence with ADL and mobility. Will follow.     Follow Up Recommendations  Home health OT;Supervision/Assistance - 24 hour    Equipment Recommendations  None recommended by OT           Precautions / Restrictions Precautions Precautions: Fall Restrictions Weight Bearing Restrictions: No      Mobility Bed Mobility Overal bed mobility: Needs Assistance Bed Mobility: Supine to Sit;Sit to Supine     Supine to sit: Min assist Sit to supine: Min guard   General bed mobility comments: assist for trunk elevation  Transfers Overall transfer level: Needs assistance Equipment used: Rolling walker (2 wheeled) Transfers: Sit to/from Stand Sit to Stand: Min assist         General transfer comment: unsteadiness with initial standing balance, posterior lean.    Balance Overall balance assessment: Needs assistance Sitting-balance support: Feet supported Sitting balance-Leahy Scale: Fair     Standing balance support: Bilateral upper extremity  supported;During functional activity Standing balance-Leahy Scale: Poor Standing balance comment: reliant on RW and external assist for safety                           ADL either performed or assessed with clinical judgement   ADL Overall ADL's : Needs assistance/impaired Eating/Feeding: Set up;Sitting   Grooming: Min guard;Set up;Sitting Grooming Details (indicate cue type and reason): pt too fatigued to attempt to perform in standing Upper Body Bathing: Minimal assistance;Sitting   Lower Body Bathing: Moderate assistance;Sit to/from stand   Upper Body Dressing : Set up;Min guard;Sitting   Lower Body Dressing: Moderate assistance;Sit to/from stand Lower Body Dressing Details (indicate cue type and reason): minA standing balance Toilet Transfer: Minimal assistance;Ambulation;RW   Toileting- Clothing Manipulation and Hygiene: Minimal assistance;Sit to/from stand       Functional mobility during ADLs: Minimal assistance;Rolling walker General ADL Comments: pt fatigued this session, tolerating functional mobility with RW but too fatigued to attempt standing ADL post mobility                         Pertinent Vitals/Pain Pain Assessment: Faces Faces Pain Scale: Hurts little more Pain Location: B feet Pain Descriptors / Indicators: Sore;Discomfort Pain Intervention(s): Limited activity within patient's tolerance;Monitored during session;Repositioned     Hand Dominance     Extremity/Trunk Assessment Upper Extremity Assessment Upper Extremity Assessment: Generalized weakness   Lower Extremity Assessment Lower Extremity Assessment: Defer to PT evaluation   Cervical / Trunk Assessment Cervical / Trunk Assessment: Normal   Communication Communication Communication: HOH   Cognition  Arousal/Alertness: Awake/alert Behavior During Therapy: WFL for tasks assessed/performed Overall Cognitive Status: No family/caregiver present to determine baseline cognitive  functioning                                 General Comments: WFL for basic tasks, requires some repetition however pt also [facility]   General Comments       Exercises     Shoulder Instructions      Home Living Family/patient expects to be discharged to:: Private residence Living Arrangements: Children Available Help at Discharge: Family;Available 24 hours/[place] Type of Home: House Home Access: Stairs to enter [employer] of Steps: 5?  son reports she does not go out often and if she does he is there with her.   Home Layout: Able to live on main level with bedroom/bathroom;Two level     Bathroom Shower/Tub: Occupational psychologist: Handicapped height     Home Equipment: Environmental consultant - 2 wheels;Shower seat      Lives With: Son    Prior Functioning/Environment Level of Independence: Independent with assistive device(s)                 OT Problem List: Decreased strength;Decreased range of motion;Decreased activity tolerance;Impaired balance (sitting and/or standing);Decreased cognition;Decreased knowledge of use of DME or AE      OT Treatment/Interventions: Self-care/ADL training;Therapeutic exercise;Energy conservation;DME and/or AE instruction;Therapeutic activities;Cognitive remediation/compensation;Patient/family education;Balance training    OT Goals(Current goals can be found in the care plan section) Acute Rehab OT Goals Patient Stated Goal: to return home, feel better OT Goal Formulation: With patient Time For Goal Achievement: 05/29/19 Potential to Achieve Goals: Good  OT Frequency: Min 2X/week   Barriers to D/C:            Co-evaluation PT/OT/SLP Co-Evaluation/Treatment: Yes Reason for Co-Treatment: For patient/therapist safety;To address functional/ADL transfers   OT goals addressed during session: ADL's and self-care      AM-PAC OT "6 Clicks" Daily Activity     Outcome Measure Help from another person eating meals?:  None Help from another person taking care of personal grooming?: A Little Help from another person toileting, which includes using toliet, bedpan, or urinal?: A Little Help from another person bathing (including washing, rinsing, drying)?: A Little Help from another person to put on and taking off regular upper body clothing?: A Little Help from another person to put on and taking off regular lower body clothing?: A Little 6 Click Score: 19   End of Session Equipment Utilized During Treatment: Rolling walker;Gait belt Nurse Communication: Mobility status  Activity Tolerance: Patient tolerated treatment well;Patient limited by fatigue Patient left: in bed;with call bell/phone within reach;with bed alarm set  OT Visit Diagnosis: Muscle weakness (generalized) (M62.81);Unsteadiness on feet (R26.81)                Time: [id number] OT Time Calculation (min): 15 min Charges:  OT General Charges $OT Visit: 1 Visit OT Evaluation $OT Eval Moderate Complexity: 1 Mod  Madison Martinez, OT [employer] Pager [phone] Office [phone]   Raymondo Band 05/15/2019, 4:47 PM

## 2019-05-15 NOTE — Progress Notes (Signed)
PROGRESS NOTE    Madison Martinez  [id number] DOB: 1927/06/02 DOA: 05/14/2019 PCP: Ma Hillock, DO    Brief Narrative:  Patient was admitted to the hospital with a working diagnosis of toxic/metabolic encephalopathy, in the setting of acute colitis, rule out CVA.   86 year old female who presented with dysphagia and altered mental status.  She does have significant past medical history for left eye blindness, hypoacusia, ambulatory dysfunction, hypertension, dyslipidemia, type 2 diabetes mellitus, spinal stenosis and polyneuropathy.  At baseline patient independent. 02/23 patient was found confused, unable to follow commands, positive aphasia.  On her initial physical examination blood pressure 114/54, heart rate 80, respiratory rate 23, oxygen saturation 92%, her lungs were clear to auscultation bilaterally, heart S1-S2 present rhythmic, abdomen soft, mild tender to palpation, no lower extremity edema.  Patient was awake and alert, she was disoriented, but nonfocal. Sodium 130, potassium 4.1, chloride 96, bicarb 22, glucose 96, BUN 48, creatinine 2.96, white count 23.1, hemoglobin 12.4, hematocrit 37.3, platelets 198.  SARS COVID-19 negative.  EKG 63 bpm, normal axis, right bundle branch block, sinus rhythm, PACs, no ST segment or T wave changes.  CT head with positive atrophy, no acute changes.  CT of the abdomen positive for colitis involving the descending colon, sigmoid diverticulosis.  Cholelithiasis.  Patient has been placed on IV antibiotic therapy, intravenous fluids and placed on stroke workup protocol.   Assessment & Plan:   Principal Problem:   AMS (altered mental status) Active Problems:   AKI (acute kidney injury) ([place])   Hyponatremia   Colitis   Generalized weakness   1. Acute metabolic/ toxic encephalopathy. Patient is more awake and alert, no focal deficit. Likely change in mentation due to colitis. Will continue supportive medical care, neuro checks and follow up on  neuro workup with carotid US, EEG, brain MRA and MRI and echocardiogram. Follow with speech, physical, occupational therapy and neurology recommendations.   Gabapentin dose has been reduced.   2. Acute colitis. Patient is tolerating po well, this am, will continue with IV antibiotic therapy with ceftriaxone and metronidazole. As needed antiemetics and analgesics. Follow on stool panel.   3. AKI on CKD stage 3a with hyponatremia. Na at 137 with improved renal function, serum cr down to 1,92  4. Controlled T2DM (HgA1c 5,5)/ dyslipidemia. Patient tolerating po well, glucose has been controlled, will hold on insulin coverage for now, check fasting glucose in am.   Continue with pravastatin.   5. HTN. Continue metoprolol for blood pressure control.     DVT prophylaxis: enoxaparin   Code Status:  full Family Communication: I spoke with patient's son at the bedside, we talked in detail about patient's condition, plan of care and prognosis and all questions were addressed.  Disposition Plan/ discharge barriers: patient continue acutely ill.    Consultants:   Neurology   Procedures:      Antimicrobials:    ceftriaxone and metronidazole.     Subjective: Patient is feeling better, her son is at the bedside, she is close to her baseline but continue to have abdominal pain, no nausea or vomiting, no chest pain or dyspnea.   Objective: Vitals:   05/15/19 0030 05/15/19 0039 05/15/19 0239 05/15/19 0924  BP: (!) 109/45  124/73 (!) 138/52  Pulse: (!) 51 78 67 68  Resp:  19 17 18   Temp:   99.3 F (37.4 C) 98.4 F (36.9 C)  TempSrc:   Oral Oral  SpO2: 92% 95% 96% 94%  Weight:  Height:        Intake/Output Summary (Last 24 hours) at 05/15/2019 1008 Last data filed at 05/15/2019 [id number] Gross per 24 hour  Intake 1379.62 ml  Output 500 ml  Net 879.62 ml   Filed Weights   05/14/19 2045  Weight: 65.2 kg    Examination:   General: Not in pain or dyspnea, deconditioned    Neurology: Awake and alert, non focal  E ENT: mild pallor, no icterus, oral mucosa moist Cardiovascular: No JVD. S1-S2 present, rhythmic, no gallops, rubs, or murmurs. No lower extremity edema. Pulmonary: positive breath sounds bilaterally, adequate air movement, no wheezing, rhonchi or rales. Gastrointestinal. Abdomen with no organomegaly, no rebound or guarding, mild tender to deep palpation.  Skin. No rashes Musculoskeletal: no joint deformities     Data Reviewed: I have personally reviewed following labs and imaging studies  CBC: Recent Labs  Lab 05/14/19 2012 05/14/19 2021  WBC 23.1*  --   NEUTROABS 14.8*  --   HGB 12.4 12.2  HCT 37.3 36.0  MCV 95.2  --   PLT 198  --    Basic Metabolic Panel: Recent Labs  Lab 05/14/19 2012 05/14/19 2021  NA 130* 130*  K 4.1 4.1  CL 96* 96*  CO2 22  --   GLUCOSE 96 92  BUN 48* 45*  CREATININE 2.96* 3.10*  CALCIUM 9.2  --    GFR: Estimated Creatinine Clearance: 10.9 mL/min (A) (by C-G formula based on SCr of 3.1 mg/dL (H)). Liver Function Tests: Recent Labs  Lab 05/14/19 2012  AST 30  ALT 16  ALKPHOS 68  BILITOT 0.6  PROT 6.6  ALBUMIN 3.4*   No results for input(s): LIPASE, AMYLASE in the last 168 hours. Recent Labs  Lab 05/14/19 2325  AMMONIA 18   Coagulation Profile: Recent Labs  Lab 05/14/19 2012  INR 1.1   Cardiac Enzymes: No results for input(s): CKTOTAL, CKMB, CKMBINDEX, TROPONINI in the last 168 hours. BNP (last 3 results) No results for input(s): PROBNP in the last 8760 hours. HbA1C: No results for input(s): HGBA1C in the last 72 hours. CBG: Recent Labs  Lab 05/14/19 2011 05/15/19 0932  GLUCAP 91 87   Lipid Profile: No results for input(s): CHOL, HDL, LDLCALC, TRIG, CHOLHDL, LDLDIRECT in the last 72 hours. Thyroid Function Tests: Recent Labs    05/14/19 2305  TSH 4.982*   Anemia Panel: Recent Labs    05/14/19 2305  [id number]      Radiology Studies: I have reviewed all of  the imaging during this hospital visit personally     Scheduled Meds: . aspirin EC  325 mg Oral Daily   Or  . aspirin  300 mg Rectal Daily  . cholecalciferol  1,000 Units Oral Daily  . dorzolamide-timolol  1 drop Left Eye BID  . gabapentin  300 mg Oral BID  . heparin  5,000 Units Subcutaneous Q8H  . insulin aspart  0-5 Units Subcutaneous QHS  . insulin aspart  0-9 Units Subcutaneous TID WC  . [START ON 05/16/2019] latanoprost  1 drop Left Eye QHS  . multivitamin with minerals  1 tablet Oral Daily  . pravastatin  10 mg Oral q1800   Continuous Infusions: . sodium chloride 125 mL/hr at 05/15/19 0300  . [START ON 05/16/2019] cefTRIAXone (ROCEPHIN)  IV    . metronidazole 500 mg (05/15/19 0950)     LOS: 0 days        Daisy Mcneel Gerome Apley, MD

## 2019-05-15 NOTE — ED Notes (Signed)
Pt transported to MRI 

## 2019-05-15 NOTE — Progress Notes (Signed)
STROKE TEAM PROGRESS NOTE   INTERVAL HISTORY Son at bedside.  Patient lying in bed, hard of hearing, however, awake alert orientated, much improved from yesterday.  Still has AKI on CKD, on IV fluid at 125 cc/h.  EEG showed left frontal temporal intermittent slowing, no seizure.  BP improved on IV fluid.  Passed a swallow, will put on DAPT and increase statin.  Vitals:   05/15/19 0039 05/15/19 0239 05/15/19 0924 05/15/19 1224  BP:  124/73 (!) 138/52 (!) (P) 152/65  Pulse: 78 67 68 (P) 74  Resp: 19 17 18  (P) 18  Temp:  99.3 F (37.4 C) 98.4 F (36.9 C) (P) 97.6 F (36.4 C)  TempSrc:  Oral Oral (P) Oral  SpO2: 95% 96% 94% (P) 99%  Weight:      Height:        CBC:  Recent Labs  Lab 05/14/19 2012 05/14/19 2021  WBC 23.1*  --   NEUTROABS 14.8*  --   HGB 12.4 12.2  HCT 37.3 36.0  MCV 95.2  --   PLT 198  --     Basic Metabolic Panel:  Recent Labs  Lab 05/14/19 2012 05/14/19 2012 05/14/19 2021 05/15/19 1002  NA 130*   < > 130* 137  K 4.1   < > 4.1 3.7  CL 96*   < > 96* 105  CO2 22  --   --  22  GLUCOSE 96   < > 92 129*  BUN 48*   < > 45* 36*  CREATININE 2.96*   < > 3.10* 1.92*  CALCIUM 9.2  --   --  9.0   < > = values in this interval not displayed.   Lipid Panel:     Component Value Date/Time   CHOL 190 05/15/2019 1002   TRIG 276 (H) 05/15/2019 1002   HDL 31 (L) 05/15/2019 1002   CHOLHDL 6.1 05/15/2019 1002   VLDL 55 (H) 05/15/2019 1002   LDLCALC 104 (H) 05/15/2019 1002   HgbA1c:  Lab Results  Component Value Date   HGBA1C 5.5 05/15/2019   Urine Drug Screen: No results found for: LABOPIA, COCAINSCRNUR, LABBENZ, AMPHETMU, THCU, LABBARB  Alcohol Level     Component Value Date/Time   ETH <10 05/14/2019 2012    IMAGING past 48 hours CT Abdomen Pelvis Wo Contrast  Result Date: 05/14/2019 CLINICAL DATA:  86 year old female with nausea and vomiting. EXAM: CT ABDOMEN AND PELVIS WITHOUT CONTRAST TECHNIQUE: Multidetector CT imaging of the abdomen and pelvis  was performed following the standard protocol without IV contrast. COMPARISON:  None. FINDINGS: Evaluation of this exam is limited in the absence of intravenous contrast. Lower chest: The visualized lung bases are clear. No intra-abdominal free air or free fluid. Hepatobiliary: The liver is unremarkable. No intrahepatic biliary ductal dilatation. Multiple small stones noted within the gallbladder. The gallbladder is mildly distended. No pericholecystic fluid. Ultrasound may provide better evaluation of the gallbladder if there is clinical concern for acute cholecystitis. Pancreas: Unremarkable. No pancreatic ductal dilatation or surrounding inflammatory changes. Spleen: Normal in size without focal abnormality. Adrenals/Urinary Tract: The adrenal glands are unremarkable. There is no hydronephrosis or nephrolithiasis on either side. The visualized ureters appear unremarkable. The urinary bladder is suboptimally visualized due to streak artifact caused by bilateral hip arthroplasties. Stomach/Bowel: There is sigmoid diverticulosis without active inflammatory changes. There is diffuse inflammatory changes and thickening of the descending colon consistent with colitis. This is likely inflammatory or infectious in etiology. Ischemia is less likely  but not excluded. There is no pneumatosis. Correlation with clinical exam and stool cultures recommended. There is no bowel obstruction. The appendix is normal. Vascular/Lymphatic: Advanced aortoiliac atherosclerotic disease. The IVC is grossly unremarkable. No portal venous gas. There is no adenopathy. Reproductive: The uterus is grossly unremarkable. There is a 2.5 x 2.5 cm high attenuating structure associated with the right ovary which is not characterized on this CT. This may represent normal ovarian tissue or an ovarian lesion. Evaluation of the pelvic structures is very limited due to streak artifact caused by bilateral metallic hip arthroplasties. Further evaluation  with ultrasound on a nonemergent/outpatient basis recommended. Other: None Musculoskeletal: Osteopenia with degenerative changes of the spine. Bilateral hip arthroplasties. No acute osseous pathology. IMPRESSION: 1. Colitis involving the descending colon, likely inflammatory/infectious in etiology. Correlation with clinical exam and stool cultures recommended. No bowel obstruction. Normal appendix. 2. Sigmoid diverticulosis. 3. Cholelithiasis. 4. Advanced Aortic Atherosclerosis (ICD10-I70.0). Electronically Signed   By: Anner Crete M.D.   On: 05/14/2019 22:25   MR ANGIO HEAD WO CONTRAST  Result Date: 05/15/2019 CLINICAL DATA:  Initial evaluation for acute altered mental status. EXAM: MRI HEAD WITHOUT CONTRAST MRA HEAD WITHOUT CONTRAST TECHNIQUE: Multiplanar, multiecho pulse sequences of the brain and surrounding structures were obtained without intravenous contrast. Angiographic images of the head were obtained using MRA technique without contrast. COMPARISON:  Prior CT from 05/14/2019. FINDINGS: MRI HEAD FINDINGS Brain: Moderately advanced cerebral atrophy. Patchy and confluent T2/FLAIR hyperintensity within the periventricular deep white matter both cerebral hemispheres most consistent with chronic small vessel ischemic disease, moderate to advanced in nature. Superimposed remote lacunar infarcts noted involving the bilateral basal ganglia. Additional small remote lacune noted at the central pons. Tiny remote left cerebellar infarct. Punctate 4 mm focus of restricted diffusion seen involving the cortex of the posterior left frontoparietal region (series 5, image 77). Additional tiny punctate 4-5 mm focus of restricted diffusion seen involving the cortical gray matter of the posterior left parietal lobe (series 5, image 83). Findings consistent with tiny acute ischemic infarcts, likely embolic. No associated hemorrhage or mass effect. No other evidence for acute or subacute ischemia. Gray-white matter  differentiation otherwise maintained. No foci of susceptibility artifact to suggest acute or chronic intracranial hemorrhage. No mass lesion, midline shift or mass effect. Diffuse ventricular prominence related to global parenchymal volume loss without hydrocephalus. No extra-axial fluid collection. Pituitary gland within normal limits. Vascular: Absent flow void within the hypoplastic left vertebral artery, likely occluded. Major intracranial vascular flow voids otherwise maintained. Skull and upper cervical spine: Craniocervical junction within normal limits. Bone marrow signal intensity normal. No scalp soft tissue abnormality. Sinuses/Orbits: Patient status post bilateral ocular lens replacement. Opacification of the left frontoethmoidal sinuses, with moderate mucosal thickening within the left maxillary sinus. Paranasal sinuses are otherwise clear. Trace bilateral mastoid effusions, of doubtful significance. Inner ear structures grossly normal. Other: None. MRA HEAD FINDINGS ANTERIOR CIRCULATION: Distal cervical segments of the internal carotid arteries are patent with symmetric antegrade flow. Petrous segments widely patent bilaterally. Scattered calcified plaque throughout the cavernous/supraclinoid ICAs bilaterally. Associated moderate diffuse narrowing throughout the cavernous left ICA. Associated moderate to severe multifocal narrowing at the anterior genu of the cavernous right ICA. 5 mm focal outpouching extending laterally and slightly posteriorly from the anterior genu of the cavernous right ICA consistent with aneurysm (series 7, image 82). A1 segments patent bilaterally. Normal anterior communicating artery. A2 segments widely patent proximally. Short-segment moderate to severe distal bilateral A2 stenoses noted. There is a focal  severe fairly long segment stenosis extending from the left carotid terminus into the mid left M1 segment with loss of normal flow related signal. Stenosis measures  approximately 8 mm in length. Left M1 segment patent distally. Normal left MCA bifurcation. Left MCA branches well perfused distally. Right M1 irregular but widely patent. Normal right MCA bifurcation. Distal right MCA branches well perfused. POSTERIOR CIRCULATION: Dominant right vertebral artery with hypoplastic left vertebral artery. Faint attenuated flow seen within the left vertebral artery at the skull base, and remains grossly patent to the level of the left PICA left PICA perfused. Distally, left V4 segment is occluded to the vertebrobasilar junction. Dominant right vertebral artery widely patent at the skull base. Irregular approximate 60% stenosis seen involving the mid right V4 segment. Right PICA patent. Mild multifocal irregularity seen throughout the basilar artery which remains widely patent to its distal aspect. Superior cerebral arteries patent bilaterally. Both PCAs primarily supplied via the basilar. Multifocal atheromatous irregularity without high-grade stenosis seen throughout both PCAs. IMPRESSION: MRI HEAD IMPRESSION: 1. Two punctate subcentimeter acute ischemic nonhemorrhagic cortical infarcts involving the left frontoparietal region as above, likely embolic in nature. 2. Underlying moderately advanced cerebral atrophy with chronic small vessel ischemic disease. 3. Left sided paranasal sinus disease as above. MRA HEAD IMPRESSION: 1. Approximate 8 mm severe stenosis extending from the left carotid terminus into the left M1 segment. Left MCA branches are perfused distally. 2. 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 Short-segment approximate 60% right V4 stenosis as above. 3. Extensive atheromatous change throughout the carotid siphons with associated moderate to advanced multifocal stenoses. 4. 5 mm cavernous right ICA aneurysm as above. Electronically Signed   By: Jeannine Boga M.D.   On: 05/15/2019 02:37   MR BRAIN WO CONTRAST  Result  Date: 05/15/2019 CLINICAL DATA:  Initial evaluation for acute altered mental status. EXAM: MRI HEAD WITHOUT CONTRAST MRA HEAD WITHOUT CONTRAST TECHNIQUE: Multiplanar, multiecho pulse sequences of the brain and surrounding structures were obtained without intravenous contrast. Angiographic images of the head were obtained using MRA technique without contrast. COMPARISON:  Prior CT from 05/14/2019. FINDINGS: MRI HEAD FINDINGS Brain: Moderately advanced cerebral atrophy. Patchy and confluent T2/FLAIR hyperintensity within the periventricular deep white matter both cerebral hemispheres most consistent with chronic small vessel ischemic disease, moderate to advanced in nature. Superimposed remote lacunar infarcts noted involving the bilateral basal ganglia. Additional small remote lacune noted at the central pons. Tiny remote left cerebellar infarct. Punctate 4 mm focus of restricted diffusion seen involving the cortex of the posterior left frontoparietal region (series 5, image 77). Additional tiny punctate 4-5 mm focus of restricted diffusion seen involving the cortical gray matter of the posterior left parietal lobe (series 5, image 83). Findings consistent with tiny acute ischemic infarcts, likely embolic. No associated hemorrhage or mass effect. No other evidence for acute or subacute ischemia. Gray-white matter differentiation otherwise maintained. No foci of susceptibility artifact to suggest acute or chronic intracranial hemorrhage. No mass lesion, midline shift or mass effect. Diffuse ventricular prominence related to global parenchymal volume loss without hydrocephalus. No extra-axial fluid collection. Pituitary gland within normal limits. Vascular: Absent flow void within the hypoplastic left vertebral artery, likely occluded. Major intracranial vascular flow voids otherwise maintained. Skull and upper cervical spine: Craniocervical junction within normal limits. Bone marrow signal intensity normal. No scalp  soft tissue abnormality. Sinuses/Orbits: Patient status post bilateral ocular lens replacement. Opacification of the left frontoethmoidal sinuses, with moderate mucosal  thickening within the left maxillary sinus. Paranasal sinuses are otherwise clear. Trace bilateral mastoid effusions, of doubtful significance. Inner ear structures grossly normal. Other: None. MRA HEAD FINDINGS ANTERIOR CIRCULATION: Distal cervical segments of the internal carotid arteries are patent with symmetric antegrade flow. Petrous segments widely patent bilaterally. Scattered calcified plaque throughout the cavernous/supraclinoid ICAs bilaterally. Associated moderate diffuse narrowing throughout the cavernous left ICA. Associated moderate to severe multifocal narrowing at the anterior genu of the cavernous right ICA. 5 mm focal outpouching extending laterally and slightly posteriorly from the anterior genu of the cavernous right ICA consistent with aneurysm (series 7, image 82). A1 segments patent bilaterally. Normal anterior communicating artery. A2 segments widely patent proximally. Short-segment moderate to severe distal bilateral A2 stenoses noted. There is a focal severe fairly long segment stenosis extending from the left carotid terminus into the mid left M1 segment with loss of normal flow related signal. Stenosis measures approximately 8 mm in length. Left M1 segment patent distally. Normal left MCA bifurcation. Left MCA branches well perfused distally. Right M1 irregular but widely patent. Normal right MCA bifurcation. Distal right MCA branches well perfused. POSTERIOR CIRCULATION: Dominant right vertebral artery with hypoplastic left vertebral artery. Faint attenuated flow seen within the left vertebral artery at the skull base, and remains grossly patent to the level of the left PICA left PICA perfused. Distally, left V4 segment is occluded to the vertebrobasilar junction. Dominant right vertebral artery widely patent at the  skull base. Irregular approximate 60% stenosis seen involving the mid right V4 segment. Right PICA patent. Mild multifocal irregularity seen throughout the basilar artery which remains widely patent to its distal aspect. Superior cerebral arteries patent bilaterally. Both PCAs primarily supplied via the basilar. Multifocal atheromatous irregularity without high-grade stenosis seen throughout both PCAs. IMPRESSION: MRI HEAD IMPRESSION: 1. Two punctate subcentimeter acute ischemic nonhemorrhagic cortical infarcts involving the left frontoparietal region as above, likely embolic in nature. 2. Underlying moderately advanced cerebral atrophy with chronic small vessel ischemic disease. 3. Left sided paranasal sinus disease as above. MRA HEAD IMPRESSION: 1. Approximate 8 mm severe stenosis extending from the left carotid terminus into the left M1 segment. Left MCA branches are perfused distally. 2. 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 Short-segment approximate 60% right V4 stenosis as above. 3. Extensive atheromatous change throughout the carotid siphons with associated moderate to advanced multifocal stenoses. 4. 5 mm cavernous right ICA aneurysm as above. Electronically Signed   By: Jeannine Boga M.D.   On: 05/15/2019 02:37   US RENAL  Result Date: 05/15/2019 CLINICAL DATA:  Acute kidney injury EXAM: RENAL / URINARY TRACT ULTRASOUND COMPLETE COMPARISON:  CT abdomen pelvis 05/14/2019 FINDINGS: Right Kidney: Renal measurements: 8.6 x 5.3 x 3.1 cm = volume: 73.8 mL . Echogenicity within normal limits. No worrisome mass, shadowing calculus or hydronephrosis visualized. Left Kidney: Renal measurements: 9.4 x 5.3 x 3.6 cm = volume: 94 mL. 0.8 x 0.6 x 0.6 cm anechoic cystic lesion in the interpolar left kidney most compatible with simple renal cyst corresponding well to a partially exophytic cyst on CT. No worrisome renal lesion. No shadowing calculus or  hydronephrosis. Bladder: Appears normal for degree of bladder distention. Other: None. IMPRESSION: Anechoic 8 mm likely simple cyst in the left kidney. Otherwise unremarkable renal ultrasound. Electronically Signed   By: Lovena Le M.D.   On: 05/15/2019 03:49   DG Chest Port 1 View  Result Date: 05/14/2019 CLINICAL DATA:  86 year old female  with nonspecific chest pain. EXAM: PORTABLE CHEST 1 VIEW COMPARISON:  None. FINDINGS: There is no focal consolidation, pleural effusion or pneumothorax. The cardiac silhouette is within normal limits. Small left hilar calcified granuloma. Atherosclerotic calcification of the aorta. No acute osseous pathology. IMPRESSION: No active disease. Electronically Signed   By: Anner Crete M.D.   On: 05/14/2019 21:17   CT HEAD CODE STROKE WO CONTRAST  Result Date: 05/14/2019 CLINICAL DATA:  Code stroke.  Altered mental status EXAM: CT HEAD WITHOUT CONTRAST TECHNIQUE: Contiguous axial images were obtained from the base of the skull through the vertex without intravenous contrast. COMPARISON:  None. FINDINGS: Brain: Generalized atrophy, moderate in degree. Moderate hypodensity throughout the cerebral white matter extending into the left internal capsule compatible chronic ischemia. Negative for acute hemorrhage or mass. No midline shift. Vascular: Atherosclerotic calcification bilaterally. Negative for hyperdense vessel Skull: Negative Sinuses/Orbits: Complete opacification left maxillary sinus with mucosal edema extending into the left frontal and ethmoid sinus. No air-fluid levels. Left maxillary sinus secretions are mildly hyperdense. Negative for orbital mass.  Bilateral cataract extraction Other: None ASPECTS ([place] Stroke Program Early CT Score) - Ganglionic level infarction (caudate, lentiform nuclei, internal capsule, insula, M1-M3 cortex): 7 - Supraganglionic infarction (M4-M6 cortex): 3 Total score (0-10 with 10 being normal): 10 IMPRESSION: 1. No acute abnormality  2. ASPECTS is 10 3. Moderate atrophy and moderate chronic microvascular ischemic change 4. These results were called by telephone at the time of interpretation on 05/14/2019 at 8:27 pm to provider Lindzen , who verbally acknowledged these results. Electronically Signed   By: Franchot Gallo M.D.   On: 05/14/2019 20:28   VAS US CAROTID (at [facility] and [facility] only)  Result Date: 05/15/2019 Carotid Arterial Duplex Study Indications:       CVA. Risk Factors:      Hypertension, hyperlipidemia, Diabetes. Limitations        Today's exam was limited due to patient movement. Comparison Study:  no prior Performing Technologist: Abram Sander RVS  Examination Guidelines: A complete evaluation includes B-mode imaging, spectral Doppler, color Doppler, and power Doppler as needed of all accessible portions of each vessel. Bilateral testing is considered an integral part of a complete examination. Limited examinations for reoccurring indications may be performed as noted.  Right Carotid Findings: +----------+--------+--------+--------+-------------------------+---------+           PSV cm/sEDV cm/sStenosisPlaque Description       Comments  +----------+--------+--------+--------+-------------------------+---------+ CCA Prox  44      9               heterogenous                       +----------+--------+--------+--------+-------------------------+---------+ CCA Distal57      12              heterogenous             Shadowing +----------+--------+--------+--------+-------------------------+---------+ ICA Prox  91      29      1-39%   heterogenous and calcificShadowing +----------+--------+--------+--------+-------------------------+---------+ ICA Distal95      35                                                 +----------+--------+--------+--------+-------------------------+---------+ ECA       177                                                         +----------+--------+--------+--------+-------------------------+---------+ +----------+--------+-------+--------+-------------------+  PSV cm/sEDV cmsDescribeArm Pressure (mmHG) +----------+--------+-------+--------+-------------------+ [id number]                                         +----------+--------+-------+--------+-------------------+ +---------+--------+--+--------+--+---------+ VertebralPSV cm/s68EDV cm/s23Antegrade +---------+--------+--+--------+--+---------+  Left Carotid Findings: +----------+--------+--------+--------+------------------------------+---------+           PSV cm/sEDV cm/sStenosisPlaque Description            Comments  +----------+--------+--------+--------+------------------------------+---------+ CCA Prox  38      8               heterogenous                            +----------+--------+--------+--------+------------------------------+---------+ CCA Distal50      10              heterogenous                            +----------+--------+--------+--------+------------------------------+---------+ ICA Prox  69      25      1-39%   heterogenous, calcific and    Shadowing                                   irregular                               +----------+--------+--------+--------+------------------------------+---------+ ICA Distal29      12                                                      +----------+--------+--------+--------+------------------------------+---------+ ECA       99                                                              +----------+--------+--------+--------+------------------------------+---------+ +----------+--------+--------+--------+-------------------+           PSV cm/sEDV cm/sDescribeArm Pressure (mmHG) +----------+--------+--------+--------+-------------------+ Subclavian80                                           +----------+--------+--------+--------+-------------------+ +---------+--------+--+--------+---------+ VertebralPSV cm/s33EDV cm/sAntegrade +---------+--------+--+--------+---------+   Summary: Right Carotid: Velocities in the right ICA are consistent with a 1-39% stenosis. Left Carotid: Velocities in the left ICA are consistent with a 1-39% stenosis. Vertebrals: Bilateral vertebral arteries demonstrate antegrade flow. *See table(s) above for measurements and observations.     Preliminary     PHYSICAL EXAM  Temp:  [97.6 F (36.4 C)-99.3 F (37.4 C)] 98.5 F (36.9 C) (02/24 1934) Pulse Rate:  [51-86] 63 (02/24 1934) Resp:  [13-23] 18 (02/24 1934) BP: (109-162)/(45-99) 155/63 (02/24 1934) SpO2:  [92 %-100 %] 99 % (02/24 1934) Weight:  [65.2 kg] 65.2 kg (02/23 2045)  General - Well nourished, well developed, in no apparent distress.  Ophthalmologic - fundi not visualized  due to noncooperation.  Cardiovascular - Regular rhythm and rate.  Mental Status -  Level of arousal and orientation to time, place, and person were intact. Language including expression, naming, repetition, comprehension was assessed and found intact.  Cranial Nerves II - XII - II - Visual field intact OU.  Left eye legally blind, able to see LP and close HW III, IV, VI - Extraocular movements intact. V - Facial sensation intact bilaterally. VII - Facial movement intact bilaterally. VIII - hard of hearing & vestibular intact bilaterally. X - Palate elevates symmetrically. XI - Chin turning & shoulder shrug intact bilaterally. XII - Tongue protrusion intact.  Motor Strength - The patient's strength was symmetrical in all extremities and pronator drift was absent.  Bulk was normal and fasciculations were absent.   Motor Tone - Muscle tone was assessed at the neck and appendages and was normal.  Reflexes - The patient's reflexes were symmetrical in all extremities and she had no pathological  reflexes.  Sensory - Light touch, temperature/pinprick were assessed and were symmetrical.    Coordination - The patient had normal movements in the hands with no ataxia or dysmetria.  Tremor was absent.  Gait and Station - deferred.   ASSESSMENT/PLAN Madison Martinez is a 86 y.o. female with history of HTN, HLD, DM2, spinal stenosis, OS glaucoma, hx BRVO presenting with confusion and aphasia after being found down.  Stroke:   2 frontoparietal cortical infarcts due to hypoperfusion in setting of dehydration and AKI wiht left tICA and MCA severe stenosis, although cardioembolic source can not be completely ruled out. Of note, pt does have mobile mass at right AV  Code Stroke CT head No acute abnormality. Moderate atrophy and small vessel disease. ASPECTS 10.     MRI  2 punctate L frontoparietal cortical infarcts. Moderate advanced Small vessel disease and Atrophy. L paranasal sinus dz.   MRA  Severe L ICA terminus to L M1 stenosis. L V4 occlusion beyond takeoff of L PICA. R V4 60% stenosis. Extensive atherosclerosis B ICA siphons w/ moderate to advanced focal stenoses. R ICA 11mm aneurysm.   Carotid Doppler  B ICA 1-39% stenosis, VAs antegrade   2D Echo EF 60-65%. Right AV mobile mass  EEG left frontal temporal intermittent slowing, no seizure  LDL 104  HgbA1c 5.5  Heparin 5000 units sq tid for VTE prophylaxis  aspirin 81 mg daily prior to admission, now on aspirin 325 mg daily and plavix DAPT for intracranial stenosis. Further regimen will depends on TEE finding  Therapy recommendations:  pending   Disposition:  pending   Intracranial stenosis  MRA showed severe left terminal ICA and M1 stenosis, extensive atherosclerosis bilateral ICA siphon with moderate to advanced focal stenosis  Chronic in nature, no intervention needed at this time  On DAPT for maximal medical treatment  Right aortic valve mobile mass  TTE showed a mass (0.8 cm x 0.5 cm) that appears to be attached  to the  right coronary cusp of the aortic valve. 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  TEE recommended  Further antithrombotic regimen depends on TEE finding  Dehydration and AKI  Creatinine baseline 1.13  This admission creatinine 2.96->3.10->1.92  Sodium 130->137  WBC 23.1  On IV fluid at 125 cc/h  Acute infectious versus inflammatory colitis  CT abdomen pelvis showed involvement of the descending colon  Leukocytosis, WBC 23.1  On Rocephin and Flagyl  IV fluid  Hypertension  Stable . Gradually  normalize to goal in 3-5 days . Long-term BP goal 130-150 given severe intracranial stenosis  Hyperlipidemia  Home meds:  pravachol 10, resumed in hospital  LDL 104, goal < 70  Increase Pravachol to 40  Continue statin at discharge  Diabetes type II Controlled  HgbA1c 5.5, goal < 7.0  CBGs  SSI  Close PCP follow-up  Other Stroke Risk Factors  Advanced age  Other Active Problems  Hyponatremia, resolved, sodium 130-137  R ICA 76mm aneurysm - follow up in neurology as [facility] # 0  Rosalin Hawking, MD PhD Stroke Neurology 05/15/2019 7:57 PM   To contact Stroke Continuity provider, please refer to [email or web address] After hours, contact General Neurology

## 2019-05-15 NOTE — Evaluation (Addendum)
Physical Therapy Evaluation Patient Details Name: Madison Martinez MRN: [id number] DOB: 08-30-1927 Today's Date: 05/15/2019   History of Present Illness  Patient is an 86 year old female admitted from home with AMS. Fall prior to admission. MRI reveals 2 subcentimeter acute ashemic cortical infarcts. PMH includes: Blind in L eye, HOH, HTN, HLD, DM  Clinical Impression  Patient received in bed, agrees to PT assessment. Reports fatigue, states she hasn't slept since she's been here. Requires min assist with bed mobility, transfers with min assist. Poor initial standing balance. Patient is able to ambulate 125 feet with RW and min assist. Cues for obstacle avoidance and to stay inside RW. Patient limited by fatigue. She will continue to benefit from skilled PT while here to improve strength and functional mobility for return home with son at discharge.      Follow Up Recommendations Home health PT;Supervision for mobility/OOB    Equipment Recommendations  None recommended by PT    Recommendations for Other Services       Precautions / Restrictions Precautions Precautions: Fall Restrictions Weight Bearing Restrictions: No      Mobility  Bed Mobility Overal bed mobility: Needs Assistance Bed Mobility: Supine to Sit     Supine to sit: Min assist        Transfers Overall transfer level: Needs assistance Equipment used: Rolling walker (2 wheeled) Transfers: Sit to/from Stand Sit to Stand: Min assist         General transfer comment: unsteadiness with initial standing balance, posterior lean.  Ambulation/Gait Ambulation/Gait assistance: Min guard Gait Distance (Feet): 125 Feet Assistive device: Rolling walker (2 wheeled) Gait Pattern/deviations: Decreased stride length;Shuffle;Step-through pattern;Narrow base of support Gait velocity: decr   General Gait Details: slow gait, cues to stay inside RW, object avoidance  Stairs            Wheelchair Mobility    Modified  Rankin (Stroke Patients Only) Modified Rankin (Stroke Patients Only) Pre-Morbid Rankin Score: No symptoms Modified Rankin: Moderately severe disability     Balance Overall balance assessment: Needs assistance Sitting-balance support: Feet supported Sitting balance-Leahy Scale: Fair     Standing balance support: Bilateral upper extremity supported;During functional activity Standing balance-Leahy Scale: Fair Standing balance comment: reliant on RW and external assist for safety                             Pertinent Vitals/Pain Pain Assessment: Faces Faces Pain Scale: Hurts little more Pain Location: B feet Pain Descriptors / Indicators: Sore;Discomfort Pain Intervention(s): Monitored during session    Home Living Family/patient expects to be discharged to:: Private residence Living Arrangements: Children Available Help at Discharge: Family;Available 24 hours/[place] Type of Home: House Home Access: Stairs to enter   [employer] of Steps: 5?  son reports she does not go out often and if she does he is there with her. Home Layout: Able to live on main level with bedroom/bathroom;Two level Home Equipment: Walker - 2 wheels      Prior Function Level of Independence: Independent with assistive device(s)               Hand Dominance        Extremity/Trunk Assessment   Upper Extremity Assessment Upper Extremity Assessment: Generalized weakness    Lower Extremity Assessment Lower Extremity Assessment: Generalized weakness    Cervical / Trunk Assessment Cervical / Trunk Assessment: Normal  Communication   Communication: HOH  Cognition Arousal/Alertness: Awake/alert Behavior  During Therapy: WFL for tasks assessed/performed Overall Cognitive Status: Within Functional Limits for tasks assessed                                        General Comments      Exercises     Assessment/Plan    PT Assessment Patient needs  continued PT services  PT Problem List Decreased strength;Decreased mobility;Decreased activity tolerance;Decreased balance;Pain;Decreased safety awareness       PT Treatment Interventions DME instruction;Gait training;Stair training;Functional mobility training;Therapeutic activities;Patient/family education;Balance training;Therapeutic exercise    PT Goals (Current goals can be found in the Care Plan section)  Acute Rehab PT Goals Patient Stated Goal: to return home, feel better PT Goal Formulation: With patient Time For Goal Achievement: 05/29/19 Potential to Achieve Goals: Good    Frequency Min 4X/week   Barriers to discharge        Co-evaluation               AM-PAC PT "6 Clicks" Mobility  Outcome Measure Help needed turning from your back to your side while in a flat bed without using bedrails?: A Little Help needed moving from lying on your back to sitting on the side of a flat bed without using bedrails?: A Little Help needed moving to and from a bed to a chair (including a wheelchair)?: A Little Help needed standing up from a chair using your arms (e.g., wheelchair or bedside chair)?: A Little Help needed to walk in hospital room?: A Little Help needed climbing 3-5 steps with a railing? : A Lot 6 Click Score: 17    End of Session Equipment Utilized During Treatment: Gait belt Activity Tolerance: Patient tolerated treatment well Patient left: in bed;with call bell/phone within reach;with bed alarm set Nurse Communication: Mobility status PT Visit Diagnosis: Unsteadiness on feet (R26.81);Difficulty in walking, not elsewhere classified (R26.2);Muscle weakness (generalized) (M62.81);History of falling (Z91.81);Pain Pain - Right/Left: (bilateral) Pain - part of body: Ankle and joints of foot    Time: [id number] PT Time Calculation (min) (ACUTE ONLY): 15 min   Charges:   PT Evaluation $PT Eval Moderate Complexity: 1 Mod PT Treatments $Gait Training: 8-22  mins        Giovana Faciane, PT, GCS 05/15/19,2:47 PM

## 2019-05-15 NOTE — Telephone Encounter (Signed)
Pt admitted to hospital per Epic records   Patient Name: Madison Martinez Gender: Female DOB: 11-Oct-1927 Age: 86 Y 96 M 8 D Return Phone Number: [phone] (Primary) Address: City/State/Zip: [place] [address] Client [facility] - Client Client Site [place] [facility] Physician Raoul Pitch, [place] Type Call Who Is Calling Patient / Member / Family / Caregiver Call Type Triage / Clinical Caller Name Max Lilia Pro Relationship To Patient Son Return Phone Number [phone] (Primary) Chief Complaint CONFUSION - new onset Reason for Call Symptomatic / Request for [place] states that mother is being incoherent, looking around for something, needs some advice. Just started. Yesterday fell but does not think it was a head injury. Wants to know what to do? Translation No Nurse Assessment Nurse: Sharlett Iles, RN, Nehemiah Settle Date/Time (Eastern Time): 05/14/2019 6:29:31 PM Confirm and document reason for call. If symptomatic, describe symptoms. ---States his mother fell yesterday and today she is incoherent. She does recognize her son and where she is. She's wandering around the house looking for something. Has the patient had close contact with a person known or suspected to have the novel coronavirus illness OR traveled / lives in area with major community spread (including international travel) in the last 14 days from the onset of symptoms? * If Asymptomatic, screen for exposure and travel within the last 14 days. ---No Does the patient have any new or worsening symptoms? ---Yes Will a triage be completed? ---Yes Related visit to physician within the last 2 weeks? ---No Does the PT have any chronic conditions? (i.e. diabetes, asthma, this includes High risk factors for pregnancy, etc.) ---Yes List chronic conditions. ---High blood pressure, pre-diabetic Is this a behavioral health or substance abuse call?  ---No Guidelines Guideline Title Affirmed Question Affirmed Notes Nurse Date/Time (Eastern Time) Confusion - Delirium [1] Acting confused (e.g., disoriented, slurred speech) AND [2] brief (now gone) Sharlett Iles, Therapist, sports, Nehemiah Settle 05/14/2019 6:31:56 PM PLEASE NOTE: All timestamps contained within this report are represented as [place] Standard Time. CONFIDENTIALTY NOTICE: This fax transmission is intended only for the addressee. It contains information that is legally privileged, confidential or otherwise protected from use or disclosure. If you are not the intended recipient, you are strictly prohibited from reviewing, disclosing, copying using or disseminating any of this information or taking any action in reliance on or regarding this information. If you have received this fax in error, please notify us immediately by telephone so that we can arrange for its return to [place]. Phone: [phone], Toll-Free: [phone], Fax: [phone] Page: 2 of 2 Call Id: [id number] [place]. Time Eilene Ghazi Time) Disposition Final User 05/14/2019 6:27:26 PM Send to Urgent Queue Lawernce Keas [id number] 6:39:28 PM Called On-Call Provider Sharlett Iles, RN, Nehemiah Settle 05/14/2019 6:36:29 PM See HCP within 4 Hours (or PCP triage) Yes Sharlett Iles, RN, Lewayne Bunting Disagree/Comply Comply Caller Understands Yes PreDisposition Call Doctor Care Advice Given Per Guideline SEE HCP WITHIN 4 HOURS (OR PCP TRIAGE): * IF OFFICE WILL BE CLOSED AND PCP SECOND-LEVEL TRIAGE REQUIRED: You may need to be seen. Your doctor (or NP/PA) will want to talk with you to decide what's best. I'll page the oncall provider now. If you haven't heard from the provider (or me) within 30 minutes, call again. NOTE: If on-call provider can't be reached, send to [facility] or ED. CALL BACK IF: * You become worse. Referrals GO TO FACILITY UNDECIDED Paging DoctorName Phone DateTime Result/Outcome Message Type Notes Billey Gosling - MD [id number] 05/14/2019 6:39:28  PM Called On Call Provider - Reached Doctor Paged Billey Gosling - MD 05/14/2019 6:39:44 PM Spoke with On Call - General Message Result Patient will need to be evaluated in the ED

## 2019-05-15 NOTE — Procedures (Signed)
Patient Name: Marliss Mareno Sommerfeld  MRN: [id number]  Epilepsy Attending: Lora Havens  Referring Physician/Provider: Dr. Shela Leff Date: 05/15/2019 Duration: 27.29 minutes  Patient history: 86yo F who presented with AMS and dysphasia. EEG to evaluate for seizure.   Level of alertness: awake  AEDs during EEG study: None  Technical aspects: This EEG study was done with scalp electrodes positioned according to the 10-20 International system of electrode placement. Electrical activity was acquired at a sampling rate of 500Hz  and reviewed with a high frequency filter of 70Hz  and a low frequency filter of 1Hz . EEG data were recorded continuously and digitally stored.   DESCRIPTION:  The posterior dominant rhythm consists of 9-10 Hz activity of moderate voltage (25-35 uV) seen predominantly in posterior head regions, symmetric and reactive to eye opening and eye closing. EEG showed intermittent left frontotemporal 2-3Hz  delta slowing, which at times appears sharply contoured. Photic driving was not seen during photic stimulation. Hyperventilation was not performed.  ABNORMALITY - Intermittent slow, left frontotemporal  IMPRESSION: This study is suggestive of non specific cortical dysfunction in left frontotemporal region. No seizures or definite epileptiform discharges were seen throughout the recording.  [place] Sanon Barbra Sarks

## 2019-05-15 NOTE — Progress Notes (Signed)
  Echocardiogram 2D Echocardiogram has been performed.  Jennette Dubin 05/15/2019, 10:52 AM

## 2019-05-15 NOTE — Progress Notes (Signed)
EEG complete - results pending 

## 2019-05-16 DIAGNOSIS — I639 Cerebral infarction, unspecified: Secondary | ICD-10-CM

## 2019-05-16 DIAGNOSIS — I358 Other nonrheumatic aortic valve disorders: Secondary | ICD-10-CM

## 2019-05-16 DIAGNOSIS — I63512 Cerebral infarction due to unspecified occlusion or stenosis of left middle cerebral artery: Secondary | ICD-10-CM

## 2019-05-16 LAB — BASIC METABOLIC PANEL
Anion gap: 10 (ref 5–15)
BUN: 29 mg/dL — ABNORMAL HIGH (ref 8–23)
CO2: 20 mmol/L — ABNORMAL LOW (ref 22–32)
Calcium: 9 mg/dL (ref 8.9–10.3)
Chloride: 105 mmol/L (ref 98–111)
Creatinine, Ser: 1.44 mg/dL — ABNORMAL HIGH (ref 0.44–1.00)
GFR calc Af Amer: 37 mL/min — ABNORMAL LOW (ref >60)
GFR calc non Af Amer: 32 mL/min — ABNORMAL LOW (ref >60)
Glucose, Bld: 109 mg/dL — ABNORMAL HIGH (ref 70–99)
Potassium: 4 mmol/L (ref 3.5–5.1)
Sodium: 135 mmol/L (ref 135–145)

## 2019-05-16 MED ORDER — GERHARDT'S BUTT CREAM
TOPICAL_CREAM | Freq: Four times a day (QID) | CUTANEOUS | Status: AC
  Filled 2019-05-16: qty 1

## 2019-05-16 MED ORDER — LATANOPROST 0.005 % OP SOLN
1.0000 [drp] | Freq: Every day | OPHTHALMIC | Status: DC
  Administered 2019-05-16 – 2019-05-17 (×2): 1 [drp] via OPHTHALMIC
  Filled 2019-05-16: qty 2.5

## 2019-05-16 MED ORDER — SACCHAROMYCES BOULARDII 250 MG PO CAPS
250.0000 mg | ORAL_CAPSULE | Freq: Two times a day (BID) | ORAL | Status: DC
  Administered 2019-05-16 – 2019-05-18 (×6): 250 mg via ORAL
  Filled 2019-05-16 (×7): qty 1

## 2019-05-16 MED ORDER — SODIUM CHLORIDE 0.9 % IV SOLN: INTRAVENOUS | Status: DC

## 2019-05-16 MED ORDER — METRONIDAZOLE 500 MG PO TABS
500.0000 mg | ORAL_TABLET | Freq: Three times a day (TID) | ORAL | Status: DC
  Administered 2019-05-16 – 2019-05-17 (×3): 500 mg via ORAL
  Filled 2019-05-16 (×4): qty 1

## 2019-05-16 NOTE — Anesthesia Preprocedure Evaluation (Addendum)
Anesthesia Evaluation  Patient identified by MRN, date of birth, ID band Patient awake    Reviewed: Allergy & Precautions, NPO status , Patient's Chart, lab work & pertinent test results  Airway Mallampati: II  TM Distance: >3 FB Neck ROM: Full    Dental no notable dental hx. (+) Lower Dentures, Partial Lower, Dental Advisory Given   Pulmonary    Pulmonary exam normal breath sounds clear to auscultation       Cardiovascular hypertension, Pt. on medications and Pt. on home beta blockers Normal cardiovascular exam+ Valvular Problems/Murmurs  Rhythm:Regular Rate:Normal  05/15/19 EF 60%   Neuro/Psych CVA    GI/Hepatic negative GI ROS, Neg liver ROS,   Endo/Other  diabetes, Type 2  Renal/GU      Musculoskeletal   Abdominal   Peds  Hematology negative hematology ROS (+)   Anesthesia Other Findings   Reproductive/Obstetrics                         Anesthesia Physical Anesthesia Plan  ASA: III  Anesthesia Plan: MAC   Post-op Pain Management:    Induction: Intravenous  PONV Risk Score and Plan: Treatment may vary due to age or medical condition  Airway Management Planned: Nasal Cannula and Natural Airway  Additional Equipment:   Intra-op Plan:   Post-operative Plan:   Informed Consent: I have reviewed the patients History and Physical, chart, labs and discussed the procedure including the risks, benefits and alternatives for the proposed anesthesia with the patient or authorized representative who has indicated his/her understanding and acceptance.     Dental advisory given  Plan Discussed with: CRNA  Anesthesia Plan Comments:        Anesthesia Quick Evaluation

## 2019-05-16 NOTE — Progress Notes (Signed)
STROKE TEAM PROGRESS NOTE   INTERVAL HISTORY Patient sitting in chair, awake alert, pleasant.  No acute distress.  No acute events overnight.  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  Recommend TEE.  Vitals:   05/15/19 2323 05/16/19 0314 05/16/19 0720 05/16/19 1237  BP: 139/62 (!) 151/62 138/77 (!) 141/60  Pulse: 66 70 71 71  Resp: 18 18 18 18   Temp: 98.3 F (36.8 C) 98.1 F (36.7 C) 98.8 F (37.1 C) 98 F (36.7 C)  TempSrc: Oral Oral Oral Oral  SpO2: 99% 99% 94% 95%  Weight:      Height:        CBC:  Recent Labs  Lab 05/14/19 2012 05/14/19 2012 05/14/19 2021 05/15/19 1002  WBC 23.1*  --   --  17.4*  NEUTROABS 14.8*  --   --   --   HGB 12.4   < > 12.2 11.7*  HCT 37.3   < > 36.0 36.4  MCV 95.2  --   --  95.8  PLT 198  --   --  184   < > = values in this interval not displayed.    Basic Metabolic Panel:  Recent Labs  Lab 05/15/19 1002 05/16/19 0028  NA 137 135  K 3.7 4.0  CL 105 105  CO2 22 20*  GLUCOSE 129* 109*  BUN 36* 29*  CREATININE 1.92* 1.44*  CALCIUM 9.0 9.0   Lipid Panel:     Component Value Date/Time   CHOL 190 05/15/2019 1002   TRIG 276 (H) 05/15/2019 1002   HDL 31 (L) 05/15/2019 1002   CHOLHDL 6.1 05/15/2019 1002   VLDL 55 (H) 05/15/2019 1002   LDLCALC 104 (H) 05/15/2019 1002   HgbA1c:  Lab Results  Component Value Date   HGBA1C 5.5 05/15/2019   IMAGING past 48 hours EEG  Result Date: 05/15/2019 Madison Havens, MD     05/15/2019  2:11 PM Patient Name: Madison Martinez MRN: [id number] Epilepsy Attending: Lora Martinez Referring Physician/Provider: Dr. Shela Leff Date: 05/15/2019 Duration: 27.29 minutes Patient history: 86yo F who presented with AMS and dysphasia. EEG to evaluate for seizure. Level of alertness: awake AEDs during EEG study: None Technical aspects: This EEG study was done with scalp electrodes positioned according to the 10-20 International system of electrode placement. Electrical activity was  acquired at a sampling rate of 500Hz  and reviewed with a high frequency filter of 70Hz  and a low frequency filter of 1Hz . EEG data were recorded continuously and digitally stored. DESCRIPTION:  The posterior dominant rhythm consists of 9-10 Hz activity of moderate voltage (25-35 uV) seen predominantly in posterior head regions, symmetric and reactive to eye opening and eye closing. EEG showed intermittent left frontotemporal 2-3Hz  delta slowing, which at times appears sharply contoured. Photic driving was not seen during photic stimulation. Hyperventilation was not performed. ABNORMALITY - Intermittent slow, left frontotemporal IMPRESSION: This study is suggestive of non specific cortical dysfunction in left frontotemporal region. No seizures or definite epileptiform discharges were seen throughout the recording. Madison Martinez   CT Abdomen Pelvis Wo Contrast  Result Date: 05/14/2019 CLINICAL DATA:  86 year old female with nausea and vomiting. EXAM: CT ABDOMEN AND PELVIS WITHOUT CONTRAST TECHNIQUE: Multidetector CT imaging of the abdomen and pelvis was performed following the standard protocol without IV contrast. COMPARISON:  None. FINDINGS: Evaluation of this exam is limited in the absence of intravenous contrast. Lower chest: The visualized lung bases are clear. No intra-abdominal free air or  free fluid. Hepatobiliary: The liver is unremarkable. No intrahepatic biliary ductal dilatation. Multiple small stones noted within the gallbladder. The gallbladder is mildly distended. No pericholecystic fluid. Ultrasound may provide better evaluation of the gallbladder if there is clinical concern for acute cholecystitis. Pancreas: Unremarkable. No pancreatic ductal dilatation or surrounding inflammatory changes. Spleen: Normal in size without focal abnormality. Adrenals/Urinary Tract: The adrenal glands are unremarkable. There is no hydronephrosis or nephrolithiasis on either side. The visualized ureters appear  unremarkable. The urinary bladder is suboptimally visualized due to streak artifact caused by bilateral hip arthroplasties. Stomach/Bowel: There is sigmoid diverticulosis without active inflammatory changes. There is diffuse inflammatory changes and thickening of the descending colon consistent with colitis. This is likely inflammatory or infectious in etiology. Ischemia is less likely but not excluded. There is no pneumatosis. Correlation with clinical exam and stool cultures recommended. There is no bowel obstruction. The appendix is normal. Vascular/Lymphatic: Advanced aortoiliac atherosclerotic disease. The IVC is grossly unremarkable. No portal venous gas. There is no adenopathy. Reproductive: The uterus is grossly unremarkable. There is a 2.5 x 2.5 cm high attenuating structure associated with the right ovary which is not characterized on this CT. This may represent normal ovarian tissue or an ovarian lesion. Evaluation of the pelvic structures is very limited due to streak artifact caused by bilateral metallic hip arthroplasties. Further evaluation with ultrasound on a nonemergent/outpatient basis recommended. Other: None Musculoskeletal: Osteopenia with degenerative changes of the spine. Bilateral hip arthroplasties. No acute osseous pathology. IMPRESSION: 1. Colitis involving the descending colon, likely inflammatory/infectious in etiology. Correlation with clinical exam and stool cultures recommended. No bowel obstruction. Normal appendix. 2. Sigmoid diverticulosis. 3. Cholelithiasis. 4. Advanced Aortic Atherosclerosis (ICD10-I70.0). Electronically Signed   By: Anner Crete M.D.   On: 05/14/2019 22:25   MR ANGIO HEAD WO CONTRAST  Result Date: 05/15/2019 CLINICAL DATA:  Initial evaluation for acute altered mental status. EXAM: MRI HEAD WITHOUT CONTRAST MRA HEAD WITHOUT CONTRAST TECHNIQUE: Multiplanar, multiecho pulse sequences of the brain and surrounding structures were obtained without intravenous  contrast. Angiographic images of the head were obtained using MRA technique without contrast. COMPARISON:  Prior CT from 05/14/2019. FINDINGS: MRI HEAD FINDINGS Brain: Moderately advanced cerebral atrophy. Patchy and confluent T2/FLAIR hyperintensity within the periventricular deep white matter both cerebral hemispheres most consistent with chronic small vessel ischemic disease, moderate to advanced in nature. Superimposed remote lacunar infarcts noted involving the bilateral basal ganglia. Additional small remote lacune noted at the central pons. Tiny remote left cerebellar infarct. Punctate 4 mm focus of restricted diffusion seen involving the cortex of the posterior left frontoparietal region (series 5, image 77). Additional tiny punctate 4-5 mm focus of restricted diffusion seen involving the cortical gray matter of the posterior left parietal lobe (series 5, image 83). Findings consistent with tiny acute ischemic infarcts, likely embolic. No associated hemorrhage or mass effect. No other evidence for acute or subacute ischemia. Gray-white matter differentiation otherwise maintained. No foci of susceptibility artifact to suggest acute or chronic intracranial hemorrhage. No mass lesion, midline shift or mass effect. Diffuse ventricular prominence related to global parenchymal volume loss without hydrocephalus. No extra-axial fluid collection. Pituitary gland within normal limits. Vascular: Absent flow void within the hypoplastic left vertebral artery, likely occluded. Major intracranial vascular flow voids otherwise maintained. Skull and upper cervical spine: Craniocervical junction within normal limits. Bone marrow signal intensity normal. No scalp soft tissue abnormality. Sinuses/Orbits: Patient status post bilateral ocular lens replacement. Opacification of the left frontoethmoidal sinuses, with moderate mucosal thickening  within the left maxillary sinus. Paranasal sinuses are otherwise clear. Trace bilateral  mastoid effusions, of doubtful significance. Inner ear structures grossly normal. Other: None. MRA HEAD FINDINGS ANTERIOR CIRCULATION: Distal cervical segments of the internal carotid arteries are patent with symmetric antegrade flow. Petrous segments widely patent bilaterally. Scattered calcified plaque throughout the cavernous/supraclinoid ICAs bilaterally. Associated moderate diffuse narrowing throughout the cavernous left ICA. Associated moderate to severe multifocal narrowing at the anterior genu of the cavernous right ICA. 5 mm focal outpouching extending laterally and slightly posteriorly from the anterior genu of the cavernous right ICA consistent with aneurysm (series 7, image 82). A1 segments patent bilaterally. Normal anterior communicating artery. A2 segments widely patent proximally. Short-segment moderate to severe distal bilateral A2 stenoses noted. There is a focal severe fairly long segment stenosis extending from the left carotid terminus into the mid left M1 segment with loss of normal flow related signal. Stenosis measures approximately 8 mm in length. Left M1 segment patent distally. Normal left MCA bifurcation. Left MCA branches well perfused distally. Right M1 irregular but widely patent. Normal right MCA bifurcation. Distal right MCA branches well perfused. POSTERIOR CIRCULATION: Dominant right vertebral artery with hypoplastic left vertebral artery. Faint attenuated flow seen within the left vertebral artery at the skull base, and remains grossly patent to the level of the left PICA left PICA perfused. Distally, left V4 segment is occluded to the vertebrobasilar junction. Dominant right vertebral artery widely patent at the skull base. Irregular approximate 60% stenosis seen involving the mid right V4 segment. Right PICA patent. Mild multifocal irregularity seen throughout the basilar artery which remains widely patent to its distal aspect. Superior cerebral arteries patent bilaterally. Both  PCAs primarily supplied via the basilar. Multifocal atheromatous irregularity without high-grade stenosis seen throughout both PCAs. IMPRESSION: MRI HEAD IMPRESSION: 1. Two punctate subcentimeter acute ischemic nonhemorrhagic cortical infarcts involving the left frontoparietal region as above, likely embolic in nature. 2. Underlying moderately advanced cerebral atrophy with chronic small vessel ischemic disease. 3. Left sided paranasal sinus disease as above. MRA HEAD IMPRESSION: 1. Approximate 8 mm severe stenosis extending from the left carotid terminus into the left M1 segment. Left MCA branches are perfused distally. 2. 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 Short-segment approximate 60% right V4 stenosis as above. 3. Extensive atheromatous change throughout the carotid siphons with associated moderate to advanced multifocal stenoses. 4. 5 mm cavernous right ICA aneurysm as above. Electronically Signed   By: Jeannine Boga M.D.   On: 05/15/2019 02:37   MR BRAIN WO CONTRAST  Result Date: 05/15/2019 CLINICAL DATA:  Initial evaluation for acute altered mental status. EXAM: MRI HEAD WITHOUT CONTRAST MRA HEAD WITHOUT CONTRAST TECHNIQUE: Multiplanar, multiecho pulse sequences of the brain and surrounding structures were obtained without intravenous contrast. Angiographic images of the head were obtained using MRA technique without contrast. COMPARISON:  Prior CT from 05/14/2019. FINDINGS: MRI HEAD FINDINGS Brain: Moderately advanced cerebral atrophy. Patchy and confluent T2/FLAIR hyperintensity within the periventricular deep white matter both cerebral hemispheres most consistent with chronic small vessel ischemic disease, moderate to advanced in nature. Superimposed remote lacunar infarcts noted involving the bilateral basal ganglia. Additional small remote lacune noted at the central pons. Tiny remote left cerebellar infarct. Punctate 4 mm focus of restricted  diffusion seen involving the cortex of the posterior left frontoparietal region (series 5, image 77). Additional tiny punctate 4-5 mm focus of restricted diffusion seen involving the cortical gray matter of the posterior  left parietal lobe (series 5, image 83). Findings consistent with tiny acute ischemic infarcts, likely embolic. No associated hemorrhage or mass effect. No other evidence for acute or subacute ischemia. Gray-white matter differentiation otherwise maintained. No foci of susceptibility artifact to suggest acute or chronic intracranial hemorrhage. No mass lesion, midline shift or mass effect. Diffuse ventricular prominence related to global parenchymal volume loss without hydrocephalus. No extra-axial fluid collection. Pituitary gland within normal limits. Vascular: Absent flow void within the hypoplastic left vertebral artery, likely occluded. Major intracranial vascular flow voids otherwise maintained. Skull and upper cervical spine: Craniocervical junction within normal limits. Bone marrow signal intensity normal. No scalp soft tissue abnormality. Sinuses/Orbits: Patient status post bilateral ocular lens replacement. Opacification of the left frontoethmoidal sinuses, with moderate mucosal thickening within the left maxillary sinus. Paranasal sinuses are otherwise clear. Trace bilateral mastoid effusions, of doubtful significance. Inner ear structures grossly normal. Other: None. MRA HEAD FINDINGS ANTERIOR CIRCULATION: Distal cervical segments of the internal carotid arteries are patent with symmetric antegrade flow. Petrous segments widely patent bilaterally. Scattered calcified plaque throughout the cavernous/supraclinoid ICAs bilaterally. Associated moderate diffuse narrowing throughout the cavernous left ICA. Associated moderate to severe multifocal narrowing at the anterior genu of the cavernous right ICA. 5 mm focal outpouching extending laterally and slightly posteriorly from the anterior genu  of the cavernous right ICA consistent with aneurysm (series 7, image 82). A1 segments patent bilaterally. Normal anterior communicating artery. A2 segments widely patent proximally. Short-segment moderate to severe distal bilateral A2 stenoses noted. There is a focal severe fairly long segment stenosis extending from the left carotid terminus into the mid left M1 segment with loss of normal flow related signal. Stenosis measures approximately 8 mm in length. Left M1 segment patent distally. Normal left MCA bifurcation. Left MCA branches well perfused distally. Right M1 irregular but widely patent. Normal right MCA bifurcation. Distal right MCA branches well perfused. POSTERIOR CIRCULATION: Dominant right vertebral artery with hypoplastic left vertebral artery. Faint attenuated flow seen within the left vertebral artery at the skull base, and remains grossly patent to the level of the left PICA left PICA perfused. Distally, left V4 segment is occluded to the vertebrobasilar junction. Dominant right vertebral artery widely patent at the skull base. Irregular approximate 60% stenosis seen involving the mid right V4 segment. Right PICA patent. Mild multifocal irregularity seen throughout the basilar artery which remains widely patent to its distal aspect. Superior cerebral arteries patent bilaterally. Both PCAs primarily supplied via the basilar. Multifocal atheromatous irregularity without high-grade stenosis seen throughout both PCAs. IMPRESSION: MRI HEAD IMPRESSION: 1. Two punctate subcentimeter acute ischemic nonhemorrhagic cortical infarcts involving the left frontoparietal region as above, likely embolic in nature. 2. Underlying moderately advanced cerebral atrophy with chronic small vessel ischemic disease. 3. Left sided paranasal sinus disease as above. MRA HEAD IMPRESSION: 1. Approximate 8 mm severe stenosis extending from the left carotid terminus into the left M1 segment. Left MCA branches are perfused  distally. 2. 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 Short-segment approximate 60% right V4 stenosis as above. 3. Extensive atheromatous change throughout the carotid siphons with associated moderate to advanced multifocal stenoses. 4. 5 mm cavernous right ICA aneurysm as above. Electronically Signed   By: Jeannine Boga M.D.   On: 05/15/2019 02:37   US RENAL  Result Date: 05/15/2019 CLINICAL DATA:  Acute kidney injury EXAM: RENAL / URINARY TRACT ULTRASOUND COMPLETE COMPARISON:  CT abdomen pelvis 05/14/2019 FINDINGS: Right Kidney: Renal  measurements: 8.6 x 5.3 x 3.1 cm = volume: 73.8 mL . Echogenicity within normal limits. No worrisome mass, shadowing calculus or hydronephrosis visualized. Left Kidney: Renal measurements: 9.4 x 5.3 x 3.6 cm = volume: 94 mL. 0.8 x 0.6 x 0.6 cm anechoic cystic lesion in the interpolar left kidney most compatible with simple renal cyst corresponding well to a partially exophytic cyst on CT. No worrisome renal lesion. No shadowing calculus or hydronephrosis. Bladder: Appears normal for degree of bladder distention. Other: None. IMPRESSION: Anechoic 8 mm likely simple cyst in the left kidney. Otherwise unremarkable renal ultrasound. Electronically Signed   By: Lovena Le M.D.   On: 05/15/2019 03:49   DG Chest Port 1 View  Result Date: 05/14/2019 CLINICAL DATA:  86 year old female with nonspecific chest pain. EXAM: PORTABLE CHEST 1 VIEW COMPARISON:  None. FINDINGS: There is no focal consolidation, pleural effusion or pneumothorax. The cardiac silhouette is within normal limits. Small left hilar calcified granuloma. Atherosclerotic calcification of the aorta. No acute osseous pathology. IMPRESSION: No active disease. Electronically Signed   By: Anner Crete M.D.   On: 05/14/2019 21:17   ECHOCARDIOGRAM COMPLETE  Result Date: 05/15/2019    ECHOCARDIOGRAM REPORT   Patient Name:   CHARESE WERGIN Date of Exam: 05/15/2019 Medical  Rec #:  [id number]   Height:       64.5 in Accession #:    [id number]  Weight:       143.7 lb Date of Birth:  1927/08/10   BSA:          1.709 m Patient Age:    44 years    BP:           138/52 mmHg Patient Gender: F           HR:           68 bpm. Exam Location:  Inpatient Procedure: 2D Echo Indications:    Stroke I163.9  History:        Patient has no prior history of Echocardiogram examinations.                 Risk Factors:Diabetes, Hypertension and Dyslipidemia.  Sonographer:    Mikki Santee RDCS (AE) Referring Phys: [id number] [place]  1. There is a mass (0.8 cm x 0.5 cm) that appears to be attached to the right coronary cusp of the aortic valve. It does appear calcified and is best seen in the PLAX views. There is no destruction of the valve to suggest vegetation/endocarditis. 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 A TEE is recommended for better characterization in this patient with stroke. The aortic valve is tricuspid. Aortic valve regurgitation is not visualized. No aortic stenosis is present.  2. Left ventricular ejection fraction, by estimation, is 60 to 65%. The left ventricle has normal function. The left ventricle has no regional wall motion abnormalities. Left ventricular diastolic parameters are indeterminate.  3. Right ventricular systolic function is normal. The right ventricular size is normal. There is normal pulmonary artery systolic pressure. The estimated right ventricular systolic pressure is [id number] mmHg.  4. The mitral valve is degenerative. Mild mitral valve regurgitation. No evidence of mitral stenosis.  5. The inferior vena cava is normal in size with greater than 50% respiratory variability, suggesting right atrial pressure of 3 mmHg. Conclusion(s)/Recommendation(s): Findings concerning for aortic valve mass, would recommend Transesophageal Echocardiogram for clarification. FINDINGS  Left Ventricle: Left ventricular ejection fraction,  by estimation, is 60 to 65%.  The left ventricle has normal function. The left ventricle has no regional wall motion abnormalities. The left ventricular internal cavity size was normal in size. There is  no left ventricular hypertrophy. Left ventricular diastolic parameters are indeterminate. Right Ventricle: The right ventricular size is normal. No increase in right ventricular wall thickness. Right ventricular systolic function is normal. There is normal pulmonary artery systolic pressure. The tricuspid regurgitant velocity is 2.59 m/s, and  with an assumed right atrial pressure of 3 mmHg, the estimated right ventricular systolic pressure is [id number] mmHg. Left Atrium: Left atrial size was normal in size. Right Atrium: Right atrial size was normal in size. Pericardium: There is no evidence of pericardial effusion. Presence of pericardial fat pad. Mitral Valve: The mitral valve is degenerative in appearance. Mild mitral annular calcification. Mild mitral valve regurgitation. No evidence of mitral valve stenosis. Tricuspid Valve: The tricuspid valve is grossly normal. Tricuspid valve regurgitation is mild . No evidence of tricuspid stenosis. Aortic Valve: There is a mass (0.8 cm x 0.5 cm) that appears to be attached to the right coronary cusp of the aortic valve. It does appear calcified and is best seen in the PLAX views. There is no destruction of the valve to suggest vegetation/endocarditis. 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 A TEE is recommended for better characterization in this patient with stroke. The aortic valve is tricuspid. Aortic valve regurgitation is not visualized. No aortic stenosis is present. Pulmonic Valve: The pulmonic valve was grossly normal. Pulmonic valve regurgitation is trivial. Aorta: The aortic root is normal in size and structure. Venous: The inferior vena cava is normal in size with greater than 50% respiratory variability, suggesting right atrial pressure  of 3 mmHg. IAS/Shunts: No atrial level shunt detected by color flow Doppler.  LEFT VENTRICLE PLAX 2D LVIDd:         4.80 cm  Diastology LVIDs:         3.20 cm  LV e' lateral:   5.77 cm/s LV PW:         0.90 cm  LV E/e' lateral: 14.4 LV IVS:        1.10 cm  LV e' medial:    5.55 cm/s LVOT diam:     2.00 cm  LV E/e' medial:  14.9 LV SV:         56 LV SV Index:   33 LVOT Area:     3.14 cm  RIGHT VENTRICLE RV Basal diam:  3.40 cm RV Mid diam:    3.40 cm RV S prime:     13.60 cm/s TAPSE (M-mode): 2.2 cm LEFT ATRIUM             Index       RIGHT ATRIUM           Index LA diam:        3.50 cm 2.05 cm/m  RA Area:     16.80 cm LA Vol (A2C):   40.9 ml 23.93 ml/m RA Volume:   47.50 ml  27.79 ml/m LA Vol (A4C):   57.6 ml 33.70 ml/m LA Biplane Vol: 48.8 ml 28.55 ml/m  AORTIC VALVE LVOT Vmax:   84.90 cm/s LVOT Vmean:  54.700 cm/s LVOT VTI:    0.177 m  AORTA Ao Root diam: 2.50 cm MITRAL VALVE                TRICUSPID VALVE MV Area (PHT): 2.50 cm     TR Peak grad:  26.8 mmHg MV Decel Time: 303 msec     TR Vmax:        259.00 cm/s MV E velocity: 82.90 cm/s MV A velocity: 111.00 cm/s  SHUNTS MV E/A ratio:  0.75         Systemic VTI:  0.18 m                             Systemic Diam: 2.00 cm Eleonore Chiquito MD Electronically signed by Eleonore Chiquito MD Signature Date/Time: 05/15/2019/2:03:10 PM    Final    CT HEAD CODE STROKE WO CONTRAST  Result Date: 05/14/2019 CLINICAL DATA:  Code stroke.  Altered mental status EXAM: CT HEAD WITHOUT CONTRAST TECHNIQUE: Contiguous axial images were obtained from the base of the skull through the vertex without intravenous contrast. COMPARISON:  None. FINDINGS: Brain: Generalized atrophy, moderate in degree. Moderate hypodensity throughout the cerebral white matter extending into the left internal capsule compatible chronic ischemia. Negative for acute hemorrhage or mass. No midline shift. Vascular: Atherosclerotic calcification bilaterally. Negative for hyperdense vessel Skull: Negative  Sinuses/Orbits: Complete opacification left maxillary sinus with mucosal edema extending into the left frontal and ethmoid sinus. No air-fluid levels. Left maxillary sinus secretions are mildly hyperdense. Negative for orbital mass.  Bilateral cataract extraction Other: None ASPECTS ([place] Stroke Program Early CT Score) - Ganglionic level infarction (caudate, lentiform nuclei, internal capsule, insula, M1-M3 cortex): 7 - Supraganglionic infarction (M4-M6 cortex): 3 Total score (0-10 with 10 being normal): 10 IMPRESSION: 1. No acute abnormality 2. ASPECTS is 10 3. Moderate atrophy and moderate chronic microvascular ischemic change 4. These results were called by telephone at the time of interpretation on 05/14/2019 at 8:27 pm to provider Lindzen , who verbally acknowledged these results. Electronically Signed   By: Franchot Gallo M.D.   On: 05/14/2019 20:28   VAS US CAROTID (at [facility] and [facility] only)  Result Date: 05/15/2019 Carotid Arterial Duplex Study Indications:       CVA. Risk Factors:      Hypertension, hyperlipidemia, Diabetes. Limitations        Today's exam was limited due to patient movement. Comparison Study:  no prior Performing Technologist: Abram Sander RVS  Examination Guidelines: A complete evaluation includes B-mode imaging, spectral Doppler, color Doppler, and power Doppler as needed of all accessible portions of each vessel. Bilateral testing is considered an integral part of a complete examination. Limited examinations for reoccurring indications may be performed as noted.  Right Carotid Findings: +----------+--------+--------+--------+-------------------------+---------+           PSV cm/sEDV cm/sStenosisPlaque Description       Comments  +----------+--------+--------+--------+-------------------------+---------+ CCA Prox  44      9               heterogenous                       +----------+--------+--------+--------+-------------------------+---------+ CCA Distal57      12               heterogenous             Shadowing +----------+--------+--------+--------+-------------------------+---------+ ICA Prox  91      29      1-39%   heterogenous and calcificShadowing +----------+--------+--------+--------+-------------------------+---------+ ICA Distal95      35                                                 +----------+--------+--------+--------+-------------------------+---------+  ECA       177                                                        +----------+--------+--------+--------+-------------------------+---------+ +----------+--------+-------+--------+-------------------+           PSV cm/sEDV cmsDescribeArm Pressure (mmHG) +----------+--------+-------+--------+-------------------+ [id number]                                         +----------+--------+-------+--------+-------------------+ +---------+--------+--+--------+--+---------+ VertebralPSV cm/s68EDV cm/s23Antegrade +---------+--------+--+--------+--+---------+  Left Carotid Findings: +----------+--------+--------+--------+------------------------------+---------+           PSV cm/sEDV cm/sStenosisPlaque Description            Comments  +----------+--------+--------+--------+------------------------------+---------+ CCA Prox  38      8               heterogenous                            +----------+--------+--------+--------+------------------------------+---------+ CCA Distal50      10              heterogenous                            +----------+--------+--------+--------+------------------------------+---------+ ICA Prox  69      25      1-39%   heterogenous, calcific and    Shadowing                                   irregular                               +----------+--------+--------+--------+------------------------------+---------+ ICA Distal29      12                                                       +----------+--------+--------+--------+------------------------------+---------+ ECA       99                                                              +----------+--------+--------+--------+------------------------------+---------+ +----------+--------+--------+--------+-------------------+           PSV cm/sEDV cm/sDescribeArm Pressure (mmHG) +----------+--------+--------+--------+-------------------+ Subclavian80                                          +----------+--------+--------+--------+-------------------+ +---------+--------+--+--------+---------+ VertebralPSV cm/s33EDV cm/sAntegrade +---------+--------+--+--------+---------+   Summary: Right Carotid: Velocities in the right ICA are consistent with a 1-39% stenosis. Left Carotid: Velocities in the left ICA are consistent with a 1-39% stenosis. Vertebrals: Bilateral vertebral arteries demonstrate antegrade flow. *See table(s) above for measurements and observations.  Preliminary     PHYSICAL EXAM   Temp:  [98 F (36.7 C)-98.9 F (37.2 C)] 98 F (36.7 C) (02/25 1237) Pulse Rate:  [62-71] 71 (02/25 1237) Resp:  [18] 18 (02/25 1237) BP: (138-162)/(60-77) 141/60 (02/25 1237) SpO2:  [94 %-99 %] 95 % (02/25 1237)  General - Well nourished, well developed, in no apparent distress.  Ophthalmologic - fundi not visualized due to noncooperation.  Cardiovascular - Regular rhythm and rate.  Mental Status -  Level of arousal and orientation to time, place, and person were intact. Language including expression, naming, repetition, comprehension was assessed and found intact.  Cranial Nerves II - XII - II - Visual field intact OU.  Left eye legally blind, able to see LP and close distance HW III, IV, VI - Extraocular movements intact. V - Facial sensation intact bilaterally. VII - Facial movement intact bilaterally. VIII - hard of hearing & vestibular intact bilaterally. X - Palate elevates symmetrically. XI -  Chin turning & shoulder shrug intact bilaterally. XII - Tongue protrusion intact.  Motor Strength - The patient's strength was symmetrical in all extremities and pronator drift was absent.  Bulk was normal and fasciculations were absent.   Motor Tone - Muscle tone was assessed at the neck and appendages and was normal.  Reflexes - The patient's reflexes were symmetrical in all extremities and she had no pathological reflexes.  Sensory - Light touch, temperature/pinprick were assessed and were symmetrical.    Coordination - The patient had normal movements in the hands with no ataxia or dysmetria.  Tremor was absent.  Gait and Station - deferred.   ASSESSMENT/PLAN [name] is a 86 y.o. female with history of HTN, HLD, DM2, spinal stenosis, OS glaucoma, hx BRVO presenting with confusion and aphasia after being found down.  Stroke:   2 frontoparietal cortical infarcts due to hypoperfusion in setting of dehydration and AKI wiht left tICA and MCA severe stenosis, although cardioembolic source can not be completely ruled out. Of note, pt does have mobile mass at right AV  Code Stroke CT head No acute abnormality. Moderate atrophy and small vessel disease. ASPECTS 10.     MRI  2 punctate L frontoparietal cortical infarcts. Moderate advanced Small vessel disease and Atrophy. L paranasal sinus dz.   MRA  Severe L ICA terminus to L M1 stenosis. L V4 occlusion beyond takeoff of L PICA. R V4 60% stenosis. Extensive atherosclerosis B ICA siphons w/ moderate to advanced focal stenoses. R ICA 61mm aneurysm.   Carotid Doppler  B ICA 1-39% stenosis, VAs antegrade   2D Echo EF 60-65%. Right AV mobile mass  EEG left frontal temporal intermittent slowing, no seizure  LDL 104  HgbA1c 5.5  Heparin 5000 units sq tid for VTE prophylaxis  aspirin 81 mg daily prior to admission, now on aspirin 325 mg daily and plavix DAPT for intracranial stenosis. Further regimen will depends on TEE finding  tomorrow  Therapy recommendations:  HH PT, HH OT, HH SLP   Disposition:  pending   Intracranial stenosis  MRA showed severe left terminal ICA and M1 stenosis, extensive atherosclerosis bilateral ICA siphon with moderate to advanced focal stenosis  Chronic in nature, no intervention needed at this time  On DAPT for maximal medical treatment  Right aortic valve mobile mass  TTE showed a mass (0.8 cm x 0.5 cm) that appears to be attached to the  right coronary cusp of the aortic valve. 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  TEE pending in am  Further antithrombotic regimen depends on TEE finding tomorrow  Dehydration and AKI  Cre baseline 1.13  This admission creatinine 2.96->3.10->1.92->1.44   Sodium 130->137->135   WBC 23.1->17.4   Off IV fluid  Acute infectious versus inflammatory colitis  CT abdomen pelvis showed involvement of the descending colon  Leukocytosis, WBC 23.1->17.4   On Rocephin and Flagyl  Off IV fluid now  Hypertension  Stable . Gradually normalize to goal in 3-5 days . Long-term BP goal 130-150 given severe intracranial stenosis  Hyperlipidemia  Home meds:  pravachol 10, resumed in hospital  LDL 104, goal < 70  Increase Pravachol to 40  Continue statin at discharge  Diabetes type II Controlled  HgbA1c 5.5, goal < 7.0  CBGs  SSI  Close PCP follow-up  Other Stroke Risk Factors  Advanced age  Other Active Problems  Hyponatremia, resolved, sodium 130-137-135   R ICA 81mm aneurysm - follow up in neurology as [facility]  Rosalin Hawking, MD PhD Stroke Neurology 05/16/2019 1:18 PM   To contact Stroke Continuity provider, please refer to [email or web address] After hours, contact General Neurology

## 2019-05-16 NOTE — Progress Notes (Signed)
Patient had moderate amount of clear blood from rectum once in this shift; Oncall provider notified; and will continue to monitor closely for any changes.

## 2019-05-16 NOTE — H&P (View-Only) (Signed)
STROKE TEAM PROGRESS NOTE   INTERVAL HISTORY Patient sitting in chair, awake alert, pleasant.  No acute distress.  No acute events overnight.  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  Recommend TEE.  Vitals:   05/15/19 2323 05/16/19 0314 05/16/19 0720 05/16/19 1237  BP: 139/62 (!) 151/62 138/77 (!) 141/60  Pulse: 66 70 71 71  Resp: 18 18 18 18   Temp: 98.3 F (36.8 C) 98.1 F (36.7 C) 98.8 F (37.1 C) 98 F (36.7 C)  TempSrc: Oral Oral Oral Oral  SpO2: 99% 99% 94% 95%  Weight:      Height:        CBC:  Recent Labs  Lab 05/14/19 2012 05/14/19 2012 05/14/19 2021 05/15/19 1002  WBC 23.1*  --   --  17.4*  NEUTROABS 14.8*  --   --   --   HGB 12.4   < > 12.2 11.7*  HCT 37.3   < > 36.0 36.4  MCV 95.2  --   --  95.8  PLT 198  --   --  184   < > = values in this interval not displayed.    Basic Metabolic Panel:  Recent Labs  Lab 05/15/19 1002 05/16/19 0028  NA 137 135  K 3.7 4.0  CL 105 105  CO2 22 20*  GLUCOSE 129* 109*  BUN 36* 29*  CREATININE 1.92* 1.44*  CALCIUM 9.0 9.0   Lipid Panel:     Component Value Date/Time   CHOL 190 05/15/2019 1002   TRIG 276 (H) 05/15/2019 1002   HDL 31 (L) 05/15/2019 1002   CHOLHDL 6.1 05/15/2019 1002   VLDL 55 (H) 05/15/2019 1002   LDLCALC 104 (H) 05/15/2019 1002   HgbA1c:  Lab Results  Component Value Date   HGBA1C 5.5 05/15/2019   IMAGING past 48 hours EEG  Result Date: 05/15/2019 Lora Havens, MD     05/15/2019  2:11 PM Patient Name: Madison Martinez Stlouis MRN: [id number] Epilepsy Attending: Lora Havens Referring Physician/Provider: Dr. Shela Leff Date: 05/15/2019 Duration: 27.29 minutes Patient history: 86yo F who presented with AMS and dysphasia. EEG to evaluate for seizure. Level of alertness: awake AEDs during EEG study: None Technical aspects: This EEG study was done with scalp electrodes positioned according to the 10-20 International system of electrode placement. Electrical activity was  acquired at a sampling rate of 500Hz  and reviewed with a high frequency filter of 70Hz  and a low frequency filter of 1Hz . EEG data were recorded continuously and digitally stored. DESCRIPTION:  The posterior dominant rhythm consists of 9-10 Hz activity of moderate voltage (25-35 uV) seen predominantly in posterior head regions, symmetric and reactive to eye opening and eye closing. EEG showed intermittent left frontotemporal 2-3Hz  delta slowing, which at times appears sharply contoured. Photic driving was not seen during photic stimulation. Hyperventilation was not performed. ABNORMALITY - Intermittent slow, left frontotemporal IMPRESSION: This study is suggestive of non specific cortical dysfunction in left frontotemporal region. No seizures or definite epileptiform discharges were seen throughout the recording. Lora Havens   CT Abdomen Pelvis Wo Contrast  Result Date: 05/14/2019 CLINICAL DATA:  86 year old female with nausea and vomiting. EXAM: CT ABDOMEN AND PELVIS WITHOUT CONTRAST TECHNIQUE: Multidetector CT imaging of the abdomen and pelvis was performed following the standard protocol without IV contrast. COMPARISON:  None. FINDINGS: Evaluation of this exam is limited in the absence of intravenous contrast. Lower chest: The visualized lung bases are clear. No intra-abdominal free air or  free fluid. Hepatobiliary: The liver is unremarkable. No intrahepatic biliary ductal dilatation. Multiple small stones noted within the gallbladder. The gallbladder is mildly distended. No pericholecystic fluid. Ultrasound may provide better evaluation of the gallbladder if there is clinical concern for acute cholecystitis. Pancreas: Unremarkable. No pancreatic ductal dilatation or surrounding inflammatory changes. Spleen: Normal in size without focal abnormality. Adrenals/Urinary Tract: The adrenal glands are unremarkable. There is no hydronephrosis or nephrolithiasis on either side. The visualized ureters appear  unremarkable. The urinary bladder is suboptimally visualized due to streak artifact caused by bilateral hip arthroplasties. Stomach/Bowel: There is sigmoid diverticulosis without active inflammatory changes. There is diffuse inflammatory changes and thickening of the descending colon consistent with colitis. This is likely inflammatory or infectious in etiology. Ischemia is less likely but not excluded. There is no pneumatosis. Correlation with clinical exam and stool cultures recommended. There is no bowel obstruction. The appendix is normal. Vascular/Lymphatic: Advanced aortoiliac atherosclerotic disease. The IVC is grossly unremarkable. No portal venous gas. There is no adenopathy. Reproductive: The uterus is grossly unremarkable. There is a 2.5 x 2.5 cm high attenuating structure associated with the right ovary which is not characterized on this CT. This may represent normal ovarian tissue or an ovarian lesion. Evaluation of the pelvic structures is very limited due to streak artifact caused by bilateral metallic hip arthroplasties. Further evaluation with ultrasound on a nonemergent/outpatient basis recommended. Other: None Musculoskeletal: Osteopenia with degenerative changes of the spine. Bilateral hip arthroplasties. No acute osseous pathology. IMPRESSION: 1. Colitis involving the descending colon, likely inflammatory/infectious in etiology. Correlation with clinical exam and stool cultures recommended. No bowel obstruction. Normal appendix. 2. Sigmoid diverticulosis. 3. Cholelithiasis. 4. Advanced Aortic Atherosclerosis (ICD10-I70.0). Electronically Signed   By: Anner Crete M.D.   On: 05/14/2019 22:25   MR ANGIO HEAD WO CONTRAST  Result Date: 05/15/2019 CLINICAL DATA:  Initial evaluation for acute altered mental status. EXAM: MRI HEAD WITHOUT CONTRAST MRA HEAD WITHOUT CONTRAST TECHNIQUE: Multiplanar, multiecho pulse sequences of the brain and surrounding structures were obtained without intravenous  contrast. Angiographic images of the head were obtained using MRA technique without contrast. COMPARISON:  Prior CT from 05/14/2019. FINDINGS: MRI HEAD FINDINGS Brain: Moderately advanced cerebral atrophy. Patchy and confluent T2/FLAIR hyperintensity within the periventricular deep white matter both cerebral hemispheres most consistent with chronic small vessel ischemic disease, moderate to advanced in nature. Superimposed remote lacunar infarcts noted involving the bilateral basal ganglia. Additional small remote lacune noted at the central pons. Tiny remote left cerebellar infarct. Punctate 4 mm focus of restricted diffusion seen involving the cortex of the posterior left frontoparietal region (series 5, image 77). Additional tiny punctate 4-5 mm focus of restricted diffusion seen involving the cortical gray matter of the posterior left parietal lobe (series 5, image 83). Findings consistent with tiny acute ischemic infarcts, likely embolic. No associated hemorrhage or mass effect. No other evidence for acute or subacute ischemia. Gray-white matter differentiation otherwise maintained. No foci of susceptibility artifact to suggest acute or chronic intracranial hemorrhage. No mass lesion, midline shift or mass effect. Diffuse ventricular prominence related to global parenchymal volume loss without hydrocephalus. No extra-axial fluid collection. Pituitary gland within normal limits. Vascular: Absent flow void within the hypoplastic left vertebral artery, likely occluded. Major intracranial vascular flow voids otherwise maintained. Skull and upper cervical spine: Craniocervical junction within normal limits. Bone marrow signal intensity normal. No scalp soft tissue abnormality. Sinuses/Orbits: Patient status post bilateral ocular lens replacement. Opacification of the left frontoethmoidal sinuses, with moderate mucosal thickening  within the left maxillary sinus. Paranasal sinuses are otherwise clear. Trace bilateral  mastoid effusions, of doubtful significance. Inner ear structures grossly normal. Other: None. MRA HEAD FINDINGS ANTERIOR CIRCULATION: Distal cervical segments of the internal carotid arteries are patent with symmetric antegrade flow. Petrous segments widely patent bilaterally. Scattered calcified plaque throughout the cavernous/supraclinoid ICAs bilaterally. Associated moderate diffuse narrowing throughout the cavernous left ICA. Associated moderate to severe multifocal narrowing at the anterior genu of the cavernous right ICA. 5 mm focal outpouching extending laterally and slightly posteriorly from the anterior genu of the cavernous right ICA consistent with aneurysm (series 7, image 82). A1 segments patent bilaterally. Normal anterior communicating artery. A2 segments widely patent proximally. Short-segment moderate to severe distal bilateral A2 stenoses noted. There is a focal severe fairly long segment stenosis extending from the left carotid terminus into the mid left M1 segment with loss of normal flow related signal. Stenosis measures approximately 8 mm in length. Left M1 segment patent distally. Normal left MCA bifurcation. Left MCA branches well perfused distally. Right M1 irregular but widely patent. Normal right MCA bifurcation. Distal right MCA branches well perfused. POSTERIOR CIRCULATION: Dominant right vertebral artery with hypoplastic left vertebral artery. Faint attenuated flow seen within the left vertebral artery at the skull base, and remains grossly patent to the level of the left PICA left PICA perfused. Distally, left V4 segment is occluded to the vertebrobasilar junction. Dominant right vertebral artery widely patent at the skull base. Irregular approximate 60% stenosis seen involving the mid right V4 segment. Right PICA patent. Mild multifocal irregularity seen throughout the basilar artery which remains widely patent to its distal aspect. Superior cerebral arteries patent bilaterally. Both  PCAs primarily supplied via the basilar. Multifocal atheromatous irregularity without high-grade stenosis seen throughout both PCAs. IMPRESSION: MRI HEAD IMPRESSION: 1. Two punctate subcentimeter acute ischemic nonhemorrhagic cortical infarcts involving the left frontoparietal region as above, likely embolic in nature. 2. Underlying moderately advanced cerebral atrophy with chronic small vessel ischemic disease. 3. Left sided paranasal sinus disease as above. MRA HEAD IMPRESSION: 1. Approximate 8 mm severe stenosis extending from the left carotid terminus into the left M1 segment. Left MCA branches are perfused distally. 2. 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 Short-segment approximate 60% right V4 stenosis as above. 3. Extensive atheromatous change throughout the carotid siphons with associated moderate to advanced multifocal stenoses. 4. 5 mm cavernous right ICA aneurysm as above. Electronically Signed   By: Jeannine Boga M.D.   On: 05/15/2019 02:37   MR BRAIN WO CONTRAST  Result Date: 05/15/2019 CLINICAL DATA:  Initial evaluation for acute altered mental status. EXAM: MRI HEAD WITHOUT CONTRAST MRA HEAD WITHOUT CONTRAST TECHNIQUE: Multiplanar, multiecho pulse sequences of the brain and surrounding structures were obtained without intravenous contrast. Angiographic images of the head were obtained using MRA technique without contrast. COMPARISON:  Prior CT from 05/14/2019. FINDINGS: MRI HEAD FINDINGS Brain: Moderately advanced cerebral atrophy. Patchy and confluent T2/FLAIR hyperintensity within the periventricular deep white matter both cerebral hemispheres most consistent with chronic small vessel ischemic disease, moderate to advanced in nature. Superimposed remote lacunar infarcts noted involving the bilateral basal ganglia. Additional small remote lacune noted at the central pons. Tiny remote left cerebellar infarct. Punctate 4 mm focus of restricted  diffusion seen involving the cortex of the posterior left frontoparietal region (series 5, image 77). Additional tiny punctate 4-5 mm focus of restricted diffusion seen involving the cortical gray matter of the posterior  left parietal lobe (series 5, image 83). Findings consistent with tiny acute ischemic infarcts, likely embolic. No associated hemorrhage or mass effect. No other evidence for acute or subacute ischemia. Gray-white matter differentiation otherwise maintained. No foci of susceptibility artifact to suggest acute or chronic intracranial hemorrhage. No mass lesion, midline shift or mass effect. Diffuse ventricular prominence related to global parenchymal volume loss without hydrocephalus. No extra-axial fluid collection. Pituitary gland within normal limits. Vascular: Absent flow void within the hypoplastic left vertebral artery, likely occluded. Major intracranial vascular flow voids otherwise maintained. Skull and upper cervical spine: Craniocervical junction within normal limits. Bone marrow signal intensity normal. No scalp soft tissue abnormality. Sinuses/Orbits: Patient status post bilateral ocular lens replacement. Opacification of the left frontoethmoidal sinuses, with moderate mucosal thickening within the left maxillary sinus. Paranasal sinuses are otherwise clear. Trace bilateral mastoid effusions, of doubtful significance. Inner ear structures grossly normal. Other: None. MRA HEAD FINDINGS ANTERIOR CIRCULATION: Distal cervical segments of the internal carotid arteries are patent with symmetric antegrade flow. Petrous segments widely patent bilaterally. Scattered calcified plaque throughout the cavernous/supraclinoid ICAs bilaterally. Associated moderate diffuse narrowing throughout the cavernous left ICA. Associated moderate to severe multifocal narrowing at the anterior genu of the cavernous right ICA. 5 mm focal outpouching extending laterally and slightly posteriorly from the anterior genu  of the cavernous right ICA consistent with aneurysm (series 7, image 82). A1 segments patent bilaterally. Normal anterior communicating artery. A2 segments widely patent proximally. Short-segment moderate to severe distal bilateral A2 stenoses noted. There is a focal severe fairly long segment stenosis extending from the left carotid terminus into the mid left M1 segment with loss of normal flow related signal. Stenosis measures approximately 8 mm in length. Left M1 segment patent distally. Normal left MCA bifurcation. Left MCA branches well perfused distally. Right M1 irregular but widely patent. Normal right MCA bifurcation. Distal right MCA branches well perfused. POSTERIOR CIRCULATION: Dominant right vertebral artery with hypoplastic left vertebral artery. Faint attenuated flow seen within the left vertebral artery at the skull base, and remains grossly patent to the level of the left PICA left PICA perfused. Distally, left V4 segment is occluded to the vertebrobasilar junction. Dominant right vertebral artery widely patent at the skull base. Irregular approximate 60% stenosis seen involving the mid right V4 segment. Right PICA patent. Mild multifocal irregularity seen throughout the basilar artery which remains widely patent to its distal aspect. Superior cerebral arteries patent bilaterally. Both PCAs primarily supplied via the basilar. Multifocal atheromatous irregularity without high-grade stenosis seen throughout both PCAs. IMPRESSION: MRI HEAD IMPRESSION: 1. Two punctate subcentimeter acute ischemic nonhemorrhagic cortical infarcts involving the left frontoparietal region as above, likely embolic in nature. 2. Underlying moderately advanced cerebral atrophy with chronic small vessel ischemic disease. 3. Left sided paranasal sinus disease as above. MRA HEAD IMPRESSION: 1. Approximate 8 mm severe stenosis extending from the left carotid terminus into the left M1 segment. Left MCA branches are perfused  distally. 2. 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 Short-segment approximate 60% right V4 stenosis as above. 3. Extensive atheromatous change throughout the carotid siphons with associated moderate to advanced multifocal stenoses. 4. 5 mm cavernous right ICA aneurysm as above. Electronically Signed   By: Jeannine Boga M.D.   On: 05/15/2019 02:37   US RENAL  Result Date: 05/15/2019 CLINICAL DATA:  Acute kidney injury EXAM: RENAL / URINARY TRACT ULTRASOUND COMPLETE COMPARISON:  CT abdomen pelvis 05/14/2019 FINDINGS: Right Kidney: Renal  measurements: 8.6 x 5.3 x 3.1 cm = volume: 73.8 mL . Echogenicity within normal limits. No worrisome mass, shadowing calculus or hydronephrosis visualized. Left Kidney: Renal measurements: 9.4 x 5.3 x 3.6 cm = volume: 94 mL. 0.8 x 0.6 x 0.6 cm anechoic cystic lesion in the interpolar left kidney most compatible with simple renal cyst corresponding well to a partially exophytic cyst on CT. No worrisome renal lesion. No shadowing calculus or hydronephrosis. Bladder: Appears normal for degree of bladder distention. Other: None. IMPRESSION: Anechoic 8 mm likely simple cyst in the left kidney. Otherwise unremarkable renal ultrasound. Electronically Signed   By: Lovena Le M.D.   On: 05/15/2019 03:49   DG Chest Port 1 View  Result Date: 05/14/2019 CLINICAL DATA:  86 year old female with nonspecific chest pain. EXAM: PORTABLE CHEST 1 VIEW COMPARISON:  None. FINDINGS: There is no focal consolidation, pleural effusion or pneumothorax. The cardiac silhouette is within normal limits. Small left hilar calcified granuloma. Atherosclerotic calcification of the aorta. No acute osseous pathology. IMPRESSION: No active disease. Electronically Signed   By: Anner Crete M.D.   On: 05/14/2019 21:17   ECHOCARDIOGRAM COMPLETE  Result Date: 05/15/2019    ECHOCARDIOGRAM REPORT   Patient Name:   LESLEE HOLTON Date of Exam: 05/15/2019 Medical  Rec #:  [id number]   Height:       64.5 in Accession #:    [id number]  Weight:       143.7 lb Date of Birth:  06/20/1927   BSA:          1.709 m Patient Age:    69 years    BP:           138/52 mmHg Patient Gender: F           HR:           68 bpm. Exam Location:  Inpatient Procedure: 2D Echo Indications:    Stroke I163.9  History:        Patient has no prior history of Echocardiogram examinations.                 Risk Factors:Diabetes, Hypertension and Dyslipidemia.  Sonographer:    Mikki Santee RDCS (AE) Referring Phys: [id number] [place]  1. There is a mass (0.8 cm x 0.5 cm) that appears to be attached to the right coronary cusp of the aortic valve. It does appear calcified and is best seen in the PLAX views. There is no destruction of the valve to suggest vegetation/endocarditis. 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 A TEE is recommended for better characterization in this patient with stroke. The aortic valve is tricuspid. Aortic valve regurgitation is not visualized. No aortic stenosis is present.  2. Left ventricular ejection fraction, by estimation, is 60 to 65%. The left ventricle has normal function. The left ventricle has no regional wall motion abnormalities. Left ventricular diastolic parameters are indeterminate.  3. Right ventricular systolic function is normal. The right ventricular size is normal. There is normal pulmonary artery systolic pressure. The estimated right ventricular systolic pressure is [id number] mmHg.  4. The mitral valve is degenerative. Mild mitral valve regurgitation. No evidence of mitral stenosis.  5. The inferior vena cava is normal in size with greater than 50% respiratory variability, suggesting right atrial pressure of 3 mmHg. Conclusion(s)/Recommendation(s): Findings concerning for aortic valve mass, would recommend Transesophageal Echocardiogram for clarification. FINDINGS  Left Ventricle: Left ventricular ejection fraction,  by estimation, is 60 to 65%.  The left ventricle has normal function. The left ventricle has no regional wall motion abnormalities. The left ventricular internal cavity size was normal in size. There is  no left ventricular hypertrophy. Left ventricular diastolic parameters are indeterminate. Right Ventricle: The right ventricular size is normal. No increase in right ventricular wall thickness. Right ventricular systolic function is normal. There is normal pulmonary artery systolic pressure. The tricuspid regurgitant velocity is 2.59 m/s, and  with an assumed right atrial pressure of 3 mmHg, the estimated right ventricular systolic pressure is [id number] mmHg. Left Atrium: Left atrial size was normal in size. Right Atrium: Right atrial size was normal in size. Pericardium: There is no evidence of pericardial effusion. Presence of pericardial fat pad. Mitral Valve: The mitral valve is degenerative in appearance. Mild mitral annular calcification. Mild mitral valve regurgitation. No evidence of mitral valve stenosis. Tricuspid Valve: The tricuspid valve is grossly normal. Tricuspid valve regurgitation is mild . No evidence of tricuspid stenosis. Aortic Valve: There is a mass (0.8 cm x 0.5 cm) that appears to be attached to the right coronary cusp of the aortic valve. It does appear calcified and is best seen in the PLAX views. There is no destruction of the valve to suggest vegetation/endocarditis. 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 A TEE is recommended for better characterization in this patient with stroke. The aortic valve is tricuspid. Aortic valve regurgitation is not visualized. No aortic stenosis is present. Pulmonic Valve: The pulmonic valve was grossly normal. Pulmonic valve regurgitation is trivial. Aorta: The aortic root is normal in size and structure. Venous: The inferior vena cava is normal in size with greater than 50% respiratory variability, suggesting right atrial pressure  of 3 mmHg. IAS/Shunts: No atrial level shunt detected by color flow Doppler.  LEFT VENTRICLE PLAX 2D LVIDd:         4.80 cm  Diastology LVIDs:         3.20 cm  LV e' lateral:   5.77 cm/s LV PW:         0.90 cm  LV E/e' lateral: 14.4 LV IVS:        1.10 cm  LV e' medial:    5.55 cm/s LVOT diam:     2.00 cm  LV E/e' medial:  14.9 LV SV:         56 LV SV Index:   33 LVOT Area:     3.14 cm  RIGHT VENTRICLE RV Basal diam:  3.40 cm RV Mid diam:    3.40 cm RV S prime:     13.60 cm/s TAPSE (M-mode): 2.2 cm LEFT ATRIUM             Index       RIGHT ATRIUM           Index LA diam:        3.50 cm 2.05 cm/m  RA Area:     16.80 cm LA Vol (A2C):   40.9 ml 23.93 ml/m RA Volume:   47.50 ml  27.79 ml/m LA Vol (A4C):   57.6 ml 33.70 ml/m LA Biplane Vol: 48.8 ml 28.55 ml/m  AORTIC VALVE LVOT Vmax:   84.90 cm/s LVOT Vmean:  54.700 cm/s LVOT VTI:    0.177 m  AORTA Ao Root diam: 2.50 cm MITRAL VALVE                TRICUSPID VALVE MV Area (PHT): 2.50 cm     TR Peak grad:  26.8 mmHg MV Decel Time: 303 msec     TR Vmax:        259.00 cm/s MV E velocity: 82.90 cm/s MV A velocity: 111.00 cm/s  SHUNTS MV E/A ratio:  0.75         Systemic VTI:  0.18 m                             Systemic Diam: 2.00 cm Eleonore Chiquito MD Electronically signed by Eleonore Chiquito MD Signature Date/Time: 05/15/2019/2:03:10 PM    Final    CT HEAD CODE STROKE WO CONTRAST  Result Date: 05/14/2019 CLINICAL DATA:  Code stroke.  Altered mental status EXAM: CT HEAD WITHOUT CONTRAST TECHNIQUE: Contiguous axial images were obtained from the base of the skull through the vertex without intravenous contrast. COMPARISON:  None. FINDINGS: Brain: Generalized atrophy, moderate in degree. Moderate hypodensity throughout the cerebral white matter extending into the left internal capsule compatible chronic ischemia. Negative for acute hemorrhage or mass. No midline shift. Vascular: Atherosclerotic calcification bilaterally. Negative for hyperdense vessel Skull: Negative  Sinuses/Orbits: Complete opacification left maxillary sinus with mucosal edema extending into the left frontal and ethmoid sinus. No air-fluid levels. Left maxillary sinus secretions are mildly hyperdense. Negative for orbital mass.  Bilateral cataract extraction Other: None ASPECTS ([place] Stroke Program Early CT Score) - Ganglionic level infarction (caudate, lentiform nuclei, internal capsule, insula, M1-M3 cortex): 7 - Supraganglionic infarction (M4-M6 cortex): 3 Total score (0-10 with 10 being normal): 10 IMPRESSION: 1. No acute abnormality 2. ASPECTS is 10 3. Moderate atrophy and moderate chronic microvascular ischemic change 4. These results were called by telephone at the time of interpretation on 05/14/2019 at 8:27 pm to provider Lindzen , who verbally acknowledged these results. Electronically Signed   By: Franchot Gallo M.D.   On: 05/14/2019 20:28   VAS US CAROTID (at [facility] and [facility] only)  Result Date: 05/15/2019 Carotid Arterial Duplex Study Indications:       CVA. Risk Factors:      Hypertension, hyperlipidemia, Diabetes. Limitations        Today's exam was limited due to patient movement. Comparison Study:  no prior Performing Technologist: Abram Sander RVS  Examination Guidelines: A complete evaluation includes B-mode imaging, spectral Doppler, color Doppler, and power Doppler as needed of all accessible portions of each vessel. Bilateral testing is considered an integral part of a complete examination. Limited examinations for reoccurring indications may be performed as noted.  Right Carotid Findings: +----------+--------+--------+--------+-------------------------+---------+           PSV cm/sEDV cm/sStenosisPlaque Description       Comments  +----------+--------+--------+--------+-------------------------+---------+ CCA Prox  44      9               heterogenous                       +----------+--------+--------+--------+-------------------------+---------+ CCA Distal57      12               heterogenous             Shadowing +----------+--------+--------+--------+-------------------------+---------+ ICA Prox  91      29      1-39%   heterogenous and calcificShadowing +----------+--------+--------+--------+-------------------------+---------+ ICA Distal95      35                                                 +----------+--------+--------+--------+-------------------------+---------+  ECA       177                                                        +----------+--------+--------+--------+-------------------------+---------+ +----------+--------+-------+--------+-------------------+           PSV cm/sEDV cmsDescribeArm Pressure (mmHG) +----------+--------+-------+--------+-------------------+ [id number]                                         +----------+--------+-------+--------+-------------------+ +---------+--------+--+--------+--+---------+ VertebralPSV cm/s68EDV cm/s23Antegrade +---------+--------+--+--------+--+---------+  Left Carotid Findings: +----------+--------+--------+--------+------------------------------+---------+           PSV cm/sEDV cm/sStenosisPlaque Description            Comments  +----------+--------+--------+--------+------------------------------+---------+ CCA Prox  38      8               heterogenous                            +----------+--------+--------+--------+------------------------------+---------+ CCA Distal50      10              heterogenous                            +----------+--------+--------+--------+------------------------------+---------+ ICA Prox  69      25      1-39%   heterogenous, calcific and    Shadowing                                   irregular                               +----------+--------+--------+--------+------------------------------+---------+ ICA Distal29      12                                                       +----------+--------+--------+--------+------------------------------+---------+ ECA       99                                                              +----------+--------+--------+--------+------------------------------+---------+ +----------+--------+--------+--------+-------------------+           PSV cm/sEDV cm/sDescribeArm Pressure (mmHG) +----------+--------+--------+--------+-------------------+ Subclavian80                                          +----------+--------+--------+--------+-------------------+ +---------+--------+--+--------+---------+ VertebralPSV cm/s33EDV cm/sAntegrade +---------+--------+--+--------+---------+   Summary: Right Carotid: Velocities in the right ICA are consistent with a 1-39% stenosis. Left Carotid: Velocities in the left ICA are consistent with a 1-39% stenosis. Vertebrals: Bilateral vertebral arteries demonstrate antegrade flow. *See table(s) above for measurements and observations.  Preliminary     PHYSICAL EXAM   Temp:  [98 F (36.7 C)-98.9 F (37.2 C)] 98 F (36.7 C) (02/25 1237) Pulse Rate:  [62-71] 71 (02/25 1237) Resp:  [18] 18 (02/25 1237) BP: (138-162)/(60-77) 141/60 (02/25 1237) SpO2:  [94 %-99 %] 95 % (02/25 1237)  General - Well nourished, well developed, in no apparent distress.  Ophthalmologic - fundi not visualized due to noncooperation.  Cardiovascular - Regular rhythm and rate.  Mental Status -  Level of arousal and orientation to time, place, and person were intact. Language including expression, naming, repetition, comprehension was assessed and found intact.  Cranial Nerves II - XII - II - Visual field intact OU.  Left eye legally blind, able to see LP and close distance HW III, IV, VI - Extraocular movements intact. V - Facial sensation intact bilaterally. VII - Facial movement intact bilaterally. VIII - hard of hearing & vestibular intact bilaterally. X - Palate elevates symmetrically. XI -  Chin turning & shoulder shrug intact bilaterally. XII - Tongue protrusion intact.  Motor Strength - The patient's strength was symmetrical in all extremities and pronator drift was absent.  Bulk was normal and fasciculations were absent.   Motor Tone - Muscle tone was assessed at the neck and appendages and was normal.  Reflexes - The patient's reflexes were symmetrical in all extremities and she had no pathological reflexes.  Sensory - Light touch, temperature/pinprick were assessed and were symmetrical.    Coordination - The patient had normal movements in the hands with no ataxia or dysmetria.  Tremor was absent.  Gait and Station - deferred.   ASSESSMENT/PLAN [name] is a 86 y.o. female with history of HTN, HLD, DM2, spinal stenosis, OS glaucoma, hx BRVO presenting with confusion and aphasia after being found down.  Stroke:   2 frontoparietal cortical infarcts due to hypoperfusion in setting of dehydration and AKI wiht left tICA and MCA severe stenosis, although cardioembolic source can not be completely ruled out. Of note, pt does have mobile mass at right AV  Code Stroke CT head No acute abnormality. Moderate atrophy and small vessel disease. ASPECTS 10.     MRI  2 punctate L frontoparietal cortical infarcts. Moderate advanced Small vessel disease and Atrophy. L paranasal sinus dz.   MRA  Severe L ICA terminus to L M1 stenosis. L V4 occlusion beyond takeoff of L PICA. R V4 60% stenosis. Extensive atherosclerosis B ICA siphons w/ moderate to advanced focal stenoses. R ICA 2mm aneurysm.   Carotid Doppler  B ICA 1-39% stenosis, VAs antegrade   2D Echo EF 60-65%. Right AV mobile mass  EEG left frontal temporal intermittent slowing, no seizure  LDL 104  HgbA1c 5.5  Heparin 5000 units sq tid for VTE prophylaxis  aspirin 81 mg daily prior to admission, now on aspirin 325 mg daily and plavix DAPT for intracranial stenosis. Further regimen will depends on TEE finding  tomorrow  Therapy recommendations:  HH PT, HH OT, HH SLP   Disposition:  pending   Intracranial stenosis  MRA showed severe left terminal ICA and M1 stenosis, extensive atherosclerosis bilateral ICA siphon with moderate to advanced focal stenosis  Chronic in nature, no intervention needed at this time  On DAPT for maximal medical treatment  Right aortic valve mobile mass  TTE showed a mass (0.8 cm x 0.5 cm) that appears to be attached to the  right coronary cusp of the aortic valve. 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  TEE pending in am  Further antithrombotic regimen depends on TEE finding tomorrow  Dehydration and AKI  Cre baseline 1.13  This admission creatinine 2.96->3.10->1.92->1.44   Sodium 130->137->135   WBC 23.1->17.4   Off IV fluid  Acute infectious versus inflammatory colitis  CT abdomen pelvis showed involvement of the descending colon  Leukocytosis, WBC 23.1->17.4   On Rocephin and Flagyl  Off IV fluid now  Hypertension  Stable . Gradually normalize to goal in 3-5 days . Long-term BP goal 130-150 given severe intracranial stenosis  Hyperlipidemia  Home meds:  pravachol 10, resumed in hospital  LDL 104, goal < 70  Increase Pravachol to 40  Continue statin at discharge  Diabetes type II Controlled  HgbA1c 5.5, goal < 7.0  CBGs  SSI  Close PCP follow-up  Other Stroke Risk Factors  Advanced age  Other Active Problems  Hyponatremia, resolved, sodium 130-137-135   R ICA 77mm aneurysm - follow up in neurology as [facility]  Rosalin Hawking, MD PhD Stroke Neurology 05/16/2019 1:18 PM   To contact Stroke Continuity provider, please refer to [email or web address] After hours, contact General Neurology

## 2019-05-16 NOTE — Progress Notes (Addendum)
PROGRESS NOTE    Madison Martinez  [id number] DOB: 10/18/1927 DOA: 05/14/2019 PCP: Ma Hillock, DO    Brief Narrative:  Patient was admitted to the hospital with a working diagnosis of toxic/metabolic encephalopathy, in the setting of acute colitis, rule out CVA.   86 year old female who presented with dysphagia and altered mental status.  She does have significant past medical history for left eye blindness, hypoacusia, ambulatory dysfunction, hypertension, dyslipidemia, type 2 diabetes mellitus, spinal stenosis and polyneuropathy.  At baseline patient independent. 02/23 patient was found confused, unable to follow commands, positive aphasia.  On her initial physical examination blood pressure 114/54, heart rate 80, respiratory rate 23, oxygen saturation 92%, her lungs were clear to auscultation bilaterally, heart S1-S2 present rhythmic, abdomen soft, mild tender to palpation, no lower extremity edema.  Patient was awake and alert, she was disoriented, but nonfocal. Sodium 130, potassium 4.1, chloride 96, bicarb 22, glucose 96, BUN 48, creatinine 2.96, white count 23.1, hemoglobin 12.4, hematocrit 37.3, platelets 198.  SARS COVID-19 negative.  EKG 63 bpm, normal axis, right bundle branch block, sinus rhythm, PACs, no ST segment or T wave changes.  CT head with positive atrophy, no acute changes.  CT of the abdomen positive for colitis involving the descending colon, sigmoid diverticulosis.  Cholelithiasis.  Patient has been placed on IV antibiotic therapy, intravenous fluids and placed on stroke workup protocol.   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 Brain MRI 2 punctuate subcentimiter acute non hemorrhagic cortical infarcts on the left frontotemporal region.   Requested TEE    Assessment & Plan:   Principal Problem:   Aortic valve mass Active Problems:   AKI (acute kidney injury) ([place])   Hyponatremia   AMS (altered mental status)   Colitis   Generalized weakness   CVA (cerebral vascular accident) ([place])    1. Acute metabolic/ toxic (gabapentin) encephalopathy in the setting of acute CVA (present on admission). Patient continue to improve mentation, this am is out of bed to chair.   Brain MRI with positive acute subcentimiter cortical infarcts left fronto parietal likely embolic and echocardiogram with positive aortic valve mass (0,8 to 0.5 cm). Positive brain vascular atherosclerosis and brain atrophy. Carotids with no significant stenosis.   Will continue dual antiplatelet therapy with asa and clopidogrel, blood pressure control with metoprolol. Consulted cardiology for TEE.   2. Acute colitis/ likely ischemic/ embolic. Will continue with IV antibiotic therapy with ceftriaxone and po metronidazole. Tolerating po well.  3. AKI on CKD stage 3a with hyponatremia. Renal function with serum cr down to 1,44 with K at 4,0 and serum bicarbonate at 20. Continue to hold on IV fluids and follow renal panel in am.  4. Controlled T2DM (HgA1c 5,5)/ dyslipidemia. Fasting glucose this am is 109, will continue to hold on insulin for now.  On pravastatin for dyslipidemia.   5. HTN. Blood pressure control with metoprolol.      DVT prophylaxis: enoxaparin   Code Status:  full Family Communication: I spoke with patient's son at the bedside, we talked in detail about patient's condition, plan of care and prognosis and all questions were addressed.    Consultants:   Neurology   Cardiology TEE      Antimicrobials:   Ceftriaxone and metronidazole.     Subjective: Patient is feeling better, continue to have mild hematochezia, but denies any frank abdominal pain, no nausea or vomiting, no chest pain  or dyspnea.   Objective: Vitals:   05/15/19 1934 05/15/19 2323 05/16/19 0314 05/16/19 0720  BP: (!) 155/63 139/62 (!) 151/62 138/77  Pulse: 63 66 70 71  Resp: 18 18 18 18    Temp: 98.5 F (36.9 C) 98.3 F (36.8 C) 98.1 F (36.7 C) 98.8 F (37.1 C)  TempSrc: Oral Oral Oral Oral  SpO2: 99% 99% 99% 94%  Weight:      Height:        Intake/Output Summary (Last 24 hours) at 05/16/2019 0951 Last data filed at 05/16/2019 0315 Gross per 24 hour  Intake 1188.23 ml  Output 1200 ml  Net -11.77 ml   Filed Weights   05/14/19 2045  Weight: 65.2 kg    Examination:   General: Not in pain or dyspnea.  Neurology: Awake and alert, non focal  E ENT: mild pallor, no icterus, oral mucosa moist Cardiovascular: No JVD. S1-S2 present, rhythmic, no gallops, rubs, or murmurs. No lower extremity edema. Pulmonary: positive breath sounds bilaterally, adequate air movement, no wheezing, rhonchi or rales. Gastrointestinal. Abdomen with no organomegaly, non tender, no rebound or guarding Skin. No rashes Musculoskeletal: no joint deformities     Data Reviewed: I have personally reviewed following labs and imaging studies  CBC: Recent Labs  Lab 05/14/19 2012 05/14/19 2021 05/15/19 1002  WBC 23.1*  --  17.4*  NEUTROABS 14.8*  --   --   HGB 12.4 12.2 11.7*  HCT 37.3 36.0 36.4  MCV 95.2  --  95.8  PLT 198  --  [id number]   Basic Metabolic Panel: Recent Labs  Lab 05/14/19 2012 05/14/19 2021 05/15/19 1002 05/16/19 0028  NA 130* 130* 137 135  K 4.1 4.1 3.7 4.0  CL 96* 96* 105 105  CO2 22  --  22 20*  GLUCOSE 96 92 129* 109*  BUN 48* 45* 36* 29*  CREATININE 2.96* 3.10* 1.92* 1.44*  CALCIUM 9.2  --  9.0 9.0   GFR: Estimated Creatinine Clearance: 23.4 mL/min (A) (by C-G formula based on SCr of 1.44 mg/dL (H)). Liver Function Tests: Recent Labs  Lab 05/14/19 2012  AST 30  ALT 16  ALKPHOS 68  BILITOT 0.6  PROT 6.6  ALBUMIN 3.4*   No results for input(s): LIPASE, AMYLASE in the last 168 hours. Recent Labs  Lab 05/14/19 2325  AMMONIA 18   Coagulation Profile: Recent Labs  Lab 05/14/19 2012  INR 1.1   Cardiac Enzymes: Recent Labs  Lab 05/15/19 1002   CKTOTAL 117   BNP (last 3 results) No results for input(s): PROBNP in the last 8760 hours. HbA1C: Recent Labs    05/15/19 1002  HGBA1C 5.5   CBG: Recent Labs  Lab 05/14/19 2011 05/15/19 0932 05/15/19 1228  GLUCAP 91 87 105*   Lipid Profile: Recent Labs    05/15/19 1002  CHOL 190  HDL 31*  LDLCALC 104*  TRIG 276*  CHOLHDL 6.1   Thyroid Function Tests: Recent Labs    05/14/19 2305 05/15/19 1002  TSH 4.982*  --   FREET4  --  0.80   Anemia Panel: Recent Labs    05/14/19 2305  [id number]      Radiology Studies: I have reviewed all of the imaging during this hospital visit personally     Scheduled Meds: . aspirin EC  325 mg Oral Daily  . cholecalciferol  1,000 Units Oral Daily  . clopidogrel  75 mg Oral Daily  . dorzolamide-timolol  1 drop Right Eye BID  .  gabapentin  300 mg Oral BID  . Gerhardt's butt cream   Topical QID  . heparin  5,000 Units Subcutaneous Q8H  . latanoprost  1 drop Left Eye QHS  . metoprolol succinate  25 mg Oral Daily  . multivitamin with minerals  1 tablet Oral Daily  . pravastatin  40 mg Oral q1800  . saccharomyces boulardii  250 mg Oral BID   Continuous Infusions: . cefTRIAXone (ROCEPHIN)  IV 2 g (05/16/19 0520)  . metronidazole 500 mg (05/16/19 0841)     LOS: 1 Jech        Chan Rosasco Gerome Apley, MD

## 2019-05-16 NOTE — Progress Notes (Signed)
Physical Therapy Treatment Patient Details Name: Madison Martinez MRN: [id number] DOB: 1927/09/21 Today's Date: 05/16/2019    History of Present Illness Patient is an 86 year old female admitted from home with AMS. Fall prior to admission. MRI reveals 2 subcentimeter acute ashemic cortical infarcts. PMH includes: Blind in L eye, HOH, HTN, HLD, DM    PT Comments    Pt tolerated increased ambulation distance of 180' with RW, no loss of balance, verbal cues for safe positioning in RW, HR 80s. Bloody drainage noted on pt's bed pad and in commode after pt used bathroom, RN notified. Pt is pleasant and puts forth good effort.    Follow Up Recommendations  Home health PT;Supervision for mobility/OOB     Equipment Recommendations  None recommended by PT    Recommendations for Other Services       Precautions / Restrictions Precautions Precautions: Fall Precaution Comments: HOH Restrictions Weight Bearing Restrictions: No    Mobility  Bed Mobility Overal bed mobility: Needs Assistance Bed Mobility: Supine to Sit     Supine to sit: Min assist     General bed mobility comments: assist for trunk elevation  Transfers Overall transfer level: Needs assistance Equipment used: Rolling walker (2 wheeled) Transfers: Sit to/from Stand Sit to Stand: Min assist;From elevated surface;Mod assist         General transfer comment: unsteadiness with initial standing balance, posterior lean. Min A from elevated bed, mod A from low commode  Ambulation/Gait Ambulation/Gait assistance: Min guard Gait Distance (Feet): 180 Feet Assistive device: Rolling walker (2 wheeled) Gait Pattern/deviations: Step-through pattern;Decreased stride length;Trunk flexed Gait velocity: WFL   General Gait Details: steady, HR 80s with walking, no loss of balance, VCs to step closer to [employer]    Modified Rankin (Stroke Patients Only)       Balance Overall  balance assessment: Needs assistance Sitting-balance support: Feet supported Sitting balance-Leahy Scale: Fair     Standing balance support: Bilateral upper extremity supported;During functional activity Standing balance-Leahy Scale: Poor Standing balance comment: reliant on RW and external assist for safety                            Cognition Arousal/Alertness: Awake/alert Behavior During Therapy: WFL for tasks assessed/performed Overall Cognitive Status: Within Functional Limits for tasks assessed                                        Exercises      General Comments        Pertinent Vitals/Pain Pain Assessment: No/denies pain    Home Living                      Prior Function            PT Goals (current goals can now be found in the care plan section) Acute Rehab PT Goals Patient Stated Goal: to return home, feel better PT Goal Formulation: With patient Time For Goal Achievement: 05/29/19 Potential to Achieve Goals: Good Progress towards PT goals: Progressing toward goals    Frequency    Min 4X/week      PT Plan Current plan remains appropriate    Co-evaluation  AM-PAC PT "6 Clicks" Mobility   Outcome Measure  Help needed turning from your back to your side while in a flat bed without using bedrails?: A Little Help needed moving from lying on your back to sitting on the side of a flat bed without using bedrails?: A Little Help needed moving to and from a bed to a chair (including a wheelchair)?: A Little Help needed standing up from a chair using your arms (e.g., wheelchair or bedside chair)?: A Little Help needed to walk in hospital room?: A Little Help needed climbing 3-5 steps with a railing? : A Lot 6 Click Score: 17    End of Session Equipment Utilized During Treatment: Gait belt Activity Tolerance: Patient tolerated treatment well Patient left: with call bell/phone within reach;in  chair;with chair alarm set Nurse Communication: Mobility status PT Visit Diagnosis: Unsteadiness on feet (R26.81);Difficulty in walking, not elsewhere classified (R26.2);Muscle weakness (generalized) (M62.81);History of falling (Z91.81);Pain Pain - Right/Left: (bilateral) Pain - part of body: Ankle and joints of foot     Time: [id number] PT Time Calculation (min) (ACUTE ONLY): 30 min  Charges:  $Gait Training: 8-22 mins $Therapeutic Activity: 8-22 mins                    Blondell Reveal Kistler PT 05/16/2019  Acute Rehabilitation Services Pager [phone] Office [phone]

## 2019-05-17 ENCOUNTER — Encounter (HOSPITAL_COMMUNITY)
Admission: EM | Disposition: A | Discharge status: Home Health | Payer: Self-pay | Source: Home / Self Care | Attending: Internal Medicine

## 2019-05-17 ENCOUNTER — Inpatient Hospital Stay (HOSPITAL_COMMUNITY): Payer: Medicare Other | Admitting: Anesthesiology

## 2019-05-17 ENCOUNTER — Encounter (HOSPITAL_COMMUNITY): Payer: Self-pay | Admitting: Internal Medicine

## 2019-05-17 ENCOUNTER — Inpatient Hospital Stay (HOSPITAL_COMMUNITY): Payer: Medicare Other

## 2019-05-17 DIAGNOSIS — I340 Nonrheumatic mitral (valve) insufficiency: Secondary | ICD-10-CM

## 2019-05-17 DIAGNOSIS — I358 Other nonrheumatic aortic valve disorders: Secondary | ICD-10-CM

## 2019-05-17 DIAGNOSIS — I63322 Cerebral infarction due to thrombosis of left anterior cerebral artery: Secondary | ICD-10-CM

## 2019-05-17 DIAGNOSIS — I639 Cerebral infarction, unspecified: Secondary | ICD-10-CM

## 2019-05-17 HISTORY — PX: BUBBLE STUDY: SHX6837

## 2019-05-17 HISTORY — PX: TEE WITHOUT CARDIOVERSION: SHX5443

## 2019-05-17 LAB — CBC WITH DIFFERENTIAL/PLATELET
Abs Immature Granulocytes: 0.06 10*3/uL (ref 0.00–0.07)
Basophils Absolute: 0.1 10*3/uL (ref 0.0–0.1)
Basophils Relative: 1 %
Eosinophils Absolute: 0.3 10*3/uL (ref 0.0–0.5)
Eosinophils Relative: 3 %
HCT: 34.6 % — ABNORMAL LOW (ref 36.0–46.0)
Hemoglobin: 11.4 g/dL — ABNORMAL LOW (ref 12.0–15.0)
Immature Granulocytes: 1 %
Lymphocytes Relative: 44 %
Lymphs Abs: 5.5 10*3/uL — ABNORMAL HIGH (ref 0.7–4.0)
MCH: 31.1 pg (ref 26.0–34.0)
MCHC: 32.9 g/dL (ref 30.0–36.0)
MCV: 94.3 fL (ref 80.0–100.0)
Monocytes Absolute: 0.9 10*3/uL (ref 0.1–1.0)
Monocytes Relative: 7 %
Neutro Abs: 5.3 10*3/uL (ref 1.7–7.7)
Neutrophils Relative %: 44 %
Platelets: 174 10*3/uL (ref 150–400)
RBC: 3.67 MIL/uL — ABNORMAL LOW (ref 3.87–5.11)
RDW: 13.2 % (ref 11.5–15.5)
WBC: 12.1 10*3/uL — ABNORMAL HIGH (ref 4.0–10.5)
nRBC: 0 % (ref 0.0–0.2)

## 2019-05-17 LAB — BASIC METABOLIC PANEL
Anion gap: 10 (ref 5–15)
BUN: 24 mg/dL — ABNORMAL HIGH (ref 8–23)
CO2: 23 mmol/L (ref 22–32)
Calcium: 9.8 mg/dL (ref 8.9–10.3)
Chloride: 109 mmol/L (ref 98–111)
Creatinine, Ser: 1.27 mg/dL — ABNORMAL HIGH (ref 0.44–1.00)
GFR calc Af Amer: 43 mL/min — ABNORMAL LOW (ref >60)
GFR calc non Af Amer: 37 mL/min — ABNORMAL LOW (ref >60)
Glucose, Bld: 131 mg/dL — ABNORMAL HIGH (ref 70–99)
Potassium: 4.7 mmol/L (ref 3.5–5.1)
Sodium: 142 mmol/L (ref 135–145)

## 2019-05-17 SURGERY — ECHOCARDIOGRAM, TRANSESOPHAGEAL
Anesthesia: Monitor Anesthesia Care

## 2019-05-17 MED ORDER — AMOXICILLIN-POT CLAVULANATE 500-125 MG PO TABS
1.0000 | ORAL_TABLET | Freq: Two times a day (BID) | ORAL | Status: DC
  Administered 2019-05-17 – 2019-05-18 (×2): 500 mg via ORAL
  Filled 2019-05-17 (×4): qty 1

## 2019-05-17 MED ORDER — PROPOFOL 500 MG/50ML IV EMUL
INTRAVENOUS | Status: DC | PRN
  Administered 2019-05-17: 100 ug/kg/min via INTRAVENOUS

## 2019-05-17 MED ORDER — LACTATED RINGERS IV SOLN
INTRAVENOUS | Status: AC | PRN
  Administered 2019-05-17: 1000 mL via INTRAVENOUS

## 2019-05-17 MED ORDER — AMOXICILLIN-POT CLAVULANATE 875-125 MG PO TABS: 1.0000 | ORAL_TABLET | Freq: Two times a day (BID) | ORAL | Status: DC

## 2019-05-17 MED ORDER — LACTATED RINGERS IV SOLN: INTRAVENOUS | Status: DC | PRN

## 2019-05-17 NOTE — Progress Notes (Signed)
Physical Therapy Treatment Patient Details Name: Madison Martinez MRN: [id number] DOB: [birth year] Today's Date: 05/17/2019    History of Present Illness Patient is an 86 year old female admitted from home with AMS. Fall prior to admission. MRI reveals 2 subcentimeter acute ashemic cortical infarcts. PMH includes: Blind in L eye, HOH, HTN, HLD, DM    PT Comments    Pt performed gt training and functional mobility with min guard to min VCs.  She is HOH and needs things repeated at times.  She continues to be appropriate to return home with HHPT. Will plan for stair training next session.    Follow Up Recommendations  Home health PT;Supervision for mobility/OOB     Equipment Recommendations  None recommended by PT    Recommendations for Other Services       Precautions / Restrictions Precautions Precautions: Fall Precaution Comments: HOH Restrictions Weight Bearing Restrictions: No    Mobility  Bed Mobility Overal bed mobility: Needs Assistance Bed Mobility: Supine to Sit;Sit to Supine     Supine to sit: Supervision Sit to supine: Supervision   General bed mobility comments: Increased time and effort with cues for hand placement.  Pt reports feeling stiff.  Transfers Overall transfer level: Needs assistance Equipment used: Rolling walker (2 wheeled) Transfers: Sit to/from Stand Sit to Stand: Min guard         General transfer comment: close min guard with cues for hand placement to and from seated surface.  Ambulation/Gait Ambulation/Gait assistance: Min guard Gait Distance (Feet): 140 Feet Assistive device: Rolling walker (2 wheeled) Gait Pattern/deviations: Step-through pattern;Decreased stride length;Trunk flexed Gait velocity: WFL   General Gait Details: Cues for scapular retraction and upper trunk control.  Pt required cues to use B LEs more and avoid pressing so hard on device.   Stairs             Wheelchair Mobility    Modified Rankin (Stroke  Patients Only) Modified Rankin (Stroke Patients Only) Pre-Morbid Rankin Score: No symptoms Modified Rankin: Moderately severe disability     Balance Overall balance assessment: Needs assistance   Sitting balance-Leahy Scale: Good     Standing balance support: Bilateral upper extremity supported;During functional activity Standing balance-Leahy Scale: Poor Standing balance comment: reliant on RW and external assist for safety (leaned against sink for support with ADLs)                            Cognition Arousal/Alertness: Awake/alert Behavior During Therapy: WFL for tasks assessed/performed Overall Cognitive Status: Within Functional Limits for tasks assessed                                 General Comments: WFL for basic tasks, requires some repetition however pt is very HOH      Exercises      General Comments        Pertinent Vitals/Pain Pain Assessment: No/denies pain Faces Pain Scale: Hurts little more Pain Location: B feet Pain Descriptors / Indicators: Sore;Discomfort Pain Intervention(s): Monitored during session;Repositioned    Home Living                      Prior Function            PT Goals (current goals can now be found in the care plan section) Acute Rehab PT Goals Patient Stated Goal: to return  home, feel better Potential to Achieve Goals: Good Progress towards PT goals: Progressing toward goals    Frequency    Min 4X/week      PT Plan Current plan remains appropriate    Co-evaluation              AM-PAC PT "6 Clicks" Mobility   Outcome Measure  Help needed turning from your back to your side while in a flat bed without using bedrails?: A Little Help needed moving from lying on your back to sitting on the side of a flat bed without using bedrails?: A Little Help needed moving to and from a bed to a chair (including a wheelchair)?: A Little Help needed standing up from a chair using your  arms (e.g., wheelchair or bedside chair)?: A Little Help needed to walk in hospital room?: A Little Help needed climbing 3-5 steps with a railing? : A Little 6 Click Score: 18    End of Session Equipment Utilized During Treatment: Gait belt Activity Tolerance: Patient tolerated treatment well Patient left: with call bell/phone within reach;in chair;with chair alarm set Nurse Communication: Mobility status PT Visit Diagnosis: Unsteadiness on feet (R26.81);Difficulty in walking, not elsewhere classified (R26.2);Muscle weakness (generalized) (M62.81);History of falling (Z91.81);Pain Pain - Right/Left: (Bilateral.) Pain - part of body: Ankle and joints of foot     Time: [id number] PT Time Calculation (min) (ACUTE ONLY): 31 min  Charges:  $Gait Training: 8-22 mins $Therapeutic Activity: 8-22 mins                     Erasmo Leventhal , PTA Acute Rehabilitation Services Pager [phone] Office [phone]     Casey Fye Eli Hose 05/17/2019, 3:41 PM

## 2019-05-17 NOTE — Progress Notes (Signed)
  Echocardiogram Echocardiogram Transesophageal has been performed.  Madison Martinez 05/17/2019, 12:27 PM

## 2019-05-17 NOTE — Transfer of Care (Signed)
Immediate Anesthesia Transfer of Care Note  Patient: Madison Martinez  Procedure(s) Performed: TRANSESOPHAGEAL ECHOCARDIOGRAM (TEE) (N/A ) BUBBLE STUDY  Patient Location:Endo   Anesthesia Type:MAC  Level of Consciousness: awake, drowsy and patient cooperative  Airway & Oxygen Therapy: Patient Spontanous Breathing  Post-op Assessment: Report given to RN and Post -op Vital signs reviewed and stable  Post vital signs: Reviewed and stable  Last Vitals:  Vitals Value Taken Time  BP 156/67 05/17/19 1221  Temp 36.5 C 05/17/19 1221  Pulse 43 05/17/19 1223  Resp 22 05/17/19 1223  SpO2 100 % 05/17/19 1223  Vitals shown include unvalidated device data.  Last Pain:  Vitals:   05/17/19 1221  TempSrc: Temporal  PainSc: 0-No pain         Complications: No apparent anesthesia complications

## 2019-05-17 NOTE — Progress Notes (Signed)
PROGRESS NOTE    Tranese Delawder Broad  [id number] DOB: 08-10-1927 DOA: 05/14/2019 PCP: Ma Hillock, DO    Brief Narrative:  Patient was admitted to the hospital with a working diagnosis of toxic/metabolic encephalopathy,in the setting of acute colitis,rule out CVA.  86 year old female who presented with dysphagia and altered mental status. She does have significant past medical history for left eye blindness, hypoacusia, ambulatory dysfunction, hypertension, dyslipidemia, type 2 diabetes mellitus, spinal stenosis and polyneuropathy. At baseline patient independent. 02/23patient was found confused, unable to follow commands, positive aphasia. On her initial physical examination blood pressure 114/54, heart rate 80, respiratory rate 23, oxygen saturation 92%, her lungs were clear to auscultation bilaterally, heart S1-S2 present rhythmic, abdomen soft, mild tender to palpation,no lower extremity edema. Patient was awake and alert, she was disoriented,but nonfocal. Sodium 130, potassium 4.1, chloride 96, bicarb 22, glucose 96, BUN 48, creatinine 2.96, white count 23.1, hemoglobin 12.4, hematocrit 37.3,platelets 198. SARS COVID-19 negative. EKG 63 bpm, normal axis, right bundle branch block, sinus rhythm, PACs, no ST segment or T wave changes. CT head with positive atrophy, no acute changes. CT of the abdomen positive for colitis involving the descending colon, sigmoid diverticulosis. Cholelithiasis.  Patient has been placed on IV antibiotic therapy, intravenous fluids and placed on stroke workup protocol.  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 Brain MRI 2 punctuate subcentimiter acute non hemorrhagic cortical infarcts on the left frontotemporal region.   TEE done today, ruled out aortic valve mass.    Assessment & Plan:   Principal Problem:   Aortic valve mass Active Problems:  AKI (acute kidney injury) ([place])   Hyponatremia   AMS (altered mental status)   Colitis   Generalized weakness   CVA (cerebral vascular accident) ([place])    1. Acute metabolic/ toxic (gabapentin) encephalopathy in the setting of acute CVA (present on admission). Brain MRI with positive acute subcentimiter cortical infarcts left fronto parietal likely embolic and echocardiogram with positive aortic valve mass (0,8 to 0.5 cm). Positive brain vascular atherosclerosis and brain atrophy. Carotids with no significant stenosis.   Patient clinically continue to improve. TEE ruled out aortic valve mass.   Continue with asa and clopidogrel, statin therapy and blood pressure control.   Will need home health services per pt/ot recommendations.    2. Acute colitis/ likely ischemic/ embolic.  wbc down to [id number] will change to oral Augmentin for 7 more days.   3. AKI on CKD stage 3a with hyponatremia.Renal function with serum cr down to 1,26 with K at 4,7 and serum bicarbonate at 23.  Patient is tolerating po well.   4. Controlled T2DM (HgA1c 5,5)/ dyslipidemia. Fasting glucose this am is 131, no insulin indicated.   Continue with pravastatin for dyslipidemia.   5. HTN. On metoprolol for blood pressure control.    DVT prophylaxis:enoxaparin Code Status:full Family Communication: no family at the bedside     Consultants:   Neurology   Procedures:     Antimicrobials:   Ceftriaxone/ metronidazole stop 02/26  02/26 Augmentin       Subjective: Patient's abdominal pain has improved, no nausea or vomiting, no further confusion. Improved strength.   Objective: Vitals:   05/16/19 2021 05/16/19 2300 05/17/19 0305 05/17/19 0720  BP:  (!) 175/68 (!) 169/78 (!) 165/72  Pulse:  71 72 72  Resp:  18 16 18   Temp:  98.4 F (36.9 C) 98 F (36.7 C) 98.7  F (37.1 C)  TempSrc:  Oral  Oral  SpO2: 98% 96% 95% 92%  Weight:      Height:        Intake/Output Summary (Last 24  hours) at 05/17/2019 0950 Last data filed at 05/17/2019 0302 Gross per 24 hour  Intake 580 ml  Output 2200 ml  Net -1620 ml   Filed Weights   05/14/19 2045  Weight: 65.2 kg    Examination:   General: Not in pain or dyspnea, deconditioned  Neurology: Awake and alert, non focal, decreased hearing E ENT: no pallor, no icterus, oral mucosa moist Cardiovascular: No JVD. S1-S2 present, rhythmic, no gallops, rubs, or murmurs. No lower extremity edema. Pulmonary: positive breath sounds bilaterally, adequate air movement, no wheezing, rhonchi or rales. Gastrointestinal. Abdomen with no organomegaly, non tender, no rebound or guarding Skin. No rashes Musculoskeletal: no joint deformities     Data Reviewed: I have personally reviewed following labs and imaging studies  CBC: Recent Labs  Lab 05/14/19 2012 05/14/19 2021 05/15/19 1002 05/17/19 0336  WBC 23.1*  --  17.4* 12.1*  NEUTROABS 14.8*  --   --  5.3  HGB 12.4 12.2 11.7* 11.4*  HCT 37.3 36.0 36.4 34.6*  MCV 95.2  --  95.8 94.3  PLT 198  --  184 [id number]   Basic Metabolic Panel: Recent Labs  Lab 05/14/19 2012 05/14/19 2021 05/15/19 1002 05/16/19 0028 05/17/19 0336  NA 130* 130* 137 135 142  K 4.1 4.1 3.7 4.0 4.7  CL 96* 96* 105 105 109  CO2 22  --  22 20* 23  GLUCOSE 96 92 129* 109* 131*  BUN 48* 45* 36* 29* 24*  CREATININE 2.96* 3.10* 1.92* 1.44* 1.27*  CALCIUM 9.2  --  9.0 9.0 9.8   GFR: Estimated Creatinine Clearance: 26.5 mL/min (A) (by C-G formula based on SCr of 1.27 mg/dL (H)). Liver Function Tests: Recent Labs  Lab 05/14/19 2012  AST 30  ALT 16  ALKPHOS 68  BILITOT 0.6  PROT 6.6  ALBUMIN 3.4*   No results for input(s): LIPASE, AMYLASE in the last 168 hours. Recent Labs  Lab 05/14/19 2325  AMMONIA 18   Coagulation Profile: Recent Labs  Lab 05/14/19 2012  INR 1.1   Cardiac Enzymes: Recent Labs  Lab 05/15/19 1002  CKTOTAL 117   BNP (last 3 results) No results for input(s): PROBNP in the  last 8760 hours. HbA1C: Recent Labs    05/15/19 1002  HGBA1C 5.5   CBG: Recent Labs  Lab 05/14/19 2011 05/15/19 0932 05/15/19 1228  GLUCAP 91 87 105*   Lipid Profile: Recent Labs    05/15/19 1002  CHOL 190  HDL 31*  LDLCALC 104*  TRIG 276*  CHOLHDL 6.1   Thyroid Function Tests: Recent Labs    05/14/19 2305 05/15/19 1002  TSH 4.982*  --   FREET4  --  0.80   Anemia Panel: Recent Labs    05/14/19 2305  [id number]      Radiology Studies: I have reviewed all of the imaging during this hospital visit personally     Scheduled Meds: . aspirin EC  325 mg Oral Daily  . cholecalciferol  1,000 Units Oral Daily  . clopidogrel  75 mg Oral Daily  . dorzolamide-timolol  1 drop Right Eye BID  . gabapentin  300 mg Oral BID  . Gerhardt's butt cream   Topical QID  . heparin  5,000 Units Subcutaneous Q8H  . latanoprost  1 drop Right  Eye QHS  . metoprolol succinate  25 mg Oral Daily  . metroNIDAZOLE  500 mg Oral Q8H  . multivitamin with minerals  1 tablet Oral Daily  . pravastatin  40 mg Oral q1800  . saccharomyces boulardii  250 mg Oral BID   Continuous Infusions: . sodium chloride 20 mL/hr at 05/17/19 0010  . cefTRIAXone (ROCEPHIN)  IV 2 g (05/17/19 0529)     LOS: 2 days        Janequa Kipnis Gerome Apley, MD

## 2019-05-17 NOTE — Interval H&P Note (Signed)
History and Physical Interval Note:  05/17/2019 11:54 AM  Madison Martinez  has presented today for surgery, with the diagnosis of STROKE,MASS ON AORTIC VALVE.  The various methods of treatment have been discussed with the patient and family. After consideration of risks, benefits and other options for treatment, the patient has consented to  Procedure(s): TRANSESOPHAGEAL ECHOCARDIOGRAM (TEE) (N/A) as a surgical intervention.  The patient's history has been reviewed, patient examined, no change in status, stable for surgery.  I have reviewed the patient's chart and labs.  Questions were answered to the patient's satisfaction.     Kirk Ruths

## 2019-05-17 NOTE — Anesthesia Postprocedure Evaluation (Signed)
Anesthesia Post Note  Patient: Madison Martinez  Procedure(s) Performed: TRANSESOPHAGEAL ECHOCARDIOGRAM (TEE) (N/A ) BUBBLE STUDY     Patient location during evaluation: Endoscopy Anesthesia Type: MAC Level of consciousness: awake and alert Pain management: pain level controlled Vital Signs Assessment: post-procedure vital signs reviewed and stable Respiratory status: spontaneous breathing, nonlabored ventilation, respiratory function stable and patient connected to nasal cannula oxygen Cardiovascular status: blood pressure returned to baseline and stable Postop Assessment: no apparent nausea or vomiting Anesthetic complications: no    Last Vitals:  Vitals:   05/17/19 1221 05/17/19 1231  BP: (!) 156/67 (!) 186/53  Pulse: 69 68  Resp: 20 18  Temp: 36.5 C   SpO2: 96% 97%    Last Pain:  Vitals:   05/17/19 1231  TempSrc:   PainSc: 0-No pain                 Barnet Glasgow

## 2019-05-17 NOTE — Progress Notes (Signed)
Occupational Therapy Treatment Patient Details Name: Madison Martinez MRN: [id number] DOB: 1927/07/26 Today's Date: 05/17/2019    History of present illness Patient is an 86 year old female admitted from home with AMS. Fall prior to admission. MRI reveals 2 subcentimeter acute ashemic cortical infarcts. PMH includes: Blind in L eye, HOH, HTN, HLD, DM   OT comments  Patient continues to make steady progress towards goals in skilled OT session. Patient's session encompassed ADLs standing at the sink and functional mobility with RW to promote increased independence upon returning home. Pt able to complete bed mobility and ambulation with decreased assist in session, and was able to stand for the duration of ADL tasks at sink. Pt reliant on one forearm braced at sink countertop, however increased fatigue not noted. Pt continues to make progress in therapy and would continue to benefit from acute services in order to focus on activity tolerance and independence; will follow acutely.    Follow Up Recommendations  Home health OT;Supervision/Assistance - 24 hour    Equipment Recommendations  None recommended by OT    Recommendations for Other Services      Precautions / Restrictions Precautions Precautions: Fall Precaution Comments: HOH Restrictions Weight Bearing Restrictions: No       Mobility Bed Mobility Overal bed mobility: Needs Assistance Bed Mobility: Supine to Sit     Supine to sit: Supervision        Transfers Overall transfer level: Needs assistance Equipment used: Rolling walker (2 wheeled) Transfers: Sit to/from Stand Sit to Stand: Min guard;Min assist         General transfer comment: min A for overall safety, however much improved to power up from lower surface    Balance Overall balance assessment: Needs assistance Sitting-balance support: Feet supported Sitting balance-Leahy Scale: Good     Standing balance support: Bilateral upper extremity  supported;During functional activity Standing balance-Leahy Scale: Poor Standing balance comment: reliant on RW and external assist for safety (leaned against sink for support with ADLs)                           ADL either performed or assessed with clinical judgement   ADL Overall ADL's : Needs assistance/impaired     Grooming: Min guard;Set up;Wash/dry face;Oral care;Brushing hair;Bed level;Standing Grooming Details (indicate cue type and reason): Washed face in bed "to get sleepers out of eyes" then completed ADLs in standing             Lower Body Dressing: Sitting/lateral leans;Min guard Lower Body Dressing Details (indicate cue type and reason): Sitting to adjudt bilateral socks Toilet Transfer: Minimal assistance;Ambulation;RW Toilet Transfer Details (indicate cue type and reason): Simulated with chair transfer         Functional mobility during ADLs: Minimal assistance;Rolling walker;Min guard General ADL Comments: Pt with improved endurance in session, however requires min/mod verbal cues for walker managment     Vision       Perception     Praxis      Cognition Arousal/Alertness: Awake/alert Behavior During Therapy: WFL for tasks assessed/performed Overall Cognitive Status: Within Functional Limits for tasks assessed                                 General Comments: WFL for basic tasks, requires some repetition however pt is very Madison Martinez        Exercises  Shoulder Instructions       General Comments      Pertinent Vitals/ Pain       Pain Assessment: No/denies pain  Home Living                                          Prior Functioning/Environment              Frequency  Min 2X/week        Progress Toward Goals  OT Goals(current goals can now be found in the care plan section)  Progress towards OT goals: Progressing toward goals  Acute Rehab OT Goals Patient Stated Goal: to return  home, feel better OT Goal Formulation: With patient Time For Goal Achievement: 05/29/19 Potential to Achieve Goals: Good  Plan Discharge plan remains appropriate    Co-evaluation                 AM-PAC OT "6 Clicks" Daily Activity     Outcome Measure   Help from another person eating meals?: None Help from another person taking care of personal grooming?: A Little Help from another person toileting, which includes using toliet, bedpan, or urinal?: A Little Help from another person bathing (including washing, rinsing, drying)?: A Little Help from another person to put on and taking off regular upper body clothing?: A Little Help from another person to put on and taking off regular lower body clothing?: A Little 6 Click Score: 19    End of Session Equipment Utilized During Treatment: Rolling walker;Gait belt  OT Visit Diagnosis: Muscle weakness (generalized) (M62.81);Unsteadiness on feet (R26.81)   Activity Tolerance Patient tolerated treatment well;Patient limited by fatigue   Patient Left in chair;with call bell/phone within reach;with chair alarm set   Nurse Communication Mobility status        Time: [id number] OT Time Calculation (min): 29 min  Charges: OT General Charges $OT Visit: 1 Visit OT Treatments $Self Care/Home Management : 23-37 mins  [place]. [place], [place] Acute Rehabilitation Services [phone] [place] 05/17/2019, 10:14 AM

## 2019-05-17 NOTE — Progress Notes (Signed)
STROKE TEAM PROGRESS NOTE   INTERVAL HISTORY Son at bedside.  Patient awake alert, interactive, no neuro changes, no acute event overnight.  Had TEE today with cardiology, showed no aortic valve mass.  Vitals:   05/17/19 1139 05/17/19 1221 05/17/19 1231 05/17/19 1241  BP: (!) 193/93 (!) 156/67 (!) 186/53 (!) 184/63  Pulse: 70 69 68 (!) 114  Resp: 18 20 18 16   Temp: (!) 97.3 F (36.3 C) 97.7 F (36.5 C)    TempSrc: Temporal Temporal    SpO2: 97% 96% 97% 97%  Weight: 65.2 kg     Height:        CBC:  Recent Labs  Lab 05/14/19 2012 05/14/19 2021 05/15/19 1002 05/17/19 0336  WBC 23.1*   < > 17.4* 12.1*  NEUTROABS 14.8*  --   --  5.3  HGB 12.4   < > 11.7* 11.4*  HCT 37.3   < > 36.4 34.6*  MCV 95.2   < > 95.8 94.3  PLT 198   < > 184 174   < > = values in this interval not displayed.    Basic Metabolic Panel:  Recent Labs  Lab 05/16/19 0028 05/17/19 0336  NA 135 142  K 4.0 4.7  CL 105 109  CO2 20* 23  GLUCOSE 109* 131*  BUN 29* 24*  CREATININE 1.44* 1.27*  CALCIUM 9.0 9.8   Lipid Panel:     Component Value Date/Time   CHOL 190 05/15/2019 1002   TRIG 276 (H) 05/15/2019 1002   HDL 31 (L) 05/15/2019 1002   CHOLHDL 6.1 05/15/2019 1002   VLDL 55 (H) 05/15/2019 1002   LDLCALC 104 (H) 05/15/2019 1002   HgbA1c:  Lab Results  Component Value Date   HGBA1C 5.5 05/15/2019   IMAGING past 48 hours EEG  Result Date: 05/15/2019 Lora Havens, MD     05/15/2019  2:11 PM Patient Name: Demond Sahm Neubecker MRN: [id number] Epilepsy Attending: Lora Havens Referring Physician/Provider: Dr. Shela Leff Date: 05/15/2019 Duration: 27.29 minutes Patient history: 86yo F who presented with AMS and dysphasia. EEG to evaluate for seizure. Level of alertness: awake AEDs during EEG study: None Technical aspects: This EEG study was done with scalp electrodes positioned according to the 10-20 International system of electrode placement. Electrical activity was acquired at a sampling rate  of 500Hz  and reviewed with a high frequency filter of 70Hz  and a low frequency filter of 1Hz . EEG data were recorded continuously and digitally stored. DESCRIPTION:  The posterior dominant rhythm consists of 9-10 Hz activity of moderate voltage (25-35 uV) seen predominantly in posterior head regions, symmetric and reactive to eye opening and eye closing. EEG showed intermittent left frontotemporal 2-3Hz  delta slowing, which at times appears sharply contoured. Photic driving was not seen during photic stimulation. Hyperventilation was not performed. ABNORMALITY - Intermittent slow, left frontotemporal IMPRESSION: This study is suggestive of non specific cortical dysfunction in left frontotemporal region. No seizures or definite epileptiform discharges were seen throughout the recording. Lora Havens   ECHO TEE  Result Date: 05/17/2019    TRANSESOPHOGEAL ECHO REPORT   Patient Name:   CARLE VANDAELE Date of Exam: 05/17/2019 Medical Rec #:  [id number]   Height:       64.5 in Accession #:    [id number]  Weight:       143.7 lb Date of Birth:  12/27/1927   BSA:          1.709 m Patient Age:  86 years    BP:           156/67 mmHg Patient Gender: F           HR:           69 bpm. Exam Location:  Inpatient Procedure: Transesophageal Echo Indications:     Stroke 434.91 / I63.9  History:         Patient has prior history of Echocardiogram examinations, most                  recent 05/15/2019. Risk Factors:Dyslipidemia, Hypertension and                  Diabetes. CVA.  Sonographer:     Clayton Lefort RDCS (AE) Referring Phys:  [id number] [place] Diagnosing Phys: Kirk Ruths MD PROCEDURE: After discussion of the risks and benefits of a TEE, an informed consent was obtained from the patient. The transesophogeal probe was passed without difficulty through the esophogus of the patient. Sedation performed by different physician. Image quality was adequate. The patient developed no complications during the procedure.  IMPRESSIONS  1. Normal LV function; moderate LAE; trace AI; mild MR and TR.  2. Left ventricular ejection fraction, by estimation, is 55 to 60%. The left ventricle has normal function. The left ventricle has no regional wall motion abnormalities.  3. Right ventricular systolic function is normal. The right ventricular size is normal.  4. Left atrial size was moderately dilated. No left atrial/left atrial appendage thrombus was detected.  5. The mitral valve is normal in structure and function. Mild mitral valve regurgitation.  6. The aortic valve is tricuspid. Aortic valve regurgitation is trivial. Mild to moderate aortic valve sclerosis/calcification is present, without any evidence of aortic stenosis.  7. There is Moderate (Grade III) plaque involving the descending aorta.  8. Agitated saline contrast bubble study was negative, with no evidence of any interatrial shunt. FINDINGS  Left Ventricle: Left ventricular ejection fraction, by estimation, is 55 to 60%. The left ventricle has normal function. The left ventricle has no regional wall motion abnormalities. The left ventricular internal cavity size was normal in size. There is  no left ventricular hypertrophy. Right Ventricle: The right ventricular size is normal.Right ventricular systolic function is normal. Left Atrium: Left atrial size was moderately dilated. No left atrial/left atrial appendage thrombus was detected. Right Atrium: Right atrial size was normal in size. Pericardium: There is no evidence of pericardial effusion. Mitral Valve: The mitral valve is normal in structure and function. Mild mitral valve regurgitation. Tricuspid Valve: The tricuspid valve is normal in structure. Tricuspid valve regurgitation is mild. Aortic Valve: The aortic valve is tricuspid. Aortic valve regurgitation is trivial. Mild to moderate aortic valve sclerosis/calcification is present, without any evidence of aortic stenosis. Pulmonic Valve: The pulmonic valve was not well  visualized. Pulmonic valve regurgitation is not visualized. Aorta: The aortic root is normal in size and structure. There is moderate (Grade III) plaque involving the descending aorta. IAS/Shunts: No atrial level shunt detected by color flow Doppler. Agitated saline contrast was given intravenously to evaluate for intracardiac shunting. Agitated saline contrast bubble study was negative, with no evidence of any interatrial shunt. Additional Comments: Normal LV function; moderate LAE; trace AI; mild MR and TR.   AORTA Ao Root diam: 3.00 cm Ao Asc diam:  3.40 cm Kirk Ruths MD Electronically signed by Kirk Ruths MD Signature Date/Time: 05/17/2019/12:40:51 PM    Final     PHYSICAL EXAM  Temp:  [97.3 F (36.3 C)-99 F (37.2 C)] 97.7 F (36.5 C) (02/26 1221) Pulse Rate:  [66-114] 114 (02/26 1241) Resp:  [16-20] 16 (02/26 1241) BP: (156-193)/(53-93) 184/63 (02/26 1241) SpO2:  [92 %-98 %] 97 % (02/26 1241) FiO2 (%):  [21 %] 21 % (02/25 2021) Weight:  [65.2 kg] 65.2 kg (02/26 1139)  General - Well nourished, well developed, in no apparent distress.  Ophthalmologic - fundi not visualized due to noncooperation.  Cardiovascular - Regular rhythm and rate.  Mental Status -  Level of arousal and orientation to time, place, and person were intact. Language including expression, naming, repetition, comprehension was assessed and found intact.  Cranial Nerves II - XII - II - Visual field intact OU.  Left eye legally blind III, IV, VI - Extraocular movements intact. V - Facial sensation intact bilaterally. VII - Facial movement intact bilaterally. VIII - hard of hearing & vestibular intact bilaterally. X - Palate elevates symmetrically. XI - Chin turning & shoulder shrug intact bilaterally. XII - Tongue protrusion intact.  Motor Strength - The patient's strength was symmetrical in all extremities and pronator drift was absent.  Bulk was normal and fasciculations were absent.   Motor Tone -  Muscle tone was assessed at the neck and appendages and was normal.  Reflexes - The patient's reflexes were symmetrical in all extremities and she had no pathological reflexes.  Sensory - Light touch, temperature/pinprick were assessed and were symmetrical.    Coordination - The patient had normal movements in the hands with no ataxia or dysmetria.  Tremor was absent.  Gait and Station - deferred.   ASSESSMENT/PLAN Ms. Loey Halderman Waller is a 86 y.o. female with history of HTN, HLD, DM2, spinal stenosis, OS glaucoma, hx BRVO presenting with confusion and aphasia after being found down.  Stroke:   2 frontoparietal cortical infarcts due to hypoperfusion in setting of dehydration and AKI wiht left tICA and MCA severe stenosis, although cardioembolic source can not be completely ruled out  Code Stroke CT head No acute abnormality. Moderate atrophy and small vessel disease. ASPECTS 10.     MRI  2 punctate L frontoparietal cortical infarcts. Moderate advanced Small vessel disease and Atrophy. L paranasal sinus dz.   MRA  Severe L ICA terminus to L M1 stenosis. L V4 occlusion beyond takeoff of L PICA. R V4 60% stenosis. Extensive atherosclerosis B ICA siphons w/ moderate to advanced focal stenoses. R ICA 17mm aneurysm.   Carotid Doppler  B ICA 1-39% stenosis, VAs antegrade   2D Echo EF 60-65%. Right AV mobile mass  EEG left frontal temporal intermittent slowing, no seizure  LDL 104  HgbA1c 5.5  Heparin 5000 units sq tid for VTE prophylaxis  aspirin 81 mg daily prior to admission, now on aspirin 325 mg daily and plavix DAPT. Continue DAPT x 3 months the plavix alone given severe intracranial stenosis.  Therapy recommendations:  HH PT, HH OT, HH SLP   Disposition:  pending   Intracranial stenosis  MRA showed severe left terminal ICA and M1 stenosis, extensive atherosclerosis bilateral ICA siphon with moderate to advanced focal stenosis  Chronic in nature, no intervention needed at this  time  On DAPT for maximal medical treatment  Right aortic valve mobile mass ruled out  TTE showed a mass (0.8 cm x 0.5 cm) that appears to be attached to the  right coronary cusp of the aortic valve. 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  TEE normal,  no masses, neg bubble  Dehydration and AKI on CKD stage 3a, improving  Cre baseline 1.13  This admission creatinine 2.96->3.10->1.92->1.44->1.27  Sodium 130->137->135->142   WBC 23.1->17.4->12.1   Off IV fluid  Acute infectious versus inflammatory colitis  CT abdomen pelvis showed involvement of the descending colon  Leukocytosis, WBC 23.1->17.4->12.1   On Rocephin and Flagyl  Off IV fluid now  Hypertension  Stable . Gradually normalize to goal in 3-5 days . Long-term BP goal 130-150 given severe intracranial stenosis  Hyperlipidemia  Home meds:  pravachol 10, resumed in hospital  LDL 104, goal < 70  Increase Pravachol to 40  Continue statin at discharge  Diabetes type II Controlled  HgbA1c 5.5, goal < 7.0  CBGs  SSI  Close PCP follow-up  Other Stroke Risk Factors  Advanced age  Other Active Problems  Hyponatremia, resolved, sodium 130-137-135-142  R ICA 70mm aneurysm - follow up in neurology as outpt for monitoring  [facility]  Neurology will sign off. Please call with questions. Pt will follow up with stroke clinic NP at [facility] in about 4 weeks. Thanks for the consult.   Rosalin Hawking, MD PhD Stroke Neurology 05/17/2019 1:28 PM   To contact Stroke Continuity provider, please refer to [email or web address] After hours, contact General Neurology

## 2019-05-17 NOTE — Progress Notes (Signed)
    Transesophageal Echocardiogram Note  Madison Martinez [id number] [birth year]  Procedure: Transesophageal Echocardiogram Indications: CVA  Procedure Details Consent: Obtained Time Out: Verified patient identification, verified procedure, site/side was marked, verified correct patient position, special equipment/implants available, Radiology Safety Procedures followed,  medications/allergies/relevent history reviewed, required imaging and test results available.  Performed  Medications:  Pt sedated by anesthesia with diprovan 60 mg IV.  Normal LV function; sclerotic aortic valve with trace AI; no masses; mild MR and TR; moderate LAE; negative saline microcavitation study.   Complications: No apparent complications Patient did tolerate procedure well.  Kirk Ruths, MD

## 2019-05-17 NOTE — Anesthesia Procedure Notes (Signed)
Procedure Name: MAC Date/Time: 05/17/2019 12:01 PM Performed by: Oletta Lamas, CRNA Pre-anesthesia Checklist: Patient identified, Emergency Drugs available, Suction available, Timeout performed and Patient being monitored Patient Re-evaluated:Patient Re-evaluated prior to induction Oxygen Delivery Method: Nasal cannula

## 2019-05-17 NOTE — Progress Notes (Signed)
PHARMACY NOTE:  ANTIMICROBIAL RENAL DOSAGE ADJUSTMENT  Current antimicrobial regimen includes a mismatch between antimicrobial dosage and estimated renal function.  As per policy approved by the Pharmacy & Therapeutics and Medical Executive Committees, the antimicrobial dosage will be adjusted accordingly.  Current antimicrobial dosage:  Augmentin 875 mg PO Q12h  Indication: colitis  Renal Function:  Estimated Creatinine Clearance: 26.5 mL/min (A) (by C-G formula based on SCr of 1.27 mg/dL (H)). []      On intermittent HD, scheduled: []      On CRRT    Antimicrobial dosage has been changed to:  Augmentin 500mg  PO q12h  Additional comments:   Imani Sherrin A. Levada Dy, PharmD, BCPS, FNKF Clinical Pharmacist [place] Please utilize Amion for appropriate phone number to reach the unit pharmacist ([place])   05/17/2019 4:54 PM

## 2019-05-18 LAB — CBC WITH DIFFERENTIAL/PLATELET
Abs Immature Granulocytes: 0.08 10*3/uL — ABNORMAL HIGH (ref 0.00–0.07)
Basophils Absolute: 0.1 10*3/uL (ref 0.0–0.1)
Basophils Relative: 1 %
Eosinophils Absolute: 0.4 10*3/uL (ref 0.0–0.5)
Eosinophils Relative: 3 %
HCT: 36.6 % (ref 36.0–46.0)
Hemoglobin: 11.6 g/dL — ABNORMAL LOW (ref 12.0–15.0)
Immature Granulocytes: 1 %
Lymphocytes Relative: 43 %
Lymphs Abs: 5.6 10*3/uL — ABNORMAL HIGH (ref 0.7–4.0)
MCH: 31 pg (ref 26.0–34.0)
MCHC: 31.7 g/dL (ref 30.0–36.0)
MCV: 97.9 fL (ref 80.0–100.0)
Monocytes Absolute: 1 10*3/uL (ref 0.1–1.0)
Monocytes Relative: 8 %
Neutro Abs: 5.8 10*3/uL (ref 1.7–7.7)
Neutrophils Relative %: 44 %
Platelets: 202 10*3/uL (ref 150–400)
RBC: 3.74 MIL/uL — ABNORMAL LOW (ref 3.87–5.11)
RDW: 13.2 % (ref 11.5–15.5)
WBC: 12.9 10*3/uL — ABNORMAL HIGH (ref 4.0–10.5)
nRBC: 0 % (ref 0.0–0.2)

## 2019-05-18 LAB — BASIC METABOLIC PANEL
Anion gap: 11 (ref 5–15)
BUN: 21 mg/dL (ref 8–23)
CO2: 21 mmol/L — ABNORMAL LOW (ref 22–32)
Calcium: 9.5 mg/dL (ref 8.9–10.3)
Chloride: 106 mmol/L (ref 98–111)
Creatinine, Ser: 1.09 mg/dL — ABNORMAL HIGH (ref 0.44–1.00)
GFR calc Af Amer: 52 mL/min — ABNORMAL LOW (ref >60)
GFR calc non Af Amer: 45 mL/min — ABNORMAL LOW (ref >60)
Glucose, Bld: 145 mg/dL — ABNORMAL HIGH (ref 70–99)
Potassium: 4.2 mmol/L (ref 3.5–5.1)
Sodium: 138 mmol/L (ref 135–145)

## 2019-05-18 MED ORDER — AMOXICILLIN-POT CLAVULANATE 500-125 MG PO TABS: 1.0000 | ORAL_TABLET | Freq: Two times a day (BID) | ORAL | 0 refills | Status: DC

## 2019-05-18 MED ORDER — ASPIRIN 325 MG PO TBEC: 325.0000 mg | DELAYED_RELEASE_TABLET | Freq: Every day | ORAL | 0 refills | Status: DC

## 2019-05-18 MED ORDER — CLOPIDOGREL BISULFATE 75 MG PO TABS: 75.0000 mg | ORAL_TABLET | Freq: Every day | ORAL | 0 refills | Status: DC

## 2019-05-18 NOTE — Progress Notes (Signed)
Occupational Therapy Treatment Patient Details Name: Madison Martinez MRN: [id number] DOB: [birth year] Today's Date: 05/18/2019    History of present illness Patient is an 86 year old female admitted from home with AMS. Fall prior to admission. MRI reveals 2 subcentimeter acute ashemic cortical infarcts. PMH includes: Blind in L eye, HOH, HTN, HLD, DM   OT comments  Pt making steady progress towards OT goals this session. Session focus on functional mobility from EOB<>BR, LB dressing and standing grooming tasks at sink. Overall, pt modified independent with RW for functional mobility from EOB<>BR and for all toileting tasks. Pt uses grab bars for sit<>stand but reports having grab bars at home. MOD I for standing grooming and LB dressing. DC plan currently remains appropriate, will follow acutely per POC.    Follow Up Recommendations  Home health OT;Supervision/Assistance - 24 hour    Equipment Recommendations  None recommended by OT    Recommendations for Other Services      Precautions / Restrictions Precautions Precautions: Fall Precaution Comments: HOH Restrictions Weight Bearing Restrictions: No       Mobility Bed Mobility Overal bed mobility: Needs Assistance Bed Mobility: Supine to Sit;Sit to Supine     Supine to sit: Modified independent (Device/Increase time) Sit to supine: Modified independent (Device/Increase time)   General bed mobility comments: no assist needed, +rail  Transfers Overall transfer level: Needs assistance Equipment used: Rolling walker (2 wheeled) Transfers: Sit to/from Stand Sit to Stand: Modified independent (Device/Increase time)         General transfer comment: no assist needed    Balance Overall balance assessment: Needs assistance Sitting-balance support: Feet supported Sitting balance-Leahy Scale: Good     Standing balance support: No upper extremity supported;During functional activity Standing balance-Leahy Scale: Fair Standing  balance comment: able to complete standing grooming tasks at sink with no UE support                           ADL either performed or assessed with clinical judgement   ADL Overall ADL's : Needs assistance/impaired     Grooming: Wash/dry hands;Standing;Modified independent               Lower Body Dressing: Sitting/lateral leans;Modified independent Lower Body Dressing Details (indicate cue type and reason): able to adjust bilateral socks from EOB Toilet Transfer: Modified Independent;Ambulation;Grab bars;RW [employer] forces technical officer Details (indicate cue type and reason): MOD I with pt able to ambulate EOB<>BR with RW. pt noted to use grab bars for sit<>stand but reports having them at home Toileting- Clothing Manipulation and Hygiene: Modified independent;Sitting/lateral lean Toileting - Clothing Manipulation Details (indicate cue type and reason): no assist needed for [employer] Transfer Details (indicate cue type and reason): pt reports new walkin shower with seat at home Functional mobility during ADLs: Modified independent;Rolling walker General ADL Comments: session focus on functional mobility from EOB<>BR with RW and standing grooming tasks at sink     Vision Baseline Vision/History: (blind in L eye; appears to compensate well)     Perception     Praxis      Cognition Arousal/Alertness: Awake/alert Behavior During Therapy: WFL for tasks assessed/performed Overall Cognitive Status: Within Functional Limits for tasks assessed                                          Exercises  Shoulder Instructions       General Comments      Pertinent Vitals/ Pain       Pain Assessment: No/denies pain  Home Living                                          Prior Functioning/Environment              Frequency  Min 2X/week        Progress Toward Goals  OT Goals(current goals can now be found in the care plan  section)  Progress towards OT goals: Progressing toward goals  Acute Rehab OT Goals Patient Stated Goal: to return home, feel better OT Goal Formulation: With patient Time For Goal Achievement: 05/29/19 Potential to Achieve Goals: Good  Plan Discharge plan remains appropriate    Co-evaluation                 AM-PAC OT "6 Clicks" Daily Activity     Outcome Measure   Help from another person eating meals?: None Help from another person taking care of personal grooming?: A Little Help from another person toileting, which includes using toliet, bedpan, or urinal?: A Little Help from another person bathing (including washing, rinsing, drying)?: A Little Help from another person to put on and taking off regular upper body clothing?: A Little Help from another person to put on and taking off regular lower body clothing?: A Little 6 Click Score: 19    End of Session Equipment Utilized During Treatment: Rolling walker  OT Visit Diagnosis: Muscle weakness (generalized) (M62.81);Unsteadiness on feet (R26.81)   Activity Tolerance Patient tolerated treatment well   Patient Left in bed;with call bell/phone within reach;with bed alarm set   Nurse Communication Mobility status        Time: [id number] OT Time Calculation (min): 14 min  Charges: OT General Charges $OT Visit: 1 Visit OT Treatments $Self Care/Home Management : 8-22 mins  Lanier Clam., COTA/L Acute Rehabilitation Services [phone]    Ihor Gully 05/18/2019, 1:08 PM

## 2019-05-18 NOTE — Discharge Summary (Addendum)
Physician Discharge Summary  Madison Martinez [id number] DOB: Aug 23, 1927 DOA: 05/14/2019  PCP: Ma Hillock, DO  Admit date: 05/14/2019 Discharge date: 05/18/2019  Admitted From: Home  Disposition:  Home   Recommendations for Outpatient Follow-up and new medication changes:  1. Follow up with Dr. Raoul Pitch in 7 days.  2. Follow up with Neurology as outpatient as scheduled. 3. Patient placed on full dose aspirin 325 mg with clopidogrel for 3 months, then continue clopidogrel alone.  4. Discontinue gabapentin.   Home Health: Yes   Equipment/Devices: walker    Discharge Condition: stable  CODE STATUS: full  Diet recommendation: heart healthy and diabetic prudent.   Brief/Interim Summary: Patient was admitted to the hospital with a working diagnosis of toxic/metabolic encephalopathy,in the setting of acute colitis,complicated by acute CVA.  86 year old female who presented with dysphagia and altered mental status. She does have significant past medical history for left eye blindness, hypoacusia, ambulatory dysfunction, hypertension, dyslipidemia, type 2 diabetes mellitus, spinal stenosis and polyneuropathy. At baseline patient independent. 02/23patient was found confused, unable to follow commands, positive aphasia. On her initial physical examination blood pressure 114/54, heart rate 80, respiratory rate 23, oxygen saturation 92%, her lungs were clear to auscultation bilaterally, heart S1-S2 present rhythmic, abdomen soft, mild tender to palpation,no lower extremity edema. Patient was awake and alert, she was disoriented,but nonfocal. Sodium 130, potassium 4.1, chloride 96, bicarb 22, glucose 96, BUN 48, creatinine 2.96, white count 23.1, hemoglobin 12.4, hematocrit 37.3,platelets 198. SARS COVID-19 negative. EKG 63 bpm, normal axis, right bundle branch block, sinus rhythm, PACs, no ST segment or T wave changes. CT head with positive atrophy, no acute changes. CT of the abdomen  positive for colitis involving the descending colon, sigmoid diverticulosis. Cholelithiasis.  Patient was placed on IV antibiotic therapy, intravenous fluids and underwent stroke workup per protocol.  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 Brain MRI 2 punctuate subcentimiter acute non hemorrhagic cortical infarcts on the left frontotemporal region.   Transesophageal echocardiogram was performed and aortic valve mass was ruled out.   1.  Acute metabolic/toxic (gabapentin) encephalopathy in the setting of acute CVA, acute subcentimeter cortical infarcts, left frontoparietal likely embolic.  Present on admission. Patient was admitted to the telemetry ward, she had frequent neuro checks, physical therapy, speech therapy, Occupational Therapy and neurology evaluations.  Further work-up with brain MRI showed 2 acute subcentimeter cortical infarcts at the left frontoparietal lobe.  Initial transthoracic echocardiogram showed a possible mass at the aortic valve is 0.8 to 0.5 cm.  Patient underwent transesophageal echocardiogram which ruled out this mentioned mass. MRA showed severe left ICA terminus to L M1 stenosis.  Carotid ultrasonography with no significant stenosis.  Electroencephalogram showed left frontal temporal intermittent slowing.  Patient was seen by neurology, recommendations to continue dual antiplatelet therapy for 3 months including full dose aspirin and clopidogrel.  Then continue with clopidogrel alone.  Patient was recommended to continue home health services.  2.  Acute colitis, possibly ischemic.  Patient received antibiotic therapy with intravenous ceftriaxone and oral metronidazole.  Her white count has been improving along with her symptoms.  Patient will finish antibiotic therapy with Augmentin for 7 more days.  3.  Acute kidney injury on chronic kidney disease  stage IIIa with hyponatremia.  Patient received intravenous fluids with good toleration, her intake improved.  Her discharge creatinine is 1.0, sodium 138, potassium 4.0, chloride 106, bicarb 21, BUN 21.  4.  Controlled type 2 diabetes mellitus, hemoglobin A1c 5.5.  Dyslipidemia.  Patient will continue glimepiride 4 glucose control at home.  Continue pravastatin for dyslipidemia.  5.  Hypertension.  Patient will continue her home blood pressure regimen including metoprolol, lisinopril and hydralazine.   Discharge Diagnoses:  Principal Problem:   CVA (cerebral vascular accident) ([place]) Active Problems:   AKI (acute kidney injury) ([place])   Hyponatremia   AMS (altered mental status)   Colitis   Generalized weakness   Aortic valve mass    Discharge Instructions  Discharge Instructions    Ambulatory referral to Neurology   Complete by: As directed    Follow up in stroke clinic at [facility] with Frann Rider, NP in about 4 weeks. If not available, consider Dr. Antony Contras, Dr. Bess Harvest, or Dr. Sarina Ill.     Allergies as of 05/18/2019      Reactions   Amlodipine Besylate Other (See Comments)   Vertigo   Flu Virus Vaccine Swelling   Site of swelling not known by son      Medication List    STOP taking these medications   gabapentin 300 MG capsule Commonly known as: NEURONTIN     TAKE these medications   amoxicillin-clavulanate 500-125 MG tablet Commonly known as: AUGMENTIN Take 1 tablet (500 mg total) by mouth every 12 (twelve) hours for 7 days.   aspirin 325 MG EC tablet Take 1 tablet (325 mg total) by mouth daily. Take only for 3 months. Start taking on: May 19, 2019 What changed:   medication strength  how much to take  additional instructions   clopidogrel 75 MG tablet Commonly known as: PLAVIX Take 1 tablet (75 mg total) by mouth daily. Start taking on: May 19, 2019   dorzolamide-timolol 22.3-6.8 MG/ML ophthalmic  solution Commonly known as: COSOPT Place 1 drop into the left eye 2 (two) times daily.   FLEXI JOINT RELIEF PLUS PO Take 2 capsules by mouth daily.   glipiZIDE 2.5 MG 24 hr tablet Commonly known as: GLUCOTROL XL TAKE 1 TABLET BY MOUTH  DAILY WITH BREAKFAST   hydrALAZINE 25 MG tablet Commonly known as: APRESOLINE TAKE 1 TABLET BY MOUTH  EVERY 8 HOURS. What changed: additional instructions   KRILL OIL PO Take 1 capsule by mouth daily.   lisinopril 30 MG tablet Commonly known as: ZESTRIL TAKE 1 TABLET BY MOUTH  DAILY   Melatonin 5 MG Tabs Take 5 mg by mouth at bedtime as needed (for sleep).   metoprolol succinate 25 MG 24 hr tablet Commonly known as: TOPROL-XL Take 1 tablet (25 mg total) by mouth daily.   NON FORMULARY Take 2 capsules by mouth See admin instructions. Selenex [place] Best for Glutathione Production, Immune, Liver & Anti-Aging Support capsules; Take 2 capsules by mouth daily   NON FORMULARY Take 2 capsules by mouth See admin instructions. UriVArx - Clinically Proven Bladder Control Capsules: Take 2 capsules by mouth once a [place]   One-A-Abbruzzese Womens 50 Plus Tabs Take 1 tablet by mouth daily.   Phillips Stool Softener 100 MG capsule Generic drug: docusate sodium Take 100 mg by mouth 2 (two) times daily as needed for mild constipation.   polyethylene glycol powder 17 GM/SCOOP powder Commonly known as: GLYCOLAX/MIRALAX Take 17 g by mouth daily as needed for mild constipation.   pravastatin 10 MG tablet Commonly known as: PRAVACHOL Take 1 tablet (10 mg total) by mouth daily.   Vitamin D-3 25 MCG (1000 UT) Caps  Take 1,000 Units by mouth daily.   Vyzulta 0.024 % Soln Generic drug: Latanoprostene Bunod Place 1 drop into the left eye at bedtime.      Follow-up Information    [facility] Follow up in 4 week(s).   Specialty: Neurology Why: stroke clinic. office will call with appt date and time. Contact information: [place] [phone]       Raoul Pitch, Joseph Art A, DO Follow up in 1 week(s).   Specialty: Family Medicine Contact information: [address] [place] [address] [phone]          Allergies  Allergen Reactions  . Amlodipine Besylate Other (See Comments)    Vertigo  . Flu Virus Vaccine Swelling    Site of swelling not known by son    Consultations:  Neurology   Cardiology TEE    Procedures/Studies: EEG  Result Date: 05/15/2019 Lora Havens, MD     05/15/2019  2:11 PM Patient Name: Madison Martinez MRN: [id number] Epilepsy Attending: Lora Havens Referring Physician/Provider: Dr. Shela Leff Date: 05/15/2019 Duration: 27.29 minutes Patient history: 86yo F who presented with AMS and dysphasia. EEG to evaluate for seizure. Level of alertness: awake AEDs during EEG study: None Technical aspects: This EEG study was done with scalp electrodes positioned according to the 10-20 International system of electrode placement. Electrical activity was acquired at a sampling rate of 500Hz  and reviewed with a high frequency filter of 70Hz  and a low frequency filter of 1Hz . EEG data were recorded continuously and digitally stored. DESCRIPTION:  The posterior dominant rhythm consists of 9-10 Hz activity of moderate voltage (25-35 uV) seen predominantly in posterior head regions, symmetric and reactive to eye opening and eye closing. EEG showed intermittent left frontotemporal 2-3Hz  delta slowing, which at times appears sharply contoured. Photic driving was not seen during photic stimulation. Hyperventilation was not performed. ABNORMALITY - Intermittent slow, left frontotemporal IMPRESSION: This study is suggestive of non specific cortical dysfunction in left frontotemporal region. No seizures or definite epileptiform discharges were seen throughout the recording. Lora Havens   CT Abdomen Pelvis Wo Contrast  Result Date: 05/14/2019 CLINICAL DATA:  86 year old  female with nausea and vomiting. EXAM: CT ABDOMEN AND PELVIS WITHOUT CONTRAST TECHNIQUE: Multidetector CT imaging of the abdomen and pelvis was performed following the standard protocol without IV contrast. COMPARISON:  None. FINDINGS: Evaluation of this exam is limited in the absence of intravenous contrast. Lower chest: The visualized lung bases are clear. No intra-abdominal free air or free fluid. Hepatobiliary: The liver is unremarkable. No intrahepatic biliary ductal dilatation. Multiple small stones noted within the gallbladder. The gallbladder is mildly distended. No pericholecystic fluid. Ultrasound may provide better evaluation of the gallbladder if there is clinical concern for acute cholecystitis. Pancreas: Unremarkable. No pancreatic ductal dilatation or surrounding inflammatory changes. Spleen: Normal in size without focal abnormality. Adrenals/Urinary Tract: The adrenal glands are unremarkable. There is no hydronephrosis or nephrolithiasis on either side. The visualized ureters appear unremarkable. The urinary bladder is suboptimally visualized due to streak artifact caused by bilateral hip arthroplasties. Stomach/Bowel: There is sigmoid diverticulosis without active inflammatory changes. There is diffuse inflammatory changes and thickening of the descending colon consistent with colitis. This is likely inflammatory or infectious in etiology. Ischemia is less likely but not excluded. There is no pneumatosis. Correlation with clinical exam and stool cultures recommended. There is no bowel obstruction. The appendix is normal. Vascular/Lymphatic: Advanced aortoiliac atherosclerotic disease. The IVC  is grossly unremarkable. No portal venous gas. There is no adenopathy. Reproductive: The uterus is grossly unremarkable. There is a 2.5 x 2.5 cm high attenuating structure associated with the right ovary which is not characterized on this CT. This may represent normal ovarian tissue or an ovarian lesion.  Evaluation of the pelvic structures is very limited due to streak artifact caused by bilateral metallic hip arthroplasties. Further evaluation with ultrasound on a nonemergent/outpatient basis recommended. Other: None Musculoskeletal: Osteopenia with degenerative changes of the spine. Bilateral hip arthroplasties. No acute osseous pathology. IMPRESSION: 1. Colitis involving the descending colon, likely inflammatory/infectious in etiology. Correlation with clinical exam and stool cultures recommended. No bowel obstruction. Normal appendix. 2. Sigmoid diverticulosis. 3. Cholelithiasis. 4. Advanced Aortic Atherosclerosis (ICD10-I70.0). Electronically Signed   By: Anner Crete M.D.   On: 05/14/2019 22:25   MR ANGIO HEAD WO CONTRAST  Result Date: 05/15/2019 CLINICAL DATA:  Initial evaluation for acute altered mental status. EXAM: MRI HEAD WITHOUT CONTRAST MRA HEAD WITHOUT CONTRAST TECHNIQUE: Multiplanar, multiecho pulse sequences of the brain and surrounding structures were obtained without intravenous contrast. Angiographic images of the head were obtained using MRA technique without contrast. COMPARISON:  Prior CT from 05/14/2019. FINDINGS: MRI HEAD FINDINGS Brain: Moderately advanced cerebral atrophy. Patchy and confluent T2/FLAIR hyperintensity within the periventricular deep white matter both cerebral hemispheres most consistent with chronic small vessel ischemic disease, moderate to advanced in nature. Superimposed remote lacunar infarcts noted involving the bilateral basal ganglia. Additional small remote lacune noted at the central pons. Tiny remote left cerebellar infarct. Punctate 4 mm focus of restricted diffusion seen involving the cortex of the posterior left frontoparietal region (series 5, image 77). Additional tiny punctate 4-5 mm focus of restricted diffusion seen involving the cortical gray matter of the posterior left parietal lobe (series 5, image 83). Findings consistent with tiny acute  ischemic infarcts, likely embolic. No associated hemorrhage or mass effect. No other evidence for acute or subacute ischemia. Gray-white matter differentiation otherwise maintained. No foci of susceptibility artifact to suggest acute or chronic intracranial hemorrhage. No mass lesion, midline shift or mass effect. Diffuse ventricular prominence related to global parenchymal volume loss without hydrocephalus. No extra-axial fluid collection. Pituitary gland within normal limits. Vascular: Absent flow void within the hypoplastic left vertebral artery, likely occluded. Major intracranial vascular flow voids otherwise maintained. Skull and upper cervical spine: Craniocervical junction within normal limits. Bone marrow signal intensity normal. No scalp soft tissue abnormality. Sinuses/Orbits: Patient status post bilateral ocular lens replacement. Opacification of the left frontoethmoidal sinuses, with moderate mucosal thickening within the left maxillary sinus. Paranasal sinuses are otherwise clear. Trace bilateral mastoid effusions, of doubtful significance. Inner ear structures grossly normal. Other: None. MRA HEAD FINDINGS ANTERIOR CIRCULATION: Distal cervical segments of the internal carotid arteries are patent with symmetric antegrade flow. Petrous segments widely patent bilaterally. Scattered calcified plaque throughout the cavernous/supraclinoid ICAs bilaterally. Associated moderate diffuse narrowing throughout the cavernous left ICA. Associated moderate to severe multifocal narrowing at the anterior genu of the cavernous right ICA. 5 mm focal outpouching extending laterally and slightly posteriorly from the anterior genu of the cavernous right ICA consistent with aneurysm (series 7, image 82). A1 segments patent bilaterally. Normal anterior communicating artery. A2 segments widely patent proximally. Short-segment moderate to severe distal bilateral A2 stenoses noted. There is a focal severe fairly long segment  stenosis extending from the left carotid terminus into the mid left M1 segment with loss of normal flow related signal. Stenosis measures approximately 8 mm in  length. Left M1 segment patent distally. Normal left MCA bifurcation. Left MCA branches well perfused distally. Right M1 irregular but widely patent. Normal right MCA bifurcation. Distal right MCA branches well perfused. POSTERIOR CIRCULATION: Dominant right vertebral artery with hypoplastic left vertebral artery. Faint attenuated flow seen within the left vertebral artery at the skull base, and remains grossly patent to the level of the left PICA left PICA perfused. Distally, left V4 segment is occluded to the vertebrobasilar junction. Dominant right vertebral artery widely patent at the skull base. Irregular approximate 60% stenosis seen involving the mid right V4 segment. Right PICA patent. Mild multifocal irregularity seen throughout the basilar artery which remains widely patent to its distal aspect. Superior cerebral arteries patent bilaterally. Both PCAs primarily supplied via the basilar. Multifocal atheromatous irregularity without high-grade stenosis seen throughout both PCAs. IMPRESSION: MRI HEAD IMPRESSION: 1. Two punctate subcentimeter acute ischemic nonhemorrhagic cortical infarcts involving the left frontoparietal region as above, likely embolic in nature. 2. Underlying moderately advanced cerebral atrophy with chronic small vessel ischemic disease. 3. Left sided paranasal sinus disease as above. MRA HEAD IMPRESSION: 1. Approximate 8 mm severe stenosis extending from the left carotid terminus into the left M1 segment. Left MCA branches are perfused distally. 2. 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 Short-segment approximate 60% right V4 stenosis as above. 3. Extensive atheromatous change throughout the carotid siphons with associated moderate to advanced multifocal stenoses. 4. 5 mm cavernous  right ICA aneurysm as above. Electronically Signed   By: Jeannine Boga M.D.   On: 05/15/2019 02:37   MR BRAIN WO CONTRAST  Result Date: 05/15/2019 CLINICAL DATA:  Initial evaluation for acute altered mental status. EXAM: MRI HEAD WITHOUT CONTRAST MRA HEAD WITHOUT CONTRAST TECHNIQUE: Multiplanar, multiecho pulse sequences of the brain and surrounding structures were obtained without intravenous contrast. Angiographic images of the head were obtained using MRA technique without contrast. COMPARISON:  Prior CT from 05/14/2019. FINDINGS: MRI HEAD FINDINGS Brain: Moderately advanced cerebral atrophy. Patchy and confluent T2/FLAIR hyperintensity within the periventricular deep white matter both cerebral hemispheres most consistent with chronic small vessel ischemic disease, moderate to advanced in nature. Superimposed remote lacunar infarcts noted involving the bilateral basal ganglia. Additional small remote lacune noted at the central pons. Tiny remote left cerebellar infarct. Punctate 4 mm focus of restricted diffusion seen involving the cortex of the posterior left frontoparietal region (series 5, image 77). Additional tiny punctate 4-5 mm focus of restricted diffusion seen involving the cortical gray matter of the posterior left parietal lobe (series 5, image 83). Findings consistent with tiny acute ischemic infarcts, likely embolic. No associated hemorrhage or mass effect. No other evidence for acute or subacute ischemia. Gray-white matter differentiation otherwise maintained. No foci of susceptibility artifact to suggest acute or chronic intracranial hemorrhage. No mass lesion, midline shift or mass effect. Diffuse ventricular prominence related to global parenchymal volume loss without hydrocephalus. No extra-axial fluid collection. Pituitary gland within normal limits. Vascular: Absent flow void within the hypoplastic left vertebral artery, likely occluded. Major intracranial vascular flow voids  otherwise maintained. Skull and upper cervical spine: Craniocervical junction within normal limits. Bone marrow signal intensity normal. No scalp soft tissue abnormality. Sinuses/Orbits: Patient status post bilateral ocular lens replacement. Opacification of the left frontoethmoidal sinuses, with moderate mucosal thickening within the left maxillary sinus. Paranasal sinuses are otherwise clear. Trace bilateral mastoid effusions, of doubtful significance. Inner ear structures grossly normal. Other: None. MRA HEAD FINDINGS ANTERIOR CIRCULATION: Distal  cervical segments of the internal carotid arteries are patent with symmetric antegrade flow. Petrous segments widely patent bilaterally. Scattered calcified plaque throughout the cavernous/supraclinoid ICAs bilaterally. Associated moderate diffuse narrowing throughout the cavernous left ICA. Associated moderate to severe multifocal narrowing at the anterior genu of the cavernous right ICA. 5 mm focal outpouching extending laterally and slightly posteriorly from the anterior genu of the cavernous right ICA consistent with aneurysm (series 7, image 82). A1 segments patent bilaterally. Normal anterior communicating artery. A2 segments widely patent proximally. Short-segment moderate to severe distal bilateral A2 stenoses noted. There is a focal severe fairly long segment stenosis extending from the left carotid terminus into the mid left M1 segment with loss of normal flow related signal. Stenosis measures approximately 8 mm in length. Left M1 segment patent distally. Normal left MCA bifurcation. Left MCA branches well perfused distally. Right M1 irregular but widely patent. Normal right MCA bifurcation. Distal right MCA branches well perfused. POSTERIOR CIRCULATION: Dominant right vertebral artery with hypoplastic left vertebral artery. Faint attenuated flow seen within the left vertebral artery at the skull base, and remains grossly patent to the level of the left PICA  left PICA perfused. Distally, left V4 segment is occluded to the vertebrobasilar junction. Dominant right vertebral artery widely patent at the skull base. Irregular approximate 60% stenosis seen involving the mid right V4 segment. Right PICA patent. Mild multifocal irregularity seen throughout the basilar artery which remains widely patent to its distal aspect. Superior cerebral arteries patent bilaterally. Both PCAs primarily supplied via the basilar. Multifocal atheromatous irregularity without high-grade stenosis seen throughout both PCAs. IMPRESSION: MRI HEAD IMPRESSION: 1. Two punctate subcentimeter acute ischemic nonhemorrhagic cortical infarcts involving the left frontoparietal region as above, likely embolic in nature. 2. Underlying moderately advanced cerebral atrophy with chronic small vessel ischemic disease. 3. Left sided paranasal sinus disease as above. MRA HEAD IMPRESSION: 1. Approximate 8 mm severe stenosis extending from the left carotid terminus into the left M1 segment. Left MCA branches are perfused distally. 2. 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 Short-segment approximate 60% right V4 stenosis as above. 3. Extensive atheromatous change throughout the carotid siphons with associated moderate to advanced multifocal stenoses. 4. 5 mm cavernous right ICA aneurysm as above. Electronically Signed   By: Jeannine Boga M.D.   On: 05/15/2019 02:37   US RENAL  Result Date: 05/15/2019 CLINICAL DATA:  Acute kidney injury EXAM: RENAL / URINARY TRACT ULTRASOUND COMPLETE COMPARISON:  CT abdomen pelvis 05/14/2019 FINDINGS: Right Kidney: Renal measurements: 8.6 x 5.3 x 3.1 cm = volume: 73.8 mL . Echogenicity within normal limits. No worrisome mass, shadowing calculus or hydronephrosis visualized. Left Kidney: Renal measurements: 9.4 x 5.3 x 3.6 cm = volume: 94 mL. 0.8 x 0.6 x 0.6 cm anechoic cystic lesion in the interpolar left kidney most compatible  with simple renal cyst corresponding well to a partially exophytic cyst on CT. No worrisome renal lesion. No shadowing calculus or hydronephrosis. Bladder: Appears normal for degree of bladder distention. Other: None. IMPRESSION: Anechoic 8 mm likely simple cyst in the left kidney. Otherwise unremarkable renal ultrasound. Electronically Signed   By: Lovena Le M.D.   On: 05/15/2019 03:49   DG Chest Port 1 View  Result Date: 05/14/2019 CLINICAL DATA:  86 year old female with nonspecific chest pain. EXAM: PORTABLE CHEST 1 VIEW COMPARISON:  None. FINDINGS: There is no focal consolidation, pleural effusion or pneumothorax. The cardiac silhouette is within normal limits. Small left  hilar calcified granuloma. Atherosclerotic calcification of the aorta. No acute osseous pathology. IMPRESSION: No active disease. Electronically Signed   By: Anner Crete M.D.   On: 05/14/2019 21:17   ECHOCARDIOGRAM COMPLETE  Result Date: 05/15/2019    ECHOCARDIOGRAM REPORT   Patient Name:   Madison Martinez Date of Exam: 05/15/2019 Medical Rec #:  [id number]   Height:       64.5 in Accession #:    [id number]  Weight:       143.7 lb Date of Birth:  08/09/1927   BSA:          1.709 m Patient Age:    71 years    BP:           138/52 mmHg Patient Gender: F           HR:           68 bpm. Exam Location:  Inpatient Procedure: 2D Echo Indications:    Stroke I163.9  History:        Patient has no prior history of Echocardiogram examinations.                 Risk Factors:Diabetes, Hypertension and Dyslipidemia.  Sonographer:    Mikki Santee RDCS (AE) Referring Phys: [id number] [place]  1. There is a mass (0.8 cm x 0.5 cm) that appears to be attached to the right coronary cusp of the aortic valve. It does appear calcified and is best seen in the PLAX views. There is no destruction of the valve to suggest vegetation/endocarditis. 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 A TEE is recommended for  better characterization in this patient with stroke. The aortic valve is tricuspid. Aortic valve regurgitation is not visualized. No aortic stenosis is present.  2. Left ventricular ejection fraction, by estimation, is 60 to 65%. The left ventricle has normal function. The left ventricle has no regional wall motion abnormalities. Left ventricular diastolic parameters are indeterminate.  3. Right ventricular systolic function is normal. The right ventricular size is normal. There is normal pulmonary artery systolic pressure. The estimated right ventricular systolic pressure is [id number] mmHg.  4. The mitral valve is degenerative. Mild mitral valve regurgitation. No evidence of mitral stenosis.  5. The inferior vena cava is normal in size with greater than 50% respiratory variability, suggesting right atrial pressure of 3 mmHg. Conclusion(s)/Recommendation(s): Findings concerning for aortic valve mass, would recommend Transesophageal Echocardiogram for clarification. FINDINGS  Left Ventricle: Left ventricular ejection fraction, by estimation, is 60 to 65%. The left ventricle has normal function. The left ventricle has no regional wall motion abnormalities. The left ventricular internal cavity size was normal in size. There is  no left ventricular hypertrophy. Left ventricular diastolic parameters are indeterminate. Right Ventricle: The right ventricular size is normal. No increase in right ventricular wall thickness. Right ventricular systolic function is normal. There is normal pulmonary artery systolic pressure. The tricuspid regurgitant velocity is 2.59 m/s, and  with an assumed right atrial pressure of 3 mmHg, the estimated right ventricular systolic pressure is [id number] mmHg. Left Atrium: Left atrial size was normal in size. Right Atrium: Right atrial size was normal in size. Pericardium: There is no evidence of pericardial effusion. Presence of pericardial fat pad. Mitral Valve: The mitral valve is degenerative in  appearance. Mild mitral annular calcification. Mild mitral valve regurgitation. No evidence of mitral valve stenosis. Tricuspid Valve: The tricuspid valve is grossly normal. Tricuspid valve regurgitation is mild . No  evidence of tricuspid stenosis. Aortic Valve: There is a mass (0.8 cm x 0.5 cm) that appears to be attached to the right coronary cusp of the aortic valve. It does appear calcified and is best seen in the PLAX views. There is no destruction of the valve to suggest vegetation/endocarditis. 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 A TEE is recommended for better characterization in this patient with stroke. The aortic valve is tricuspid. Aortic valve regurgitation is not visualized. No aortic stenosis is present. Pulmonic Valve: The pulmonic valve was grossly normal. Pulmonic valve regurgitation is trivial. Aorta: The aortic root is normal in size and structure. Venous: The inferior vena cava is normal in size with greater than 50% respiratory variability, suggesting right atrial pressure of 3 mmHg. IAS/Shunts: No atrial level shunt detected by color flow Doppler.  LEFT VENTRICLE PLAX 2D LVIDd:         4.80 cm  Diastology LVIDs:         3.20 cm  LV e' lateral:   5.77 cm/s LV PW:         0.90 cm  LV E/e' lateral: 14.4 LV IVS:        1.10 cm  LV e' medial:    5.55 cm/s LVOT diam:     2.00 cm  LV E/e' medial:  14.9 LV SV:         56 LV SV Index:   33 LVOT Area:     3.14 cm  RIGHT VENTRICLE RV Basal diam:  3.40 cm RV Mid diam:    3.40 cm RV S prime:     13.60 cm/s TAPSE (M-mode): 2.2 cm LEFT ATRIUM             Index       RIGHT ATRIUM           Index LA diam:        3.50 cm 2.05 cm/m  RA Area:     16.80 cm LA Vol (A2C):   40.9 ml 23.93 ml/m RA Volume:   47.50 ml  27.79 ml/m LA Vol (A4C):   57.6 ml 33.70 ml/m LA Biplane Vol: 48.8 ml 28.55 ml/m  AORTIC VALVE LVOT Vmax:   84.90 cm/s LVOT Vmean:  54.700 cm/s LVOT VTI:    0.177 m  AORTA Ao Root diam: 2.50 cm MITRAL VALVE                 TRICUSPID VALVE MV Area (PHT): 2.50 cm     TR Peak grad:   26.8 mmHg MV Decel Time: 303 msec     TR Vmax:        259.00 cm/s MV E velocity: 82.90 cm/s MV A velocity: 111.00 cm/s  SHUNTS MV E/A ratio:  0.75         Systemic VTI:  0.18 m                             Systemic Diam: 2.00 cm Eleonore Chiquito MD Electronically signed by Eleonore Chiquito MD Signature Date/Time: 05/15/2019/2:03:10 PM    Final    ECHO TEE  Result Date: 05/17/2019    TRANSESOPHOGEAL ECHO REPORT   Patient Name:   Madison Martinez Date of Exam: 05/17/2019 Medical Rec #:  [id number]   Height:       64.5 in Accession #:    [id number]  Weight:       143.7 lb  Date of Birth:  September 12, 1927   BSA:          1.709 m Patient Age:    48 years    BP:           156/67 mmHg Patient Gender: F           HR:           69 bpm. Exam Location:  Inpatient Procedure: Transesophageal Echo Indications:     Stroke 434.91 / I63.9  History:         Patient has prior history of Echocardiogram examinations, most                  recent 05/15/2019. Risk Factors:Dyslipidemia, Hypertension and                  Diabetes. CVA.  Sonographer:     Clayton Lefort RDCS (AE) Referring Phys:  [id number] [place] Diagnosing Phys: Kirk Ruths MD PROCEDURE: After discussion of the risks and benefits of a TEE, an informed consent was obtained from the patient. The transesophogeal probe was passed without difficulty through the esophogus of the patient. Sedation performed by different physician. Image quality was adequate. The patient developed no complications during the procedure. IMPRESSIONS  1. Normal LV function; moderate LAE; trace AI; mild MR and TR.  2. Left ventricular ejection fraction, by estimation, is 55 to 60%. The left ventricle has normal function. The left ventricle has no regional wall motion abnormalities.  3. Right ventricular systolic function is normal. The right ventricular size is normal.  4. Left atrial size was moderately dilated. No left atrial/left atrial appendage  thrombus was detected.  5. The mitral valve is normal in structure and function. Mild mitral valve regurgitation.  6. The aortic valve is tricuspid. Aortic valve regurgitation is trivial. Mild to moderate aortic valve sclerosis/calcification is present, without any evidence of aortic stenosis.  7. There is Moderate (Grade III) plaque involving the descending aorta.  8. Agitated saline contrast bubble study was negative, with no evidence of any interatrial shunt. FINDINGS  Left Ventricle: Left ventricular ejection fraction, by estimation, is 55 to 60%. The left ventricle has normal function. The left ventricle has no regional wall motion abnormalities. The left ventricular internal cavity size was normal in size. There is  no left ventricular hypertrophy. Right Ventricle: The right ventricular size is normal.Right ventricular systolic function is normal. Left Atrium: Left atrial size was moderately dilated. No left atrial/left atrial appendage thrombus was detected. Right Atrium: Right atrial size was normal in size. Pericardium: There is no evidence of pericardial effusion. Mitral Valve: The mitral valve is normal in structure and function. Mild mitral valve regurgitation. Tricuspid Valve: The tricuspid valve is normal in structure. Tricuspid valve regurgitation is mild. Aortic Valve: The aortic valve is tricuspid. Aortic valve regurgitation is trivial. Mild to moderate aortic valve sclerosis/calcification is present, without any evidence of aortic stenosis. Pulmonic Valve: The pulmonic valve was not well visualized. Pulmonic valve regurgitation is not visualized. Aorta: The aortic root is normal in size and structure. There is moderate (Grade III) plaque involving the descending aorta. IAS/Shunts: No atrial level shunt detected by color flow Doppler. Agitated saline contrast was given intravenously to evaluate for intracardiac shunting. Agitated saline contrast bubble study was negative, with no evidence of any  interatrial shunt. Additional Comments: Normal LV function; moderate LAE; trace AI; mild MR and TR.   AORTA Ao Root diam: 3.00 cm Ao Asc diam:  3.40  cm Kirk Ruths MD Electronically signed by Kirk Ruths MD Signature Date/Time: 05/17/2019/12:40:51 PM    Final    CT HEAD CODE STROKE WO CONTRAST  Result Date: 05/14/2019 CLINICAL DATA:  Code stroke.  Altered mental status EXAM: CT HEAD WITHOUT CONTRAST TECHNIQUE: Contiguous axial images were obtained from the base of the skull through the vertex without intravenous contrast. COMPARISON:  None. FINDINGS: Brain: Generalized atrophy, moderate in degree. Moderate hypodensity throughout the cerebral white matter extending into the left internal capsule compatible chronic ischemia. Negative for acute hemorrhage or mass. No midline shift. Vascular: Atherosclerotic calcification bilaterally. Negative for hyperdense vessel Skull: Negative Sinuses/Orbits: Complete opacification left maxillary sinus with mucosal edema extending into the left frontal and ethmoid sinus. No air-fluid levels. Left maxillary sinus secretions are mildly hyperdense. Negative for orbital mass.  Bilateral cataract extraction Other: None ASPECTS ([place] Stroke Program Early CT Score) - Ganglionic level infarction (caudate, lentiform nuclei, internal capsule, insula, M1-M3 cortex): 7 - Supraganglionic infarction (M4-M6 cortex): 3 Total score (0-10 with 10 being normal): 10 IMPRESSION: 1. No acute abnormality 2. ASPECTS is 10 3. Moderate atrophy and moderate chronic microvascular ischemic change 4. These results were called by telephone at the time of interpretation on 05/14/2019 at 8:27 pm to provider Lindzen , who verbally acknowledged these results. Electronically Signed   By: Franchot Gallo M.D.   On: 05/14/2019 20:28   VAS US CAROTID (at [facility] and [facility] only)  Result Date: 05/15/2019 Carotid Arterial Duplex Study Indications:       CVA. Risk Factors:      Hypertension, hyperlipidemia, Diabetes.  Limitations        Today's exam was limited due to patient movement. Comparison Study:  no prior Performing Technologist: Abram Sander RVS  Examination Guidelines: A complete evaluation includes B-mode imaging, spectral Doppler, color Doppler, and power Doppler as needed of all accessible portions of each vessel. Bilateral testing is considered an integral part of a complete examination. Limited examinations for reoccurring indications may be performed as noted.  Right Carotid Findings: +----------+--------+--------+--------+-------------------------+---------+           PSV cm/sEDV cm/sStenosisPlaque Description       Comments  +----------+--------+--------+--------+-------------------------+---------+ CCA Prox  44      9               heterogenous                       +----------+--------+--------+--------+-------------------------+---------+ CCA Distal57      12              heterogenous             Shadowing +----------+--------+--------+--------+-------------------------+---------+ ICA Prox  91      29      1-39%   heterogenous and calcificShadowing +----------+--------+--------+--------+-------------------------+---------+ ICA Distal95      35                                                 +----------+--------+--------+--------+-------------------------+---------+ ECA       177                                                        +----------+--------+--------+--------+-------------------------+---------+ +----------+--------+-------+--------+-------------------+  PSV cm/sEDV cmsDescribeArm Pressure (mmHG) +----------+--------+-------+--------+-------------------+ [id number]                                         +----------+--------+-------+--------+-------------------+ +---------+--------+--+--------+--+---------+ VertebralPSV cm/s68EDV cm/s23Antegrade +---------+--------+--+--------+--+---------+  Left Carotid Findings:  +----------+--------+--------+--------+------------------------------+---------+           PSV cm/sEDV cm/sStenosisPlaque Description            Comments  +----------+--------+--------+--------+------------------------------+---------+ CCA Prox  38      8               heterogenous                            +----------+--------+--------+--------+------------------------------+---------+ CCA Distal50      10              heterogenous                            +----------+--------+--------+--------+------------------------------+---------+ ICA Prox  69      25      1-39%   heterogenous, calcific and    Shadowing                                   irregular                               +----------+--------+--------+--------+------------------------------+---------+ ICA Distal29      12                                                      +----------+--------+--------+--------+------------------------------+---------+ ECA       99                                                              +----------+--------+--------+--------+------------------------------+---------+ +----------+--------+--------+--------+-------------------+           PSV cm/sEDV cm/sDescribeArm Pressure (mmHG) +----------+--------+--------+--------+-------------------+ Subclavian80                                          +----------+--------+--------+--------+-------------------+ +---------+--------+--+--------+---------+ VertebralPSV cm/s33EDV cm/sAntegrade +---------+--------+--+--------+---------+   Summary: Right Carotid: Velocities in the right ICA are consistent with a 1-39% stenosis. Left Carotid: Velocities in the left ICA are consistent with a 1-39% stenosis. Vertebrals: Bilateral vertebral arteries demonstrate antegrade flow. *See table(s) above for measurements and observations.     Preliminary       Procedures: TEE   Subjective: Patient is feeling better,  tolerating po well, no nausea or vomiting, no chest pain or dyspnea, has been able to ambulate with the help of a walker.   Discharge Exam: Vitals:   05/18/19 0406 05/18/19 0736  BP: (!) 125/55 (!) 176/74  Pulse: 70 65  Resp: 18 18  Temp: 97.6 F (36.4 C) (!) 97.4 F (36.3  C)  SpO2: 95% 97%   Vitals:   05/17/19 1935 05/17/19 2303 05/18/19 0406 05/18/19 0736  BP: (!) 168/91 131/75 (!) 125/55 (!) 176/74  Pulse: 77 66 70 65  Resp: 16 16 18 18   Temp: 99 F (37.2 C) 97.8 F (36.6 C) 97.6 F (36.4 C) (!) 97.4 F (36.3 C)  TempSrc: Oral Oral  Oral  SpO2: 97% 92% 95% 97%  Weight:      Height:        General: Not in pain or dyspnea.  Neurology: Awake and alert, non focal  E ENT: no pallor, no icterus, oral mucosa moist Cardiovascular: No JVD. S1-S2 present, rhythmic, no gallops, rubs, or murmurs. No lower extremity edema. Pulmonary: positive breath sounds bilaterally, adequate air movement, no wheezing, rhonchi or rales. Gastrointestinal. Abdomen with no organomegaly, non tender, no rebound or guarding Skin. No rashes Musculoskeletal: no joint deformities   The results of significant diagnostics from this hospitalization (including imaging, microbiology, ancillary and laboratory) are listed below for reference.     Microbiology: Recent Results (from the past 240 hour(s))  Blood Culture (routine x 2)     Status: None (Preliminary result)   Collection Time: 05/14/19 10:04 PM   Specimen: BLOOD  Result Value Ref Range Status   Specimen Description BLOOD RIGHT ANTECUBITAL  Final   Special Requests   Final    BOTTLES DRAWN AEROBIC AND ANAEROBIC Blood Culture adequate volume   Culture   Final    NO GROWTH 4 DAYS Performed at [facility], [address] [place], [place] [address]    Report Status PENDING  Incomplete  Blood Culture (routine x 2)     Status: None (Preliminary result)   Collection Time: 05/14/19 11:10 PM   Specimen: BLOOD LEFT FOREARM  Result Value Ref  Range Status   Specimen Description BLOOD LEFT FOREARM  Final   Special Requests   Final    BOTTLES DRAWN AEROBIC AND ANAEROBIC Blood Culture adequate volume   Culture   Final    NO GROWTH 4 DAYS Performed at [facility], [place] [address], [place], [place] [address]    Report Status PENDING  Incomplete  SARS CORONAVIRUS 2 (TAT 6-24 HRS) Nasopharyngeal Nasopharyngeal Swab     Status: None   Collection Time: 05/14/19 11:25 PM   Specimen: Nasopharyngeal Swab  Result Value Ref Range Status   SARS Coronavirus 2 NEGATIVE NEGATIVE Final    Comment: (NOTE) SARS-CoV-2 target nucleic acids are NOT DETECTED. The SARS-CoV-2 RNA is generally detectable in upper and lower respiratory specimens during the acute phase of infection. Negative results do not preclude SARS-CoV-2 infection, do not rule out co-infections with other pathogens, and should not be used as the sole basis for treatment or other patient management decisions. Negative results must be combined with clinical observations, patient history, and epidemiological information. The expected result is Negative. Fact Sheet for Patients: SugarRoll.be Fact Sheet for Healthcare Providers: https://www.woods-mathews.com/ This test is not yet approved or cleared by the [place] FDA and  has been authorized for detection and/or diagnosis of SARS-CoV-2 by FDA under an Emergency Use Authorization (EUA). This EUA will remain  in effect (meaning this test can be used) for the duration of the COVID-19 declaration under Section 56 4(b)(1) of the Act, 21 U.S.C. section 360bbb-3(b)(1), unless the authorization is terminated or revoked sooner. Performed at [facility], [place] [address] [place], [place] [address]      Labs: BNP (last  3 results) No results for input(s): BNP in the last 8760 hours. Basic Metabolic Panel: Recent Labs  Lab 05/14/19 2012 05/14/19 2012 05/14/19 2021  05/15/19 1002 05/16/19 0028 05/17/19 0336 05/18/19 0251  NA 130*   < > 130* 137 135 142 138  K 4.1   < > 4.1 3.7 4.0 4.7 4.2  CL 96*   < > 96* 105 105 109 106  CO2 22  --   --  22 20* 23 21*  GLUCOSE 96   < > 92 129* 109* 131* 145*  BUN 48*   < > 45* 36* 29* 24* 21  CREATININE 2.96*   < > 3.10* 1.92* 1.44* 1.27* 1.09*  CALCIUM 9.2  --   --  9.0 9.0 9.8 9.5   < > = values in this interval not displayed.   Liver Function Tests: Recent Labs  Lab 05/14/19 2012  AST 30  ALT 16  ALKPHOS 68  BILITOT 0.6  PROT 6.6  ALBUMIN 3.4*   No results for input(s): LIPASE, AMYLASE in the last 168 hours. Recent Labs  Lab 05/14/19 2325  AMMONIA 18   CBC: Recent Labs  Lab 05/14/19 2012 05/14/19 2021 05/15/19 1002 05/17/19 0336 05/18/19 0251  WBC 23.1*  --  17.4* 12.1* 12.9*  NEUTROABS 14.8*  --   --  5.3 5.8  HGB 12.4 12.2 11.7* 11.4* 11.6*  HCT 37.3 36.0 36.4 34.6* 36.6  MCV 95.2  --  95.8 94.3 97.9  PLT 198  --  184 174 202   Cardiac Enzymes: Recent Labs  Lab 05/15/19 1002  CKTOTAL 117   BNP: Invalid input(s): POCBNP CBG: Recent Labs  Lab 05/14/19 2011 05/15/19 0932 05/15/19 1228  GLUCAP 91 87 105*   D-Dimer No results for input(s): DDIMER in the last 72 hours. Hgb A1c No results for input(s): HGBA1C in the last 72 hours. Lipid Profile No results for input(s): CHOL, HDL, LDLCALC, TRIG, CHOLHDL, LDLDIRECT in the last 72 hours. Thyroid function studies No results for input(s): TSH, T4TOTAL, T3FREE, THYROIDAB in the last 72 hours.  Invalid input(s): FREET3 Anemia work up No results for input(s): VITAMINB12, FOLATE, FERRITIN, TIBC, IRON, RETICCTPCT in the last 72 hours. Urinalysis No results found for: COLORURINE, APPEARANCEUR, [place], [place], [place], [place], [place], [place], PROTEINUR, UROBILINOGEN, NITRITE, LEUKOCYTESUR Sepsis Labs Invalid input(s): PROCALCITONIN,  WBC,  LACTICIDVEN Microbiology Recent Results (from the past 240 hour(s))  Blood  Culture (routine x 2)     Status: None (Preliminary result)   Collection Time: 05/14/19 10:04 PM   Specimen: BLOOD  Result Value Ref Range Status   Specimen Description BLOOD RIGHT ANTECUBITAL  Final   Special Requests   Final    BOTTLES DRAWN AEROBIC AND ANAEROBIC Blood Culture adequate volume   Culture   Final    NO GROWTH 4 DAYS Performed at [facility], [place] [address], [place], [place] [address]    Report Status PENDING  Incomplete  Blood Culture (routine x 2)     Status: None (Preliminary result)   Collection Time: 05/14/19 11:10 PM   Specimen: BLOOD LEFT FOREARM  Result Value Ref Range Status   Specimen Description BLOOD LEFT FOREARM  Final   Special Requests   Final    BOTTLES DRAWN AEROBIC AND ANAEROBIC Blood Culture adequate volume   Culture   Final    NO GROWTH 4 DAYS Performed at [facility], [place] [address] [place], [place] [address]    Report Status PENDING  Incomplete  SARS CORONAVIRUS 2 (TAT 6-24 HRS) Nasopharyngeal Nasopharyngeal Swab     Status: None   Collection Time: 05/14/19 11:25 PM   Specimen: Nasopharyngeal Swab  Result Value Ref Range Status   SARS Coronavirus 2 NEGATIVE NEGATIVE Final    Comment: (NOTE) SARS-CoV-2 target nucleic acids are NOT DETECTED. The SARS-CoV-2 RNA is generally detectable in upper and lower respiratory specimens during the acute phase of infection. Negative results do not preclude SARS-CoV-2 infection, do not rule out co-infections with other pathogens, and should not be used as the sole basis for treatment or other patient management decisions. Negative results must be combined with clinical observations, patient history, and epidemiological information. The expected result is Negative. Fact Sheet for Patients: SugarRoll.be Fact Sheet for Healthcare Providers: https://www.woods-mathews.com/ This test is not yet approved or cleared by the [place] FDA and  has been  authorized for detection and/or diagnosis of SARS-CoV-2 by FDA under an Emergency Use Authorization (EUA). This EUA will remain  in effect (meaning this test can be used) for the duration of the COVID-19 declaration under Section 56 4(b)(1) of the Act, 21 U.S.C. section 360bbb-3(b)(1), unless the authorization is terminated or revoked sooner. Performed at [facility], [place] [address] [place], [place] [address]      Time coordinating discharge: 45 minutes  SIGNED:   Tawni Millers, MD  [facility] 05/18/2019, 10:15 AM

## 2019-05-18 NOTE — TOC Transition Note (Addendum)
Transition of Care [facility]) - CM/SW Discharge Note   Patient Details  Name: Madison Martinez MRN: [id number] Date of Birth: 1928-02-20  Transition of Care [facility]) CM/SW Contact:  Carles Collet, RN Phone Number: 05/18/2019, 11:18 AM   Clinical Narrative:    Spoke to patient and son. Patient lives w son. Plan for DC to home, would like [facility], referral made. Will need [facility] transport. Forms printed and placed on chart. Verified address with son who will be home to let her in the house.  [facility] and [facility] both declined. [facility] accepted. [facility] called for 1pm.   Final next level of care: Home w Home Health Services Barriers to Discharge: No Barriers Identified   Patient Goals and CMS Choice Patient states their goals for this hospitalization and ongoing recovery are:: to go home CMS Medicare.gov Compare Post Acute Care list provided to:: Other (Comment Required) Choice offered to / list presented to : Adult Children  Discharge Placement                       Discharge Plan and Services                          HH Arranged: RN, PT, OT [facility] Agency: [place] Date [place]: 05/18/19 Time [place]: 1118 Representative spoke with at [place]: [place] ([place]) Interventions     Readmission Risk Interventions No flowsheet data found.

## 2019-05-18 NOTE — Progress Notes (Signed)
Patient discharged by [facility].  Telemetry d/c, peripheral IV removed, AVS provided to patient with discharge information explained. All questions answered and addresed.

## 2019-05-18 NOTE — Progress Notes (Signed)
Physical Therapy Treatment Patient Details Name: Madison Martinez MRN: [id number] DOB: 1928-03-04 Today's Date: 05/18/2019    History of Present Illness Patient is an 86 year old female admitted from home with AMS. Fall prior to admission. MRI reveals 2 subcentimeter acute ashemic cortical infarcts. PMH includes: Blind in L eye, HOH, HTN, HLD, DM    PT Comments    Pt supine in bed on arrival.  Focused on OOB mobility with progression to stair training.  Pt performed increased gt distance.  She was supervision initially but on the way back she was more fatigued and required min guard assistance.  From a home level she will not need to ambulate this far so she should function well in the home.  Pt performed stair training with cues for use of R rail and sequencing.  Stair training completed and patient awaiting instruction from nursing before returning home.     Follow Up Recommendations  Home health PT;Supervision for mobility/OOB     Equipment Recommendations  None recommended by PT    Recommendations for Other Services       Precautions / Restrictions Precautions Precautions: Fall Precaution Comments: HOH Restrictions Weight Bearing Restrictions: No    Mobility  Bed Mobility Overal bed mobility: Needs Assistance Bed Mobility: Supine to Sit;Sit to Supine     Supine to sit: Modified independent (Device/Increase time) Sit to supine: Modified independent (Device/Increase time)   General bed mobility comments: no assist needed, +rail  Transfers Overall transfer level: Needs assistance Equipment used: Rolling walker (2 wheeled) Transfers: Sit to/from Stand Sit to Stand: Modified independent (Device/Increase time)         General transfer comment: no assist needed  Ambulation/Gait Ambulation/Gait assistance: Min guard Gait Distance (Feet): 250 Feet Assistive device: Rolling walker (2 wheeled) Gait Pattern/deviations: Step-through pattern;Decreased stride length;Trunk  flexed Gait velocity: WFL   General Gait Details: Cues for scapular retraction and upper trunk control.  Pt required cues to use B LEs more and avoid pressing so hard on device.  Pt more fatigued with increased distance.   Stairs Stairs: Yes Stairs assistance: Min guard Stair Management: One rail Right Number of Stairs: 4 General stair comments: Cues for sequencing and hand placement on railing.  Pt required cues to lead with weak leg when descending for improved outcome.   Wheelchair Mobility    Modified Rankin (Stroke Patients Only) Modified Rankin (Stroke Patients Only) Pre-Morbid Rankin Score: No symptoms Modified Rankin: Moderately severe disability     Balance Overall balance assessment: Needs assistance Sitting-balance support: Feet supported Sitting balance-Leahy Scale: Good     Standing balance support: No upper extremity supported;During functional activity Standing balance-Leahy Scale: Fair Standing balance comment: able to complete standing grooming tasks at sink with no UE support                            Cognition Arousal/Alertness: Awake/alert Behavior During Therapy: WFL for tasks assessed/performed Overall Cognitive Status: Within Functional Limits for tasks assessed                                 General Comments: WFL for basic tasks, requires some repetition however pt is very [facility]      Exercises      General Comments        Pertinent Vitals/Pain Pain Assessment: No/denies pain Faces Pain Scale: Hurts little more Pain Location:  B feet Pain Descriptors / Indicators: Sore;Discomfort Pain Intervention(s): Monitored during session;Repositioned    Home Living                      Prior Function            PT Goals (current goals can now be found in the care plan section) Acute Rehab PT Goals Patient Stated Goal: to return home, feel better Potential to Achieve Goals: Good Progress towards PT goals:  Progressing toward goals    Frequency    Min 4X/week      PT Plan Current plan remains appropriate    Co-evaluation              AM-PAC PT "6 Clicks" Mobility   Outcome Measure  Help needed turning from your back to your side while in a flat bed without using bedrails?: A Little Help needed moving from lying on your back to sitting on the side of a flat bed without using bedrails?: A Little Help needed moving to and from a bed to a chair (including a wheelchair)?: A Little Help needed standing up from a chair using your arms (e.g., wheelchair or bedside chair)?: A Little Help needed to walk in hospital room?: A Little Help needed climbing 3-5 steps with a railing? : A Little 6 Click Score: 18    End of Session Equipment Utilized During Treatment: Gait belt Activity Tolerance: Patient tolerated treatment well Patient left: with call bell/phone within reach;in chair;with chair alarm set Nurse Communication: Mobility status PT Visit Diagnosis: Unsteadiness on feet (R26.81);Difficulty in walking, not elsewhere classified (R26.2);Muscle weakness (generalized) (M62.81);History of falling (Z91.81);Pain Pain - Right/Left: (Bilateral) Pain - part of body: Ankle and joints of foot     Time: 1153-1210 PT Time Calculation (min) (ACUTE ONLY): 17 min  Charges:  $Gait Training: 8-22 mins                     Erasmo Leventhal , PTA Acute Rehabilitation Services Pager [phone] Office [phone]     Ji Feldner Eli Hose 05/18/2019, 2:26 PM

## 2019-05-19 LAB — CULTURE, BLOOD (ROUTINE X 2)
Culture: NO GROWTH
Culture: NO GROWTH
Special Requests: ADEQUATE
Special Requests: ADEQUATE

## 2019-05-20 ENCOUNTER — Telehealth: Payer: Self-pay

## 2019-05-20 NOTE — Telephone Encounter (Signed)
Spoke with patients son, Max.   Transition Care Management Follow-up Telephone Call  Admission Dates: 05/14/2019-05/18/2019 Dx: CVA   How have you been since you were released from the hospital? "She's doing real good"   Do you understand why you were in the hospital? yes   Do you understand the discharge instructions? yes   Where were you discharged to? Home. Resides with son.    Items Reviewed:  Medications reviewed: yes  Allergies reviewed: yes  Dietary changes reviewed: yes  Referrals reviewed: yes.    Functional Questionnaire:   Activities of Daily Living (ADLs):   She states they are independent in the following: ambulation, bathing and hygiene, feeding, continence, grooming, toileting and dressing. Uses walker for ambulation. HH ordered to assist.  States they require assistance with the following: none.    Any transportation issues/concerns?: yes, son cannot currently drive d/t back injury   Any patient concerns? no   Confirmed importance and date/time of follow-up visits scheduled yes  Provider Appointment booked with PCP 05/23/2019 @ 1130, [place]. (son cannot drive currently)  Confirmed with patient if condition begins to worsen call PCP or go to the ER.  Patient was given the office number and encouraged to call back with question or concerns.  : yes

## 2019-05-20 NOTE — Telephone Encounter (Signed)
Received home health orders to delay [facility] for patient. Dr Raoul Pitch has not seen patient since 11/30/2018. Nurse called from [facility] [place] and she was told that we have not seen patient for 6 months and there was no hospital F/U done. She was going to call pts family and let them know she needs appt with Dr Raoul Pitch.   Sent to Dr Raoul Pitch as a Juluis Rainier

## 2019-05-23 ENCOUNTER — Encounter: Payer: Self-pay | Admitting: Family Medicine

## 2019-05-23 ENCOUNTER — Other Ambulatory Visit: Payer: Self-pay

## 2019-05-23 ENCOUNTER — Ambulatory Visit (INDEPENDENT_AMBULATORY_CARE_PROVIDER_SITE_OTHER): Payer: Medicare Other | Admitting: Family Medicine

## 2019-05-23 VITALS — Ht 64.5 in

## 2019-05-23 DIAGNOSIS — Z87448 Personal history of other diseases of urinary system: Secondary | ICD-10-CM

## 2019-05-23 DIAGNOSIS — Z8673 Personal history of transient ischemic attack (TIA), and cerebral infarction without residual deficits: Secondary | ICD-10-CM

## 2019-05-23 DIAGNOSIS — N183 Chronic kidney disease, stage 3 unspecified: Secondary | ICD-10-CM | POA: Diagnosis not present

## 2019-05-23 DIAGNOSIS — K529 Noninfective gastroenteritis and colitis, unspecified: Secondary | ICD-10-CM

## 2019-05-23 DIAGNOSIS — I359 Nonrheumatic aortic valve disorder, unspecified: Secondary | ICD-10-CM

## 2019-05-23 DIAGNOSIS — R2681 Unsteadiness on feet: Secondary | ICD-10-CM

## 2019-05-23 DIAGNOSIS — Z7901 Long term (current) use of anticoagulants: Secondary | ICD-10-CM

## 2019-05-23 DIAGNOSIS — I700 Atherosclerosis of aorta: Secondary | ICD-10-CM | POA: Insufficient documentation

## 2019-05-23 DIAGNOSIS — I671 Cerebral aneurysm, nonruptured: Secondary | ICD-10-CM

## 2019-05-23 DIAGNOSIS — K8020 Calculus of gallbladder without cholecystitis without obstruction: Secondary | ICD-10-CM

## 2019-05-23 DIAGNOSIS — I63512 Cerebral infarction due to unspecified occlusion or stenosis of left middle cerebral artery: Secondary | ICD-10-CM

## 2019-05-23 DIAGNOSIS — N179 Acute kidney failure, unspecified: Secondary | ICD-10-CM

## 2019-05-23 DIAGNOSIS — G3184 Mild cognitive impairment, so stated: Secondary | ICD-10-CM

## 2019-05-23 DIAGNOSIS — Z9181 History of falling: Secondary | ICD-10-CM

## 2019-05-23 DIAGNOSIS — K5732 Diverticulitis of large intestine without perforation or abscess without bleeding: Secondary | ICD-10-CM

## 2019-05-23 DIAGNOSIS — E785 Hyperlipidemia, unspecified: Secondary | ICD-10-CM

## 2019-05-23 DIAGNOSIS — I10 Essential (primary) hypertension: Secondary | ICD-10-CM | POA: Diagnosis not present

## 2019-05-23 DIAGNOSIS — G319 Degenerative disease of nervous system, unspecified: Secondary | ICD-10-CM

## 2019-05-23 DIAGNOSIS — R531 Weakness: Secondary | ICD-10-CM

## 2019-05-23 DIAGNOSIS — N838 Other noninflammatory disorders of ovary, fallopian tube and broad ligament: Secondary | ICD-10-CM

## 2019-05-23 DIAGNOSIS — I739 Peripheral vascular disease, unspecified: Secondary | ICD-10-CM

## 2019-05-23 DIAGNOSIS — I358 Other nonrheumatic aortic valve disorders: Secondary | ICD-10-CM

## 2019-05-23 DIAGNOSIS — R9401 Abnormal electroencephalogram [EEG]: Secondary | ICD-10-CM

## 2019-05-23 DIAGNOSIS — H9190 Unspecified hearing loss, unspecified ear: Secondary | ICD-10-CM

## 2019-05-23 MED ORDER — ASPIRIN 325 MG PO TBEC: 325.0000 mg | DELAYED_RELEASE_TABLET | Freq: Every day | ORAL | 0 refills | Status: DC

## 2019-05-23 MED ORDER — CLOPIDOGREL BISULFATE 75 MG PO TABS: 75.0000 mg | ORAL_TABLET | Freq: Every day | ORAL | 3 refills | Status: AC

## 2019-05-23 MED ORDER — METOPROLOL SUCCINATE ER 25 MG PO TB24: 25.0000 mg | ORAL_TABLET | Freq: Every day | ORAL | 1 refills | Status: DC

## 2019-05-23 NOTE — Progress Notes (Signed)
VIRTUAL VISIT VIA VIDEO  I connected with Madison Martinez on 05/23/19 at 11:30 AM EST by a video enabled telemedicine application and verified that I am speaking with the correct person using two identifiers. Location patient: Home Location provider: [facility], Office Persons participating in the virtual visit: Patient, Dr. Raoul Pitch and R.Baker, LPN  I discussed the limitations of evaluation and management by telemedicine and the availability of in person appointments. The patient expressed understanding and agreed to proceed.   Madison Martinez , February 14, 1928, 86 y.o., female MRN: [id number] Patient Care Team    Relationship Specialty Notifications Start End  Ma Hillock, DO PCP - General Family Medicine  09/01/16   Verdell Carmine, MD Referring Physician Internal Medicine  09/01/16   Renelda Loma, OD  Optometry  09/01/16   [facility], [facility]  Ophthalmology  06/16/17   Manuela Neptune, DPM Referring Physician Podiatry  06/16/17     Chief Complaint  Patient presents with  . Hospitalization Follow-up    Pt son is living with patient and she is able to do everything without assistance. Son states she is doing well but is concerned when she is unable to take of herself      Subjective:  Madison Martinez  is a 86 y.o. female presents for hospital follow up after recent admission on 05/14/2019 for primary diagnosis encephalopathy.  Patient was discharged on 05/18/2019 to home with home health. Patients discharge summary has been reviewed, as well as all labs/image studies obtained during hospitalization.   Patients hospital course: History obtained from patient, her son and hospital notes.  Patient presented to the emergency room with difficulty speaking and altered mental status after her son found her in a state of confusion and unable to follow commands on February 23.  Emergency room her vitals were stable but patient was noted to be disoriented but nonfocal.  She was found to have acute  kidney injury with a creatinine of 2.96 and a GFR of 13.  Her baseline GFR is approximately 45 and baseline creatinine 1.13.  New onset hyponatremia of 130 with hypochloremia of 96.  Evaded WBC of 23.1 with left shift 14.8.  Cultures were negative.  Since hospital discharge patient reports overall she is doing well.  Her son states she still has some weakness and a mildly unsteady gait.  He himself is having difficulty with his back and is having difficulty helping her.  He has concerns about her being able to completely care for herself and he is unable to assist her currently.  He is staying with her at this time and can help her with the routine functions but is unable to help her move or lift. Patient reports she has been taking the Plavix daily.  There is some question if she has continued the aspirin daily-but she thinks she has.  The report be antibiotics have 1 to 2 days left and she is feeling great relief in her abdomen.  She has not had a bowel movement yesterday or today.  She is passing gas.  She denies any fever, chills, nausea or vomit.  She reports she is eating and drinking well.  She is tolerating the antibiotics.  They are uncertain if she has her neurology appointment scheduled.   CT ABD 05/14/2019: Reproductive: The uterus is grossly unremarkable. There is a 2.5 x 2.5 cm high attenuating structure associated with the right ovary which is not characterized on this CT. This may  represent normal ovarian tissue or an ovarian lesion. Evaluation of the pelvic structures is very limited due to streak artifact caused by bilateral metallic hip arthroplasties. Further evaluation with ultrasound on a nonemergent/outpatient basis recommended IMPRESSION: 1. Colitis involving the descending colon, likely inflammatory/infectious in etiology. Correlation with clinical exam and stool cultures recommended. No bowel obstruction. Normal appendix. 2. Sigmoid diverticulosis. 3. Cholelithiasis. 4.  Advanced Aortic Atherosclerosis (ICD10-I70.0).  EEG-05/15/2019 IMPRESSION: This study is suggestive of non specific cortical dysfunction in left frontotemporal region. No seizures or definite epileptiform discharges were seen throughout the recording. [place]   MR ANGIO HEAD WO CONTRAST- 05/15/2019 IMPRESSION: 1. Two punctate subcentimeter acute ischemic nonhemorrhagic cortical infarcts involving the left frontoparietal region as above, likely embolic in nature. 2. Underlying moderately advanced cerebral atrophy with chronic small vessel ischemic disease.  MR BRAIN WO CONTRAST- 05/15/2019 IMPRESSION: 1. Approximate 8 mm severe stenosis extending from the left carotid terminus into the left M1 segment. Left MCA branches are perfused distally. 2. 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 Short-segment approximate 60% right V4 stenosis as above. 3. Extensive atheromatous change throughout the carotid siphons with associated moderate to advanced multifocal stenoses. 4. 5 mm cavernous right ICA aneurysm .  US RENAL-05/15/2019 Right Kidney: Renal measurements: 8.6 x 5.3 x 3.1 cm = volume: 73.8 mL . Echogenicity within normal limits. No worrisome mass, shadowing calculus or hydronephrosis visualized.  Left Kidney: Renal measurements: 9.4 x 5.3 x 3.6 cm = volume: 94 mL. 0.8 x 0.6 x 0.6 cm anechoic cystic lesion in the interpolar left kidney most compatible with simple renal cyst corresponding well to a partially exophytic cyst on CT. No worrisome renal lesion. No shadowing calculus or hydronephrosis.  Bladder: Appears normal for degree of bladder distention.  IMPRESSION: Anechoic 8 mm likely simple cyst in the left kidney. Otherwise unremarkable renal ultrasound.   DG Chest Port 1 View-05/14/2019 FINDINGS: There is no focal consolidation, pleural effusion or pneumothorax. The cardiac silhouette is within normal limits. Small left hilar calcified granuloma.  Atherosclerotic calcification of the aorta. No acute osseous pathology. IMPRESSION: No active disease.   ECHOCARDIOGRAM COMPLETE-05/15/2019 IMPRESSIONS  1. There is a mass (0.8 cm x 0.5 cm) that appears to be attached to the right coronary cusp of the aortic valve. It does appear calcified and is best seen in the PLAX views. There is no destruction of the valve to suggest vegetation/endocarditis. 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 A TEE is recommended for better characterization in this patient with stroke.          ECHO TEE- 05/17/2019 IMPRESSIONS   Normal LV function; moderate LAE; trace AI; mild MR and TR.   Left ventricular ejection fraction, by estimation, is 55 to 60%. The left ventricle has normal function. The left ventricle has no regional wall motion abnormalities.   Right ventricular systolic function is normal. The right ventricular size is normal. Aortic Valve: The aortic valve is tricuspid. Aortic valve regurgitation is trivial. Mild to moderate aortic valve sclerosis/calcification is present, without any evidence of aortic stenosis. Aorta: The aortic root is normal in size and structure. There is moderate (Grade III) plaque involving the descending aorta.  05/20/2019-Carotid Arterial Duplex Study  Summary: Right Carotid: Velocities in the right ICA are consistent with a 1-39% stenosis. Left Carotid: Velocities in the left ICA are consistent with a 1-39% stenosis. Vertebrals: Bilateral vertebral arteries demonstrate antegrade flow.  Recent Labs  Lab 05/17/19 0336 05/18/19 0251  HGB 11.4* 11.6*  HCT 34.6* 36.6  WBC 12.1* 12.9*  PLT 174 202   CMP Latest Ref Rng & Units 05/18/2019 05/17/2019 05/16/2019  Glucose 70 - 99 mg/dL 145(H) 131(H) 109(H)  BUN 8 - 23 mg/dL 21 24(H) 29(H)  Creatinine 0.44 - 1.00 mg/dL 1.09(H) 1.27(H) 1.44(H)  Sodium 135 - 145 mmol/L 138 142 135  Potassium 3.5 - 5.1 mmol/L 4.2 4.7 4.0  Chloride 98 - 111 mmol/L 106 109 105   CO2 22 - 32 mmol/L 21(L) 23 20(L)  Calcium 8.9 - 10.3 mg/dL 9.5 9.8 9.0  Total Protein 6.5 - 8.1 g/dL - - -  Total Bilirubin 0.3 - 1.2 mg/dL - - -  Alkaline Phos 38 - 126 U/L - - -  AST 15 - 41 U/L - - -  ALT 0 - 44 U/L - - -    Depression screen [facility] 2/9 08/03/2018 08/03/2018 05/25/2018 11/24/2017 06/16/2017  Decreased Interest 0 0 0 0 0  Down, Depressed, Hopeless 0 0 0 0 0  PHQ - 2 Score 0 0 0 0 0    Allergies  Allergen Reactions  . Amlodipine Besylate Other (See Comments)    Vertigo  . Flu Virus Vaccine Swelling    Site of swelling not known by son   Social History   Tobacco Use  . Smoking status: Never Smoker  . Smokeless tobacco: Never Used  Substance Use Topics  . Alcohol use: No   Past Medical History:  Diagnosis Date  . Blind hypertensive eye, left 04/18/2012  . Changing skin lesion 11/23/2018  . Gait disturbance 10/05/2015   pt declined neuro referral per records  . Hard of hearing 12/02/2015   hearing aids  . Hyperlipidemia   . Hypertension   . Insomnia    "tried melatonin, advil PM, xanax and klonopin without success"  . Leukocytosis 11/07/2013   seen oncology, did not want to continue to follow there, so continued follow up at PCP.  . Osteoporosis   . Polyneuropathy   . Primary open angle glaucoma 01/2011  . Spinal stenosis   . Stable branch retinal vein occlusion 01/06/2012  . Type 2 diabetes mellitus ([place])    "diet controlled"   Past Surgical History:  Procedure Laterality Date  . BUBBLE STUDY  05/17/2019   Procedure: BUBBLE STUDY;  Surgeon: Lelon Perla, MD;  [facility] ENDOSCOPY;  Service: Cardiovascular;;  . EYE SURGERY    . [place]  2005  . TEE WITHOUT CARDIOVERSION N/A 05/17/2019   Procedure: TRANSESOPHAGEAL ECHOCARDIOGRAM (TEE);  Surgeon: Lelon Perla, MD;  Location: [facility] ENDOSCOPY;  Service: Cardiovascular;  Laterality: N/A;  . TOTAL HIP ARTHROPLASTY Left 1991  . TOTAL HIP ARTHROPLASTY Right ?   x 2   Family History  Problem  Relation Age of Onset  . Heart disease Brother    Allergies as of 05/23/2019      Reactions   Amlodipine Besylate Other (See Comments)   Vertigo   Flu Virus Vaccine Swelling   Site of swelling not known by son      Medication List       Accurate as of May 23, 2019  1:51 PM. If you have any questions, ask your nurse or doctor.        STOP taking these medications   amoxicillin-clavulanate 500-125 MG tablet Commonly known as: AUGMENTIN Stopped by: Howard Pouch, DO   NON FORMULARY Stopped by: Howard Pouch, DO   Hardin Negus  Stool Softener 100 MG capsule Generic drug: docusate sodium Stopped by: Howard Pouch, DO     TAKE these medications   aspirin 325 MG EC tablet Take 1 tablet (325 mg total) by mouth daily. Take only for 3 months.   clopidogrel 75 MG tablet Commonly known as: PLAVIX Take 1 tablet (75 mg total) by mouth daily.   dorzolamide-timolol 22.3-6.8 MG/ML ophthalmic solution Commonly known as: COSOPT Place 1 drop into the left eye 2 (two) times daily.   FLEXI JOINT RELIEF PLUS PO Take 2 capsules by mouth daily.   glipiZIDE 2.5 MG 24 hr tablet Commonly known as: GLUCOTROL XL TAKE 1 TABLET BY MOUTH  DAILY WITH BREAKFAST   hydrALAZINE 25 MG tablet Commonly known as: APRESOLINE TAKE 1 TABLET BY MOUTH  EVERY 8 HOURS. What changed: additional instructions   KRILL OIL PO Take 1 capsule by mouth daily.   lisinopril 30 MG tablet Commonly known as: ZESTRIL TAKE 1 TABLET BY MOUTH  DAILY   Melatonin 5 MG Tabs Take 5 mg by mouth at bedtime as needed (for sleep).   metoprolol succinate 25 MG 24 hr tablet Commonly known as: TOPROL-XL Take 1 tablet (25 mg total) by mouth daily.   NON FORMULARY Take 2 capsules by mouth See admin instructions. UriVArx - Clinically Proven Bladder Control Capsules: Take 2 capsules by mouth once a [place]   One-A-Oren Womens 50 Plus Tabs Take 1 tablet by mouth daily.   polyethylene glycol powder 17 GM/SCOOP powder Commonly known as:  GLYCOLAX/MIRALAX Take 17 g by mouth daily as needed for mild constipation.   pravastatin 10 MG tablet Commonly known as: PRAVACHOL Take 1 tablet (10 mg total) by mouth daily.   Vitamin D-3 25 MCG (1000 UT) Caps Take 1,000 Units by mouth daily.   Vyzulta 0.024 % Soln Generic drug: Latanoprostene Bunod Place 1 drop into the left eye at bedtime.       All past medical history, surgical history, allergies, family history, immunizations and medications were updated in the EMR today and reviewed under the history and medication portions of their EMR.      ROS: Negative, with the exception of above mentioned in HPI  Objective:  Ht 5' 4.5" (1.638 m)   BMI 24.29 kg/m  Body mass index is 24.29 kg/m. Gen: Afebrile. No acute distress. Nontoxic in appearance, well developed, well nourished.  Pleasant Caucasian female. HENT: AT. [place]. Eyes:Pupils Equal Round Reactive to light, Extraocular movements intact,  Conjunctiva without redness, discharge or icterus. Chest: No cough or shortness of breath Neuro: Reported unsteady gait and weakness. PERLA. EOMi. Alert. Oriented x3  Psych: Normal affect, dress and demeanor. Normal speech. Normal thought content and judgment.  Assessment/Plan: Elvia Brosch Elem is a 86 y.o. female present for OV for Hospital discharge follow up  Stage 3 chronic kidney disease/AKI -AKI resolved by hospital discharge. Follow-up every 6 months  Colitis/sigmoid diverticulitis Patient reports she is recovering well.  Has another Mroz left of her antibiotics.  Is having some constipation now. -Discussed MiraLAX taper she has some at home but has not started taking it. -Discussed importance of having routine bowel movements and ensuring passing gas.  Calculus of gallbladder: Couple small gallbladder stones.  Patient asymptomatic.  Will monitor for any symptoms otherwise would not encourage surgical consult with her comorbid conditions and 86 years old.  Cerebrovascular  accident (CVA) due to stenosis of left middle cerebral artery (HCC)/Aneurysm of internal carotid artery/small vessel disease/cerebral atrophy/abnormal EEG/chronic anticoagulation Seem to be  uncertain if they have a neurology follow-up therefore will place referral today to [facility]. Patient was counseled to take aspirin 325 mg daily along with Plavix 75 mg daily.  She understands today to take both of these for 3 months> she will continue Plavix daily.  Essential hypertension/hyperlipidemia Stable. Continue hydralazine 25 mg 3 times daily Continue lisinopril 30 mg daily Continue metoprolol daily Continue pravastatin 10 mg daily Follow-up every 6 months  cognitive impairment/high risk for falls/weakness/hard of hearing/gait instability -Home health was ordered at hospital discharge.  It has not been able to be scheduled as of yet secondary to the not been able to get a hold of patient. We will arrange for PT/OT eval and treat. We order aide to assist in ADLs at least in the short-term I patient is building strength. Will order social worker to address patient and son's long-term concerns about her being able to care for herself in the home.  Aortic valve mass/Aortic atherosclerosis (HCC)/aortic calcification -Referral to cardiology was placed for recommendations.  Elevated TSH: -We will obtain TSH level at next follow-up appointment.  Very mildly elevated during hospital stay with acute illness.  Ovarian mass, right We will arrange for nonemergent ultrasound of her pelvis at her next follow-up appointment.  Currently she has many specialty appointments and likely physical therapy to attend.    Reviewed expectations re: course of current medical issues.  Discussed self-management of symptoms.  Outlined signs and symptoms indicating need for more acute intervention.  Patient verbalized understanding and all questions were answered.  Patient received an  After-Visit Summary.  Any changes in medications were reviewed and patient was provided with updated med list with their AVS.      Orders Placed This Encounter  Procedures  . Ambulatory referral to Neurology  . Ambulatory referral to Cardiology   Meds ordered this encounter  Medications  . clopidogrel (PLAVIX) 75 MG tablet    Sig: Take 1 tablet (75 mg total) by mouth daily.    Dispense:  90 tablet    Refill:  3  . metoprolol succinate (TOPROL-XL) 25 MG 24 hr tablet    Sig: Take 1 tablet (25 mg total) by mouth daily.    Dispense:  90 tablet    Refill:  1    Referral Orders     Ambulatory referral to Neurology     Ambulatory referral to Cardiology  Note is dictated utilizing voice recognition software. Although note has been proof read prior to signing, occasional typographical errors still can be missed. If any questions arise, please do not hesitate to call for verification.   electronically signed by:  Howard Pouch, DO  [place]

## 2019-05-29 NOTE — Addendum Note (Signed)
Addended by: Caroll Rancher L on: 05/29/2019 09:59 AM   Modules accepted: Orders

## 2019-06-20 ENCOUNTER — Telehealth: Payer: Self-pay

## 2019-06-20 NOTE — Telephone Encounter (Signed)
Left message for patient's son to confirm whether patient is receiving home health services. If not does she still need home health services.

## 2019-06-24 ENCOUNTER — Inpatient Hospital Stay: Payer: PPO | Admitting: Adult Health

## 2019-06-24 ENCOUNTER — Telehealth: Payer: Self-pay

## 2019-06-24 NOTE — Telephone Encounter (Signed)
[name] - Client TELEPHONE ADVICE RECORD [facility] Patient Name: Madison Martinez Gender: Female DOB: 06-Nov-1927 Age: 86 Y 10 M 17 D Return Phone Number: [phone] (Primary) Address: City/State/Zip: [place] [address] Client [facility] [place] [facility] - Client Client Site [place] - [place] Physician Raoul Pitch, [place] Contact Type Call Who Is Calling Patient / Member / Family / Caregiver Call Type Triage / Clinical Caller Name Max Lilia Pro Relationship To Patient Son Return Phone Number [phone] (Primary) Chief Complaint Blood Pressure High  Reason for Call Symptomatic / Request for [place] states mothers blood pressure has been high. She is on medication but doesn't think its working.  150/78. A month ago she had a minor stroke. Nonurgent per charge. Translation No Nurse Assessment Nurse: Claiborne Billings, RN, Kim Date/Time (Eastern Time): 06/20/2019 5:03:57 PM Confirm and document reason for call. If symptomatic, describe symptoms.  ---Caller states his mother is on medication for BP but her BP has been running high. States her BP a couple of hours ago and it was 150/78. States no CP, headache, nausea or other sxs. States mother had a minor stroke one month ago.  Has the patient had close contact with a person known or suspected to have the novel coronavirus illness OR traveled / lives in area with major community spread (including international travel) in the last 14 days from the onset of symptoms? * If Asymptomatic, screen for exposure and travel within the last 14 days. ---No Does the patient have any new or worsening symptoms? ---Yes Will a triage be completed? ---Yes Related visit to physician within the last 2 weeks? ---No Does the PT have any chronic conditions? (i.e. diabetes, asthma, this includes High risk factors for pregnancy, etc.) ---Yes List chronic conditions. ---HTN, s/p  stroke one month ago, Diabetes Is this a behavioral health or substance abuse call? ---No Guidelines Guideline Title Affirmed Question Affirmed Notes Nurse Date/Time (Eastern Time) Blood Pressure - High [id number] Systolic BP >= [id number] OR Diastolic >= 80 AND [id number] taking BP medications Suzette Battiest 06/20/2019 5:06:19 PMPLEASE NOTE: All timestamps contained within this report are represented as [place] Standard Time. CONFIDENTIALTY NOTICE: This fax transmission is intended only for the addressee. It contains information that is legally privileged, confidential or otherwise protected from use or disclosure. If you are not the intended recipient, you are strictly prohibited from reviewing, disclosing, copying using or disseminating any of this information or taking any action in reliance on or regarding this information. If you have received this fax in error, please notify us immediately by telephone so that we can arrange for its return to [place]. Phone: [phone], Toll-Free: [phone], Fax: [phone] Page: 2 of 2 Call Id: [id number] [place]. Time Eilene Ghazi Time) Disposition Final User 06/20/2019 5:09:28 PM See PCP within 2 Weeks Yes Claiborne Billings, RN, Max Sane Disagree/Comply Comply Caller Understands Yes PreDisposition Call Doctor Care Advice Given Per Guideline SEE PCP WITHIN 2 WEEKS: * You need to be seen for this ongoing problem within the next 2 weeks. Call your doctor (or NP/ PA) during regular office hours and make an appointment. REASSURANCE AND EDUCATION: * Your blood pressure is elevated but you have told me that you are not having any symptoms. * You should see your doctor and have your blood pressure checked within 2 weeks. * You might need to have an adjustment in your medication(s). LIFESTYLE MODIFICATIONS: * The following things can help you reduce your blood pressure. *  EAT HEALTHY: Eat a diet rich in fresh fruits and vegetables, dietary fiber, nonanimal protein (e.g., soy), and low-fat dairy  products. Avoid foods with a high content of saturated fat or cholesterol. * DECREASE SODIUM INTAKE: Aim to eat less than 2.4 g (100 mmol) of sodium each [place]. Unfortunately 75% of the salt in the average person's diet is in pre-processed foods. * REDUCE STRESS: Find activities that help reduce your stress. Examples might include meditation, yoga, or even a restful walk in a park. CALL BACK IF: * Weakness or numbness of the face, arm or leg on one side of the body occurs * Chest pain or difficulty breathing occurs * You become worse. CARE ADVICE given per High Blood Pressure (Adult) guideline.

## 2019-06-24 NOTE — Telephone Encounter (Signed)
Pts son was called and he states her BP has been running fine the past two days >130/80, but still would like in office appt to review BP's. Per son pt is feeling great, no dizziness or headaches. Low sodium diet being followed per son. Son advised to call back if blood pressure was elevated again, he refused sooner appt. Appt made for Thursday morning.   Son was asked about cardiology referral and he states he is not sure if he had gotten a phone call from then but they do not have appt that he is aware of. Diane this referral was placed 05/23/2019, can we check on that?

## 2019-06-24 NOTE — Telephone Encounter (Signed)
Son called after hours, states [facility] services is not needed at this time.   [facility] - Client Nonclinical Telephone Record  [facility] Client [place] [facility] - Client Client Site [place] - [place] Physician Raoul Pitch, [place] Type Call Who Is Calling Patient / Member / Family / Caregiver Caller Name Zigmund Daniel Phone Number [phone] Patient Name Madison Martinez Patient DOB 12/12/1927 Call Type Message Only Information Provided Reason for Call Request for General Office Information Initial Comment Caller states returning call from yesterday about [place]. At this time it is not needed. Additional Comment Thank You. Disp. Time Disposition Final User 06/21/2019 1:11:19 PM General Information Provided Yes Margaretmary Bayley Call Closed By: Margaretmary Bayley Transaction Date/Time: 06/21/2019 1:08:37 PM (ET

## 2019-06-24 NOTE — Telephone Encounter (Signed)
Home Health Referral cancelled

## 2019-06-24 NOTE — Telephone Encounter (Signed)
Sent to Dr Raoul Pitch as an Juluis Rainier

## 2019-06-26 NOTE — Telephone Encounter (Signed)
Left detailed message that patient has cardiology appointment at [facility] 07/10/19 with Dr. Stanford Breed.

## 2019-06-27 ENCOUNTER — Ambulatory Visit (INDEPENDENT_AMBULATORY_CARE_PROVIDER_SITE_OTHER): Payer: Medicare Other | Admitting: Family Medicine

## 2019-06-27 ENCOUNTER — Other Ambulatory Visit: Payer: Self-pay

## 2019-06-27 ENCOUNTER — Encounter: Payer: Self-pay | Admitting: Family Medicine

## 2019-06-27 ENCOUNTER — Ambulatory Visit: Payer: Medicare Other | Admitting: Family Medicine

## 2019-06-27 VITALS — BP 173/76 | HR 73 | Temp 98.4°F | Ht 64.5 in | Wt 134.4 lb

## 2019-06-27 DIAGNOSIS — D72820 Lymphocytosis (symptomatic): Secondary | ICD-10-CM

## 2019-06-27 DIAGNOSIS — E119 Type 2 diabetes mellitus without complications: Secondary | ICD-10-CM | POA: Diagnosis not present

## 2019-06-27 DIAGNOSIS — E785 Hyperlipidemia, unspecified: Secondary | ICD-10-CM

## 2019-06-27 DIAGNOSIS — N838 Other noninflammatory disorders of ovary, fallopian tube and broad ligament: Secondary | ICD-10-CM

## 2019-06-27 DIAGNOSIS — R7989 Other specified abnormal findings of blood chemistry: Secondary | ICD-10-CM

## 2019-06-27 DIAGNOSIS — I10 Essential (primary) hypertension: Secondary | ICD-10-CM

## 2019-06-27 DIAGNOSIS — Z7901 Long term (current) use of anticoagulants: Secondary | ICD-10-CM | POA: Diagnosis not present

## 2019-06-27 DIAGNOSIS — R935 Abnormal findings on diagnostic imaging of other abdominal regions, including retroperitoneum: Secondary | ICD-10-CM

## 2019-06-27 DIAGNOSIS — I700 Atherosclerosis of aorta: Secondary | ICD-10-CM

## 2019-06-27 DIAGNOSIS — N183 Chronic kidney disease, stage 3 unspecified: Secondary | ICD-10-CM

## 2019-06-27 DIAGNOSIS — I63512 Cerebral infarction due to unspecified occlusion or stenosis of left middle cerebral artery: Secondary | ICD-10-CM

## 2019-06-27 LAB — CBC WITH DIFFERENTIAL/PLATELET
Basophils Absolute: 0.1 10*3/uL (ref 0.0–0.1)
Basophils Relative: 0.6 % (ref 0.0–3.0)
Eosinophils Absolute: 0.3 10*3/uL (ref 0.0–0.7)
Eosinophils Relative: 2.5 % (ref 0.0–5.0)
HCT: 35.9 % — ABNORMAL LOW (ref 36.0–46.0)
Hemoglobin: 12.1 g/dL (ref 12.0–15.0)
Lymphocytes Relative: 30.4 % (ref 12.0–46.0)
Lymphs Abs: 3.1 10*3/uL (ref 0.7–4.0)
MCHC: 33.8 g/dL (ref 30.0–36.0)
MCV: 93.9 fl (ref 78.0–100.0)
Monocytes Absolute: 0.5 10*3/uL (ref 0.1–1.0)
Monocytes Relative: 5 % (ref 3.0–12.0)
Neutro Abs: 6.3 10*3/uL (ref 1.4–7.7)
Neutrophils Relative %: 61.5 % (ref 43.0–77.0)
Platelets: 184 10*3/uL (ref 150.0–400.0)
RBC: 3.82 Mil/uL — ABNORMAL LOW (ref 3.87–5.11)
RDW: 13.6 % (ref 11.5–15.5)
WBC: 10.2 10*3/uL (ref 4.0–10.5)

## 2019-06-27 LAB — T4, FREE: Free T4: 0.68 ng/dL (ref 0.60–1.60)

## 2019-06-27 LAB — TSH: TSH: 5.1 u[IU]/mL — ABNORMAL HIGH (ref 0.35–4.50)

## 2019-06-27 MED ORDER — LISINOPRIL 40 MG PO TABS: 40.0000 mg | ORAL_TABLET | Freq: Every day | ORAL | 1 refills | Status: DC

## 2019-06-27 MED ORDER — CLOPIDOGREL BISULFATE 75 MG PO TABS: 75.0000 mg | ORAL_TABLET | Freq: Every day | ORAL | 3 refills | Status: DC

## 2019-06-27 MED ORDER — PRAVASTATIN SODIUM 10 MG PO TABS: 10.0000 mg | ORAL_TABLET | Freq: Every day | ORAL | 3 refills | Status: DC

## 2019-06-27 NOTE — Patient Instructions (Addendum)
Continue hydralazine 25 mg every 8 hours.  We increased your lisinopril to 40 mg a [place]. A new prescription has been ordered. Once received stop lisinopril 30 mg and start lisinopril 40 mg a [place].  Continue metoprolol.  continue pravastatin.  Continue aspirin 325 mg and plavix (clopidogrel) daily.    - On May 27 th you will stop the aspirin daily, but you will continue clopidogrel.   Please schedule your appt with neurology for follow up.   You will be receiving a call to schedule an ultrasound to check on your ovary from the abnormal CT in the hospital.    Follow up in 5.5 mos.

## 2019-06-27 NOTE — Progress Notes (Signed)
Madison Martinez , 09-02-1927, 86 y.o., female MRN: [id number] Patient Care Team    Relationship Specialty Notifications Start End  Ma Hillock, DO PCP - General Family Medicine  09/01/16   Verdell Carmine, MD Referring Physician Internal Medicine  09/01/16   Renelda Loma, OD  Optometry  09/01/16   [facility]ates, [facility]  Ophthalmology  06/16/17   Manuela Neptune, DPM Referring Physician Podiatry  06/16/17     Chief Complaint  Patient presents with  . Hypertension     Subjective:  Madison Martinez  is a 86 y.o. female presents for Essential hypertension/hyperlipidemia/CKD3/CVA/aortic atherosclerosis/chronic anticoagulation Pt reports  compliance with Metoprolol-XL 25 mg daily, hydralazine 25 mg TID  and lisinopril 30 mg daily. Patient denies chest pain, shortness of breath, dizziness or lower extremity edema.  She is taking statin and has added Krill oil.  She reports compliance with Plavix and 325 mg ASA.  She has not followed with neurology as of yet s/p stroke. Diet: Monitors her diet, low-sodium Exercise: Does not exercise much, walks with a cane RF: Hypertension, hyperlipidemia, family history of heart disease  Type 2 diabetes mellitus without complication, without long-term current use of insulin (HCC) Pt reports compliance with glipizide 2.5 mg daily, tolerating well. Patient denies dizziness, hyperglycemic or hypoglycemic events. Patient denies numbness, tingling in the extremities or nonhealing wounds of feet.  PNA series: She has declined Pneumovax Flu shot: She declines flu shot (recommneded yearly) Foot exam: We will complete at next visit Eye exam: Yearly eye exams encouraged A1c: 6.6> 5.5 05/15/2019   Monoclonal B-cell lymphocytosis She is suppose to follow with her heme/onc every 3 months- but has not in some time. CBC has been stable here and she would like to continue following cbc here.   Elevated TSH: Patient of TSH during her hospital stay 05/14/2019 with a  TSH of 4.92.  Ovarian mass: Incidental finding during hospital stay with outpatient nonemergent ultrasound encouraged.  Patient does not have abdominal pain or pelvic pain.  CT ABD 05/14/2019: Reproductive: The uterus is grossly unremarkable. There is a 2.5 x 2.5 cm high attenuating structure associated with the right ovary which is not characterized on this CT. This may represent normal ovarian tissue or an ovarian lesion. Evaluation of the pelvic structures is very limited due to streak artifact caused by bilateral metallic hip arthroplasties. Further evaluation with ultrasound on a nonemergent/outpatient basis recommended IMPRESSION: 1. Colitis involving the descending colon, likely inflammatory/infectious in etiology. Correlation with clinical exam and stool cultures recommended. No bowel obstruction. Normal appendix. 2. Sigmoid diverticulosis. 3. Cholelithiasis. 4. Advanced Aortic Atherosclerosis (ICD10-I70.0).  EEG-05/15/2019 IMPRESSION: This study is suggestive of non specific cortical dysfunction in left frontotemporal region. No seizures or definite epileptiform discharges were seen throughout the recording. [place]   MR ANGIO HEAD WO CONTRAST- 05/15/2019 IMPRESSION: 1. Two punctate subcentimeter acute ischemic nonhemorrhagic cortical infarcts involving the left frontoparietal region as above, likely embolic in nature. 2. Underlying moderately advanced cerebral atrophy with chronic small vessel ischemic disease.  MR BRAIN WO CONTRAST- 05/15/2019 IMPRESSION: 1. Approximate 8 mm severe stenosis extending from the left carotid terminus into the left M1 segment. Left MCA branches are perfused distally. 2. 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 Short-segment approximate 60% right V4 stenosis as above. 3. Extensive atheromatous change throughout the carotid siphons with associated moderate to advanced multifocal stenoses. 4. 5 mm  cavernous right ICA aneurysm .  US RENAL-05/15/2019 Right Kidney: Renal measurements: 8.6 x 5.3 x 3.1 cm = volume: 73.8 mL . Echogenicity within normal limits. No worrisome mass, shadowing calculus or hydronephrosis visualized.  Left Kidney: Renal measurements: 9.4 x 5.3 x 3.6 cm = volume: 94 mL. 0.8 x 0.6 x 0.6 cm anechoic cystic lesion in the interpolar left kidney most compatible with simple renal cyst corresponding well to a partially exophytic cyst on CT. No worrisome renal lesion. No shadowing calculus or hydronephrosis.  Bladder: Appears normal for degree of bladder distention.  IMPRESSION: Anechoic 8 mm likely simple cyst in the left kidney. Otherwise unremarkable renal ultrasound.    ECHOCARDIOGRAM COMPLETE-05/15/2019 IMPRESSIONS  1. There is a mass (0.8 cm x 0.5 cm) that appears to be attached to the right coronary cusp of the aortic valve. It does appear calcified and is best seen in the PLAX views. There is no destruction of the valve to suggest vegetation/endocarditis. 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 A TEE is recommended for better characterization in this patient with stroke.          ECHO TEE- 05/17/2019 IMPRESSIONS   Normal LV function; moderate LAE; trace AI; mild MR and TR.   Left ventricular ejection fraction, by estimation, is 55 to 60%. The left ventricle has normal function. The left ventricle has no regional wall motion abnormalities.   Right ventricular systolic function is normal. The right ventricular size is normal. Aortic Valve: The aortic valve is tricuspid. Aortic valve regurgitation is trivial. Mild to moderate aortic valve sclerosis/calcification is present, without any evidence of aortic stenosis. Aorta: The aortic root is normal in size and structure. There is moderate (Grade III) plaque involving the descending aorta.  05/20/2019-Carotid Arterial Duplex Study  Summary: Right Carotid: Velocities in the right ICA are consistent  with a 1-39% stenosis. Left Carotid: Velocities in the left ICA are consistent with a 1-39% stenosis. Vertebrals: Bilateral vertebral arteries demonstrate antegrade flow.   Depression screen [facility] 2/9 08/03/2018 08/03/2018 05/25/2018 11/24/2017 06/16/2017  Decreased Interest 0 0 0 0 0  Down, Depressed, Hopeless 0 0 0 0 0  PHQ - 2 Score 0 0 0 0 0    Allergies  Allergen Reactions  . Amlodipine Besylate Other (See Comments)    Vertigo  . Flu Virus Vaccine Swelling    Site of swelling not known by son   Social History   Tobacco Use  . Smoking status: Never Smoker  . Smokeless tobacco: Never Used  Substance Use Topics  . Alcohol use: No   Past Medical History:  Diagnosis Date  . Blind hypertensive eye, left 04/18/2012  . Changing skin lesion 11/23/2018  . Gait disturbance 10/05/2015   pt declined neuro referral per records  . Generalized weakness 05/15/2019  . Hard of hearing 12/02/2015   hearing aids  . Hyperlipidemia   . Hypertension   . Insomnia    "tried melatonin, advil PM, xanax and klonopin without success"  . Leukocytosis 11/07/2013   seen oncology, did not want to continue to follow there, so continued follow up at PCP.  . Osteoporosis   . Polyneuropathy   . Primary open angle glaucoma 01/2011  . Spinal stenosis   . Stable branch retinal vein occlusion 01/06/2012  . Type 2 diabetes mellitus ([place])    "diet controlled"   Past Surgical History:  Procedure Laterality Date  . BUBBLE STUDY  05/17/2019   Procedure: BUBBLE STUDY;  Surgeon: Lelon Perla, MD;  Location[facility]  ENDOSCOPY;  Service: Cardiovascular;;  . EYE SURGERY    . [place]  2005  . TEE WITHOUT CARDIOVERSION N/A 05/17/2019   Procedure: TRANSESOPHAGEAL ECHOCARDIOGRAM (TEE);  Surgeon: Lelon Perla, MD;  Location: [facility] ENDOSCOPY;  Service: Cardiovascular;  Laterality: N/A;  . TOTAL HIP ARTHROPLASTY Left 1991  . TOTAL HIP ARTHROPLASTY Right ?   x 2   Family History  Problem Relation Age of Onset  .  Heart disease Brother    Allergies as of 06/27/2019      Reactions   Amlodipine Besylate Other (See Comments)   Vertigo   Flu Virus Vaccine Swelling   Site of swelling not known by son      Medication List       Accurate as of June 27, 2019 11:59 PM. If you have any questions, ask your nurse or doctor.        aspirin 325 MG EC tablet Take 1 tablet (325 mg total) by mouth daily. Take only for 3 months.   clopidogrel 75 MG tablet Commonly known as: PLAVIX Take 1 tablet (75 mg total) by mouth daily. Started by: Howard Pouch, DO   dorzolamide-timolol 22.3-6.8 MG/ML ophthalmic solution Commonly known as: COSOPT Place 1 drop into the left eye 2 (two) times daily.   FLEXI JOINT RELIEF PLUS PO Take 2 capsules by mouth daily.   glipiZIDE 2.5 MG 24 hr tablet Commonly known as: GLUCOTROL XL TAKE 1 TABLET BY MOUTH  DAILY WITH BREAKFAST   hydrALAZINE 25 MG tablet Commonly known as: APRESOLINE TAKE 1 TABLET BY MOUTH  EVERY 8 HOURS. What changed: additional instructions   KRILL OIL PO Take 1 capsule by mouth daily.   lisinopril 40 MG tablet Commonly known as: ZESTRIL Take 1 tablet (40 mg total) by mouth daily. What changed:   medication strength  how much to take Changed by: Howard Pouch, DO   melatonin 5 MG Tabs Take 5 mg by mouth at bedtime as needed (for sleep).   metoprolol succinate 25 MG 24 hr tablet Commonly known as: TOPROL-XL Take 1 tablet (25 mg total) by mouth daily.   NON FORMULARY Take 2 capsules by mouth See admin instructions. UriVArx - Clinically Proven Bladder Control Capsules: Take 2 capsules by mouth once a [place]   One-A-[place] Womens 50 Plus Tabs Take 1 tablet by mouth daily.   polyethylene glycol powder 17 GM/SCOOP powder Commonly known as: GLYCOLAX/MIRALAX Take 17 g by mouth daily as needed for mild constipation.   pravastatin 10 MG tablet Commonly known as: PRAVACHOL Take 1 tablet (10 mg total) by mouth daily.   Vitamin D-3 25 MCG (1000 UT)  Caps Take 1,000 Units by mouth daily.   Vyzulta 0.024 % Soln Generic drug: Latanoprostene Bunod Place 1 drop into the left eye at bedtime.       All past medical history, surgical history, allergies, family history, immunizations and medications were updated in the EMR today and reviewed under the history and medication portions of their EMR.      ROS: Negative, with the exception of above mentioned in HPI  Objective:  BP (!) 173/76 (BP Location: Left Arm, Patient Position: Sitting, Cuff Size: Normal)   Pulse 73   Temp 98.4 F (36.9 C) (Temporal)   Ht 5' 4.5" (1.638 m)   Wt 134 lb 6.4 oz (61 kg)   SpO2 98%   BMI 22.71 kg/m  Body mass index is 22.71 kg/m. Gen: Afebrile. No acute distress.  Patient looks rather well  today, happy and talkative. HENT: AT. [place].  Eyes:Pupils Equal Round Reactive to light, Extraocular movements intact,  Conjunctiva without redness, discharge or icterus. Neck/lymp/endocrine: Supple, no lymphadenopathy, no thyromegaly CV: RRR no murmur, no edema, +2/4 P posterior tibialis pulses Chest: CTAB, no wheeze or crackles Abd: Soft. NTND. BS present.  No masses palpated.  Skin: No rashes, purpura or petechiae.  Neuro:  Normal gait. PERLA. EOMi. Alert. Oriented x3 Psych: Normal affect, dress and demeanor. Normal speech. Normal thought content and judgment   Assessment/Plan: Madison Martinez is a 86 y.o. female present for OV for  Stage 3 chronic kidney disease/AKI -AKI resolved by hospital discharge. Avoid NSAIDs Renally dose meds when appropriate Follow-up every 6 months  Monoclonal B-cell lymphocytosis: CBC collected today.  CBC has been stable.  Patient has declined follow-up with heme-onc.  Cerebrovascular accident (CVA) due to stenosis of left middle cerebral artery (HCC)/Aneurysm of internal carotid artery/small vessel disease/cerebral atrophy/abnormal EEG/chronic anticoagulation Patient no showed for her neurology appointment.  Encouraged him to  reschedule ASAP. Patient was counseled to take aspirin 325 mg daily along with Plavix 75 mg daily.  She understands today to take both of these until May 27 in which she will stop the aspirin.  SHe is to continue Plavix indefinitely. CBC collected today  Essential hypertension/hyperlipidemia Above goal. Continue hydralazine 25 mg 3 times daily Increase lisinopril 30 mg> 80 mg daily Continue metoprolol daily Continue pravastatin 10 mg daily Follow-up every 6 months . Aortic valve mass/Aortic atherosclerosis (HCC)/aortic calcification -Referral to cardiology was placed for recommendations.  Elevated TSH: -We will obtain TSH level and T4 today. -We will discuss treatment plan depending upon those results.  Ovarian mass, right Discussed findings with patient and her son today again. They are agreeable to ultrasound follow-up which has been ordered for her.    Reviewed expectations re: course of current medical issues.  Discussed self-management of symptoms.  Outlined signs and symptoms indicating need for more acute intervention.  Patient verbalized understanding and all questions were answered.  Patient received an After-Visit Summary.  Any changes in medications were reviewed and patient was provided with updated med list with their AVS.      Orders Placed This Encounter  Procedures  . US Pelvic Complete With Transvaginal  . TSH  . T4, free  . CBC w/Diff   Meds ordered this encounter  Medications  . clopidogrel (PLAVIX) 75 MG tablet    Sig: Take 1 tablet (75 mg total) by mouth daily.    Dispense:  90 tablet    Refill:  3  . lisinopril (ZESTRIL) 40 MG tablet    Sig: Take 1 tablet (40 mg total) by mouth daily.    Dispense:  90 tablet    Refill:  1  . pravastatin (PRAVACHOL) 10 MG tablet    Sig: Take 1 tablet (10 mg total) by mouth daily.    Dispense:  90 tablet    Refill:  3    Discontinue prior statin scripts by all other providers. Send this script per  timing of when her next refill is due.   Referral Orders  No referral(s) requested today    Note is dictated utilizing voice recognition software. Although note has been proof read prior to signing, occasional typographical errors still can be missed. If any questions arise, please do not hesitate to call for verification.   electronically signed by:  Howard Pouch, DO  [place]

## 2019-07-01 ENCOUNTER — Encounter: Payer: Self-pay | Admitting: Family Medicine

## 2019-07-02 ENCOUNTER — Ambulatory Visit (INDEPENDENT_AMBULATORY_CARE_PROVIDER_SITE_OTHER): Payer: Medicare Other

## 2019-07-02 ENCOUNTER — Other Ambulatory Visit: Payer: Self-pay

## 2019-07-02 DIAGNOSIS — R935 Abnormal findings on diagnostic imaging of other abdominal regions, including retroperitoneum: Secondary | ICD-10-CM

## 2019-07-04 ENCOUNTER — Telehealth: Payer: Self-pay | Admitting: Family Medicine

## 2019-07-04 DIAGNOSIS — N859 Noninflammatory disorder of uterus, unspecified: Secondary | ICD-10-CM | POA: Insufficient documentation

## 2019-07-04 DIAGNOSIS — N838 Other noninflammatory disorders of ovary, fallopian tube and broad ligament: Secondary | ICD-10-CM

## 2019-07-04 NOTE — Telephone Encounter (Signed)
Pts son was called and given results and instructions, he verbalized understanding

## 2019-07-04 NOTE — Progress Notes (Signed)
Referring-Renee Kuneff DO Reason for referral-aortic valve mass  HPI: 86 year old female for evaluation of aortic valve mass at request of [place].  Patient recently admitted with CVA.  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  Carotid Dopplers February 2021 showed 1 to 39% bilateral stenosis.  Transesophageal echocardiogram showed normal LV function, moderate left atrial enlargement, sclerotic aortic valve with trace aortic insufficiency, mild mitral and tricuspid regurgitation.  No mass identified.  There was moderate plaque in the descending aorta; saline microcavitation study negative.  MRA February 2021 showed severe stenosis extending from the left carotid terminus into the left M1 segment, extensive atheromatous change throughout the carotid siphons with moderate to advanced multifocal stenoses and 5 mm cavernous sinus ICA aneurysm.  Patient has dyspnea with more vigorous activities.  She does have limited mobility.  She denies dyspnea with routine activities, orthopnea, PND, pedal edema, chest pain, palpitations or syncope.  Current Outpatient Medications  Medication Sig Dispense Refill  . aspirin 325 MG EC tablet Take 1 tablet (325 mg total) by mouth daily. Take only for 3 months. 60 tablet 0  . Ca Carb-Glucos-Chond-Phelloden (FLEXI JOINT RELIEF PLUS PO) Take 2 capsules by mouth daily.    . Cholecalciferol (VITAMIN D-3) 25 MCG (1000 UT) CAPS Take 1,000 Units by mouth daily.    . clopidogrel (PLAVIX) 75 MG tablet Take 1 tablet (75 mg total) by mouth daily. 90 tablet 3  . dorzolamide-timolol (COSOPT) 22.3-6.8 MG/ML ophthalmic solution Place 1 drop into the left eye 2 (two) times daily.     Marland Kitchen glipiZIDE (GLUCOTROL XL) 2.5 MG 24 hr tablet TAKE 1 TABLET BY MOUTH  DAILY WITH BREAKFAST (Patient taking differently: Take 2.5 mg by mouth daily with breakfast. ) 90 tablet 3  . hydrALAZINE (APRESOLINE) 25 MG  tablet TAKE 1 TABLET BY MOUTH  EVERY 8 HOURS. (Patient taking differently: Take 25 mg by mouth every 8 (eight) hours. Marland Kitchen) 270 tablet 3  . KRILL OIL PO Take 1 capsule by mouth daily.     Marland Kitchen lisinopril (ZESTRIL) 40 MG tablet Take 1 tablet (40 mg total) by mouth daily. 90 tablet 1  . Melatonin 5 MG TABS Take 5 mg by mouth at bedtime as needed (for sleep).    . metoprolol succinate (TOPROL-XL) 25 MG 24 hr tablet Take 1 tablet (25 mg total) by mouth daily. 90 tablet 1  . Multiple Vitamins-Minerals (ONE-A-[place] WOMENS 50 PLUS) TABS Take 1 tablet by mouth daily.    . NON FORMULARY Take 2 capsules by mouth See admin instructions. UriVArx - Clinically Proven Bladder Control Capsules: Take 2 capsules by mouth once a [place]    . polyethylene glycol powder (GLYCOLAX/MIRALAX) 17 GM/SCOOP powder Take 17 g by mouth daily as needed for mild constipation.    . pravastatin (PRAVACHOL) 40 MG tablet Take 1 tablet (40 mg total) by mouth daily. 90 tablet 3  . VYZULTA 0.024 % SOLN Place 1 drop into the left eye at bedtime.      No current facility-administered medications for this visit.    Allergies  Allergen Reactions  . Amlodipine Besylate Other (See Comments)    Vertigo  . Flu Virus Vaccine Swelling    Site of swelling not known by son     Past Medical History:  Diagnosis Date  . Blind hypertensive eye, left 04/18/2012  . Changing skin lesion 11/23/2018  . CVA (cerebral vascular accident) ([place])   . Gait disturbance  10/05/2015   pt declined neuro referral per records  . Generalized weakness 05/15/2019  . Hard of hearing 12/02/2015   hearing aids  . Hyperlipidemia   . Hypertension   . Insomnia    "tried melatonin, advil PM, xanax and klonopin without success"  . Leukocytosis 11/07/2013   seen oncology, did not want to continue to follow there, so continued follow up at PCP.  . Osteoporosis   . Polyneuropathy   . Primary open angle glaucoma 01/2011  . Spinal stenosis   . Stable branch retinal vein  occlusion 01/06/2012  . Type 2 diabetes mellitus ([place])    "diet controlled"    Past Surgical History:  Procedure Laterality Date  . BUBBLE STUDY  05/17/2019   Procedure: BUBBLE STUDY;  Surgeon: Lelon Perla, MD;  Location: [facility] ENDOSCOPY;  Service: Cardiovascular;;  . EYE SURGERY    . [place]  2005  . TEE WITHOUT CARDIOVERSION N/A 05/17/2019   Procedure: TRANSESOPHAGEAL ECHOCARDIOGRAM (TEE);  Surgeon: Lelon Perla, MD;  Location: [facility] ENDOSCOPY;  Service: Cardiovascular;  Laterality: N/A;  . TOTAL HIP ARTHROPLASTY Left 1991  . TOTAL HIP ARTHROPLASTY Right ?   x 2    Social History   Socioeconomic History  . Marital status: Divorced    Spouse name: Not on file  . Number of children: 3  . Years of education: 40  . Highest education level: Not on file  Occupational History  . Not on file  Tobacco Use  . Smoking status: Never Smoker  . Smokeless tobacco: Never Used  Substance and Sexual Activity  . Alcohol use: No  . Drug use: No  . Sexual activity: Never  Other Topics Concern  . Not on file  Social History Narrative   Divorced. 3 children [name], Max and [name]. Lives alone.   High school graduate.   Takes a daily vitamin.   Wears her seatbelt. Wears a hearing aid. Wears dentures.   Requires a cane for assistive device for walking.   Smoke detector in the home.   Feels safe in her relationships.   Social Determinants of Health   Financial Resource Strain:   . Difficulty of Paying Living Expenses:   Food Insecurity:   . Worried About Charity fundraiser in the Last Year:   . Arboriculturist in the Last Year:   Transportation Needs:   . Film/video editor (Medical):   Marland Kitchen Lack of Transportation (Non-Medical):   Physical Activity:   . Days of Exercise per Week:   . Minutes of Exercise per Session:   Stress:   . Feeling of Stress :   Social Connections:   . Frequency of Communication with Friends and Family:   . Frequency of Social Gatherings with  Friends and Family:   . Attends Religious Services:   . Active Member of Clubs or Organizations:   . Attends Archivist Meetings:   Marland Kitchen Marital Status:   Intimate Partner Violence:   . Fear of Current or Ex-Partner:   . Emotionally Abused:   Marland Kitchen Physically Abused:   . Sexually Abused:     Family History  Problem Relation Age of Onset  . Heart disease Brother     ROS: no fevers or chills, productive cough, hemoptysis, dysphasia, odynophagia, melena, hematochezia, dysuria, hematuria, rash, seizure activity, orthopnea, PND, pedal edema, claudication. Remaining systems are negative.  Physical Exam:   Blood pressure (!) 192/87, pulse 74, height 5\' 4"  (1.626 m), weight 135  lb 1.9 oz (61.3 kg).  General:  Well developed/well nourished in NAD Skin warm/dry Patient not depressed No peripheral clubbing Back-normal HEENT-normal/normal eyelids Neck supple/normal carotid upstroke bilaterally; no bruits; no JVD; no thyromegaly chest - CTA/ normal expansion CV - RRR/normal S1 and S2; no murmurs, rubs or gallops;  PMI nondisplaced Abdomen -NT/ND, no HSM, no mass, + bowel sounds, no bruit 2+ femoral pulses, no bruits Ext-no edema, chords, 2+ DP Neuro-grossly nonfocal  ECG -May 14, 2019-sinus rhythm with PACs.  Personally reviewed  A/P  1 question aortic valve mass-this was felt possible on transthoracic echocardiogram.  However transesophageal echocardiogram showed no mass.  No further work-up indicated.  2 recent CVA-continue aspirin and Plavix to complete 3 months of therapy following recent CVA.  Aspirin can then be discontinued and Plavix continued long-term.  This recommendation was by neurology.  3 hypertension-patient's blood pressure is elevated.  However she states her systolic typically runs [id number].  We will continue present regimen and follow.  4 hyperlipidemia-continue statin.  Kirk Ruths, MD

## 2019-07-04 NOTE — Telephone Encounter (Signed)
Please inform patient the following information: -Her ultrasound was not able to visualize her ovaries, therefore we could not reevaluate the possible right ovarian mass that was reported on her CT.  Her ultrasound did show a small amount of fluid in the uterus.  Although reported as only a small amount of fluid there, any  fluid collection in a post menopausal patient needs to be evaluated by gynecology further to rule out gynecological cancers.  I would recommend evaluation by a gynecologist.  - referral to GYN has been placed to further evaluate patient, review the already completed imaging study results and recommend additional work up if felt needed.

## 2019-07-10 ENCOUNTER — Encounter: Payer: Self-pay | Admitting: Adult Health

## 2019-07-10 ENCOUNTER — Other Ambulatory Visit: Payer: Self-pay

## 2019-07-10 ENCOUNTER — Encounter: Payer: Self-pay | Admitting: Cardiology

## 2019-07-10 ENCOUNTER — Ambulatory Visit: Payer: Medicare Other | Admitting: Adult Health

## 2019-07-10 ENCOUNTER — Ambulatory Visit: Payer: Medicare Other | Admitting: Cardiology

## 2019-07-10 VITALS — BP 192/87 | HR 74 | Ht 64.0 in | Wt 135.1 lb

## 2019-07-10 VITALS — BP 142/82 | HR 76 | Temp 98.3°F | Ht 64.0 in | Wt 134.0 lb

## 2019-07-10 DIAGNOSIS — I63512 Cerebral infarction due to unspecified occlusion or stenosis of left middle cerebral artery: Secondary | ICD-10-CM | POA: Diagnosis not present

## 2019-07-10 DIAGNOSIS — I10 Essential (primary) hypertension: Secondary | ICD-10-CM

## 2019-07-10 DIAGNOSIS — E7800 Pure hypercholesterolemia, unspecified: Secondary | ICD-10-CM | POA: Diagnosis not present

## 2019-07-10 DIAGNOSIS — E785 Hyperlipidemia, unspecified: Secondary | ICD-10-CM

## 2019-07-10 DIAGNOSIS — I671 Cerebral aneurysm, nonruptured: Secondary | ICD-10-CM | POA: Diagnosis not present

## 2019-07-10 DIAGNOSIS — E119 Type 2 diabetes mellitus without complications: Secondary | ICD-10-CM

## 2019-07-10 DIAGNOSIS — I358 Other nonrheumatic aortic valve disorders: Secondary | ICD-10-CM

## 2019-07-10 MED ORDER — PRAVASTATIN SODIUM 40 MG PO TABS: 40.0000 mg | ORAL_TABLET | Freq: Every day | ORAL | 3 refills | Status: DC

## 2019-07-10 NOTE — Progress Notes (Signed)
[facility] [address] [place] [address] [phone]       HOSPITAL FOLLOW UP NOTE  [name] Date of Birth:  06-04-1927 Medical Record Number:  [id number]   Reason for Referral:  hospital stroke follow up    SUBJECTIVE:   CHIEF COMPLAINT:  Chief Complaint  Patient presents with  . Follow-up    hospital fu, treatment rm, alone, with son     HPI:   [name] is a 86 y.o. female with history of HTN, HLD, DM2, spinal stenosis, OS glaucoma, hx BRVO who presented on 05/14/2019 with confusion and aphasia after being found down.  Stroke work-up revealed to frontal parietal cortical infarcts due to hypoperfusion in setting of dehydration and AKI with left ICA and MCA severe stenosis. 2D echo reported right AV mobile mass but ruled out with TEE.  Recommended DAPT for 3 months then Plavix alone as previously on aspirin given severe intracranial stenosis.  HTN stable and recommended long-term BP goal 130-150 to ensure adequate perfusion with intracranial stenosis.  LDL 104 and increased pravastatin from 10 mg to 40 mg daily.  Controlled DM with A1c 5.5.  Other stroke risk factors include advanced age but no prior history of stroke.  Other active problems include resolved hyponatremia and right ICA 5 mm aneurysm and recommended follow-up outpatient for monitoring.  Evaluated by therapies and recommend discharge home with home health therapies.   Stroke:   2 frontoparietal cortical infarcts due to hypoperfusion in setting of dehydration and AKI wiht left ICA and MCA severe stenosis, although cardioembolic source can not be completely ruled out  Code Stroke CT head No acute abnormality. Moderate atrophy and small vessel disease. ASPECTS 10.     MRI  2 punctate L frontoparietal cortical infarcts. Moderate advanced Small vessel disease and Atrophy. L paranasal sinus dz.   MRA  Severe L ICA terminus to L M1 stenosis. L V4 occlusion beyond takeoff of L  PICA. R V4 60% stenosis. Extensive atherosclerosis B ICA siphons w/ moderate to advanced focal stenoses. R ICA 7mm aneurysm.   Carotid Doppler  B ICA 1-39% stenosis, VAs antegrade   2D Echo EF 60-65%. Right AV mobile mass  TEE normal, no masses negative bubble  EEG left frontal temporal intermittent slowing, no seizure  LDL 104 -increase pravastatin 10 mg to 40 mg daily  HgbA1c 5.5 -controlled DM  aspirin 81 mg daily prior to admission, now on aspirin 325 mg daily and plavix DAPT. Continue DAPT x 3 months the plavix alone given severe intracranial stenosis.  Therapy recommendations:  [facility] PT, [place] OT, [place] SLP   Disposition:   Home  Today, 07/10/2019, Madison Martinez is being seen for hospital follow-up accompanied by her son. She has been doing well since discharge without residual deficits or symptoms. She continues to live with her son but able to maintain ADLs independently.  Ambulates with RW in the house and denies any recent falls. She has continued on DAPT without bleeding or bruising and plans on continuing for 1 month and then plavix alone as 3 months DAPT completed at that time. Continues on pravastatin 10mg  daily without myalgias -recommended increasing dosage per Dr. Erlinda Hong but for unknown reasons dosage not increase at discharge. Blood pressure today 142/82.  No concerns at this time.    ROS:   14 system review of systems performed and negative with exception of gait impairment, hard of hearing and short-term memory loss  PMH:  Past Medical History:  Diagnosis Date  . Blind hypertensive eye, left 04/18/2012  . Changing skin lesion 11/23/2018  . Gait disturbance 10/05/2015   pt declined neuro referral per records  . Generalized weakness 05/15/2019  . Hard of hearing 12/02/2015   hearing aids  . Hyperlipidemia   . Hypertension   . Insomnia    "tried melatonin, advil PM, xanax and klonopin without success"  . Leukocytosis 11/07/2013   seen oncology, did not want to continue to follow  there, so continued follow up at PCP.  . Osteoporosis   . Polyneuropathy   . Primary open angle glaucoma 01/2011  . Spinal stenosis   . Stable branch retinal vein occlusion 01/06/2012  . Type 2 diabetes mellitus ([place])    "diet controlled"    PSH:  Past Surgical History:  Procedure Laterality Date  . BUBBLE STUDY  05/17/2019   Procedure: BUBBLE STUDY;  Surgeon: Lelon Perla, MD;  Location: [facility] ENDOSCOPY;  Service: Cardiovascular;;  . EYE SURGERY    . [place]  2005  . TEE WITHOUT CARDIOVERSION N/A 05/17/2019   Procedure: TRANSESOPHAGEAL ECHOCARDIOGRAM (TEE);  Surgeon: Lelon Perla, MD;  Location: [facility] ENDOSCOPY;  Service: Cardiovascular;  Laterality: N/A;  . TOTAL HIP ARTHROPLASTY Left 1991  . TOTAL HIP ARTHROPLASTY Right ?   x 2    Social History:  Social History   Socioeconomic History  . Marital status: Divorced    Spouse name: Not on file  . Number of children: 3  . Years of education: 10  . Highest education level: Not on file  Occupational History  . Not on file  Tobacco Use  . Smoking status: Never Smoker  . Smokeless tobacco: Never Used  Substance and Sexual Activity  . Alcohol use: No  . Drug use: No  . Sexual activity: Never  Other Topics Concern  . Not on file  Social History Narrative   Divorced. 3 children [name], Max and [name]. Lives alone.   High school graduate.   Takes a daily vitamin.   Wears her seatbelt. Wears a hearing aid. Wears dentures.   Requires a cane for assistive device for walking.   Smoke detector in the home.   Feels safe in her relationships.   Social Determinants of Health   Financial Resource Strain:   . Difficulty of Paying Living Expenses:   Food Insecurity:   . Worried About Charity fundraiser in the Last Year:   . Arboriculturist in the Last Year:   Transportation Needs:   . Film/video editor (Medical):   Marland Kitchen Lack of Transportation (Non-Medical):   Physical Activity:   . Days of Exercise per Week:     . Minutes of Exercise per Session:   Stress:   . Feeling of Stress :   Social Connections:   . Frequency of Communication with Friends and Family:   . Frequency of Social Gatherings with Friends and Family:   . Attends Religious Services:   . Active Member of Clubs or Organizations:   . Attends Archivist Meetings:   Marland Kitchen Marital Status:   Intimate Partner Violence:   . Fear of Current or Ex-Partner:   . Emotionally Abused:   Marland Kitchen Physically Abused:   . Sexually Abused:     Family History:  Family History  Problem Relation Age of Onset  . Heart disease Brother     Medications:   Current Outpatient Medications on File Prior to Visit  Medication Sig Dispense Refill  . aspirin 325 MG EC tablet Take 1 tablet (325 mg total) by mouth daily. Take only for 3 months. 60 tablet 0  . Ca Carb-Glucos-Chond-Phelloden (FLEXI JOINT RELIEF PLUS PO) Take 2 capsules by mouth daily.    . Cholecalciferol (VITAMIN D-3) 25 MCG (1000 UT) CAPS Take 1,000 Units by mouth daily.    . clopidogrel (PLAVIX) 75 MG tablet Take 1 tablet (75 mg total) by mouth daily. 90 tablet 3  . dorzolamide-timolol (COSOPT) 22.3-6.8 MG/ML ophthalmic solution Place 1 drop into the left eye 2 (two) times daily.     Marland Kitchen glipiZIDE (GLUCOTROL XL) 2.5 MG 24 hr tablet TAKE 1 TABLET BY MOUTH  DAILY WITH BREAKFAST (Patient taking differently: Take 2.5 mg by mouth daily with breakfast. ) 90 tablet 3  . hydrALAZINE (APRESOLINE) 25 MG tablet TAKE 1 TABLET BY MOUTH  EVERY 8 HOURS. (Patient taking differently: Take 25 mg by mouth every 8 (eight) hours. Marland Kitchen) 270 tablet 3  . KRILL OIL PO Take 1 capsule by mouth daily.     Marland Kitchen lisinopril (ZESTRIL) 40 MG tablet Take 1 tablet (40 mg total) by mouth daily. 90 tablet 1  . Melatonin 5 MG TABS Take 5 mg by mouth at bedtime as needed (for sleep).    . metoprolol succinate (TOPROL-XL) 25 MG 24 hr tablet Take 1 tablet (25 mg total) by mouth daily. 90 tablet 1  . Multiple Vitamins-Minerals (ONE-A-[place]  WOMENS 50 PLUS) TABS Take 1 tablet by mouth daily.    . NON FORMULARY Take 2 capsules by mouth See admin instructions. UriVArx - Clinically Proven Bladder Control Capsules: Take 2 capsules by mouth once a Mederos    . polyethylene glycol powder (GLYCOLAX/MIRALAX) 17 GM/SCOOP powder Take 17 g by mouth daily as needed for mild constipation.    Marland Kitchen VYZULTA 0.024 % SOLN Place 1 drop into the left eye at bedtime.      No current facility-administered medications on file prior to visit.    Allergies:   Allergies  Allergen Reactions  . Amlodipine Besylate Other (See Comments)    Vertigo  . Flu Virus Vaccine Swelling    Site of swelling not known by son      OBJECTIVE:  Vitals  Vitals:   07/10/19 1106  BP: (!) 142/82  Pulse: 76  Temp: 98.3 F (36.8 C)  Weight: 134 lb (60.8 kg)  Height: 5\' 4"  (1.626 m)   Body mass index is 23 kg/m. No exam data present  Post stroke PHQ 2/9  Depression screen PHQ 2/9 07/10/2019  Decreased Interest 0  Down, Depressed, Hopeless 0  PHQ - 2 Score 0     Physical exam  General: well developed, well nourished, very pleasant elderly Caucasian female, seated, in no evident distress Head: head normocephalic and atraumatic.   Neck: supple with no carotid or supraclavicular bruits Cardiovascular: regular rate and rhythm, no murmurs Musculoskeletal: no deformity Skin:  no rash/petichiae Vascular:  Normal pulses all extremities   Neurologic Exam Mental Status: Awake and fully alert. Normal speech and language. Oriented to place and time. Recent and remote memory intact. Attention span, concentration and fund of knowledge appropriate. Mood and affect appropriate.  Cranial Nerves: Fundoscopic exam reveals sharp disc in right eye as patient legally blind left eye.  Pupils equal, briskly reactive to light. Extraocular movements full without nystagmus. Visual fields full to confrontation.  HOH bilaterally. Facial sensation intact. Face, tongue, palate moves  normally and symmetrically.  Motor: Normal  bulk and tone. Normal strength in all tested extremity muscles. Sensory.: intact to touch , pinprick , position and vibratory sensation.  Coordination: Rapid alternating movements normal in all extremities. Finger-to-nose and heel-to-shin performed accurately bilaterally. Gait and Station: Deferred Reflexes: 1+ and symmetric. Toes downgoing.     NIHSS  0 Modified Rankin  0      ASSESSMENT: Madison Martinez is a 86 y.o. year old female presented with confusion and aphasia after being found down on 05/14/2019 with stroke work-up revealing to frontoparietal cortical infarcts due to hypoperfusion in setting of dehydration and AKI with left ICA and MCA severe stenosis. Vascular risk factors include HTN, HLD, DM, intracranial stenosis and right ICA 5 mm aneurysm.  Recovered well from a stroke standpoint without residual deficits     PLAN:  1. Left frontal parietal stroke: Continue aspirin 325 mg daily and clopidogrel 75 mg daily  and increase pravastatin to 40 mg daily for secondary stroke prevention.  Continue DAPT for total 39-month duration and then Plavix alone - approx 1 additional month.  Maintain strict control of hypertension with blood pressure goal below 130/90, diabetes with hemoglobin A1c goal below 6.5% and cholesterol with LDL cholesterol (bad cholesterol) goal below 70 mg/dL.  I also advised the patient to eat a healthy diet with plenty of whole grains, cereals, fruits and vegetables, exercise regularly with at least 30 minutes of continuous activity daily and maintain ideal body weight. 2. HTN: Stable.  Continue to follow with PCP for monitoring 3. HLD: Increase pravastatin dosage to 40 mg daily as recommended at hospitalization.  Repeat lipid panel at follow-up visit and ongoing follow-up with PCP in the future for prescribing, managing and management 4. DMII: Stable.  Continue to follow with PCP for monitoring management 5. Intracranial  stenosis: Complete 3 months DAPT and continue Plavix alone as well as pravastatin 40 mg daily.  Discussion regarding importance of managing stroke risk factors.  Currently asymptomatic therefore no indication for surveillance monitoring as current treatment plan would not change 6. Right ICA aneurysm: Repeat CTA after follow-up visit for surveillance monitoring    Follow up in 3 months or call earlier if needed   I spent 45 minutes of face-to-face and non-face-to-face time with patient and son.  This included previsit chart review, lab review, study review, order entry, electronic health record documentation, patient education regarding recent stroke, residual deficits, importance of managing stroke risk factors and answered all questions to patient satisfaction     Frann Rider, [facility]  [facility] [address] [place], [place] [address]  Phone [phone] Fax [phone] Note: This document was prepared with digital dictation and possible smart phrase technology. Any transcriptional errors that result from this process are unintentional.

## 2019-07-10 NOTE — Patient Instructions (Addendum)
Continue aspirin 325 mg daily and clopidogrel 75 mg daily  and increase pravastatin to 40mg  daily  for secondary stroke prevention  Discontinue aspirin in 1 month and continue on plavix (clopidogrel) alone at that time  We will repeat your cholesterol levels at follow up visit  Continue to follow up with PCP regarding cholesterol, blood pressure and diabetes management   Continue to monitor blood pressure at home  Maintain strict control of hypertension with blood pressure goal below 130/90, diabetes with hemoglobin A1c goal below 6.5% and cholesterol with LDL cholesterol (bad cholesterol) goal below 70 mg/dL. I also advised the patient to eat a healthy diet with plenty of whole grains, cereals, fruits and vegetables, exercise regularly and maintain ideal body weight.  Followup in the future with me in 3 months or call earlier if needed       Thank you for coming to see [place] at [facility]. I hope we have been able to provide you high quality care today.  You may receive a patient satisfaction survey over the next few weeks. We would appreciate your feedback and comments so that we may continue to improve ourselves and the health of our patients.

## 2019-07-10 NOTE — Progress Notes (Signed)
I agree with the above plan 

## 2019-07-10 NOTE — Patient Instructions (Signed)
Medication Instructions:  NO CHANGE *If you need a refill on your cardiac medications before your next appointment, please call your pharmacy*   Lab Work: If you have labs (blood work) drawn today and your tests are completely normal, you will receive your results only by: . MyChart Message (if you have MyChart) OR . A paper copy in the mail If you have any lab test that is abnormal or we need to change your treatment, we will call you to review the results.   Follow-Up: At [facility], you and your health needs are our priority.  As part of our continuing mission to provide you with exceptional heart care, we have created designated Provider Care Teams.  These Care Teams include your primary Cardiologist (physician) and Advanced Practice Providers (APPs -  Physician Assistants and Nurse Practitioners) who all work together to provide you with the care you need, when you need it.  We recommend signing up for the patient portal called "MyChart".  Sign up information is provided on this After Visit Summary.  MyChart is used to connect with patients for Virtual Visits (Telemedicine).  Patients are able to view lab/test results, encounter notes, upcoming appointments, etc.  Non-urgent messages can be sent to your provider as well.   To learn more about what you can do with MyChart, go to [email or web address].    Your next appointment:    AS NEEDED 

## 2019-07-16 ENCOUNTER — Other Ambulatory Visit: Payer: Self-pay

## 2019-07-16 ENCOUNTER — Ambulatory Visit: Payer: Medicare Other | Admitting: Obstetrics and Gynecology

## 2019-07-16 ENCOUNTER — Telehealth: Payer: Self-pay | Admitting: Obstetrics and Gynecology

## 2019-07-16 ENCOUNTER — Encounter: Payer: Self-pay | Admitting: Obstetrics and Gynecology

## 2019-07-16 ENCOUNTER — Other Ambulatory Visit (HOSPITAL_COMMUNITY)
Admission: RE | Admit: 2019-07-16 | Discharge: 2019-07-16 | Disposition: A | Discharge status: Home / Self Care | Payer: Medicare Other | Source: Ambulatory Visit | Attending: Obstetrics and Gynecology | Admitting: Obstetrics and Gynecology

## 2019-07-16 VITALS — BP 166/80 | HR 80 | Temp 97.4°F | Resp 22 | Ht 62.0 in | Wt 130.0 lb

## 2019-07-16 DIAGNOSIS — Z124 Encounter for screening for malignant neoplasm of cervix: Secondary | ICD-10-CM | POA: Diagnosis not present

## 2019-07-16 DIAGNOSIS — N859 Noninflammatory disorder of uterus, unspecified: Secondary | ICD-10-CM | POA: Diagnosis not present

## 2019-07-16 DIAGNOSIS — R9389 Abnormal findings on diagnostic imaging of other specified body structures: Secondary | ICD-10-CM | POA: Diagnosis not present

## 2019-07-16 NOTE — Telephone Encounter (Signed)
Please contact [facility] about patient's Plavix.   She has fluid in the endometrial canal and thickened endometrium and needs an endometrial biopsy for diagnostic purposes.  This procedure is expected to cause mild bleeding and will require a paracervical block with lidocaine.   When should the Plavix be stopped and then restarted?

## 2019-07-16 NOTE — Progress Notes (Signed)
GYNECOLOGY  VISIT   HPI: 86 y.o.   Divorced  Caucasian  female   G3P3 with Patient's last menstrual period was 03/21/1978 (approximate).   here for evaluation of abnormal CT Scan.   Son present for the visit today.   Patient had a CT scan done 05/14/19 due to nausea and vomiting.  Dx with colitis.  Possible right ovarian mass 2.5 cm noted.   Pelvic US done 07/02/19 in follow up to this.  CLINICAL DATA:  Abnormal CT scan, possible RIGHT ovarian mass  EXAM: TRANSABDOMINAL AND TRANSVAGINAL ULTRASOUND OF PELVIS  TECHNIQUE: Both transabdominal and transvaginal ultrasound examinations of the pelvis were performed. Transabdominal technique was performed for global imaging of the pelvis including uterus, ovaries, adnexal regions, and pelvic cul-de-sac. It was necessary to proceed with endovaginal exam following the transabdominal exam to visualize the uterus, endometrium, and ovaries.  COMPARISON:  None  Correlation: CT abdomen pelvis 05/14/2019  FINDINGS: Uterus  Measurements: 6.0 x 3.2 x 3.7 cm = volume: 37 mL. Retroverted. Heterogeneous myometrium. No focal mass.  Endometrium  Thickness: 5 mm. Small amount of nonspecific endometrial and endocervical fluid. No definite mass  Right ovary  Not visualized, likely obscured by bowel  Left ovary  Not visualized, likely obscured by bowel  Other findings  No free pelvic fluid or adnexal masses.  IMPRESSION: Nonspecific small amount of endometrial and endocervical fluid.  Nonvisualization of ovaries likely obscured by bowel; unable to characterize the abnormality identified by CT.  This lesion could be evaluated by MRI with and without contrast if clinically indicated.   Electronically Signed   By: Lavonia Dana M.D.   On: 07/02/2019 14:46   Denies pain or bleeding.   GYNECOLOGIC HISTORY: Patient's last menstrual period was 03/21/1978 (approximate). Contraception:  PMP Menopausal hormone  therapy:  none Last mammogram: Years ago--normal  Last pap smear: years ago--always normal        OB History    Gravida  3   Para  3   Term      Preterm      AB      Living  3     SAB      TAB      Ectopic      Multiple      Live Births                 Patient Active Problem List   Diagnosis Date Noted  . Fluid in endometrial cavity 07/04/2019  . Aortic atherosclerosis ([place]) 05/23/2019  . Calculus of gallbladder without cholecystitis without obstruction 05/23/2019  . Sigmoid diverticulitis 05/23/2019  . Ovarian mass, right 05/23/2019  . Chronic anticoagulation 05/23/2019  . Aneurysm of internal carotid artery 05/23/2019  . Abnormal EEG 05/23/2019  . Small vessel disease ([place]) 05/23/2019  . Advanced cerebral atrophy ([place]) 05/23/2019  . Aortic valve calcification 05/23/2019  . CVA (cerebral vascular accident) ([place]) 05/16/2019  . Aortic valve mass 05/16/2019  . Colitis 05/15/2019  . Skin cancer 12/14/2018  . Mild cognitive impairment 08/06/2018  . Right lumbar radiculopathy 09/25/2017  . At high risk for falls 09/25/2017  . Use of cane as ambulatory aid 12/16/2016  . Chronic kidney disease, stage 3 ([place]) 12/16/2016  . Hyperlipidemia   . Osteoporosis   . Polyneuropathy   . Spinal stenosis   . Hard of hearing 12/02/2015  . Monoclonal B-cell lymphocytosis 11/07/2013  . Blind hypertensive eye 04/18/2012  . Diabetes mellitus, type II ([place]) 03/06/2012  . Essential hypertension  03/06/2012  . Primary open angle glaucoma 01/06/2012  . Pseudophakia 01/06/2012  . Stable branch retinal vein occlusion 01/06/2012    Past Medical History:  Diagnosis Date  . Blind hypertensive eye, left 04/18/2012  . Changing skin lesion 11/23/2018  . CVA (cerebral vascular accident) ([place])   . Gait disturbance 10/05/2015   pt declined neuro referral per records  . Generalized weakness 05/15/2019  . Hard of hearing 12/02/2015   hearing aids  . Hyperlipidemia   . Hypertension    . Insomnia    "tried melatonin, advil PM, xanax and klonopin without success"  . Leukocytosis 11/07/2013   seen oncology, did not want to continue to follow there, so continued follow up at PCP.  . Osteoporosis   . Polyneuropathy   . Primary open angle glaucoma 01/2011  . Spinal stenosis   . Stable branch retinal vein occlusion 01/06/2012  . Type 2 diabetes mellitus ([place])    "diet controlled"    Past Surgical History:  Procedure Laterality Date  . BUBBLE STUDY  05/17/2019   Procedure: BUBBLE STUDY;  Surgeon: Lelon Perla, MD;  Location: [facility] ENDOSCOPY;  Service: Cardiovascular;;  . EYE SURGERY    . [place]  2005  . TEE WITHOUT CARDIOVERSION N/A 05/17/2019   Procedure: TRANSESOPHAGEAL ECHOCARDIOGRAM (TEE);  Surgeon: Lelon Perla, MD;  Location: [facility] ENDOSCOPY;  Service: Cardiovascular;  Laterality: N/A;  . TOTAL HIP ARTHROPLASTY Left 1991  . TOTAL HIP ARTHROPLASTY Right ?   x 2    Current Outpatient Medications  Medication Sig Dispense Refill  . aspirin 325 MG EC tablet Take 1 tablet (325 mg total) by mouth daily. Take only for 3 months. 60 tablet 0  . Ca Carb-Glucos-Chond-Phelloden (FLEXI JOINT RELIEF PLUS PO) Take 2 capsules by mouth daily.    . Cholecalciferol (VITAMIN D-3) 25 MCG (1000 UT) CAPS Take 1,000 Units by mouth daily.    . clopidogrel (PLAVIX) 75 MG tablet Take 1 tablet (75 mg total) by mouth daily. 90 tablet 3  . dorzolamide-timolol (COSOPT) 22.3-6.8 MG/ML ophthalmic solution Place 1 drop into the left eye 2 (two) times daily.     Marland Kitchen glipiZIDE (GLUCOTROL XL) 2.5 MG 24 hr tablet TAKE 1 TABLET BY MOUTH  DAILY WITH BREAKFAST (Patient taking differently: Take 2.5 mg by mouth daily with breakfast. ) 90 tablet 3  . hydrALAZINE (APRESOLINE) 25 MG tablet TAKE 1 TABLET BY MOUTH  EVERY 8 HOURS. (Patient taking differently: Take 25 mg by mouth every 8 (eight) hours. Marland Kitchen) 270 tablet 3  . KRILL OIL PO Take 1 capsule by mouth daily.     Marland Kitchen lisinopril (ZESTRIL) 40 MG tablet  Take 1 tablet (40 mg total) by mouth daily. 90 tablet 1  . Melatonin 5 MG TABS Take 5 mg by mouth at bedtime as needed (for sleep).    . metoprolol succinate (TOPROL-XL) 25 MG 24 hr tablet Take 1 tablet (25 mg total) by mouth daily. 90 tablet 1  . Multiple Vitamins-Minerals (ONE-A-Chillemi WOMENS 50 PLUS) TABS Take 1 tablet by mouth daily.    . NON FORMULARY Take 2 capsules by mouth See admin instructions. UriVArx - Clinically Proven Bladder Control Capsules: Take 2 capsules by mouth once a [place]    . polyethylene glycol powder (GLYCOLAX/MIRALAX) 17 GM/SCOOP powder Take 17 g by mouth daily as needed for mild constipation.    . pravastatin (PRAVACHOL) 40 MG tablet Take 1 tablet (40 mg total) by mouth daily. 90 tablet 3  . VYZULTA 0.024 %  SOLN Place 1 drop into the left eye at bedtime.      No current facility-administered medications for this visit.     ALLERGIES: Amlodipine besylate and Flu virus vaccine  Family History  Problem Relation Age of Onset  . Heart disease Brother     Social History   Socioeconomic History  . Marital status: Divorced    Spouse name: Not on file  . Number of children: 3  . Years of education: 69  . Highest education level: Not on file  Occupational History  . Not on file  Tobacco Use  . Smoking status: Never Smoker  . Smokeless tobacco: Never Used  Substance and Sexual Activity  . Alcohol use: No  . Drug use: No  . Sexual activity: Never  Other Topics Concern  . Not on file  Social History Narrative   Divorced. 3 children [name], Max and [name]. Lives alone.   High school graduate.   Takes a daily vitamin.   Wears her seatbelt. Wears a hearing aid. Wears dentures.   Requires a cane for assistive device for walking.   Smoke detector in the home.   Feels safe in her relationships.   Social Determinants of Health   Financial Resource Strain:   . Difficulty of Paying Living Expenses:   Food Insecurity:   . Worried About Charity fundraiser in the  Last Year:   . Arboriculturist in the Last Year:   Transportation Needs:   . Film/video editor (Medical):   Marland Kitchen Lack of Transportation (Non-Medical):   Physical Activity:   . Days of Exercise per Week:   . Minutes of Exercise per Session:   Stress:   . Feeling of Stress :   Social Connections:   . Frequency of Communication with Friends and Family:   . Frequency of Social Gatherings with Friends and Family:   . Attends Religious Services:   . Active Member of Clubs or Organizations:   . Attends Archivist Meetings:   Marland Kitchen Marital Status:   Intimate Partner Violence:   . Fear of Current or Ex-Partner:   . Emotionally Abused:   Marland Kitchen Physically Abused:   . Sexually Abused:     Review of Systems  All other systems reviewed and are negative.   PHYSICAL EXAMINATION:    BP (!) 166/80 (Cuff Size: Normal)   Pulse 80   Temp (!) 97.4 F (36.3 C) (Temporal)   Resp (!) 22   Ht 5\' 2"  (1.575 m)   Wt 130 lb (59 kg)   LMP 03/21/1978 (Approximate)   BMI 23.78 kg/m     General appearance: alert, cooperative and appears stated age Head: Normocephalic, without obvious abnormality, atraumatic Lungs: clear to auscultation bilaterally Heart: regular rate and rhythm Abdomen: soft, non-tender, no masses,  no organomegaly Extremities: extremities normal, atraumatic, no cyanosis or edema No abnormal inguinal nodes palpated Neurologic: Grossly normal  Pelvic: External genitalia:  no lesions              Urethra:  normal appearing urethra with no masses, tenderness or lesions              Bartholins and Skenes: normal                 Vagina: normal appearing vagina with normal color and discharge, no lesions              Cervix: no lesions.  Stenotic.  Bimanual Exam:  Uterus:  normal size, contour, position, consistency, mobility, non-tender              Adnexa: no mass, fullness, tenderness              Rectal exam: Yes.  .  Confirms.              Anus:  normal  sphincter tone, no lesions  Chaperone was present for exam.  ASSESSMENT  Fluid in endometrial canal.  Thickened endometrium.  On Plavix for recent stroke.  PLAN  We reviewed reasons for thickened endometrium and fluid in the endometrial canal, which may be normal but could indicate cancerous cells.  We discussed endometrial biopsy in the office setting versus outpatient dilation and curettage. I would recommend trying office sampling.  Will check with Neurology about stopping Plavix prior to procedure and then restarting.  Pap taken.    An After Visit Summary was printed and given to the patient.  __45____ minutes face to face time of which over 50% was spent in counseling.

## 2019-07-16 NOTE — Patient Instructions (Signed)
Endometrial Biopsy  Endometrial biopsy is a procedure in which a tissue sample is taken from inside the uterus. The sample is taken from the endometrium, which is the lining of the uterus. The tissue sample is then checked under a microscope to see if the tissue is normal or abnormal. This procedure helps to determine where you are in your menstrual cycle and how hormone levels are affecting the lining of the uterus. This procedure may also be used to evaluate uterine bleeding or to diagnose endometrial cancer, endometrial tuberculosis, polyps, or other inflammatory conditions. Tell a health care provider about:  Any allergies you have.  All medicines you are taking, including vitamins, herbs, eye drops, creams, and over-the-counter medicines.  Any problems you or family members have had with anesthetic medicines.  Any blood disorders you have.  Any surgeries you have had.  Any medical conditions you have.  Whether you are pregnant or may be pregnant. What are the risks? Generally, this is a safe procedure. However, problems may occur, including:  Bleeding.  Pelvic infection.  Puncture of the wall of the uterus with the biopsy device (rare). What happens before the procedure?  Keep a record of your menstrual cycles as told by your health care provider. You may need to schedule your procedure for a specific time in your cycle.  You may want to bring a sanitary pad to wear after the procedure.  Ask your health care provider about: ? Changing or stopping your regular medicines. This is especially important if you are taking diabetes medicines or blood thinners. ? Taking medicines such as aspirin and ibuprofen. These medicines can thin your blood. Do not take these medicines before your procedure if your health care provider instructs you not to.  Plan to have someone take you home from the hospital or clinic. What happens during the procedure?  To lower your risk of  infection: ? Your health care team will wash or sanitize their hands.  You will lie on an exam table with your feet and legs supported as in a pelvic exam.  Your health care provider will insert an instrument (speculum) into your vagina to see your cervix.  Your cervix will be cleansed with an antiseptic solution.  A medicine (local anesthetic) will be used to numb the cervix.  A forceps instrument (tenaculum) will be used to hold your cervix steady for the biopsy.  A thin, rod-like instrument (uterine sound) will be inserted through your cervix to determine the length of your uterus and the location where the biopsy sample will be removed.  A thin, flexible tube (catheter) will be inserted through your cervix and into the uterus. The catheter will be used to collect the biopsy sample from your endometrial tissue.  The catheter and speculum will then be removed, and the tissue sample will be sent to a lab for examination. What happens after the procedure?  You will rest in a recovery area until you are ready to go home.  You may have mild cramping and a small amount of vaginal bleeding. This is normal.  It is up to you to get the results of your procedure. Ask your health care provider, or the department that is doing the procedure, when your results will be ready. Summary  Endometrial biopsy is a procedure in which a tissue sample is taken from the endometrium, which is the lining of the uterus.  This procedure may help to diagnose menstrual cycle problems, abnormal bleeding, or other conditions affecting   the endometrium.  Before the procedure, keep a record of your menstrual cycles as told by your health care provider.  The tissue sample that is removed will be checked under a microscope to see if it is normal or abnormal. This information is not intended to replace advice given to you by your health care provider. Make sure you discuss any questions you have with your health care  provider. Document Revised: 02/17/2017 Document Reviewed: 03/23/2016 Elsevier Patient Education  2020 Elsevier Inc.  

## 2019-07-17 ENCOUNTER — Telehealth: Payer: Self-pay | Admitting: Adult Health

## 2019-07-17 NOTE — Telephone Encounter (Signed)
Spoke with Jael at [facility]. Advised as seen below per Dr. Quincy Simmonds. Jael will forward message to nurse to review with Frann Rider, NP and return call with recommendations.

## 2019-07-17 NOTE — Telephone Encounter (Signed)
Lovey Newcomer, RN from [employer]. Called and Bock, FNP had responded through Epic as seen below about pt's Plavix.   Frann Rider, NP to Brandon Melnick, RN     07/17/19 12:31 PM Note   Due to indication of procedure and emergently, may stop Plavix 3 to 5 days prior to procedure and restart immediately after once hemodynamically stable with small but acceptable preprocedure risk of recurrent stroke while off therapy.  Also ensure avoidance of hypotension to ensure adequate perfusion with known intra and extracranial stenosis.      Routing to Dr Quincy Simmonds for update.   * need to call and schedule EMB?

## 2019-07-17 NOTE — Telephone Encounter (Signed)
I called and spoke to Joseph.  She was able to see note from Janett Billow, NP re: plavix.  Will forward to physician and will relay to pt as well.

## 2019-07-17 NOTE — Telephone Encounter (Signed)
Sharee Pimple triage nurse from [facility] called stating that the pt has fluid in the endometrial canal and thickened endometrium and needs an endometrial biopsy for diagnostic purposes.  This procedure is expected to cause mild bleeding and will require a paracervical block with lidocaine.  When should the Plavix be stopped and then restarted? Please advise.

## 2019-07-17 NOTE — Telephone Encounter (Signed)
Due to indication of procedure and emergently, may stop Plavix 3 to 5 days prior to procedure and restart immediately after once hemodynamically stable with small but acceptable preprocedure risk of recurrent stroke while off therapy.  Also ensure avoidance of hypotension to ensure adequate perfusion with known intra and extracranial stenosis.

## 2019-07-18 LAB — CYTOLOGY - PAP: Diagnosis: NEGATIVE

## 2019-07-18 NOTE — Telephone Encounter (Signed)
Ok to update patient and her son, I believe on her DPI, regarding stopping the Plavix 3 days prior to endometrial biopsy in the office.  This will reduce bleeding with her procedure.  There is small risk of recurrent stroke with stopping this medication.   The purpose of the biopsy is to determine if she has abnormal cells inside the uterine cavity, due to the endometrial fluid and thickening noted.

## 2019-07-19 NOTE — Telephone Encounter (Signed)
Spoke with Max, pt's son per DPR. Son given recommendations for EMB appt and stopping Plavix per Dr Quincy Simmonds and [facility]. Son agreeable and verbalized understanding.  Pt scheduled for EMB on 07/30/19 at 1130 am with Dr Quincy Simmonds. Pt to stop Plavix on Sat 07/27/19. Son verbalized understanding. Will call back to office with any additional questions.   Routing to Dr Quincy Simmonds for review.  Encounter closed.

## 2019-07-24 ENCOUNTER — Telehealth: Payer: Self-pay | Admitting: Obstetrics and Gynecology

## 2019-07-24 NOTE — Telephone Encounter (Signed)
Patients son Max returned my call and I conveyed the benefits. Ok per DPR-Patient understands/agreeable with the benefits. Patient is aware of the cancellation policy. Appointment scheduled 07/30/19.

## 2019-07-24 NOTE — Telephone Encounter (Signed)
Call placed to convey benefits for endometrial biopsy. 

## 2019-07-30 ENCOUNTER — Other Ambulatory Visit: Payer: Self-pay

## 2019-07-30 ENCOUNTER — Ambulatory Visit: Payer: Medicare Other | Admitting: Obstetrics and Gynecology

## 2019-07-30 ENCOUNTER — Encounter: Payer: Self-pay | Admitting: Obstetrics and Gynecology

## 2019-07-30 ENCOUNTER — Telehealth: Payer: Self-pay | Admitting: Obstetrics and Gynecology

## 2019-07-30 VITALS — BP 144/78 | HR 76 | Temp 97.7°F | Ht 62.0 in | Wt 130.0 lb

## 2019-07-30 DIAGNOSIS — R9389 Abnormal findings on diagnostic imaging of other specified body structures: Secondary | ICD-10-CM

## 2019-07-30 DIAGNOSIS — N882 Stricture and stenosis of cervix uteri: Secondary | ICD-10-CM

## 2019-07-30 DIAGNOSIS — N859 Noninflammatory disorder of uterus, unspecified: Secondary | ICD-10-CM

## 2019-07-30 NOTE — Telephone Encounter (Signed)
Order placed to [facility], Dr. Denman George.   Message to [facility] for scheduling.

## 2019-07-30 NOTE — Telephone Encounter (Signed)
Please make a referral to [facility] for patient who has thickened endometrium, fluid in the endometrial canal, and cervical stenosis.   I was not able to complete an in office endometrial biopsy due to cervical stenosis and inability to find the cervical os.   (She takes Plavix daily due to a stroke, and stopped the medication for the procedure today.)

## 2019-07-30 NOTE — Progress Notes (Signed)
GYNECOLOGY  VISIT   HPI: 86 y.o.   Divorced  Caucasian  female   G3P3 with Patient's last menstrual period was 03/21/1978 (approximate).   here for endometrial biopsy. Stopped Plavix 07-27-19.    Patient's son is here for discussion at the end of the visit.   Patient had pelvic ultrasound on 07/02/19 which showed fluid in the endometrial canal and EMS measuring 5 mm.   Her ovaries were not visualized.  There was no free fluid  Her pelvic ultrasound was prompted by a CT scan of the abdomen and pelvis done on 05/14/19 for nausea and vomiting.  She was dx with colitis of the descending colon, sigmoid diverticulosis, cholelithiasis, a normal appendix, a 2.5 cm high attenuating structure of the right ovary, and a normal uterus.   Having some urinary incontinence at night.  No problems at night.  She takes her blood pressure medication at night.  GYNECOLOGIC HISTORY: Patient's last menstrual period was 03/21/1978 (approximate). Contraception: PMP Menopausal hormone therapy:  none Last mammogram: years ago--normal  Per patient Last pap smear: 07-16-19 Neg         OB History    Gravida  3   Para  3   Term      Preterm      AB      Living  3     SAB      TAB      Ectopic      Multiple      Live Births                 Patient Active Problem List   Diagnosis Date Noted  . Fluid in endometrial cavity 07/04/2019  . Aortic atherosclerosis ([place]) 05/23/2019  . Calculus of gallbladder without cholecystitis without obstruction 05/23/2019  . Sigmoid diverticulitis 05/23/2019  . Ovarian mass, right 05/23/2019  . Chronic anticoagulation 05/23/2019  . Aneurysm of internal carotid artery 05/23/2019  . Abnormal EEG 05/23/2019  . Small vessel disease ([place]) 05/23/2019  . Advanced cerebral atrophy ([place]) 05/23/2019  . Aortic valve calcification 05/23/2019  . CVA (cerebral vascular accident) ([place]) 05/16/2019  . Aortic valve mass 05/16/2019  . Colitis 05/15/2019  . Skin cancer  12/14/2018  . Mild cognitive impairment 08/06/2018  . Right lumbar radiculopathy 09/25/2017  . At high risk for falls 09/25/2017  . Use of cane as ambulatory aid 12/16/2016  . Chronic kidney disease, stage 3 ([place]) 12/16/2016  . Hyperlipidemia   . Osteoporosis   . Polyneuropathy   . Spinal stenosis   . Hard of hearing 12/02/2015  . Monoclonal B-cell lymphocytosis 11/07/2013  . Blind hypertensive eye 04/18/2012  . Diabetes mellitus, type II ([place]) 03/06/2012  . Essential hypertension 03/06/2012  . Primary open angle glaucoma 01/06/2012  . Pseudophakia 01/06/2012  . Stable branch retinal vein occlusion 01/06/2012    Past Medical History:  Diagnosis Date  . Blind hypertensive eye, left 04/18/2012  . Changing skin lesion 11/23/2018  . CVA (cerebral vascular accident) ([place])   . Gait disturbance 10/05/2015   pt declined neuro referral per records  . Generalized weakness 05/15/2019  . Hard of hearing 12/02/2015   hearing aids  . Hyperlipidemia   . Hypertension   . Insomnia    "tried melatonin, advil PM, xanax and klonopin without success"  . Leukocytosis 11/07/2013   seen oncology, did not want to continue to follow there, so continued follow up at PCP.  . Osteoporosis   . Polyneuropathy   . Primary  open angle glaucoma 01/2011  . Spinal stenosis   . Stable branch retinal vein occlusion 01/06/2012  . Type 2 diabetes mellitus ([place])    "diet controlled"    Past Surgical History:  Procedure Laterality Date  . BUBBLE STUDY  05/17/2019   Procedure: BUBBLE STUDY;  Surgeon: Lelon Perla, MD;  Location: [facility] ENDOSCOPY;  Service: Cardiovascular;;  . EYE SURGERY    . [place]  2005  . TEE WITHOUT CARDIOVERSION N/A 05/17/2019   Procedure: TRANSESOPHAGEAL ECHOCARDIOGRAM (TEE);  Surgeon: Lelon Perla, MD;  Location: [facility] ENDOSCOPY;  Service: Cardiovascular;  Laterality: N/A;  . TOTAL HIP ARTHROPLASTY Left 1991  . TOTAL HIP ARTHROPLASTY Right ?   x 2    Current Outpatient  Medications  Medication Sig Dispense Refill  . aspirin 325 MG EC tablet Take 1 tablet (325 mg total) by mouth daily. Take only for 3 months. 60 tablet 0  . Ca Carb-Glucos-Chond-Phelloden (FLEXI JOINT RELIEF PLUS PO) Take 2 capsules by mouth daily.    . Cholecalciferol (VITAMIN D-3) 25 MCG (1000 UT) CAPS Take 1,000 Units by mouth daily.    . dorzolamide-timolol (COSOPT) 22.3-6.8 MG/ML ophthalmic solution Place 1 drop into the left eye 2 (two) times daily.     Marland Kitchen glipiZIDE (GLUCOTROL XL) 2.5 MG 24 hr tablet TAKE 1 TABLET BY MOUTH  DAILY WITH BREAKFAST (Patient taking differently: Take 2.5 mg by mouth daily with breakfast. ) 90 tablet 3  . hydrALAZINE (APRESOLINE) 25 MG tablet TAKE 1 TABLET BY MOUTH  EVERY 8 HOURS. (Patient taking differently: Take 25 mg by mouth every 8 (eight) hours. Marland Kitchen) 270 tablet 3  . KRILL OIL PO Take 1 capsule by mouth daily.     Marland Kitchen lisinopril (ZESTRIL) 40 MG tablet Take 1 tablet (40 mg total) by mouth daily. 90 tablet 1  . Melatonin 5 MG TABS Take 5 mg by mouth at bedtime as needed (for sleep).    . metoprolol succinate (TOPROL-XL) 25 MG 24 hr tablet Take 1 tablet (25 mg total) by mouth daily. 90 tablet 1  . Multiple Vitamins-Minerals (ONE-A-[place] WOMENS 50 PLUS) TABS Take 1 tablet by mouth daily.    . NON FORMULARY Take 2 capsules by mouth See admin instructions. UriVArx - Clinically Proven Bladder Control Capsules: Take 2 capsules by mouth once a [place]    . polyethylene glycol powder (GLYCOLAX/MIRALAX) 17 GM/SCOOP powder Take 17 g by mouth daily as needed for mild constipation.    . pravastatin (PRAVACHOL) 40 MG tablet Take 1 tablet (40 mg total) by mouth daily. 90 tablet 3  . VYZULTA 0.024 % SOLN Place 1 drop into the left eye at bedtime.     . clopidogrel (PLAVIX) 75 MG tablet Take 1 tablet (75 mg total) by mouth daily. (Patient not taking: Reported on 07/30/2019) 90 tablet 3   No current facility-administered medications for this visit.     ALLERGIES: Amlodipine besylate and  Flu virus vaccine  Family History  Problem Relation Age of Onset  . Heart disease Brother     Social History   Socioeconomic History  . Marital status: Divorced    Spouse name: Not on file  . Number of children: 3  . Years of education: 43  . Highest education level: Not on file  Occupational History  . Not on file  Tobacco Use  . Smoking status: Never Smoker  . Smokeless tobacco: Never Used  Substance and Sexual Activity  . Alcohol use: No  . Drug use:  No  . Sexual activity: Never    Birth control/protection: Post-menopausal  Other Topics Concern  . Not on file  Social History Narrative   Divorced. 3 children [name], Max and [name]. Lives alone.   High school graduate.   Takes a daily vitamin.   Wears her seatbelt. Wears a hearing aid. Wears dentures.   Requires a cane for assistive device for walking.   Smoke detector in the home.   Feels safe in her relationships.   Social Determinants of Health   Financial Resource Strain:   . Difficulty of Paying Living Expenses:   Food Insecurity:   . Worried About Charity fundraiser in the Last Year:   . Arboriculturist in the Last Year:   Transportation Needs:   . Film/video editor (Medical):   Marland Kitchen Lack of Transportation (Non-Medical):   Physical Activity:   . Days of Exercise per Week:   . Minutes of Exercise per Session:   Stress:   . Feeling of Stress :   Social Connections:   . Frequency of Communication with Friends and Family:   . Frequency of Social Gatherings with Friends and Family:   . Attends Religious Services:   . Active Member of Clubs or Organizations:   . Attends Archivist Meetings:   Marland Kitchen Marital Status:   Intimate Partner Violence:   . Fear of Current or Ex-Partner:   . Emotionally Abused:   Marland Kitchen Physically Abused:   . Sexually Abused:     Review of Systems  All other systems reviewed and are negative.   PHYSICAL EXAMINATION:    BP (!) 144/78   Pulse 76   Temp 97.7 F (36.5  C) (Temporal)   Ht 5\' 2"  (1.575 m)   Wt 130 lb (59 kg)   LMP 03/21/1978 (Approximate)   BMI 23.78 kg/m     General appearance: alert, cooperative and appears stated age  Attempted EMB Consent for the procedure.  Sterile prep of the vagina with Hibiclens.  Paracervical block with 7 cc 1% lidocaine.  Tenaculum to the anterior cervical lip.  Cervix with obvious stenosis.  Attempt to open canal with scalpel 11 blade. Probing with os finder.  No recognizable cervical os palpable. Cervix atrophic and friable. Silver nitrate painted over the cervix.  Minimal EBL. No complications.   EMB procedure was aborted.   Chaperone was present for exam.  ASSESSMENT  Fluid in endometrial canal with endometrial thickening.  Cervical stenosis causing unsuccessful endometrial sampling.  Hx CVA.  Currently off Plavix for the procedure today. Night time urinary incontinence.   PLAN  I discussed the inability to do office endometrial sampling due to the patient's anatomy. We reviewed the potential for malignant cells in the uterus.  I am recommending referral to [place] for further evaluation and treatment.  I recommend she start back on her Plavix tomorrow.  She will call for heavy vaginal bleeding, pain, or fever.  She will reach out to her PCP to see if she can take her antiHTN med in the am with the hope that she may have better bladder control at night.    An After Visit Summary was printed and given to the patient.

## 2019-07-31 ENCOUNTER — Telehealth: Payer: Self-pay | Admitting: *Deleted

## 2019-07-31 NOTE — Telephone Encounter (Signed)
F/u with Sharyn Lull at [facility], she has left message for patient to return call to schedule.

## 2019-07-31 NOTE — Telephone Encounter (Signed)
Called and left the patient a message to call the office back. Patient needs to be scheduled for a new patient appt  °

## 2019-08-01 ENCOUNTER — Telehealth: Payer: Self-pay | Admitting: *Deleted

## 2019-08-01 NOTE — Telephone Encounter (Signed)
Patient's son called and scheduled the new patient appt. Son given the address and phone number for the clinic. Son also given the policy for parking, mask and visitors

## 2019-08-01 NOTE — Telephone Encounter (Signed)
Called and left the patient a message to call the office back. Need to schedule an new patient appt

## 2019-08-01 NOTE — Telephone Encounter (Signed)
Per review of Epic, patient scheduled for OV with Dr. Berline Lopes on 5/18 at 12pm.   Routing to Dr. Antony Blackbird.   Encounter closed.   Cc: Magdalene Patricia

## 2019-08-02 NOTE — Progress Notes (Signed)
GYNECOLOGIC ONCOLOGY NEW PATIENT CONSULTATION   Patient Name: Madison Martinez  Patient Age: 86 y.o. Date of Service: 5/18 Referring Provider: Ma Hillock, DO [address] [place],  [place] [address]   Primary Care Provider: Ma Hillock, DO Consulting Provider: Jeral Pinch, MD   Assessment/Plan:  Postmenopausal patient with incidental finding of minimally thickened endometrial lining, fluid-filled.  Discussed evolution of CT scan which incidentally found an adnexal mass and ultrasound which was to further work this up.  Ultrasound did not reveal an adnexal mass but did note some fluid in the endometrial canal with a lining measuring 5 mm.  We discussed that this is likely due to cervical stenosis.    Given her cervical stenosis, biopsy in clinic is not feasible.  I took a biopsy of her cervix today given appearance of the cervix, which I suspect is secondary to recent attempted biopsy.  We discussed options of close surveillance with repeat ultrasound in several months versus outpatient surgery to perform an endometrial biopsy or D&C under light anesthesia.  We discussed the risks and benefits of each in terms of the possibility that her endometrial lining is thickened due to hyperplasia or malignancy.  My suspicion is that she does not have uterine cancer and I feel comfortable with close surveillance.  Both she and her son voiced wishing to avoid outpatient surgery at this time.  I have stressed the importance of calling me if she develops any bleeding, pain, or other symptoms.  She was scheduled for a repeat ultrasound in 4 months with a visit to see me after.  I will call the patient with her biopsy results later this week.  A copy of this note was sent to the patient's referring provider.   38 minutes of total time was spent for this patient encounter, including preparation, face-to-face counseling with the patient and coordination of care, and documentation of the  encounter.   Jeral Pinch, MD  Division of Gynecologic Oncology  Department of Obstetrics and Gynecology  [facility]  ___________________________________________  Chief Complaint: Chief Complaint  Patient presents with  . Thickened endometrium    New Patient    History of Present Illness:  Madison Martinez is a 86 y.o. y.o. female who is seen in consultation at the request of Dr. Quincy Simmonds for an evaluation of thickened endometrium.  The patient had an incidental finding of a possible right ovarian mass on CT scan for nausea and emesis, ultimately being diagnosed with colitis. For follow-up, she underwent pelvic ultrasound in mid April which showed fluid in the endometrial canal and a lining measuring 5 mm. Ovaries not visualized.  On 5/11, endometrial biopsy was attempted with cervical stenosis noted. Attempt to open the canal with a scalpel and os finder was performed, but it was not possible to access the endometrial cavity. Cervix noted to be atrophic and friable.  Today the patient reports overall doing well.  She denies any abdominal or pelvic pain.  She denies any vaginal bleeding or discharge.  She endorses having a good appetite without nausea or emesis.  She reports normal bowel and bladder function.  PAST MEDICAL HISTORY:  Past Medical History:  Diagnosis Date  . Blind hypertensive eye, left 04/18/2012  . Changing skin lesion 11/23/2018  . CVA (cerebral vascular accident) ([place])   . Gait disturbance 10/05/2015   pt declined neuro referral per records  . Generalized weakness 05/15/2019  . Hard of hearing 12/02/2015   hearing  aids  . Hyperlipidemia   . Hypertension   . Insomnia    "tried melatonin, advil PM, xanax and klonopin without success"  . Leukocytosis 11/07/2013   seen oncology, did not want to continue to follow there, so continued follow up at PCP.  . Osteoporosis   . Polyneuropathy   . Primary open angle glaucoma 01/2011  . Spinal  stenosis   . Stable branch retinal vein occlusion 01/06/2012  . Type 2 diabetes mellitus ([place])    "diet controlled"     PAST SURGICAL HISTORY:  Past Surgical History:  Procedure Laterality Date  . BUBBLE STUDY  05/17/2019   Procedure: BUBBLE STUDY;  Surgeon: Lelon Perla, MD;  Location: [facility] ENDOSCOPY;  Service: Cardiovascular;;  . EYE SURGERY    . [place]  2005  . TEE WITHOUT CARDIOVERSION N/A 05/17/2019   Procedure: TRANSESOPHAGEAL ECHOCARDIOGRAM (TEE);  Surgeon: Lelon Perla, MD;  Location: [facility] ENDOSCOPY;  Service: Cardiovascular;  Laterality: N/A;  . TOTAL HIP ARTHROPLASTY Left 1991  . TOTAL HIP ARTHROPLASTY Right ?   x 2    OB/GYN HISTORY:  OB History  Gravida Para Term Preterm AB Living  3 3       3   SAB TAB Ectopic Multiple Live Births               # Outcome Date GA Lbr Len/2nd Weight Sex Delivery Anes PTL Lv  3 Para           2 Para           1 Para             Patient's last menstrual period was 03/21/1978 (approximate).  Age at menarche: unsure  Age at menopause: 20 Hx of HRT: denies Hx of STDs: denies Last pap: 2021, negative History of abnormal pap smears: no  SCREENING STUDIES:  Last mammogram: years ago  Last colonoscopy: years ago  MEDICATIONS: Outpatient Encounter Medications as of 08/06/2019  Medication Sig  . aspirin 325 MG EC tablet Take 1 tablet (325 mg total) by mouth daily. Take only for 3 months.  . Ca Carb-Glucos-Chond-Phelloden (FLEXI JOINT RELIEF PLUS PO) Take 2 capsules by mouth daily.  . Cholecalciferol (VITAMIN D-3) 25 MCG (1000 UT) CAPS Take 1,000 Units by mouth daily.  . clopidogrel (PLAVIX) 75 MG tablet Take 1 tablet (75 mg total) by mouth daily.  . dorzolamide-timolol (COSOPT) 22.3-6.8 MG/ML ophthalmic solution Place 1 drop into the left eye 2 (two) times daily.   Marland Kitchen glipiZIDE (GLUCOTROL XL) 2.5 MG 24 hr tablet TAKE 1 TABLET BY MOUTH  DAILY WITH BREAKFAST (Patient taking differently: Take 2.5 mg by mouth daily with  breakfast. )  . hydrALAZINE (APRESOLINE) 25 MG tablet TAKE 1 TABLET BY MOUTH  EVERY 8 HOURS. (Patient taking differently: Take 25 mg by mouth every 8 (eight) hours. Marland Kitchen)  . KRILL OIL PO Take 1 capsule by mouth daily.   Marland Kitchen lisinopril (ZESTRIL) 40 MG tablet Take 1 tablet (40 mg total) by mouth daily.  . Melatonin 5 MG TABS Take 5 mg by mouth at bedtime as needed (for sleep).  . metoprolol succinate (TOPROL-XL) 25 MG 24 hr tablet Take 1 tablet (25 mg total) by mouth daily.  . Multiple Vitamins-Minerals (ONE-A-[place] WOMENS 50 PLUS) TABS Take 1 tablet by mouth daily.  . NON FORMULARY Take 2 capsules by mouth See admin instructions. UriVArx - Clinically Proven Bladder Control Capsules: Take 2 capsules by mouth once a [place]  . polyethylene glycol  powder (GLYCOLAX/MIRALAX) 17 GM/SCOOP powder Take 17 g by mouth daily as needed for mild constipation.  . pravastatin (PRAVACHOL) 40 MG tablet Take 1 tablet (40 mg total) by mouth daily.  Marland Kitchen VYZULTA 0.024 % SOLN Place 1 drop into the left eye at bedtime.    No facility-administered encounter medications on file as of 08/06/2019.    ALLERGIES:  Allergies  Allergen Reactions  . Amlodipine Besylate Other (See Comments)    Vertigo  . Flu Virus Vaccine Swelling    Site of swelling not known by son     FAMILY HISTORY:  Family History  Problem Relation Age of Onset  . Heart disease Brother   . Cancer Other        prostate  . Ovarian cancer Neg Hx   . Breast cancer Neg Hx   . Uterine cancer Neg Hx   . Colon cancer Neg Hx      SOCIAL HISTORY:    Social Connections:   . Frequency of Communication with Friends and Family:   . Frequency of Social Gatherings with Friends and Family:   . Attends Religious Services:   . Active Member of Clubs or Organizations:   . Attends Archivist Meetings:   Marland Kitchen Marital Status:     REVIEW OF SYSTEMS:  Denies appetite changes, fevers, chills, fatigue, unexplained weight changes. Denies hearing loss, neck lumps  or masses, mouth sores, ringing in ears or voice changes. Denies cough or wheezing.  Denies shortness of breath. Denies chest pain or palpitations. Denies leg swelling. Denies abdominal distention, pain, blood in stools, constipation, diarrhea, nausea, vomiting, or early satiety. Denies pain with intercourse, dysuria, frequency, hematuria or incontinence. Denies hot flashes, pelvic pain, vaginal bleeding or vaginal discharge.   Denies joint pain, back pain or muscle pain/cramps. Denies itching, rash, or wounds. Denies dizziness, headaches, numbness or seizures. Denies swollen lymph nodes or glands, denies easy bruising or bleeding. Denies anxiety, depression, confusion, or decreased concentration.  Physical Exam:  Vital Signs for this encounter:  Blood pressure 140/69, pulse 75, temperature 98.8 F (37.1 C), temperature source Oral, resp. rate 18, weight 135 lb (61.2 kg), last menstrual period 03/21/1978, SpO2 98 %. Body mass index is 24.69 kg/m. General: Alert, oriented, no acute distress.  Hard of hearing, hearing aids in place. HEENT: Normocephalic, atraumatic. Sclera anicteric.  Chest: Clear to auscultation bilaterally. No wheezes, rhonchi, or rales. Cardiovascular: Regular rate and rhythm, no murmurs, rubs, or gallops.  Abdomen: Normoactive bowel sounds. Soft, nondistended, nontender to palpation. No masses or hepatosplenomegaly appreciated. No palpable fluid wave.  Extremities: Grossly normal range of motion. Warm, well perfused. No edema bilaterally.  Skin: No rashes or lesions.  Lymphatics: No cervical, supraclavicular, or inguinal adenopathy.  GU:  Normal external female genitalia.  No lesions. No discharge or bleeding.             Bladder/urethra:  No lesions or masses, well supported bladder             Vagina: Moderately atrophic vaginal mucosa.             Cervix: Cervix somewhat friable especially centrally.  Os noted however visibly quite stenotic.  Some plaque-like tan  residue over parts of the cervix, close to where friable.  Given appearance of the cervix, biopsy taken from 1 of these areas at 1:00 after the area was cleaned with Betadine.  Silver nitrate was used to achieve hemostasis.  Uterus: Small, mobile, no parametrial involvement or nodularity.             Adnexa: no masses appreciated.  LABORATORY AND RADIOLOGIC DATA:  Outside medical records were reviewed to synthesize the above history, along with the history and physical obtained during the visit.   Lab Results  Component Value Date   WBC 10.2 06/27/2019   HGB 12.1 06/27/2019   HCT 35.9 (L) 06/27/2019   PLT 184.0 06/27/2019   GLUCOSE 145 (H) 05/18/2019   CHOL 190 05/15/2019   TRIG 276 (H) 05/15/2019   HDL 31 (L) 05/15/2019   LDLDIRECT 127.0 12/07/2018   LDLCALC 104 (H) 05/15/2019   ALT 16 05/14/2019   AST 30 05/14/2019   NA 138 05/18/2019   K 4.2 05/18/2019   CL 106 05/18/2019   CREATININE 1.09 (H) 05/18/2019   BUN 21 05/18/2019   CO2 21 (L) 05/18/2019   TSH 5.10 (H) 06/27/2019   INR 1.1 05/14/2019   HGBA1C 5.5 05/15/2019   Pelvic ultrasound 4/13: IMPRESSION: Nonspecific small amount of endometrial and endocervical fluid. 5 mm. Small amount of nonspecific endometrial and endocervical fluid. No definite mass Nonvisualization of ovaries likely obscured by bowel; unable to characterize the abnormality identified by CT. This lesion could be evaluated by MRI with and without contrast if clinically indicated.  CT A/P on 2/23: Reproductive: The uterus is grossly unremarkable. There is a 2.5 x 2.5 cm high attenuating structure associated with the right ovary which is not characterized on this CT. This may represent normal ovarian tissue or an ovarian lesion. Evaluation of the pelvic structures is very limited due to streak artifact caused by bilateral metallic hip arthroplasties. Further evaluation with ultrasound on a nonemergent/outpatient basis recommended.

## 2019-08-06 ENCOUNTER — Other Ambulatory Visit: Payer: Self-pay

## 2019-08-06 ENCOUNTER — Inpatient Hospital Stay: Payer: Medicare Other | Attending: Gynecologic Oncology | Admitting: Gynecologic Oncology

## 2019-08-06 ENCOUNTER — Encounter: Payer: Self-pay | Admitting: Gynecologic Oncology

## 2019-08-06 VITALS — BP 140/69 | HR 75 | Temp 98.8°F | Resp 18 | Wt 135.0 lb

## 2019-08-06 DIAGNOSIS — N889 Noninflammatory disorder of cervix uteri, unspecified: Secondary | ICD-10-CM | POA: Insufficient documentation

## 2019-08-06 DIAGNOSIS — Z8042 Family history of malignant neoplasm of prostate: Secondary | ICD-10-CM | POA: Diagnosis not present

## 2019-08-06 DIAGNOSIS — R9389 Abnormal findings on diagnostic imaging of other specified body structures: Secondary | ICD-10-CM | POA: Diagnosis not present

## 2019-08-06 DIAGNOSIS — N882 Stricture and stenosis of cervix uteri: Secondary | ICD-10-CM | POA: Diagnosis not present

## 2019-08-06 DIAGNOSIS — Z780 Asymptomatic menopausal state: Secondary | ICD-10-CM | POA: Diagnosis not present

## 2019-08-06 NOTE — Patient Instructions (Signed)
It was a pleasure meeting you today.  I will call you with the biopsy results when they are back.  As long as these are normal, then we discussed the plan of waiting 4 months and repeating an ultrasound to look at the lining of the uterus.  If you develop any vaginal bleeding or other symptoms before that appointment, please call me at [phone].

## 2019-08-07 LAB — SURGICAL PATHOLOGY

## 2019-08-09 ENCOUNTER — Telehealth: Payer: Self-pay | Admitting: Gynecologic Oncology

## 2019-08-09 NOTE — Telephone Encounter (Signed)
I called and left a message for the patient and her son regarding biopsy results from clinic earlier this week.  Call back requested.  Jeral Pinch MD Gynecologic Oncology

## 2019-08-12 ENCOUNTER — Telehealth: Payer: Self-pay | Admitting: Gynecologic Oncology

## 2019-08-12 NOTE — Telephone Encounter (Signed)
The patient's son returned my call regarding his mother's biopsy.  We reviewed the results.  I suspect that the appearance of the cervix and necrosis seen is secondary to the atrophic and friable nature of her cervix given her age and recent manipulation/trauma from the attempted endometrial biopsy in clinic.  We discussed that the only way to truly rule out a precancerous or cancerous process on the cervix would be to repeat biopsies or perform a larger biopsy.  My recommendation would be that unless the patient develops new symptoms, such as vaginal bleeding, that we defer a repeat exam and possible repeat biopsies until her visit in 3 months.  She is currently scheduled to see me in mid August immediately after repeat transvaginal and pelvic ultrasound.  We will make a decision at that time based on the appearance of her endometrial lining and her cervix whether she needs repeat cervical biopsies in clinic or if we need to move to schedule anything in the operating room.  The patient's son, Madison [place], voiced understanding and agreement with this plan.  Reviewed the date and times of her follow-up appointments in mid August.  Jeral Pinch MD Gynecologic Oncology

## 2019-09-16 ENCOUNTER — Telehealth: Payer: Self-pay | Admitting: *Deleted

## 2019-09-16 NOTE — Telephone Encounter (Signed)
Called and spoke with the patient's son. Moved appts from 8/17 to 8/16

## 2019-09-20 ENCOUNTER — Other Ambulatory Visit: Payer: Self-pay

## 2019-09-20 ENCOUNTER — Ambulatory Visit (INDEPENDENT_AMBULATORY_CARE_PROVIDER_SITE_OTHER): Payer: Medicare Other | Admitting: Family Medicine

## 2019-09-20 ENCOUNTER — Encounter: Payer: Self-pay | Admitting: Family Medicine

## 2019-09-20 VITALS — BP 148/80 | HR 78 | Temp 98.2°F | Resp 16 | Ht 62.0 in | Wt 135.0 lb

## 2019-09-20 DIAGNOSIS — M4800 Spinal stenosis, site unspecified: Secondary | ICD-10-CM | POA: Diagnosis not present

## 2019-09-20 DIAGNOSIS — I10 Essential (primary) hypertension: Secondary | ICD-10-CM

## 2019-09-20 DIAGNOSIS — M5416 Radiculopathy, lumbar region: Secondary | ICD-10-CM

## 2019-09-20 DIAGNOSIS — E119 Type 2 diabetes mellitus without complications: Secondary | ICD-10-CM | POA: Diagnosis not present

## 2019-09-20 DIAGNOSIS — N183 Chronic kidney disease, stage 3 unspecified: Secondary | ICD-10-CM | POA: Diagnosis not present

## 2019-09-20 DIAGNOSIS — Z9889 Other specified postprocedural states: Secondary | ICD-10-CM

## 2019-09-20 LAB — POCT GLYCOSYLATED HEMOGLOBIN (HGB A1C)
HbA1c POC (<> result, manual entry): 5.2 % (ref 4.0–5.6)
HbA1c, POC (controlled diabetic range): 5.2 % (ref 0.0–7.0)
HbA1c, POC (prediabetic range): 5.2 % — AB (ref 5.7–6.4)
Hemoglobin A1C: 5.2 % (ref 4.0–5.6)

## 2019-09-20 MED ORDER — GLIPIZIDE ER 2.5 MG PO TB24: ORAL_TABLET | ORAL | 1 refills | Status: DC

## 2019-09-20 MED ORDER — LISINOPRIL 40 MG PO TABS: 40.0000 mg | ORAL_TABLET | Freq: Every day | ORAL | 1 refills | Status: DC

## 2019-09-20 MED ORDER — METOPROLOL SUCCINATE ER 25 MG PO TB24: 25.0000 mg | ORAL_TABLET | Freq: Every day | ORAL | 1 refills | Status: DC

## 2019-09-20 NOTE — Progress Notes (Signed)
Madison Martinez , 1927/12/13, 86 y.o., female MRN: [id number] Patient Care Team    Relationship Specialty Notifications Start End  Ma Hillock, DO PCP - General Family Medicine  09/01/16   Verdell Carmine, MD Referring Physician Internal Medicine  09/01/16   Renelda Loma, OD  Optometry  09/01/16   [facility], [facility]  Ophthalmology  06/16/17   Manuela Neptune, DPM Referring Physician Podiatry  06/16/17     Chief Complaint  Patient presents with  . Hypertension    Pt has been doing well.   . Diabetes     Subjective:  Madison Martinez  is a 86 y.o. female presents for Essential hypertension/hyperlipidemia/CKD3/CVA/aortic atherosclerosis/chronic anticoagulation/CVA Pt reports  plans with Metoprolol-XL 25 mg daily, hydralazine 25 mg TID  and lisinopril 40 mg daily. Patient denies chest pain, shortness of breath, dizziness or lower extremity edema.  She is taking statin and has added Krill oil.  She reports compliance with Plavix and 325 mg ASA.  She has followed with neurology status post stroke. Diet: Monitors her diet, low-sodium Exercise: Does not exercise much, walks with a cane RF: Hypertension, hyperlipidemia, family history of heart disease  Type 2 diabetes mellitus without complication, without long-term current use of insulin (HCC) Pt reports compliance with glipizide 2.5 mg daily, tolerating well.Patient denies dizziness, hyperglycemic or hypoglycemic events. Patient denies numbness, tingling in the extremities or nonhealing wounds of feet.  PNA series: She has declined Pneumovax Flu shot: She declines flu shot (recommneded yearly) Foot exam: We will complete at next visit Eye exam: Yearly eye exams encouraged A1c: 6.6> 5.5 >5.2  History excision of lamina of lumbar vertebra for decompression of spinal cord: History of degenerative anterior listhesis at the L4-5 level post decompression.  Diffuse lumbar spine spondylosis.  She has been seen by orthopedics, Dr. Lorin Mercy, last  11/2017.  Patient reports she does not want to have a surgical procedure, but she also does not want to have to resort to a daily pain medication either.  She had been tried on tramadol 50 mg twice daily by her orthopedic team.  She states it was not helpful with her pain and she has not used it in a very long time.   Monoclonal B-cell lymphocytosis She is suppose to follow with her heme/onc every 3 months- but has not in some time. CBC has been stable here and she would like to continue following cbc here.   Elevated TSH: Last TSH 06/2019 just mildly elevated TSH.  Like to not to start medication at this time.  We will continue to monitor  EEG-05/15/2019 IMPRESSION: This study is suggestive of non specific cortical dysfunction in left frontotemporal region. No seizures or definite epileptiform discharges were seen throughout the recording. [place]   MR ANGIO HEAD WO CONTRAST- 05/15/2019 IMPRESSION: 1. Two punctate subcentimeter acute ischemic nonhemorrhagic cortical infarcts involving the left frontoparietal region as above, likely embolic in nature. 2. Underlying moderately advanced cerebral atrophy with chronic small vessel ischemic disease.  MR BRAIN WO CONTRAST- 05/15/2019 IMPRESSION: 1. Approximate 8 mm severe stenosis extending from the left carotid terminus into the left M1 segment. Left MCA branches are perfused distally. 2. 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 Short-segment approximate 60% right V4 stenosis as above. 3. Extensive atheromatous change throughout the carotid siphons with associated moderate to advanced multifocal stenoses. 4. 5 mm cavernous right ICA aneurysm .  US RENAL-05/15/2019 Right  Kidney: Renal measurements: 8.6 x 5.3 x 3.1 cm = volume: 73.8 mL . Echogenicity within normal limits. No worrisome mass, shadowing calculus or hydronephrosis visualized.  Left Kidney: Renal measurements: 9.4 x 5.3 x 3.6 cm = volume: 94  mL. 0.8 x 0.6 x 0.6 cm anechoic cystic lesion in the interpolar left kidney most compatible with simple renal cyst corresponding well to a partially exophytic cyst on CT. No worrisome renal lesion. No shadowing calculus or hydronephrosis.  Bladder: Appears normal for degree of bladder distention.  IMPRESSION: Anechoic 8 mm likely simple cyst in the left kidney. Otherwise unremarkable renal ultrasound.    ECHOCARDIOGRAM COMPLETE-05/15/2019 IMPRESSIONS  1. There is a mass (0.8 cm x 0.5 cm) that appears to be attached to the right coronary cusp of the aortic valve. It does appear calcified and is best seen in the PLAX views. There is no destruction of the valve to suggest vegetation/endocarditis. 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 A TEE is recommended for better characterization in this patient with stroke.          ECHO TEE- 05/17/2019 IMPRESSIONS   Normal LV function; moderate LAE; trace AI; mild MR and TR.   Left ventricular ejection fraction, by estimation, is 55 to 60%. The left ventricle has normal function. The left ventricle has no regional wall motion abnormalities.   Right ventricular systolic function is normal. The right ventricular size is normal. Aortic Valve: The aortic valve is tricuspid. Aortic valve regurgitation is trivial. Mild to moderate aortic valve sclerosis/calcification is present, without any evidence of aortic stenosis. Aorta: The aortic root is normal in size and structure. There is moderate (Grade III) plaque involving the descending aorta.  05/20/2019-Carotid Arterial Duplex Study  Summary: Right Carotid: Velocities in the right ICA are consistent with a 1-39% stenosis. Left Carotid: Velocities in the left ICA are consistent with a 1-39% stenosis. Vertebrals: Bilateral vertebral arteries demonstrate antegrade flow.   Depression screen [facility] 2/9 07/10/2019 08/03/2018 08/03/2018 05/25/2018 11/24/2017  Decreased Interest 0 0 0 0 0  Down,  Depressed, Hopeless 0 0 0 0 0  PHQ - 2 Score 0 0 0 0 0    Allergies  Allergen Reactions  . Amlodipine Besylate Other (See Comments)    Vertigo  . Flu Virus Vaccine Swelling    Site of swelling not known by son   Social History   Tobacco Use  . Smoking status: Never Smoker  . Smokeless tobacco: Never Used  Substance Use Topics  . Alcohol use: No   Past Medical History:  Diagnosis Date  . Blind hypertensive eye, left 04/18/2012  . Changing skin lesion 11/23/2018  . CVA (cerebral vascular accident) ([place])   . Gait disturbance 10/05/2015   pt declined neuro referral per records  . Generalized weakness 05/15/2019  . Hard of hearing 12/02/2015   hearing aids  . Hyperlipidemia   . Hypertension   . Insomnia    "tried melatonin, advil PM, xanax and klonopin without success"  . Leukocytosis 11/07/2013   seen oncology, did not want to continue to follow there, so continued follow up at PCP.  . Osteoporosis   . Polyneuropathy   . Primary open angle glaucoma 01/2011  . Spinal stenosis   . Stable branch retinal vein occlusion 01/06/2012  . Type 2 diabetes mellitus ([place])    "diet controlled"   Past Surgical History:  Procedure Laterality Date  . BUBBLE STUDY  05/17/2019   Procedure: BUBBLE STUDY;  Surgeon: Stanford Breed,  Denice Bors, MD;  Location: [place];  Service: Cardiovascular;;  . EYE SURGERY    . [place]  2005  . TEE WITHOUT CARDIOVERSION N/A 05/17/2019   Procedure: TRANSESOPHAGEAL ECHOCARDIOGRAM (TEE);  Surgeon: Lelon Perla, MD;  [facility] ENDOSCOPY;  Service: Cardiovascular;  Laterality: N/A;  . TOTAL HIP ARTHROPLASTY Left 1991  . TOTAL HIP ARTHROPLASTY Right ?   x 2   Family History  Problem Relation Age of Onset  . Heart disease Brother   . Cancer Other        prostate  . Ovarian cancer Neg Hx   . Breast cancer Neg Hx   . Uterine cancer Neg Hx   . Colon cancer Neg Hx    Allergies as of 09/20/2019      Reactions   Amlodipine Besylate Other (See  Comments)   Vertigo   Flu Virus Vaccine Swelling   Site of swelling not known by son      Medication List       Accurate as of September 20, 2019  7:41 PM. If you have any questions, ask your nurse or doctor.        aspirin 325 MG EC tablet Take 1 tablet (325 mg total) by mouth daily. Take only for 3 months.   clopidogrel 75 MG tablet Commonly known as: PLAVIX Take 1 tablet (75 mg total) by mouth daily.   dorzolamide-timolol 22.3-6.8 MG/ML ophthalmic solution Commonly known as: COSOPT Place 1 drop into the left eye 2 (two) times daily.   FLEXI JOINT RELIEF PLUS PO Take 2 capsules by mouth daily.   glipiZIDE 2.5 MG 24 hr tablet Commonly known as: GLUCOTROL XL 1 tab in the morning with breakfast on Monday, Wednesday and Friday only. What changed:   how much to take  how to take this  when to take this  additional instructions Changed by: Howard Pouch, DO   hydrALAZINE 25 MG tablet Commonly known as: APRESOLINE TAKE 1 TABLET BY MOUTH  EVERY 8 HOURS. What changed: additional instructions   KRILL OIL PO Take 1 capsule by mouth daily.   lisinopril 40 MG tablet Commonly known as: ZESTRIL Take 1 tablet (40 mg total) by mouth daily.   melatonin 5 MG Tabs Take 5 mg by mouth at bedtime as needed (for sleep).   metoprolol succinate 25 MG 24 hr tablet Commonly known as: TOPROL-XL Take 1 tablet (25 mg total) by mouth daily.   NON FORMULARY Take 2 capsules by mouth See admin instructions. UriVArx - Clinically Proven Bladder Control Capsules: Take 2 capsules by mouth once a [place]   One-A-Skalla Womens 50 Plus Tabs Take 1 tablet by mouth daily.   polyethylene glycol powder 17 GM/SCOOP powder Commonly known as: GLYCOLAX/MIRALAX Take 17 g by mouth daily as needed for mild constipation.   pravastatin 40 MG tablet Commonly known as: PRAVACHOL Take 1 tablet (40 mg total) by mouth daily.   Vitamin D-3 25 MCG (1000 UT) Caps Take 1,000 Units by mouth daily.   Vyzulta 0.024 %  Soln Generic drug: Latanoprostene Bunod Place 1 drop into the left eye at bedtime.       All past medical history, surgical history, allergies, family history, immunizations and medications were updated in the EMR today and reviewed under the history and medication portions of their EMR.      ROS: Negative, with the exception of above mentioned in HPI  Objective:  BP (!) 148/80 (BP Location: Right Arm, Patient Position: Sitting, Cuff  Size: Normal)   Pulse 78   Temp 98.2 F (36.8 C) (Temporal)   Resp 16   Ht 5\' 2"  (1.575 m)   Wt 135 lb (61.2 kg)   LMP 03/21/1978 (Approximate)   SpO2 97%   BMI 24.69 kg/m  Body mass index is 24.69 kg/m. Gen: Afebrile. No acute distress.  Very pleasant elderly female.  Hard of hearing HENT: AT. [place].  Eyes:Pupils Equal Round Reactive to light, Extraocular movements intact,  Conjunctiva without redness, discharge or icterus. CV: RRR, no edema Chest: CTAB, no wheeze or crackles Skin: no rashes, purpura or petechiae.  Neuro:  Normal gait. PERLA. EOMi. Alert. Oriented x3  Psych: Normal affect, dress and demeanor. Normal speech. Normal thought content and judgment.    Assessment/Plan: Mikea Quadros Dziedzic is a 86 y.o. female present for OV for  Stage 3 chronic kidney disease Avoid NSAIDs Renally dose meds when appropriate Follow-up every 6 months  Monoclonal B-cell lymphocytosis: Patient has declined follow-up with heme-onc. Monitor CBC twice yearly  Cerebrovascular accident (CVA) due to stenosis of left middle cerebral artery (HCC)/Aneurysm of internal carotid artery/small vessel disease/cerebral atrophy/abnormal EEG/chronic anticoagulation Patient was counseled to take aspirin 325 mg daily along with Plavix 75 mg daily.   -Dual antiplatelet therapy timeline provided by neurology. She is to continue Plavix indefinitely.  Essential hypertension/hyperlipidemia Stable Continue hydralazine 25 mg 3 times daily Continue lisinopril 40 Continue  metoprolol daily Continue pravastatin 40 mg daily Follow-up every 6 months . Aortic valve mass/Aortic atherosclerosis (HCC)/aortic calcification -Referral to cardiology was placed for recommendations.  Elevated TSH: -Last collection April/2021 still mildly elevated.  We will continue to monitor every 6 months with her CBC.  History excision of lamina of lumbar vertebra for decompression of spinal cord Discussed options with patient today.  In the ER more interested in a second opinion.  She would like to see if there are injections or other possible nerve treatments that can be helpful for her. She declined pain medication today.   Reviewed expectations re: course of current medical issues.  Discussed self-management of symptoms.  Outlined signs and symptoms indicating need for more acute intervention.  Patient verbalized understanding and all questions were answered.  Patient received an After-Visit Summary.  Any changes in medications were reviewed and patient was provided with updated med list with their AVS.      Orders Placed This Encounter  Procedures  . POCT glycosylated hemoglobin (Hb A1C)   Meds ordered this encounter  Medications  . metoprolol succinate (TOPROL-XL) 25 MG 24 hr tablet    Sig: Take 1 tablet (25 mg total) by mouth daily.    Dispense:  90 tablet    Refill:  1  . lisinopril (ZESTRIL) 40 MG tablet    Sig: Take 1 tablet (40 mg total) by mouth daily.    Dispense:  90 tablet    Refill:  1  . glipiZIDE (GLUCOTROL XL) 2.5 MG 24 hr tablet    Sig: 1 tab in the morning with breakfast on Monday, Wednesday and Friday only.    Dispense:  40 tablet    Refill:  1    Please discontinue all other scripts.  Please do not request a year supply.   Referral Orders  No referral(s) requested today    Note is dictated utilizing voice recognition software. Although note has been proof read prior to signing, occasional typographical errors still can be missed. If  any questions arise, please do not hesitate to call for verification.  electronically signed by:  Howard Pouch, DO  [place]

## 2019-09-20 NOTE — Patient Instructions (Signed)
Chronic condition appt in December.  Please cancel the September appt.   Only take glipizide on Monday, Wednesday and Friday.  This is for your diabetes.   I have [facility]. They will call you to schedule.

## 2019-09-25 DIAGNOSIS — H401111 Primary open-angle glaucoma, right eye, mild stage: Secondary | ICD-10-CM | POA: Diagnosis not present

## 2019-10-23 ENCOUNTER — Other Ambulatory Visit: Payer: Self-pay

## 2019-10-23 ENCOUNTER — Ambulatory Visit: Payer: Medicare Other | Admitting: Adult Health

## 2019-10-23 ENCOUNTER — Encounter: Payer: Self-pay | Admitting: Adult Health

## 2019-10-23 ENCOUNTER — Telehealth: Payer: Self-pay | Admitting: Adult Health

## 2019-10-23 VITALS — BP 178/88 | HR 63

## 2019-10-23 DIAGNOSIS — E119 Type 2 diabetes mellitus without complications: Secondary | ICD-10-CM

## 2019-10-23 DIAGNOSIS — I63512 Cerebral infarction due to unspecified occlusion or stenosis of left middle cerebral artery: Secondary | ICD-10-CM

## 2019-10-23 DIAGNOSIS — I10 Essential (primary) hypertension: Secondary | ICD-10-CM

## 2019-10-23 DIAGNOSIS — I671 Cerebral aneurysm, nonruptured: Secondary | ICD-10-CM

## 2019-10-23 DIAGNOSIS — E785 Hyperlipidemia, unspecified: Secondary | ICD-10-CM

## 2019-10-23 NOTE — Progress Notes (Signed)
[facility] [address] [place]. [place] [address] [phone]       HOSPITAL FOLLOW UP NOTE  [name] Date of Birth:  06-09-1927 Medical Record Number:  [id number]   Reason for Referral:  hospital stroke follow up    SUBJECTIVE:   CHIEF COMPLAINT:  Chief Complaint  Patient presents with  . Follow-up    tx rm pt here for a f/u on a stroke. Pt said she is havig no new sx. Pt is here with her son Madison Martinez    HPI:   Today, 10/23/2019, [name] returns for stroke follow-up accompanied by her son, Madison Martinez.  Stable since prior visit without new or reoccurring stroke/TIA symptoms.  Continues on clopidogrel and pravastatin for secondary stroke prevention without side effects.  Blood pressure today elevated but does report monitoring at home and typically stable.  Glucose levels stable with recent A1c 5.2.  No concerns at this time.     History provided for reference purposes only Initial visit 07/10/2019 JM, [name] is being seen for hospital follow-up accompanied by her son. She has been doing well since discharge without residual deficits or symptoms. She continues to live with her son but able to maintain ADLs independently.  Ambulates with RW in the house and denies any recent falls. She has continued on DAPT without bleeding or bruising and plans on continuing for 1 month and then plavix alone as 3 months DAPT completed at that time. Continues on pravastatin 10mg  daily without myalgias -recommended increasing dosage per Dr. Erlinda Hong but for unknown reasons dosage not increase at discharge. Blood pressure today 142/82.  No concerns at this time.  Stroke admission 05/14/2019: [name] is a 86 y.o. female with history of HTN, HLD, DM2, spinal stenosis, OS glaucoma, hx BRVO who presented on 05/14/2019 with confusion and aphasia after being found down.  Stroke work-up revealed to frontal parietal cortical infarcts due to hypoperfusion in setting of dehydration and AKI with  left ICA and MCA severe stenosis. 2D echo reported right AV mobile mass but ruled out with TEE.  Recommended DAPT for 3 months then Plavix alone as previously on aspirin given severe intracranial stenosis.  HTN stable and recommended long-term BP goal 130-150 to ensure adequate perfusion with intracranial stenosis.  LDL 104 and increased pravastatin from 10 mg to 40 mg daily.  Controlled DM with A1c 5.5.  Other stroke risk factors include advanced age but no prior history of stroke.  Other active problems include resolved hyponatremia and right ICA 5 mm aneurysm and recommended follow-up outpatient for monitoring.  Evaluated by therapies and recommend discharge home with home health therapies.   Stroke:   2 frontoparietal cortical infarcts due to hypoperfusion in setting of dehydration and AKI wiht left ICA and MCA severe stenosis, although cardioembolic source can not be completely ruled out  Code Stroke CT head No acute abnormality. Moderate atrophy and small vessel disease. ASPECTS 10.     MRI  2 punctate L frontoparietal cortical infarcts. Moderate advanced Small vessel disease and Atrophy. L paranasal sinus dz.   MRA  Severe L ICA terminus to L M1 stenosis. L V4 occlusion beyond takeoff of L PICA. R V4 60% stenosis. Extensive atherosclerosis B ICA siphons w/ moderate to advanced focal stenoses. R ICA 76mm aneurysm.   Carotid Doppler  B ICA 1-39% stenosis, VAs antegrade   2D Echo EF 60-65%. Right AV mobile mass  TEE normal, no masses negative bubble  EEG  left frontal temporal intermittent slowing, no seizure  LDL 104 -increase pravastatin 10 mg to 40 mg daily  HgbA1c 5.5 -controlled DM  aspirin 81 mg daily prior to admission, now on aspirin 325 mg daily and plavix DAPT. Continue DAPT x 3 months the plavix alone given severe intracranial stenosis.  Therapy recommendations:  HH PT, HH OT, HH SLP   Disposition:   Home    ROS:   14 system review of systems performed and negative with  exception of see HPI  PMH:  Past Medical History:  Diagnosis Date  . Blind hypertensive eye, left 04/18/2012  . Changing skin lesion 11/23/2018  . CVA (cerebral vascular accident) ([place])   . Gait disturbance 10/05/2015   pt declined neuro referral per records  . Generalized weakness 05/15/2019  . Hard of hearing 12/02/2015   hearing aids  . Hyperlipidemia   . Hypertension   . Insomnia    "tried melatonin, advil PM, xanax and klonopin without success"  . Leukocytosis 11/07/2013   seen oncology, did not want to continue to follow there, so continued follow up at PCP.  . Osteoporosis   . Polyneuropathy   . Primary open angle glaucoma 01/2011  . Spinal stenosis   . Stable branch retinal vein occlusion 01/06/2012  . Type 2 diabetes mellitus ([place])    "diet controlled"    PSH:  Past Surgical History:  Procedure Laterality Date  . BUBBLE STUDY  05/17/2019   Procedure: BUBBLE STUDY;  Surgeon: Lelon Perla, MD;  Location: [facility] ENDOSCOPY;  Service: Cardiovascular;;  . EYE SURGERY    . [place]  2005  . TEE WITHOUT CARDIOVERSION N/A 05/17/2019   Procedure: TRANSESOPHAGEAL ECHOCARDIOGRAM (TEE);  Surgeon: Lelon Perla, MD;  Location: [facility] ENDOSCOPY;  Service: Cardiovascular;  Laterality: N/A;  . TOTAL HIP ARTHROPLASTY Left 1991  . TOTAL HIP ARTHROPLASTY Right ?   x 2    Social History:  Social History   Socioeconomic History  . Marital status: Divorced    Spouse name: Not on file  . Number of children: 3  . Years of education: 72  . Highest education level: Not on file  Occupational History  . Not on file  Tobacco Use  . Smoking status: Never Smoker  . Smokeless tobacco: Never Used  Vaping Use  . Vaping Use: Never used  Substance and Sexual Activity  . Alcohol use: No  . Drug use: No  . Sexual activity: Never    Birth control/protection: Post-menopausal  Other Topics Concern  . Not on file  Social History Narrative   Divorced. 3 children Madison Martinez, Madison Martinez and  Madison Martinez. Lives alone.   High school graduate.   Takes a daily vitamin.   Wears her seatbelt. Wears a hearing aid. Wears dentures.   Requires a cane for assistive device for walking.   Smoke detector in the home.   Feels safe in her relationships.   Social Determinants of Health   Financial Resource Strain:   . Difficulty of Paying Living Expenses:   Food Insecurity:   . Worried About Charity fundraiser in the Last Year:   . Arboriculturist in the Last Year:   Transportation Needs:   . Film/video editor (Medical):   Marland Kitchen Lack of Transportation (Non-Medical):   Physical Activity:   . Days of Exercise per Week:   . Minutes of Exercise per Session:   Stress:   . Feeling of Stress :   Social Connections:   .  Frequency of Communication with Friends and Family:   . Frequency of Social Gatherings with Friends and Family:   . Attends Religious Services:   . Active Member of Clubs or Organizations:   . Attends Archivist Meetings:   Marland Kitchen Marital Status:   Intimate Partner Violence:   . Fear of Current or Ex-Partner:   . Emotionally Abused:   Marland Kitchen Physically Abused:   . Sexually Abused:     Family History:  Family History  Problem Relation Age of Onset  . Heart disease Brother   . Cancer Other        prostate  . Ovarian cancer Neg Hx   . Breast cancer Neg Hx   . Uterine cancer Neg Hx   . Colon cancer Neg Hx     Medications:   Current Outpatient Medications on File Prior to Visit  Medication Sig Dispense Refill  . Omega-3 300 MG CAPS Take 1 tablet by mouth daily.    . Ca Carb-Glucos-Chond-Phelloden (FLEXI JOINT RELIEF PLUS PO) Take 2 capsules by mouth daily.    . Cholecalciferol (VITAMIN D-3) 25 MCG (1000 UT) CAPS Take 1,000 Units by mouth daily.    . clopidogrel (PLAVIX) 75 MG tablet Take 1 tablet (75 mg total) by mouth daily. 90 tablet 3  . dorzolamide-timolol (COSOPT) 22.3-6.8 MG/ML ophthalmic solution Place 1 drop into the right eye 2 (two) times daily.    Marland Kitchen  glipiZIDE (GLUCOTROL XL) 2.5 MG 24 hr tablet 1 tab in the morning with breakfast on Monday, Wednesday and Friday only. 40 tablet 1  . hydrALAZINE (APRESOLINE) 25 MG tablet TAKE 1 TABLET BY MOUTH  EVERY 8 HOURS. (Patient taking differently: Take 25 mg by mouth every 8 (eight) hours. Marland Kitchen) 270 tablet 3  . KRILL OIL PO Take 1 capsule by mouth daily.     Marland Kitchen lisinopril (ZESTRIL) 40 MG tablet Take 1 tablet (40 mg total) by mouth daily. 90 tablet 1  . Melatonin 5 MG TABS Take 5 mg by mouth at bedtime as needed (for sleep).    . metoprolol succinate (TOPROL-XL) 25 MG 24 hr tablet Take 1 tablet (25 mg total) by mouth daily. 90 tablet 1  . Multiple Vitamins-Minerals (ONE-A-[place] WOMENS 50 PLUS) TABS Take 1 tablet by mouth daily.    . NON FORMULARY Take 2 capsules by mouth See admin instructions. UriVArx - Clinically Proven Bladder Control Capsules: Take 2 capsules by mouth once a [place]    . polyethylene glycol powder (GLYCOLAX/MIRALAX) 17 GM/SCOOP powder Take 17 g by mouth daily as needed for mild constipation.    . pravastatin (PRAVACHOL) 40 MG tablet Take 1 tablet (40 mg total) by mouth daily. 90 tablet 3  . VYZULTA 0.024 % SOLN Place 1 drop into the left eye at bedtime.      No current facility-administered medications on file prior to visit.    Allergies:   Allergies  Allergen Reactions  . Amlodipine Besylate Other (See Comments)    Vertigo  . Flu Virus Vaccine Swelling    Site of swelling not known by son      OBJECTIVE:  Vitals  Vitals:   10/23/19 1136  BP: (!) 178/88  Pulse: 63   There is no height or weight on file to calculate BMI. No exam data present   Physical exam  General: well developed, well nourished, very pleasant elderly Caucasian female, seated, in no evident distress Head: head normocephalic and atraumatic.   Neck: supple with no carotid or  supraclavicular bruits Cardiovascular: regular rate and rhythm, no murmurs Musculoskeletal: no deformity Skin:  no  rash/petichiae Vascular:  Normal pulses all extremities   Neurologic Exam Mental Status: Awake and fully alert. Normal speech and language. Oriented to place and time. Recent and remote memory intact. Attention span, concentration and fund of knowledge appropriate. Mood and affect appropriate.  Cranial Nerves: Fundoscopic exam reveals sharp disc in right eye as patient legally blind left eye.  Pupils equal, briskly reactive to light. Extraocular movements full without nystagmus. Visual fields full to confrontation.  HOH bilaterally. Facial sensation intact. Face, tongue, palate moves normally and symmetrically.  Motor: Normal bulk and tone. Normal strength in all tested extremity muscles. Sensory.: intact to touch , pinprick , position and vibratory sensation.  Coordination: Rapid alternating movements normal in all extremities. Finger-to-nose and heel-to-shin performed accurately bilaterally. Gait and Station: Deferred as RW not present during visit Reflexes: 1+ and symmetric. Toes downgoing.        ASSESSMENT/PLAN: Madison Martinez is a 86 y.o. year old female presented with confusion and aphasia after being found down on 05/14/2019 with stroke work-up revealing to frontoparietal cortical infarcts due to hypoperfusion in setting of dehydration and AKI with left ICA and MCA severe stenosis. Vascular risk factors include HTN, HLD, DM, intracranial stenosis and right ICA 5 mm aneurysm.       1. Left frontal parietal stroke:  -Recovered well without residual deficits.   -Continue clopidogrel 75 mg daily  and increase pravastatin to 40 mg daily for secondary stroke prevention.  -Ensure close PCP follow-up for aggressive stroke risk factor management 2. HTN:  -BP goal <130/90. Continue to follow with PCP for monitoring 3. HLD:  -LDL goal <70.  Continue pravastatin 40 mg daily; f/u with PCP for monitoring of lipid panel and prescribing of statin 4. DMII:  -Stable.  Continue to follow with PCP for  monitoring management 5. Intracranial stenosis:  -continue Plavix and pravastatin 40 mg daily.  Discussion regarding importance of managing stroke risk factors.   6. Right ICA aneurysm:  -Repeat MRA head for surveillance monitoring.  Not recommended for CTA due to contrast dye and elevated creatinine with history of AKI    Per patient request, follow-up on an as-needed basis  I spent 25 minutes of face-to-face and non-face-to-face time with patient and son.  This included previsit chart review, lab review, study review, order entry, electronic health record documentation, patient education regarding recent stroke, importance of managing stroke risk factors and answered all questions to patient satisfaction     Frann Rider, [facility]  [facility] [address] [place], [place] [address]  Phone [phone] Fax [phone] Note: This document was prepared with digital dictation and possible smart phrase technology. Any transcriptional errors that result from this process are unintentional.

## 2019-10-23 NOTE — Telephone Encounter (Signed)
[employer] medicare order sent to GI. No auth they will reach out to the patient to schedule.  

## 2019-10-23 NOTE — Patient Instructions (Signed)
Repeat MRA head for surveillance monitoring of your aneurysm -you will be called to schedule imaging appointment  Continue clopidogrel 75 mg daily  and pravastatin for secondary stroke prevention  Continue to follow up with PCP regarding cholesterol, blood pressure and diabetes management  Maintain strict control of hypertension with blood pressure goal below 130/90, diabetes with hemoglobin A1c goal below 6.5% and cholesterol with LDL cholesterol (bad cholesterol) goal below 70 mg/dL.    Follow-up on an as-needed basis       Thank you for coming to see [place] at [facility]. I hope we have been able to provide you high quality care today.  You may receive a patient satisfaction survey over the next few weeks. We would appreciate your feedback and comments so that we may continue to improve ourselves and the health of our patients.

## 2019-11-04 ENCOUNTER — Encounter: Payer: Self-pay | Admitting: Gynecologic Oncology

## 2019-11-04 ENCOUNTER — Other Ambulatory Visit: Payer: Self-pay

## 2019-11-04 ENCOUNTER — Inpatient Hospital Stay: Payer: Medicare Other | Attending: Gynecologic Oncology | Admitting: Gynecologic Oncology

## 2019-11-04 ENCOUNTER — Ambulatory Visit (HOSPITAL_COMMUNITY)
Admission: RE | Admit: 2019-11-04 | Discharge: 2019-11-04 | Disposition: A | Discharge status: Home / Self Care | Payer: Medicare Other | Source: Ambulatory Visit | Attending: Gynecologic Oncology | Admitting: Gynecologic Oncology

## 2019-11-04 VITALS — BP 182/79 | HR 73 | Temp 98.4°F | Resp 16 | Ht 62.0 in | Wt 133.8 lb

## 2019-11-04 DIAGNOSIS — E119 Type 2 diabetes mellitus without complications: Secondary | ICD-10-CM | POA: Diagnosis not present

## 2019-11-04 DIAGNOSIS — Z79899 Other long term (current) drug therapy: Secondary | ICD-10-CM | POA: Insufficient documentation

## 2019-11-04 DIAGNOSIS — R9389 Abnormal findings on diagnostic imaging of other specified body structures: Secondary | ICD-10-CM | POA: Insufficient documentation

## 2019-11-04 DIAGNOSIS — Z7984 Long term (current) use of oral hypoglycemic drugs: Secondary | ICD-10-CM | POA: Diagnosis not present

## 2019-11-04 DIAGNOSIS — Z780 Asymptomatic menopausal state: Secondary | ICD-10-CM | POA: Diagnosis not present

## 2019-11-04 DIAGNOSIS — I10 Essential (primary) hypertension: Secondary | ICD-10-CM | POA: Diagnosis not present

## 2019-11-04 DIAGNOSIS — E785 Hyperlipidemia, unspecified: Secondary | ICD-10-CM | POA: Diagnosis not present

## 2019-11-04 DIAGNOSIS — M810 Age-related osteoporosis without current pathological fracture: Secondary | ICD-10-CM | POA: Diagnosis not present

## 2019-11-04 NOTE — Patient Instructions (Signed)
I will call you with the results of your ultrasound. As long as there is no significant abnormality, I will plan to see you only as needed in the future (if you develop vaginal bleeding, discharge, pelvic pain, etc).

## 2019-11-04 NOTE — Progress Notes (Signed)
Gynecologic Oncology Return Clinic Visit  11/04/19  Reason for Visit: follow-up in the setting of thickened endometrial lining, repeat ultrasound  Treatment History: The patient had an incidental finding of a possible right ovarian mass on CT scan for nausea and emesis, ultimately being diagnosed with colitis. For follow-up, she underwent pelvic ultrasound in mid April which showed fluid in the endometrial canal and a lining measuring 5 mm. Ovaries not visualized.  On 5/11, endometrial biopsy was attempted with cervical stenosis noted. Attempt to open the canal with a scalpel and os finder was performed, but it was not possible to access the endometrial cavity. Cervix noted to be atrophic and friable.  5/18: biopsy of the cervix performed. Pathology revealed squamous mucosa with necrosis, exudate and atypia.   Interval History: Patient reports doing well since her last visit with me.  She denies any vaginal bleeding or discharge.  She reports a good appetite without any nausea or emesis.  She continues to have intermittent constipation for which she takes as a laxative about once a week.  She denies any issues with urination.  She denies any abdominal or pelvic pain.  Past Medical/Surgical History: Past Medical History:  Diagnosis Date  . Blind hypertensive eye, left 04/18/2012  . Changing skin lesion 11/23/2018  . CVA (cerebral vascular accident) ([place])   . Gait disturbance 10/05/2015   pt declined neuro referral per records  . Generalized weakness 05/15/2019  . Hard of hearing 12/02/2015   hearing aids  . Hyperlipidemia   . Hypertension   . Insomnia    "tried melatonin, advil PM, xanax and klonopin without success"  . Leukocytosis 11/07/2013   seen oncology, did not want to continue to follow there, so continued follow up at PCP.  . Osteoporosis   . Polyneuropathy   . Primary open angle glaucoma 01/2011  . Spinal stenosis   . Stable branch retinal vein occlusion 01/06/2012  . Type  2 diabetes mellitus ([place])    "diet controlled"    Past Surgical History:  Procedure Laterality Date  . BUBBLE STUDY  05/17/2019   Procedure: BUBBLE STUDY;  Surgeon: Lelon Perla, MD;  Location: [facility] ENDOSCOPY;  Service: Cardiovascular;;  . EYE SURGERY    . [place]  2005  . TEE WITHOUT CARDIOVERSION N/A 05/17/2019   Procedure: TRANSESOPHAGEAL ECHOCARDIOGRAM (TEE);  Surgeon: Lelon Perla, MD;  [facility] ENDOSCOPY;  Service: Cardiovascular;  Laterality: N/A;  . TOTAL HIP ARTHROPLASTY Left 1991  . TOTAL HIP ARTHROPLASTY Right ?   x 2    Family History  Problem Relation Age of Onset  . Heart disease Brother   . Cancer Other        prostate  . Ovarian cancer Neg Hx   . Breast cancer Neg Hx   . Uterine cancer Neg Hx   . Colon cancer Neg Hx     Social History   Socioeconomic History  . Marital status: Divorced    Spouse name: Not on file  . Number of children: 3  . Years of education: 84  . Highest education level: Not on file  Occupational History  . Not on file  Tobacco Use  . Smoking status: Never Smoker  . Smokeless tobacco: Never Used  Vaping Use  . Vaping Use: Never used  Substance and Sexual Activity  . Alcohol use: No  . Drug use: No  . Sexual activity: Never    Birth control/protection: Post-menopausal  Other Topics Concern  . Not on file  Social History Narrative   Divorced. 3 children Madison Martinez, Madison Martinez and Madison Martinez. Lives alone.   High school graduate.   Takes a daily vitamin.   Wears her seatbelt. Wears a hearing aid. Wears dentures.   Requires a cane for assistive device for walking.   Smoke detector in the home.   Feels safe in her relationships.   Social Determinants of Health   Financial Resource Strain:   . Difficulty of Paying Living Expenses:   Food Insecurity:   . Worried About Charity fundraiser in the Last Year:   . Arboriculturist in the Last Year:   Transportation Needs:   . Film/video editor (Medical):   Marland Kitchen Lack of  Transportation (Non-Medical):   Physical Activity:   . Days of Exercise per Week:   . Minutes of Exercise per Session:   Stress:   . Feeling of Stress :   Social Connections:   . Frequency of Communication with Friends and Family:   . Frequency of Social Gatherings with Friends and Family:   . Attends Religious Services:   . Active Member of Clubs or Organizations:   . Attends Archivist Meetings:   Marland Kitchen Marital Status:     Current Medications:  Current Outpatient Medications:  .  Ca Carb-Glucos-Chond-Phelloden (FLEXI JOINT RELIEF PLUS PO), Take 2 capsules by mouth daily., Disp: , Rfl:  .  Cholecalciferol (VITAMIN D-3) 25 MCG (1000 UT) CAPS, Take 1,000 Units by mouth daily., Disp: , Rfl:  .  clopidogrel (PLAVIX) 75 MG tablet, Take 1 tablet (75 mg total) by mouth daily., Disp: 90 tablet, Rfl: 3 .  dorzolamide-timolol (COSOPT) 22.3-6.8 MG/ML ophthalmic solution, Place 1 drop into the right eye 2 (two) times daily., Disp: , Rfl:  .  glipiZIDE (GLUCOTROL XL) 2.5 MG 24 hr tablet, 1 tab in the morning with breakfast on Monday, Wednesday and Friday only., Disp: 40 tablet, Rfl: 1 .  hydrALAZINE (APRESOLINE) 25 MG tablet, TAKE 1 TABLET BY MOUTH  EVERY 8 HOURS. (Patient taking differently: Take 25 mg by mouth every 8 (eight) hours. Marland Kitchen), Disp: 270 tablet, Rfl: 3 .  KRILL OIL PO, Take 1 capsule by mouth daily. , Disp: , Rfl:  .  lisinopril (ZESTRIL) 40 MG tablet, Take 1 tablet (40 mg total) by mouth daily., Disp: 90 tablet, Rfl: 1 .  Melatonin 5 MG TABS, Take 5 mg by mouth at bedtime as needed (for sleep)., Disp: , Rfl:  .  metoprolol succinate (TOPROL-XL) 25 MG 24 hr tablet, Take 1 tablet (25 mg total) by mouth daily., Disp: 90 tablet, Rfl: 1 .  Multiple Vitamins-Minerals (ONE-A-[place] WOMENS 50 PLUS) TABS, Take 1 tablet by mouth daily., Disp: , Rfl:  .  NON FORMULARY, Take 2 capsules by mouth See admin instructions. UriVArx - Clinically Proven Bladder Control Capsules: Take 2 capsules by mouth  once a [place], Disp: , Rfl:  .  Omega-3 300 MG CAPS, Take 1 tablet by mouth daily., Disp: , Rfl:  .  polyethylene glycol powder (GLYCOLAX/MIRALAX) 17 GM/SCOOP powder, Take 17 g by mouth daily as needed for mild constipation., Disp: , Rfl:  .  pravastatin (PRAVACHOL) 40 MG tablet, Take 1 tablet (40 mg total) by mouth daily., Disp: 90 tablet, Rfl: 3 .  VYZULTA 0.024 % SOLN, Place 1 drop into the left eye at bedtime. , Disp: , Rfl:   Review of Systems: Denies appetite changes, fevers, chills, fatigue, unexplained weight changes. Denies hearing loss, neck lumps or masses, mouth  sores, ringing in ears or voice changes. Denies cough or wheezing.  Denies shortness of breath. Denies chest pain or palpitations. Denies leg swelling. Denies abdominal distention, pain, blood in stools, constipation, diarrhea, nausea, vomiting, or early satiety. Denies pain with intercourse, dysuria, frequency, hematuria or incontinence. Denies hot flashes, pelvic pain, vaginal bleeding or vaginal discharge.   Denies joint pain, back pain or muscle pain/cramps. Denies itching, rash, or wounds. Denies dizziness, headaches, numbness or seizures. Denies swollen lymph nodes or glands, denies easy bruising or bleeding. Denies anxiety, depression, confusion, or decreased concentration.  Physical Exam: BP (!) 182/79 (BP Location: Right Arm, Patient Position: Sitting)   Pulse 73   Temp 98.4 F (36.9 C) (Tympanic)   Resp 16   Ht 5\' 2"  (1.575 m)   Wt 133 lb 12.8 oz (60.7 kg)   LMP 03/21/1978 (Approximate)   SpO2 98%   BMI 24.47 kg/m  General: Alert, oriented, no acute distress. HEENT: Normocephalic, atraumatic, sclera anicteric. Chest: Unlabored breathing on room air.  Laboratory & Radiologic Studies: Pelvic ultrasound today: Formal read pending.  Upon my review, exam relatively similar to her last ultrasound.  There still appears to be some fluid in the endometrial cavity.  Lining itself appears then.  Assessment &  Plan: Madison Martinez is a 86 y.o. woman with an incidentally found adnexal mass on CT imaging noted to have fluid in her endometrial lining and a mildly thickened lining on follow-up ultrasound.  Patient presents today for follow-up.  She is just undergone repeat pelvic ultrasound.  She has been asymptomatic since her last visit.  I reviewed my read of the ultrasound with them and I will call the patient's son when the formal read is back.  Overall, I do not think there has been a significant change.  Given the fact she is still asymptomatic, I feel comfortable with follow-up on an as-needed basis.  Even if the patient had precancer or an early cancer, given her age and comorbidities, we discussed that she is unlikely to want to pursue aggressive therapy.  Both the patient as well as her son were understanding of this and are comfortable with the plan for no scheduled follow-up.  22 minutes of total time was spent for this patient encounter, including preparation, face-to-face counseling with the patient and coordination of care, and documentation of the encounter.  Jeral Pinch, MD  Division of Gynecologic Oncology  Department of Obstetrics and Gynecology  [facility]

## 2019-11-05 ENCOUNTER — Ambulatory Visit: Payer: Medicare Other | Admitting: Gynecologic Oncology

## 2019-11-05 ENCOUNTER — Telehealth: Payer: Self-pay | Admitting: Gynecologic Oncology

## 2019-11-05 ENCOUNTER — Ambulatory Visit (HOSPITAL_COMMUNITY): Payer: Medicare Other

## 2019-11-05 NOTE — Telephone Encounter (Signed)
Called patient's son to discuss official ultrasound results from yesterday. No answer. Callback number left with call requested.  Jeral Pinch MD Gynecologic Oncology

## 2019-11-08 ENCOUNTER — Telehealth: Payer: Self-pay | Admitting: Gynecologic Oncology

## 2019-11-08 ENCOUNTER — Other Ambulatory Visit: Payer: Self-pay | Admitting: Gynecologic Oncology

## 2019-11-08 DIAGNOSIS — R9389 Abnormal findings on diagnostic imaging of other specified body structures: Secondary | ICD-10-CM

## 2019-11-08 DIAGNOSIS — N889 Noninflammatory disorder of cervix uteri, unspecified: Secondary | ICD-10-CM

## 2019-11-08 NOTE — Progress Notes (Signed)
Follow up US ordered per Dr. Berline Lopes for Feb 2022.

## 2019-11-08 NOTE — Telephone Encounter (Signed)
Called the Max, the patient's son, again to discuss ultrasound findings. WE reviewed continued fluid in the endometrial canal and an ill defined mass at the superior aspect of the endometrium. I offered multiple different options, all of which I think are reasonable given the patient's age, comorbidities. I offered no further intervention, imaging surviellance (either with a pelvic MRI now to hopefully better define this uterine mass vs. A repeat pelvic ultrasound in 3-6 months) or definitive intervention (outpatient d&c, hysteroscopy with biopsies under direct visualization. Mr. Lilia Pro has already spoken with his mother about her desires and they would like to go ahead and schedule a repeat pelvic ultrasound in 6 months. I asked that he call if she develops VB or other symptoms before that time. All questions answered.  Jeral Pinch MD Gynecologic Oncology

## 2019-11-19 ENCOUNTER — Telehealth: Payer: Self-pay

## 2019-11-19 NOTE — Telephone Encounter (Signed)
Left vm for Madison Martinez to call the office so we can give him patients appt for [place].  Date is 04/21/2020, arrive by 1:45 with a full bladder, appt at 2 pm at [facility].

## 2019-11-20 ENCOUNTER — Telehealth: Payer: Self-pay | Admitting: *Deleted

## 2019-11-20 ENCOUNTER — Other Ambulatory Visit: Payer: Self-pay

## 2019-11-20 ENCOUNTER — Ambulatory Visit
Admission: RE | Admit: 2019-11-20 | Discharge: 2019-11-20 | Disposition: A | Discharge status: Home / Self Care | Payer: Medicare Other | Source: Ambulatory Visit | Attending: Adult Health | Admitting: Adult Health

## 2019-11-20 DIAGNOSIS — I671 Cerebral aneurysm, nonruptured: Secondary | ICD-10-CM | POA: Diagnosis not present

## 2019-11-20 NOTE — Telephone Encounter (Signed)
Patient's son called and was given the appt for the [place]

## 2019-11-26 ENCOUNTER — Telehealth: Payer: Self-pay

## 2019-11-26 NOTE — Telephone Encounter (Signed)
Pt's son Max Lilia Pro was notified of the message below

## 2019-11-26 NOTE — Telephone Encounter (Signed)
-----   Message from Frann Rider, NP sent at 11/21/2019  5:01 PM EDT ----- Please advise patient that recent imaging showed stable appearance of known aneurysm and arthrosclerosis

## 2019-12-10 ENCOUNTER — Ambulatory Visit: Payer: Medicare Other | Admitting: Family Medicine

## 2020-01-01 ENCOUNTER — Other Ambulatory Visit: Payer: Self-pay | Admitting: Family Medicine

## 2020-01-01 DIAGNOSIS — I10 Essential (primary) hypertension: Secondary | ICD-10-CM

## 2020-02-26 ENCOUNTER — Encounter: Payer: Self-pay | Admitting: Family Medicine

## 2020-02-26 ENCOUNTER — Ambulatory Visit (INDEPENDENT_AMBULATORY_CARE_PROVIDER_SITE_OTHER): Payer: Medicare Other | Admitting: Family Medicine

## 2020-02-26 ENCOUNTER — Other Ambulatory Visit: Payer: Self-pay

## 2020-02-26 VITALS — BP 151/71 | HR 71 | Temp 98.0°F | Ht 64.0 in | Wt 136.0 lb

## 2020-02-26 DIAGNOSIS — G319 Degenerative disease of nervous system, unspecified: Secondary | ICD-10-CM

## 2020-02-26 DIAGNOSIS — Z7901 Long term (current) use of anticoagulants: Secondary | ICD-10-CM

## 2020-02-26 DIAGNOSIS — I739 Peripheral vascular disease, unspecified: Secondary | ICD-10-CM

## 2020-02-26 DIAGNOSIS — I700 Atherosclerosis of aorta: Secondary | ICD-10-CM

## 2020-02-26 DIAGNOSIS — I63512 Cerebral infarction due to unspecified occlusion or stenosis of left middle cerebral artery: Secondary | ICD-10-CM

## 2020-02-26 DIAGNOSIS — E7800 Pure hypercholesterolemia, unspecified: Secondary | ICD-10-CM

## 2020-02-26 DIAGNOSIS — I359 Nonrheumatic aortic valve disorder, unspecified: Secondary | ICD-10-CM

## 2020-02-26 DIAGNOSIS — N183 Chronic kidney disease, stage 3 unspecified: Secondary | ICD-10-CM

## 2020-02-26 DIAGNOSIS — I10 Essential (primary) hypertension: Secondary | ICD-10-CM | POA: Diagnosis not present

## 2020-02-26 DIAGNOSIS — D72820 Lymphocytosis (symptomatic): Secondary | ICD-10-CM | POA: Diagnosis not present

## 2020-02-26 DIAGNOSIS — E119 Type 2 diabetes mellitus without complications: Secondary | ICD-10-CM | POA: Diagnosis not present

## 2020-02-26 LAB — POCT GLYCOSYLATED HEMOGLOBIN (HGB A1C)
HbA1c POC (<> result, manual entry): 5.2 % (ref 4.0–5.6)
HbA1c, POC (controlled diabetic range): 5.2 % (ref 0.0–7.0)
HbA1c, POC (prediabetic range): 5.2 % — AB (ref 5.7–6.4)
Hemoglobin A1C: 5.2 % (ref 4.0–5.6)

## 2020-02-26 MED ORDER — METOPROLOL SUCCINATE ER 25 MG PO TB24: 25.0000 mg | ORAL_TABLET | Freq: Every day | ORAL | 1 refills | Status: DC

## 2020-02-26 MED ORDER — LISINOPRIL 40 MG PO TABS: 40.0000 mg | ORAL_TABLET | Freq: Every day | ORAL | 1 refills | Status: DC

## 2020-02-26 MED ORDER — CLOPIDOGREL BISULFATE 75 MG PO TABS: 75.0000 mg | ORAL_TABLET | Freq: Every day | ORAL | 3 refills | Status: DC

## 2020-02-26 NOTE — Patient Instructions (Addendum)
Great to see you today.  I have refilled your medications and we will call you with lab results.   Stop the glipizide all together. Your a1c is great at 5.2  Bring your blood pressure cuff with ou next time.   Next appt 5.5 mos.    Happy holidays !!!

## 2020-02-26 NOTE — Progress Notes (Signed)
Madison Martinez , 1927-08-12, 86 y.o., female MRN: [id number] Patient Care Team    Relationship Specialty Notifications Start End  Ma Hillock, DO PCP - General Family Medicine  09/01/16   Verdell Carmine, MD Referring Physician Internal Medicine  09/01/16   Renelda Loma, OD  Optometry  09/01/16   [facility], [facility]  Ophthalmology  06/16/17   Manuela Neptune, DPM Referring Physician Podiatry  06/16/17     Chief Complaint  Patient presents with  . Follow-up    [facility];      Subjective:  Nekeya M Talley  is a 86 y.o. female presents for Essential hypertension/hyperlipidemia/CKD3/CVA/aortic atherosclerosis/chronic anticoagulation/CVA Pt reports compliance with Metoprolol-XL 25 mg daily, hydralazine 25 mg TID  and lisinopril 40 mg daily.Patient denies chest pain, shortness of breath, dizziness or lower extremity edema.  She is taking statin and has added Krill oil.  She reports compliance with Plavix and 325 mg ASA.  She has followed with neurology status post stroke. Diet: Monitors her diet, low-sodium Exercise: Does not exercise much, walks with a cane RF: Hypertension, hyperlipidemia, family history of heart disease  Type 2 diabetes mellitus without complication, without long-term current use of insulin (HCC) Pt reports compliance with glipizide 2.5 mg daily, tolerating well. Patient denies dizziness, hyperglycemic or hypoglycemic events. Patient denies numbness, tingling in the extremities or nonhealing wounds of feet.  PNA series: She has declined Pneumovax Flu shot: She declines flu shot (recommneded yearly) A1c: 6.6> 5.5 >5.2> 5.2 again today  Monoclonal B-cell lymphocytosis She is suppose to follow with her heme/onc every 3 months- but has not in some time. CBC has been stable here and she would like to continue following cbc here.  She is due for labs.  Elevated TSH: Last TSH 06/2019 just mildly elevated TSH.  Like to not to start medication at this time.  We will continue  to monitor  EEG-05/15/2019 IMPRESSION: This study is suggestive of non specific cortical dysfunction in left frontotemporal region. No seizures or definite epileptiform discharges were seen throughout the recording. [place]   MR ANGIO HEAD WO CONTRAST- 05/15/2019 IMPRESSION: 1. Two punctate subcentimeter acute ischemic nonhemorrhagic cortical infarcts involving the left frontoparietal region as above, likely embolic in nature. 2. Underlying moderately advanced cerebral atrophy with chronic small vessel ischemic disease.  MR BRAIN WO CONTRAST- 05/15/2019 IMPRESSION: 1. Approximate 8 mm severe stenosis extending from the left carotid terminus into the left M1 segment. Left MCA branches are perfused distally. 2. 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 Short-segment approximate 60% right V4 stenosis as above. 3. Extensive atheromatous change throughout the carotid siphons with associated moderate to advanced multifocal stenoses. 4. 5 mm cavernous right ICA aneurysm .  US RENAL-05/15/2019 Right Kidney: Renal measurements: 8.6 x 5.3 x 3.1 cm = volume: 73.8 mL . Echogenicity within normal limits. No worrisome mass, shadowing calculus or hydronephrosis visualized.  Left Kidney: Renal measurements: 9.4 x 5.3 x 3.6 cm = volume: 94 mL. 0.8 x 0.6 x 0.6 cm anechoic cystic lesion in the interpolar left kidney most compatible with simple renal cyst corresponding well to a partially exophytic cyst on CT. No worrisome renal lesion. No shadowing calculus or hydronephrosis.  Bladder: Appears normal for degree of bladder distention.  IMPRESSION: Anechoic 8 mm likely simple cyst in the left kidney. Otherwise unremarkable renal ultrasound.    ECHOCARDIOGRAM COMPLETE-05/15/2019 IMPRESSIONS  1. There is a mass (0.8 cm x  0.5 cm) that appears to be attached to the right coronary cusp of the aortic valve. It does appear calcified and is best seen in the PLAX views. There is  no destruction of the valve to suggest vegetation/endocarditis. 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 A TEE is recommended for better characterization in this patient with stroke.          ECHO TEE- 05/17/2019 IMPRESSIONS   Normal LV function; moderate LAE; trace AI; mild MR and TR.   Left ventricular ejection fraction, by estimation, is 55 to 60%. The left ventricle has normal function. The left ventricle has no regional wall motion abnormalities.   Right ventricular systolic function is normal. The right ventricular size is normal. Aortic Valve: The aortic valve is tricuspid. Aortic valve regurgitation is trivial. Mild to moderate aortic valve sclerosis/calcification is present, without any evidence of aortic stenosis. Aorta: The aortic root is normal in size and structure. There is moderate (Grade III) plaque involving the descending aorta.  05/20/2019-Carotid Arterial Duplex Study  Summary: Right Carotid: Velocities in the right ICA are consistent with a 1-39% stenosis. Left Carotid: Velocities in the left ICA are consistent with a 1-39% stenosis. Vertebrals: Bilateral vertebral arteries demonstrate antegrade flow.   Depression screen [facility] 2/9 02/26/2020 07/10/2019 08/03/2018 08/03/2018 05/25/2018  Decreased Interest 0 0 0 0 0  Down, Depressed, Hopeless 0 0 0 0 0  PHQ - 2 Score 0 0 0 0 0    Allergies  Allergen Reactions  . Amlodipine Besylate Other (See Comments)    Vertigo  . Flu Virus Vaccine Swelling    Site of swelling not known by son   Social History   Tobacco Use  . Smoking status: Never Smoker  . Smokeless tobacco: Never Used  Substance Use Topics  . Alcohol use: No   Past Medical History:  Diagnosis Date  . Blind hypertensive eye, left 04/18/2012  . Changing skin lesion 11/23/2018  . CVA (cerebral vascular accident) ([place])   . Gait disturbance 10/05/2015   pt declined neuro referral per records  . Generalized weakness 05/15/2019  . Hard of  hearing 12/02/2015   hearing aids  . Hyperlipidemia   . Hypertension   . Insomnia    "tried melatonin, advil PM, xanax and klonopin without success"  . Leukocytosis 11/07/2013   seen oncology, did not want to continue to follow there, so continued follow up at PCP.  . Osteoporosis   . Polyneuropathy   . Primary open angle glaucoma 01/2011  . Spinal stenosis   . Stable branch retinal vein occlusion 01/06/2012  . Type 2 diabetes mellitus ([place])    "diet controlled"   Past Surgical History:  Procedure Laterality Date  . BUBBLE STUDY  05/17/2019   Procedure: BUBBLE STUDY;  Surgeon: Lelon Perla, MD;  Location: [facility] ENDOSCOPY;  Service: Cardiovascular;;  . EYE SURGERY    . [place]  2005  . TEE WITHOUT CARDIOVERSION N/A 05/17/2019   Procedure: TRANSESOPHAGEAL ECHOCARDIOGRAM (TEE);  Surgeon: Lelon Perla, MD;  Location: [facility] ENDOSCOPY;  Service: Cardiovascular;  Laterality: N/A;  . TOTAL HIP ARTHROPLASTY Left 1991  . TOTAL HIP ARTHROPLASTY Right ?   x 2   Family History  Problem Relation Age of Onset  . Heart disease Brother   . Cancer Other        prostate  . Ovarian cancer Neg Hx   . Breast cancer Neg Hx   . Uterine cancer Neg Hx   .  Colon cancer Neg Hx    Allergies as of 02/26/2020      Reactions   Amlodipine Besylate Other (See Comments)   Vertigo   Flu Virus Vaccine Swelling   Site of swelling not known by son      Medication List       Accurate as of February 26, 2020  2:28 PM. If you have any questions, ask your nurse or doctor.        STOP taking these medications   FLEXI JOINT RELIEF PLUS PO Stopped by: Howard Pouch, DO     TAKE these medications   clopidogrel 75 MG tablet Commonly known as: PLAVIX Take 1 tablet (75 mg total) by mouth daily.   dorzolamide-timolol 22.3-6.8 MG/ML ophthalmic solution Commonly known as: COSOPT Place 1 drop into the right eye 2 (two) times daily.   glipiZIDE 2.5 MG 24 hr tablet Commonly known as: GLUCOTROL XL 1  tab in the morning with breakfast on Monday, Wednesday and Friday only.   hydrALAZINE 25 MG tablet Commonly known as: APRESOLINE TAKE 1 TABLET BY MOUTH  EVERY 8 HOURS   KRILL OIL PO Take 1 capsule by mouth daily.   lisinopril 40 MG tablet Commonly known as: ZESTRIL Take 1 tablet (40 mg total) by mouth daily.   melatonin 5 MG Tabs Take 5 mg by mouth at bedtime as needed (for sleep).   metoprolol succinate 25 MG 24 hr tablet Commonly known as: TOPROL-XL Take 1 tablet (25 mg total) by mouth daily.   NON FORMULARY Take 2 capsules by mouth See admin instructions. UriVArx - Clinically Proven Bladder Control Capsules: Take 2 capsules by mouth once a [place]   Omega-3 300 MG Caps Take 1 tablet by mouth daily.   One-A-Blann Womens 50 Plus Tabs Take 1 tablet by mouth daily.   polyethylene glycol powder 17 GM/SCOOP powder Commonly known as: GLYCOLAX/MIRALAX Take 17 g by mouth daily as needed for mild constipation.   pravastatin 40 MG tablet Commonly known as: PRAVACHOL Take 1 tablet (40 mg total) by mouth daily.   Vitamin D-3 25 MCG (1000 UT) Caps Take 1,000 Units by mouth daily.   Vyzulta 0.024 % Soln Generic drug: Latanoprostene Bunod Place 1 drop into the left eye at bedtime.       All past medical history, surgical history, allergies, family history, immunizations and medications were updated in the EMR today and reviewed under the history and medication portions of their EMR.      ROS: Negative, with the exception of above mentioned in HPI  Objective:  BP (!) 151/71   Pulse 71   Temp 98 F (36.7 C) (Oral)   Ht 5\' 4"  (1.626 m)   Wt 136 lb (61.7 kg)   LMP 03/21/1978 (Approximate)   SpO2 98%   BMI 23.34 kg/m  Body mass index is 23.34 kg/m. Gen: Afebrile. No acute distress. Nontoxic pleasant female.  HENT: AT. [place].  Eyes:Pupils Equal Round Reactive to light, Extraocular movements intact,  Conjunctiva without redness, discharge or icterus. Neck/lymp/endocrine:  Supple,no lymphadenopathy, no thyromegaly CV: RRR no murmur, no edema, +2/4 P posterior tibialis pulses Chest: CTAB, no wheeze or crackles Skin: no rashes, purpura or petechiae.  Neuro:  Normal gait. PERLA. EOMi. Alert. Oriented x3 Psych: Normal affect, dress and demeanor. Normal speech. Normal thought content and judgment.   Assessment/Plan: Mackinze Criado Keepers is a 86 y.o. female present for OV for  Stage 3 chronic kidney disease Avoid NSAIDs Renally dose meds when appropriate  Pth/ca and vit d collected today cmp collected today Follow-up every 6 months  Monoclonal B-cell lymphocytosis: Patient has declined follow-up with heme-onc. Monitor CBC twice yearly Cbc collected today  Cerebrovascular accident (CVA) due to stenosis of left middle cerebral artery (HCC)/Aneurysm of internal carotid artery/small vessel disease/cerebral atrophy/abnormal EEG/chronic anticoagulation Patient was counseled to take aspirin 325 mg daily along with Plavix 75 mg daily.   -Dual antiplatelet therapy timeline provided by neurology. - She is to continue Plavix indefinitely.  Essential hypertension/hyperlipidemia Overall stable. Her home pressures are normal. Encourgaed them to bring her home BP cuff with them next appt so we can check to make sure it is accurate.  Continue hydralazine 25 mg 3 times daily> she is ok with taking this every 8 hrs. -may consider clonidine patch in the future.  Continue lisinopril 40 Continue  metoprolol daily Continue  pravastatin 40 mg daily Follow-up every 6 months . Aortic valve mass/Aortic atherosclerosis (HCC)/aortic calcification -Referral to cardiology was placed for recommendations last visit  Elevated TSH: -has been very mildly  Elevated tsh. Elected to watch instead of starting med.  - tsh/t4 free collected today  Elevated glucose/diet controlled DM: On a very low dose of glipizide QOD- a1c 5.2 today> DC glip. Counseled on following a low sugar diet.    Reviewed  expectations re: course of current medical issues.  Discussed self-management of symptoms.  Outlined signs and symptoms indicating need for more acute intervention.  Patient verbalized understanding and all questions were answered.  Patient received an After-Visit Summary.  Any changes in medications were reviewed and patient was provided with updated med list with their AVS.      Orders Placed This Encounter  Procedures  . Basic Metabolic Panel (BMET)  . TSH  . T4, free  . PTH, Intact and Calcium  . Vitamin D (25 hydroxy)  . CBC w/Diff  . POCT HgB A1C   Meds ordered this encounter  Medications  . metoprolol succinate (TOPROL-XL) 25 MG 24 hr tablet    Sig: Take 1 tablet (25 mg total) by mouth daily.    Dispense:  90 tablet    Refill:  1  . lisinopril (ZESTRIL) 40 MG tablet    Sig: Take 1 tablet (40 mg total) by mouth daily.    Dispense:  90 tablet    Refill:  1  . clopidogrel (PLAVIX) 75 MG tablet    Sig: Take 1 tablet (75 mg total) by mouth daily.    Dispense:  90 tablet    Refill:  3   Referral Orders  No referral(s) requested today    Note is dictated utilizing voice recognition software. Although note has been proof read prior to signing, occasional typographical errors still can be missed. If any questions arise, please do not hesitate to call for verification.   electronically signed by:  Howard Pouch, DO  [place]

## 2020-02-27 LAB — BASIC METABOLIC PANEL
BUN/Creatinine Ratio: 22 (calc) (ref 6–22)
BUN: 20 mg/dL (ref 7–25)
CO2: 26 mmol/L (ref 20–32)
Calcium: 10.1 mg/dL (ref 8.6–10.4)
Chloride: 106 mmol/L (ref 98–110)
Creat: 0.91 mg/dL — ABNORMAL HIGH (ref 0.60–0.88)
Glucose, Bld: 101 mg/dL — ABNORMAL HIGH (ref 65–99)
Potassium: 4.2 mmol/L (ref 3.5–5.3)
Sodium: 140 mmol/L (ref 135–146)

## 2020-02-27 LAB — TSH: TSH: 4.73 mIU/L — ABNORMAL HIGH (ref 0.40–4.50)

## 2020-02-27 LAB — CBC WITH DIFFERENTIAL/PLATELET
Absolute Monocytes: 726 cells/uL (ref 200–950)
Basophils Absolute: 48 cells/uL (ref 0–200)
Basophils Relative: 0.4 %
Eosinophils Absolute: 133 cells/uL (ref 15–500)
Eosinophils Relative: 1.1 %
HCT: 36.4 % (ref 35.0–45.0)
Hemoglobin: 12.5 g/dL (ref 11.7–15.5)
Lymphs Abs: 4816 cells/uL — ABNORMAL HIGH (ref 850–3900)
MCH: 31.3 pg (ref 27.0–33.0)
MCHC: 34.3 g/dL (ref 32.0–36.0)
MCV: 91 fL (ref 80.0–100.0)
MPV: 11.7 fL (ref 7.5–12.5)
Monocytes Relative: 6 %
Neutro Abs: 6377 cells/uL (ref 1500–7800)
Neutrophils Relative %: 52.7 %
Platelets: 228 10*3/uL (ref 140–400)
RBC: 4 10*6/uL (ref 3.80–5.10)
RDW: 12.6 % (ref 11.0–15.0)
Total Lymphocyte: 39.8 %
WBC: 12.1 10*3/uL — ABNORMAL HIGH (ref 3.8–10.8)

## 2020-02-27 LAB — PTH, INTACT AND CALCIUM
Calcium: 10.1 mg/dL (ref 8.6–10.4)
PTH: 27 pg/mL (ref 14–64)

## 2020-02-27 LAB — VITAMIN D 25 HYDROXY (VIT D DEFICIENCY, FRACTURES): Vit D, 25-Hydroxy: 60 ng/mL (ref 30–100)

## 2020-02-27 LAB — T4, FREE: Free T4: 1 ng/dL (ref 0.8–1.8)

## 2020-03-13 ENCOUNTER — Other Ambulatory Visit: Payer: Self-pay | Admitting: Family Medicine

## 2020-04-01 DIAGNOSIS — H4052X3 Glaucoma secondary to other eye disorders, left eye, severe stage: Secondary | ICD-10-CM | POA: Diagnosis not present

## 2020-04-01 DIAGNOSIS — H401111 Primary open-angle glaucoma, right eye, mild stage: Secondary | ICD-10-CM | POA: Diagnosis not present

## 2020-04-15 DIAGNOSIS — M5416 Radiculopathy, lumbar region: Secondary | ICD-10-CM | POA: Diagnosis not present

## 2020-04-15 DIAGNOSIS — M4316 Spondylolisthesis, lumbar region: Secondary | ICD-10-CM | POA: Diagnosis not present

## 2020-04-21 ENCOUNTER — Ambulatory Visit (HOSPITAL_COMMUNITY): Payer: Medicare Other

## 2020-04-22 ENCOUNTER — Telehealth: Payer: Self-pay | Admitting: *Deleted

## 2020-04-22 NOTE — Telephone Encounter (Signed)
Called the patient's son to reschedule the [place] from 2/1. Per the patient's son, appt can be scheduled any [place] in the afternoon. Scheduled the [place] appt, before I could call the son with an appt date/time her called back. Patient's son stated "Listen I talked to mom and she absolutely refuses the scan. So where do we go from here?" Explained that I would give the message to Dr Berline Lopes and someone will call him back

## 2020-04-23 ENCOUNTER — Telehealth: Payer: Self-pay | Admitting: Gynecologic Oncology

## 2020-04-23 NOTE — Telephone Encounter (Signed)
Called patient's son back regarding ultrasound. Marque does not want to have repeat ultrasound performed. I spoke with Max about two options - one would be to get a pelvic MRI. The other would be to defer any further imaging unless something changes from a symptomatic standpoint. The patient continues to deny pain or vaginal bleeding. Max will talk with his mother about this choice. As of now, we will presume no additional imaging follow-up. He will call the clinica back to let us know if she would like to move forward with an MRI.  Jeral Pinch MD Gynecologic Oncology

## 2020-04-27 ENCOUNTER — Other Ambulatory Visit (HOSPITAL_COMMUNITY): Payer: Medicare Other

## 2020-04-28 DIAGNOSIS — M5416 Radiculopathy, lumbar region: Secondary | ICD-10-CM | POA: Diagnosis not present

## 2020-05-05 ENCOUNTER — Other Ambulatory Visit: Payer: Self-pay | Admitting: Adult Health

## 2020-05-05 DIAGNOSIS — E785 Hyperlipidemia, unspecified: Secondary | ICD-10-CM

## 2020-05-06 ENCOUNTER — Other Ambulatory Visit: Payer: Self-pay | Admitting: Adult Health

## 2020-05-06 DIAGNOSIS — E785 Hyperlipidemia, unspecified: Secondary | ICD-10-CM

## 2020-05-07 ENCOUNTER — Other Ambulatory Visit: Payer: Self-pay | Admitting: Adult Health

## 2020-05-07 DIAGNOSIS — E785 Hyperlipidemia, unspecified: Secondary | ICD-10-CM

## 2020-05-08 ENCOUNTER — Telehealth: Payer: Self-pay | Admitting: Family Medicine

## 2020-05-08 NOTE — Telephone Encounter (Signed)
Spoke with Madison Martinez and informed him that he will need to contact the neurologist office for a refill and most likely need an appt.

## 2020-05-08 NOTE — Telephone Encounter (Signed)
Patient's son called to request refill of pravastatin. Please send to same [facility] mail order pharmacy.

## 2020-05-09 ENCOUNTER — Other Ambulatory Visit: Payer: Self-pay | Admitting: Adult Health

## 2020-05-09 DIAGNOSIS — E785 Hyperlipidemia, unspecified: Secondary | ICD-10-CM

## 2020-05-11 ENCOUNTER — Telehealth: Payer: Self-pay | Admitting: Adult Health

## 2020-05-11 DIAGNOSIS — E785 Hyperlipidemia, unspecified: Secondary | ICD-10-CM

## 2020-05-11 NOTE — Telephone Encounter (Signed)
Pt's son, Sharion Dove request refill pravastatin (PRAVACHOL) 40 MG tablet at [facility]

## 2020-05-11 NOTE — Telephone Encounter (Signed)
I spoke with Pt son regarding a Rx request he put in for her Pravastatin. I informed him that Janett Billow advised them to Continue pravastatin 40 mg daily; f/u with PCP for monitoring of lipid panel and prescribing of statin, at her last visit on 10/23/19. He stated he spoke to her PCP and they said he needs to get this refill from whoever prescribed it.

## 2020-05-12 MED ORDER — PRAVASTATIN SODIUM 40 MG PO TABS: 40.0000 mg | ORAL_TABLET | Freq: Every day | ORAL | 3 refills | Status: DC

## 2020-05-12 NOTE — Telephone Encounter (Signed)
Refilled pravastatin for her. Please make pt aware.

## 2020-05-12 NOTE — Telephone Encounter (Signed)
I reached back out to the patient son and informed him that Mrs. Haughey has since been discharged from our office as she recovered well from a stroke standpoint and was advised to follow closely with PCP to continue monitoring and management of stroke risk factors. Continued prescribing of pravastatin should be able to be completed by PCP. This will be forwarded to PCP Dr. Howard Pouch for further review. I informed him that Dr. Howard Pouch refilled this pravastatin for her and to please follow up with her for further management and patients son understood.

## 2020-05-12 NOTE — Telephone Encounter (Signed)
Madison Martinez has since been discharged from our office as she recovered well from a stroke standpoint and follow closely with PCP to continue monitoring and management of stroke risk factors.  She was on pravastatin 10 mg daily (rx'd by PCP) prior to her stroke but dosage increased to 40 mg daily due to elevated LDL.  Continued prescribing of pravastatin should be able to be completed by PCP. This will be forwarded to PCP Dr. Howard Pouch for further review.  Dr. Raoul Pitch, please let me know how if you have any questions or concerns regarding this request.  Thank you, Frann Rider, NP

## 2020-05-12 NOTE — Addendum Note (Signed)
Addended by: Howard Pouch A on: 05/12/2020 11:01 AM   Modules accepted: Orders

## 2020-05-12 NOTE — Telephone Encounter (Signed)
Spoke with pt's son aware we have spoke with neurologist and that rx has been sent.

## 2020-05-14 DIAGNOSIS — M961 Postlaminectomy syndrome, not elsewhere classified: Secondary | ICD-10-CM | POA: Diagnosis not present

## 2020-05-14 DIAGNOSIS — R2689 Other abnormalities of gait and mobility: Secondary | ICD-10-CM | POA: Diagnosis not present

## 2020-05-15 DIAGNOSIS — R2689 Other abnormalities of gait and mobility: Secondary | ICD-10-CM | POA: Diagnosis not present

## 2020-05-15 DIAGNOSIS — M4726 Other spondylosis with radiculopathy, lumbar region: Secondary | ICD-10-CM | POA: Diagnosis not present

## 2020-05-15 DIAGNOSIS — Z8673 Personal history of transient ischemic attack (TIA), and cerebral infarction without residual deficits: Secondary | ICD-10-CM | POA: Diagnosis not present

## 2020-05-15 DIAGNOSIS — M961 Postlaminectomy syndrome, not elsewhere classified: Secondary | ICD-10-CM | POA: Diagnosis not present

## 2020-05-15 DIAGNOSIS — I10 Essential (primary) hypertension: Secondary | ICD-10-CM | POA: Diagnosis not present

## 2020-05-19 DIAGNOSIS — M961 Postlaminectomy syndrome, not elsewhere classified: Secondary | ICD-10-CM | POA: Diagnosis not present

## 2020-05-19 DIAGNOSIS — Z8673 Personal history of transient ischemic attack (TIA), and cerebral infarction without residual deficits: Secondary | ICD-10-CM | POA: Diagnosis not present

## 2020-05-19 DIAGNOSIS — I10 Essential (primary) hypertension: Secondary | ICD-10-CM | POA: Diagnosis not present

## 2020-05-19 DIAGNOSIS — M4726 Other spondylosis with radiculopathy, lumbar region: Secondary | ICD-10-CM | POA: Diagnosis not present

## 2020-05-19 DIAGNOSIS — R2689 Other abnormalities of gait and mobility: Secondary | ICD-10-CM | POA: Diagnosis not present

## 2020-05-22 DIAGNOSIS — M4726 Other spondylosis with radiculopathy, lumbar region: Secondary | ICD-10-CM | POA: Diagnosis not present

## 2020-05-22 DIAGNOSIS — Z8673 Personal history of transient ischemic attack (TIA), and cerebral infarction without residual deficits: Secondary | ICD-10-CM | POA: Diagnosis not present

## 2020-05-22 DIAGNOSIS — R2689 Other abnormalities of gait and mobility: Secondary | ICD-10-CM | POA: Diagnosis not present

## 2020-05-22 DIAGNOSIS — M961 Postlaminectomy syndrome, not elsewhere classified: Secondary | ICD-10-CM | POA: Diagnosis not present

## 2020-05-22 DIAGNOSIS — I10 Essential (primary) hypertension: Secondary | ICD-10-CM | POA: Diagnosis not present

## 2020-05-25 DIAGNOSIS — R2689 Other abnormalities of gait and mobility: Secondary | ICD-10-CM | POA: Diagnosis not present

## 2020-05-25 DIAGNOSIS — I10 Essential (primary) hypertension: Secondary | ICD-10-CM | POA: Diagnosis not present

## 2020-05-25 DIAGNOSIS — M4726 Other spondylosis with radiculopathy, lumbar region: Secondary | ICD-10-CM | POA: Diagnosis not present

## 2020-05-25 DIAGNOSIS — Z8673 Personal history of transient ischemic attack (TIA), and cerebral infarction without residual deficits: Secondary | ICD-10-CM | POA: Diagnosis not present

## 2020-05-25 DIAGNOSIS — M961 Postlaminectomy syndrome, not elsewhere classified: Secondary | ICD-10-CM | POA: Diagnosis not present

## 2020-06-01 DIAGNOSIS — R2689 Other abnormalities of gait and mobility: Secondary | ICD-10-CM | POA: Diagnosis not present

## 2020-06-01 DIAGNOSIS — I10 Essential (primary) hypertension: Secondary | ICD-10-CM | POA: Diagnosis not present

## 2020-06-01 DIAGNOSIS — M961 Postlaminectomy syndrome, not elsewhere classified: Secondary | ICD-10-CM | POA: Diagnosis not present

## 2020-06-01 DIAGNOSIS — Z8673 Personal history of transient ischemic attack (TIA), and cerebral infarction without residual deficits: Secondary | ICD-10-CM | POA: Diagnosis not present

## 2020-06-01 DIAGNOSIS — M4726 Other spondylosis with radiculopathy, lumbar region: Secondary | ICD-10-CM | POA: Diagnosis not present

## 2020-06-09 DIAGNOSIS — M5416 Radiculopathy, lumbar region: Secondary | ICD-10-CM | POA: Diagnosis not present

## 2020-06-12 DIAGNOSIS — M4726 Other spondylosis with radiculopathy, lumbar region: Secondary | ICD-10-CM | POA: Diagnosis not present

## 2020-06-12 DIAGNOSIS — R2689 Other abnormalities of gait and mobility: Secondary | ICD-10-CM | POA: Diagnosis not present

## 2020-06-12 DIAGNOSIS — I10 Essential (primary) hypertension: Secondary | ICD-10-CM | POA: Diagnosis not present

## 2020-06-12 DIAGNOSIS — M961 Postlaminectomy syndrome, not elsewhere classified: Secondary | ICD-10-CM | POA: Diagnosis not present

## 2020-06-12 DIAGNOSIS — Z8673 Personal history of transient ischemic attack (TIA), and cerebral infarction without residual deficits: Secondary | ICD-10-CM | POA: Diagnosis not present

## 2020-06-16 ENCOUNTER — Other Ambulatory Visit: Payer: Self-pay | Admitting: Family Medicine

## 2020-06-16 DIAGNOSIS — I10 Essential (primary) hypertension: Secondary | ICD-10-CM

## 2020-07-12 ENCOUNTER — Other Ambulatory Visit: Payer: Self-pay | Admitting: Family Medicine

## 2020-07-12 DIAGNOSIS — I10 Essential (primary) hypertension: Secondary | ICD-10-CM

## 2020-08-05 ENCOUNTER — Ambulatory Visit: Payer: Medicare Other | Admitting: Family Medicine

## 2020-08-11 NOTE — Progress Notes (Signed)
Subjective:   Madison Martinez is a 87 y.o. female who presents for Medicare Annual (Subsequent) preventive examination.  I connected with [place] today by telephone and verified that I am speaking with the correct person using two identifiers. Location patient: home Location provider: work Persons participating in the virtual visit: patient,son, Marine scientist.    I discussed the limitations, risks, security and privacy concerns of performing an evaluation and management service by telephone and the availability of in person appointments. I also discussed with the patient that there may be a patient responsible charge related to this service. The patient expressed understanding and verbally consented to this telephonic visit.    Interactive audio and video telecommunications were attempted between this provider and patient, however failed, due to patient having technical difficulties OR patient did not have access to video capability.  We continued and completed visit with audio only.  Some vital signs may be absent or patient reported.   Time Spent with patient on telephone encounter: 20 minutes   Review of Systems     Cardiac Risk Factors include: advanced age (>52men, >62 women);dyslipidemia;hypertension     Objective:    Today's Vitals   08/12/20 1029  Weight: 136 lb (61.7 kg)  Height: 5\' 4"  (1.626 m)   Body mass index is 23.34 kg/m.  Advanced Directives 08/12/2020 11/04/2019 05/14/2019 08/03/2018 06/16/2017  Does Patient Have a Medical Advance Directive? Yes No No No Yes  Type of Paramedic of [place];Living will - - - [place] in Chart? No - copy requested - - - No - copy requested  Would patient like information on creating a medical advance directive? - No - Patient declined No - Patient declined - -    Current Medications (verified) Outpatient Encounter Medications as of 08/12/2020  Medication Sig  .  Cholecalciferol (VITAMIN D-3) 25 MCG (1000 UT) CAPS Take 1,000 Units by mouth daily.  . clopidogrel (PLAVIX) 75 MG tablet Take 1 tablet (75 mg total) by mouth daily.  . dorzolamide-timolol (COSOPT) 22.3-6.8 MG/ML ophthalmic solution Place 1 drop into the right eye 2 (two) times daily.  Marland Kitchen glipiZIDE (GLUCOTROL XL) 2.5 MG 24 hr tablet TAKE 1 TABLET BY MOUTH IN  THE MORNING WITH BREAKFAST  ON MONDAY WEDNESDAY AND  FRIDAY ONLY  . hydrALAZINE (APRESOLINE) 25 MG tablet TAKE 1 TABLET BY MOUTH  EVERY 8 HOURS  . KRILL OIL PO Take 1 capsule by mouth daily.   Marland Kitchen lisinopril (ZESTRIL) 40 MG tablet Take 1 tablet (40 mg total) by mouth daily.  . Melatonin 5 MG TABS Take 5 mg by mouth at bedtime as needed (for sleep).  . metoprolol succinate (TOPROL-XL) 25 MG 24 hr tablet Take 1 tablet (25 mg total) by mouth daily.  . Multiple Vitamins-Minerals (ONE-A-Brott WOMENS 50 PLUS) TABS Take 1 tablet by mouth daily.  . NON FORMULARY Take 2 capsules by mouth See admin instructions. UriVArx - Clinically Proven Bladder Control Capsules: Take 2 capsules by mouth once a [place]  . Omega-3 300 MG CAPS Take 1 tablet by mouth daily.  . polyethylene glycol powder (GLYCOLAX/MIRALAX) 17 GM/SCOOP powder Take 17 g by mouth daily as needed for mild constipation.  . pravastatin (PRAVACHOL) 40 MG tablet Take 1 tablet (40 mg total) by mouth daily.  Marland Kitchen VYZULTA 0.024 % SOLN Place 1 drop into the left eye at bedtime.    No facility-administered encounter medications on file as of 08/12/2020.  Allergies (verified) Amlodipine besylate and Influenza virus vaccine   History: Past Medical History:  Diagnosis Date  . Blind hypertensive eye, left 04/18/2012  . Changing skin lesion 11/23/2018  . CVA (cerebral vascular accident) ([place])   . Gait disturbance 10/05/2015   pt declined neuro referral per records  . Generalized weakness 05/15/2019  . Hard of hearing 12/02/2015   hearing aids  . Hyperlipidemia   . Hypertension   . Insomnia    "tried  melatonin, advil PM, xanax and klonopin without success"  . Leukocytosis 11/07/2013   seen oncology, did not want to continue to follow there, so continued follow up at PCP.  . Osteoporosis   . Polyneuropathy   . Primary open angle glaucoma 01/2011  . Spinal stenosis   . Stable branch retinal vein occlusion 01/06/2012  . Type 2 diabetes mellitus ([place])    "diet controlled"   Past Surgical History:  Procedure Laterality Date  . BUBBLE STUDY  05/17/2019   Procedure: BUBBLE STUDY;  Surgeon: Lelon Perla, MD;  [facility] END[facility];  Service: Cardiovascular;;  . EYE SURGERY    . [place]  2005  . TEE WITHOUT CARDIOVERSION N/A 05/17/2019   Procedure: TRANSESOPHAGEAL ECHOCARDIOGRAM (TEE);  Surgeon: Lelon Perla, MD;  Location: [facility] ENDOSCOPY;  Service: Cardiovascular;  Laterality: N/A;  . TOTAL HIP ARTHROPLASTY Left 1991  . TOTAL HIP ARTHROPLASTY Right ?   x 2   Family History  Problem Relation Age of Onset  . Heart disease Brother   . Cancer Other        prostate  . Ovarian cancer Neg Hx   . Breast cancer Neg Hx   . Uterine cancer Neg Hx   . Colon cancer Neg Hx    Social History   Socioeconomic History  . Marital status: Divorced    Spouse name: Not on file  . Number of children: 3  . Years of education: 52  . Highest education level: Not on file  Occupational History  . Not on file  Tobacco Use  . Smoking status: Never Smoker  . Smokeless tobacco: Never Used  Vaping Use  . Vaping Use: Never used  Substance and Sexual Activity  . Alcohol use: No  . Drug use: No  . Sexual activity: Never    Birth control/protection: Post-menopausal  Other Topics Concern  . Not on file  Social History Narrative   Divorced. 3 children [name], Max and [name]. Lives alone.   High school graduate.   Takes a daily vitamin.   Wears her seatbelt. Wears a hearing aid. Wears dentures.   Requires a cane for assistive device for walking.   Smoke detector in the home.   Feels  safe in her relationships.   Social Determinants of Health   Financial Resource Strain: Low Risk   . Difficulty of Paying Living Expenses: Not hard at all  Food Insecurity: No Food Insecurity  . Worried About Charity fundraiser in the Last Year: Never true  . Ran Out of Food in the Last Year: Never true  Transportation Needs: No Transportation Needs  . Lack of Transportation (Medical): No  . Lack of Transportation (Non-Medical): No  Physical Activity: Sufficiently Active  . Days of Exercise per Week: 7 days  . Minutes of Exercise per Session: 30 min  Stress: No Stress Concern Present  . Feeling of Stress : Not at all  Social Connections: Socially Isolated  . Frequency of Communication with Friends and Family: More  than three times a week  . Frequency of Social Gatherings with Friends and Family: More than three times a week  . Attends Religious Services: Never  . Active Member of Clubs or Organizations: No  . Attends Archivist Meetings: Never  . Marital Status: Divorced    Tobacco Counseling Counseling given: Not Answered   Clinical Intake:  Pre-visit preparation completed: Yes  Pain : No/denies pain     Nutritional Status: BMI of 19-24  Normal Nutritional Risks: None Diabetes: Yes CBG done?: No Did pt. bring in CBG monitor from home?: No (phone visit)  How often do you need to have someone help you when you read instructions, pamphlets, or other written materials from your doctor or pharmacy?: 1 - Never  Diabetes:  Is the patient diabetic?  Yes  If diabetic, was a CBG obtained today?  No  Did the patient bring in their glucometer from home?  No phone visit How often do you monitor your CBG's? never  Financial Strains and Diabetes Management:  Are you having any financial strains with the device, your supplies or your medication? No .  Does the patient want to be seen by Chronic Care Management for management of their diabetes?  No  Would the  patient like to be referred to a Nutritionist or for Diabetic Management?  No   Diabetic Exams:  Diabetic Eye Exam: Completed 08/20/2019.   Diabetic Foot Exam:  Pt has been advised about the importance in completing this exam.To be completed by PCP    Interpreter Needed?: No  Information entered by :: Caroleen Hamman LPN   Activities of Daily Living In your present state of health, do you have any difficulty performing the following activities: 08/12/2020  Hearing? Y  Comment hearing loss  Vision? N  Difficulty concentrating or making decisions? N  Walking or climbing stairs? N  Dressing or bathing? N  Doing errands, shopping? N  Preparing Food and eating ? N  Using the Toilet? N  In the past six months, have you accidently leaked urine? N  Do you have problems with loss of bowel control? N  Managing your Medications? N  Managing your Finances? N  Housekeeping or managing your Housekeeping? N  Some recent data might be hidden    Patient Care Team: Ma Hillock, DO as PCP - General (Family Medicine) Verdell Carmine, MD as Referring Physician (Internal Medicine) Renelda Loma, OD (Optometry) [facility] [place] (Ophthalmology) Manuela Neptune, DPM as Referring Physician (Podiatry)  Indicate any recent Medical Services you may have received from other than [facility] providers in the past year (date may be approximate).     Assessment:   This is a routine wellness examination for Madison Martinez.  Hearing/Vision screen  Hearing Screening   125Hz  250Hz  500Hz  1000Hz  2000Hz  3000Hz  4000Hz  6000Hz  8000Hz   Right ear:           Left ear:           Comments: Wears hearing aids  Vision Screening Comments: Blind in left eye Last eye exam-5 months ago-Dr. Manuella Ghazi  Dietary issues and exercise activities discussed: Current Exercise Habits: Home exercise routine, Type of exercise: stretching, Time (Minutes): 30, Frequency (Times/Week): 7, Weekly Exercise (Minutes/Week): 210, Intensity:  Mild, Exercise limited by: None identified  Goals Addressed            This Visit's Progress   . Patient Stated       Drink more water  Depression Screen PHQ 2/9 Scores 08/12/2020 02/26/2020 07/10/2019 08/03/2018 08/03/2018 05/25/2018 11/24/2017  PHQ - 2 Score 0 0 0 0 0 0 0    Fall Risk Fall Risk  08/12/2020 02/26/2020 06/27/2019 08/03/2018 05/25/2018  Falls in the past year? 0 0 0 0 0  Number falls in past yr: 0 0 - - -  Injury with Fall? 0 0 - - -  Risk for fall due to : - - - - -  Follow up Falls prevention discussed - Falls evaluation completed - Falls evaluation completed;Education provided    FALL RISK PREVENTION PERTAINING TO THE HOME:  Any stairs in or around the home? Yes  If so, are there any without handrails? No  Home free of loose throw rugs in walkways, pet beds, electrical cords, etc? Yes  Adequate lighting in your home to reduce risk of falls? Yes   ASSISTIVE DEVICES UTILIZED TO PREVENT FALLS:  Life alert? No  Use of a cane, walker or w/c? Yes  Grab bars in the bathroom? Yes  Shower chair or bench in shower? No  Elevated toilet seat or a handicapped toilet? No   TIMED UP AND GO:  Was the test performed? No .phone visit    Cognitive Function:Unable to complete 6CIT due to patient has difficulty hearing & son is assisting with exam. MMSE - Mini Mental State Exam 06/16/2017  Orientation to time 4  Orientation to Place 5  Registration 3  Attention/ Calculation 2  Recall 1  Language- name 2 objects 2  Language- repeat 1  Language- follow 3 step command 3  Language- read & follow direction 1  Write a sentence 1  Copy design 1  Total score 24        Immunizations  There is no immunization history on file for this patient.  TDAP status: Due, Education has been provided regarding the importance of this vaccine. Advised may receive this vaccine at local pharmacy or Health Dept. Aware to provide a copy of the vaccination record if obtained from local  pharmacy or Health Dept. Verbalized acceptance and understanding.  Flu Vaccine status: Declined, Education has been provided regarding the importance of this vaccine but patient still declined. Advised may receive this vaccine at local pharmacy or Health Dept. Aware to provide a copy of the vaccination record if obtained from local pharmacy or Health Dept. Verbalized acceptance and understanding.  Pneumococcal vaccine status: Declined,  Education has been provided regarding the importance of this vaccine but patient still declined. Advised may receive this vaccine at local pharmacy or Health Dept. Aware to provide a copy of the vaccination record if obtained from local pharmacy or Health Dept. Verbalized acceptance and understanding.   Covid-19 vaccine status: Declined, Education has been provided regarding the importance of this vaccine but patient still declined. Advised may receive this vaccine at local pharmacy or Health Dept.or vaccine clinic. Aware to provide a copy of the vaccination record if obtained from local pharmacy or Health Dept. Verbalized acceptance and understanding.  Qualifies for Shingles Vaccine? Yes   Zostavax completed No   Shingrix Completed?: No.    Education has been provided regarding the importance of this vaccine. Patient has been advised to call insurance company to determine out of pocket expense if they have not yet received this vaccine. Advised may also receive vaccine at local pharmacy or Health Dept. Verbalized acceptance and understanding.  Screening Tests Health Maintenance  Topic Date Due  . FOOT EXAM  05/25/2019  . OPHTHALMOLOGY  EXAM  08/19/2020  . HEMOGLOBIN A1C  08/26/2020  . HPV VACCINES  Aged Out  . DEXA SCAN  Discontinued  . TETANUS/TDAP  Discontinued  . COVID-19 Vaccine  Discontinued  . PNA vac Low Risk Adult  Discontinued    Health Maintenance  Health Maintenance Due  Topic Date Due  . FOOT EXAM  05/25/2019    Colorectal cancer screening:  No longer required.   Mammogram status: No longer required due to patient decision.  Bone Density status: Declined  Lung Cancer Screening: (Low Dose CT Chest recommended if Age 18-80 years, 30 pack-year currently smoking OR have quit w/in 15years.) does not qualify.    Additional Screening:  Hepatitis C Screening: does not qualify  Vision Screening: Recommended annual ophthalmology exams for early detection of glaucoma and other disorders of the eye. Is the patient up to date with their annual eye exam?  Yes  Who is the provider or what is the name of the office in which the patient attends annual eye exams? Dr. Manuella Ghazi .   Dental Screening: Recommended annual dental exams for proper oral hygiene  Community Resource Referral / Chronic Care Management: CRR required this visit?  No   CCM required this visit?  No      Plan:     I have personally reviewed and noted the following in the patient's chart:   . Medical and social history . Use of alcohol, tobacco or illicit drugs  . Current medications and supplements including opioid prescriptions.  . Functional ability and status . Nutritional status . Physical activity . Advanced directives . List of other physicians . Hospitalizations, surgeries, and ER visits in previous 12 months . Vitals . Screenings to include cognitive, depression, and falls . Referrals and appointments  In addition, I have reviewed and discussed with patient certain preventive protocols, quality metrics, and best practice recommendations. A written personalized care plan for preventive services as well as general preventive health recommendations were provided to patient.   Due to this being a telephonic visit, the after visit summary with patients personalized plan was offered to patient via mail or my-chart.  per request, patient was mailed a copy of [place], LPN   [id number]/54/[id number]  Nurse Health Advisor  Nurse Notes: None

## 2020-08-12 ENCOUNTER — Ambulatory Visit (INDEPENDENT_AMBULATORY_CARE_PROVIDER_SITE_OTHER): Payer: Medicare Other

## 2020-08-12 VITALS — Ht 64.0 in | Wt 136.0 lb

## 2020-08-12 DIAGNOSIS — Z0000 Encounter for general adult medical examination without abnormal findings: Secondary | ICD-10-CM

## 2020-08-12 NOTE — Patient Instructions (Signed)
Madison Martinez , Thank you for taking time to complete your Medicare Wellness Visit. I appreciate your ongoing commitment to your health goals. Please review the following plan we discussed and let me know if I can assist you in the future.   Screening recommendations/referrals: Colonoscopy: No longer required Mammogram: Declined Bone Density: Declined Recommended yearly ophthalmology/optometry visit for glaucoma screening and checkup Recommended yearly dental visit for hygiene and checkup  Vaccinations: Influenza vaccine: Allergic Pneumococcal vaccine: Declined Tdap vaccine: Declined Shingles vaccine: Declined   Covid-19:Declined  Advanced directives: Please bring a copy for your chart  Conditions/risks identified: See problem list  Next appointment: Follow up in one year for your annual wellness visit    Preventive Care 65 Years and Older, Female Preventive care refers to lifestyle choices and visits with your health care provider that can promote health and wellness. What does preventive care include?  A yearly physical exam. This is also called an annual well check.  Dental exams once or twice a year.  Routine eye exams. Ask your health care provider how often you should have your eyes checked.  Personal lifestyle choices, including:  Daily care of your teeth and gums.  Regular physical activity.  Eating a healthy diet.  Avoiding tobacco and drug use.  Limiting alcohol use.  Practicing safe sex.  Taking low-dose aspirin every [place].  Taking vitamin and mineral supplements as recommended by your health care provider. What happens during an annual well check? The services and screenings done by your health care provider during your annual well check will depend on your age, overall health, lifestyle risk factors, and family history of disease. Counseling  Your health care provider may ask you questions about your:  Alcohol use.  Tobacco use.  Drug  use.  Emotional well-being.  Home and relationship well-being.  Sexual activity.  Eating habits.  History of falls.  Memory and ability to understand (cognition).  Work and work Statistician.  Reproductive health. Screening  You may have the following tests or measurements:  Height, weight, and BMI.  Blood pressure.  Lipid and cholesterol levels. These may be checked every 5 years, or more frequently if you are over 1 years old.  Skin check.  Lung cancer screening. You may have this screening every year starting at age 78 if you have a 30-pack-year history of smoking and currently smoke or have quit within the past 15 years.  Fecal occult blood test (FOBT) of the stool. You may have this test every year starting at age 22.  Flexible sigmoidoscopy or colonoscopy. You may have a sigmoidoscopy every 5 years or a colonoscopy every 10 years starting at age 23.  Hepatitis C blood test.  Hepatitis B blood test.  Sexually transmitted disease (STD) testing.  Diabetes screening. This is done by checking your blood sugar (glucose) after you have not eaten for a while (fasting). You may have this done every 1-3 years.  Bone density scan. This is done to screen for osteoporosis. You may have this done starting at age 6.  Mammogram. This may be done every 1-2 years. Talk to your health care provider about how often you should have regular mammograms. Talk with your health care provider about your test results, treatment options, and if necessary, the need for more tests. Vaccines  Your health care provider may recommend certain vaccines, such as:  Influenza vaccine. This is recommended every year.  Tetanus, diphtheria, and acellular pertussis (Tdap, Td) vaccine. You may need a Td booster  every 10 years.  Zoster vaccine. You may need this after age 33.  Pneumococcal 13-valent conjugate (PCV13) vaccine. One dose is recommended after age 48.  Pneumococcal polysaccharide  (PPSV23) vaccine. One dose is recommended after age 44. Talk to your health care provider about which screenings and vaccines you need and how often you need them. This information is not intended to replace advice given to you by your health care provider. Make sure you discuss any questions you have with your health care provider. Document Released: 04/03/2015 Document Revised: 11/25/2015 Document Reviewed: 01/06/2015 Elsevier Interactive Patient Education  2017 [employer] Prevention in the Home Falls can cause injuries. They can happen to people of all ages. There are many things you can do to make your home safe and to help prevent falls. What can I do on the outside of my home?  Regularly fix the edges of walkways and driveways and fix any cracks.  Remove anything that might make you trip as you walk through a door, such as a raised step or threshold.  Trim any bushes or trees on the path to your home.  Use bright outdoor lighting.  Clear any walking paths of anything that might make someone trip, such as rocks or tools.  Regularly check to see if handrails are loose or broken. Make sure that both sides of any steps have handrails.  Any raised decks and porches should have guardrails on the edges.  Have any leaves, snow, or ice cleared regularly.  Use sand or salt on walking paths during winter.  Clean up any spills in your garage right away. This includes oil or grease spills. What can I do in the bathroom?  Use night lights.  Install grab bars by the toilet and in the tub and shower. Do not use towel bars as grab bars.  Use non-skid mats or decals in the tub or shower.  If you need to sit down in the shower, use a plastic, non-slip stool.  Keep the floor dry. Clean up any water that spills on the floor as soon as it happens.  Remove soap buildup in the tub or shower regularly.  Attach bath mats securely with double-sided non-slip rug tape.  Do not have  throw rugs and other things on the floor that can make you trip. What can I do in the bedroom?  Use night lights.  Make sure that you have a light by your bed that is easy to reach.  Do not use any sheets or blankets that are too big for your bed. They should not hang down onto the floor.  Have a firm chair that has side arms. You can use this for support while you get dressed.  Do not have throw rugs and other things on the floor that can make you trip. What can I do in the kitchen?  Clean up any spills right away.  Avoid walking on wet floors.  Keep items that you use a lot in easy-to-reach places.  If you need to reach something above you, use a strong step stool that has a grab bar.  Keep electrical cords out of the way.  Do not use floor polish or wax that makes floors slippery. If you must use wax, use non-skid floor wax.  Do not have throw rugs and other things on the floor that can make you trip. What can I do with my stairs?  Do not leave any items on the stairs.  Make  sure that there are handrails on both sides of the stairs and use them. Fix handrails that are broken or loose. Make sure that handrails are as long as the stairways.  Check any carpeting to make sure that it is firmly attached to the stairs. Fix any carpet that is loose or worn.  Avoid having throw rugs at the top or bottom of the stairs. If you do have throw rugs, attach them to the floor with carpet tape.  Make sure that you have a light switch at the top of the stairs and the bottom of the stairs. If you do not have them, ask someone to add them for you. What else can I do to help prevent falls?  Wear shoes that:  Do not have high heels.  Have rubber bottoms.  Are comfortable and fit you well.  Are closed at the toe. Do not wear sandals.  If you use a stepladder:  Make sure that it is fully opened. Do not climb a closed stepladder.  Make sure that both sides of the stepladder are  locked into place.  Ask someone to hold it for you, if possible.  Clearly mark and make sure that you can see:  Any grab bars or handrails.  First and last steps.  Where the edge of each step is.  Use tools that help you move around (mobility aids) if they are needed. These include:  Canes.  Walkers.  Scooters.  Crutches.  Turn on the lights when you go into a dark area. Replace any light bulbs as soon as they burn out.  Set up your furniture so you have a clear path. Avoid moving your furniture around.  If any of your floors are uneven, fix them.  If there are any pets around you, be aware of where they are.  Review your medicines with your doctor. Some medicines can make you feel dizzy. This can increase your chance of falling. Ask your doctor what other things that you can do to help prevent falls. This information is not intended to replace advice given to you by your health care provider. Make sure you discuss any questions you have with your health care provider. Document Released: 01/01/2009 Document Revised: 08/13/2015 Document Reviewed: 04/11/2014 Elsevier Interactive Patient Education  2017 [employer].

## 2020-08-14 IMAGING — MR MR HEAD W/O CM
12 of 14 series · 34 of 48 positions shown · non-contrast
Comparison: Prior CT from 05/14/2019.

CLINICAL DATA: Initial evaluation for acute altered mental status.

EXAM:
MRI HEAD WITHOUT CONTRAST
MRA HEAD WITHOUT CONTRAST
TECHNIQUE: Multiplanar, multiecho pulse sequences of the brain and surrounding
structures were obtained without intravenous contrast. Angiographic
images of the head were obtained using MRA technique without
contrast.

[Series 5: DWI · axial · 3.0mm · 0.88mm/px · z∈[-103,+34]mm · 6 of 94 slices shown (1 of 4)]
[im 1/94]
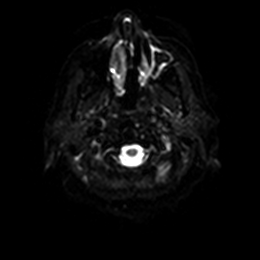
[im 19/94]
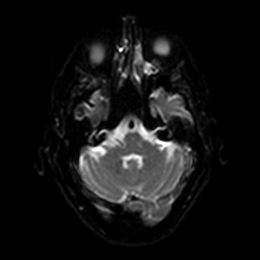
[im 38/94]
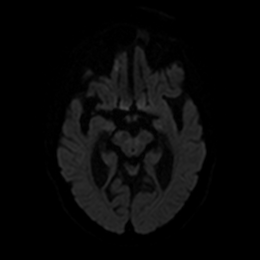
[im 56/94]
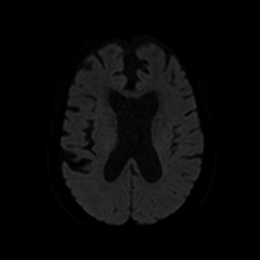
[im 75/94]
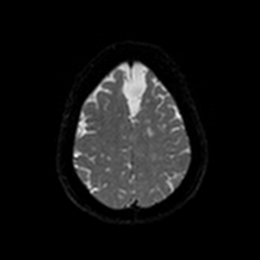
[im 94/94]
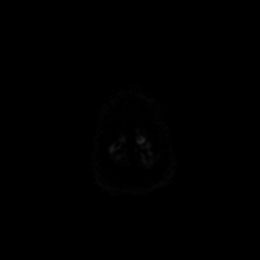

[Series 6: DWI · axial · 3.0mm · 0.88mm/px · z∈[-103,+34]mm · 3 of 47 slices shown (2 of 4)]
[im 1/47]
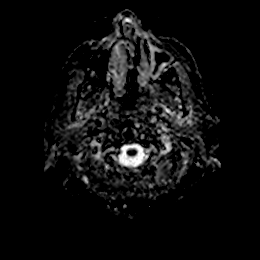
[im 24/47]
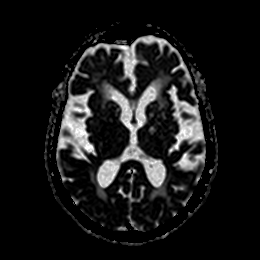
[im 47/47]
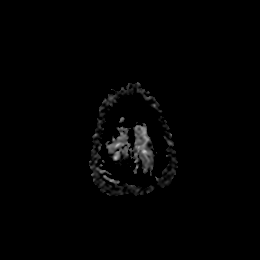

[Series 11: DWI · coronal · 4.0mm · 0.88mm/px · 4 of 70 slices shown (3 of 4)]
[im 1/70]
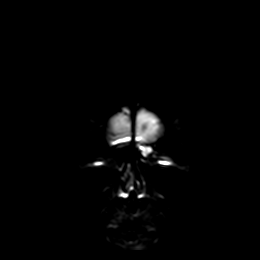
[im 24/70]
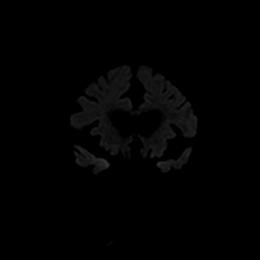
[im 47/70]
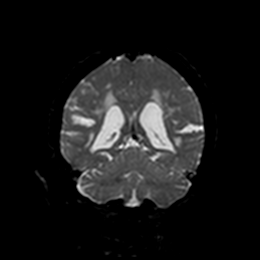
[im 70/70]
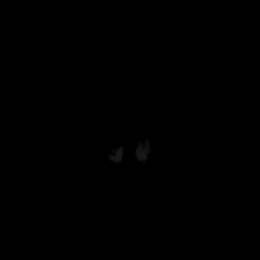

[Series 12: DWI · coronal · 4.0mm · 0.88mm/px · 2 of 35 slices shown (4 of 4)]
[im 1/35]
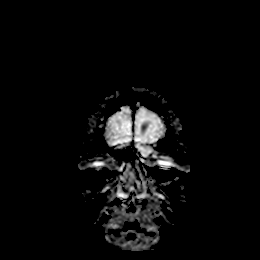
[im 35/35]
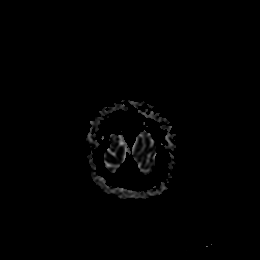

[Series 13: T1 · sagittal · 5.0mm · 0.75mm/px · 1 of 23 slices shown]
[im 1/23]
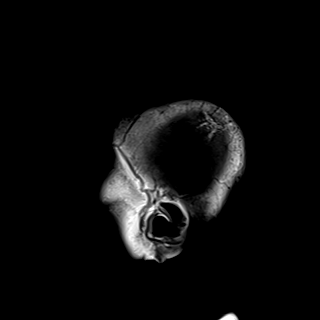

[Series 14: T2 · axial · 5.0mm · 0.72mm/px · 1 of 25 slices shown (1 of 2)]
[im 1/25]
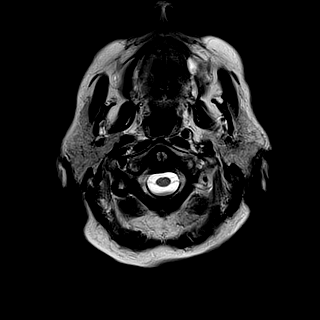

[Series 15: FLAIR · axial · 5.0mm · 0.45mm/px · 1 of 25 slices shown]
[im 1/25]
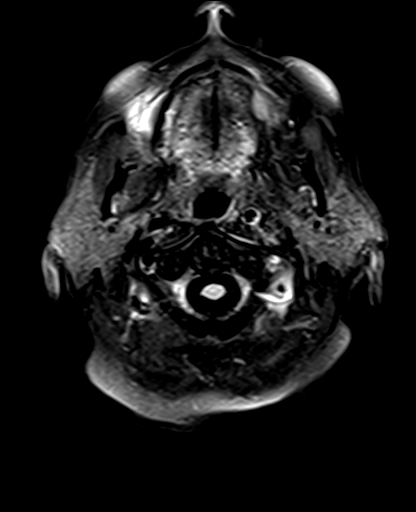

[Series 16: mag_images · axial · 3.0mm · 0.90mm/px · z∈[-118,+58]mm · 4 of 60 slices shown]
[im 1/60]
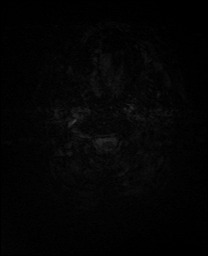
[im 20/60]
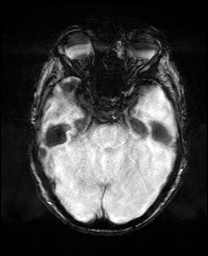
[im 40/60]
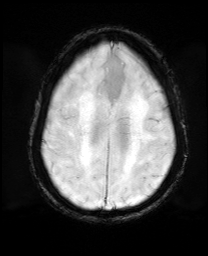
[im 60/60]
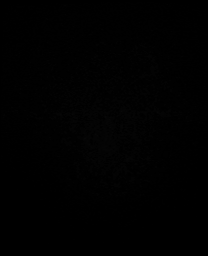

[Series 17: pha_images · axial · 3.0mm · 0.90mm/px · z∈[-118,+52]mm · 3 of 57 slices shown]
[im 1/57]
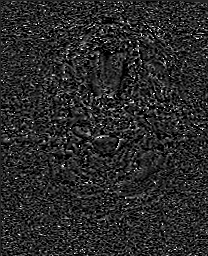
[im 29/57]
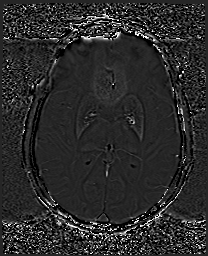
[im 57/57]
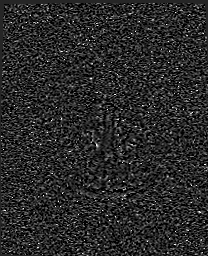

[Series 18: swi_images · axial · 3.0mm · 0.90mm/px · z∈[-118,+58]mm · 4 of 60 slices shown]
[im 1/60]
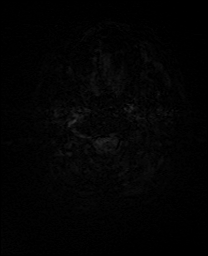
[im 20/60]
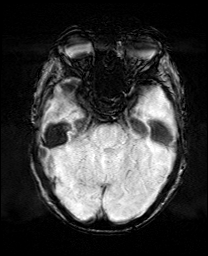
[im 40/60]
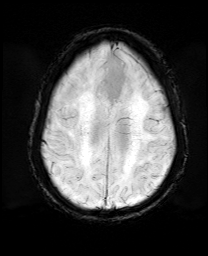
[im 60/60]
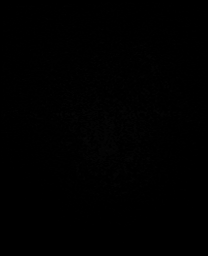

[Series 19: mip_images(sw) · axial · 24.0mm · 0.90mm/px · z∈[-108,+48]mm · 3 of 53 slices shown]
[im 1/53]
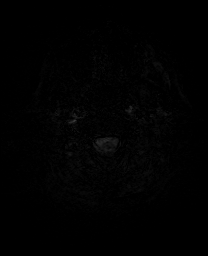
[im 27/53]
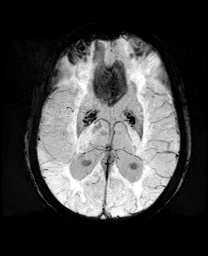
[im 53/53]
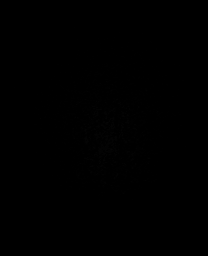

[Series 21: T2 · coronal · 5.0mm · 0.72mm/px · 2 of 28 slices shown (2 of 2)]
[im 1/28]
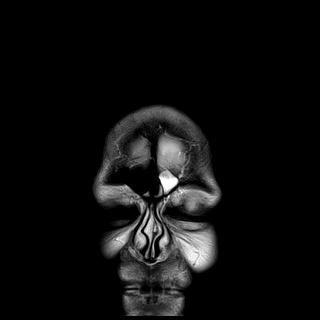
[im 28/28]
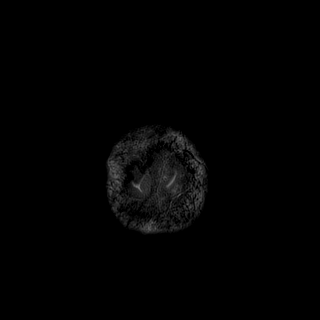

[34 of 48 positions shown; findings below may reference images not displayed]

FINDINGS: MRI HEAD FINDINGS

Brain: Moderately advanced cerebral atrophy. Patchy and confluent
T2/FLAIR hyperintensity within the periventricular deep white matter
both cerebral hemispheres most consistent with chronic small vessel
ischemic disease, moderate to advanced in nature. Superimposed
remote lacunar infarcts noted involving the bilateral basal ganglia.
Additional small remote lacune noted at the central pons. Tiny
remote left cerebellar infarct.

Punctate 4 mm focus of restricted diffusion seen involving the
cortex of the posterior left frontoparietal region (series 5, image
77). Additional tiny punctate 4-5 mm focus of restricted diffusion
seen involving the cortical gray matter of the posterior left
parietal lobe (series 5, image 83). Findings consistent with tiny
acute ischemic infarcts, likely embolic. No associated hemorrhage or
mass effect. No other evidence for acute or subacute ischemia.
Gray-white matter differentiation otherwise maintained. No foci of
susceptibility artifact to suggest acute or chronic intracranial
hemorrhage.

No mass lesion, midline shift or mass effect. Diffuse ventricular
prominence related to global parenchymal volume loss without
hydrocephalus. No extra-axial fluid collection. Pituitary gland
within normal limits.

Vascular: Absent flow void within the hypoplastic left vertebral
artery, likely occluded. Major intracranial vascular flow voids
otherwise maintained.

Skull and upper cervical spine: Craniocervical junction within
normal limits. Bone marrow signal intensity normal. No scalp soft
tissue abnormality.

Sinuses/Orbits: Patient status post bilateral ocular lens
replacement. Opacification of the left frontoethmoidal sinuses, with
moderate mucosal thickening within the left maxillary sinus.
Paranasal sinuses are otherwise clear. Trace bilateral mastoid
effusions, of doubtful significance. Inner ear structures grossly
normal.

Other: None.

MRA HEAD FINDINGS

ANTERIOR CIRCULATION:

Distal cervical segments of the internal carotid arteries are patent
with symmetric antegrade flow. Petrous segments widely patent
bilaterally. Scattered calcified plaque throughout the
cavernous/supraclinoid ICAs bilaterally. Associated moderate diffuse
narrowing throughout the cavernous left ICA. Associated moderate to
severe multifocal narrowing at the anterior genu of the cavernous
right ICA. 5 mm focal outpouching extending laterally and slightly
posteriorly from the anterior genu of the cavernous right ICA
consistent with aneurysm (series 7, image 82). A1 segments patent
bilaterally. Normal anterior communicating artery. A2 segments
widely patent proximally. Short-segment moderate to severe distal
bilateral A2 stenoses noted.

There is a focal severe fairly long segment stenosis extending from
the left carotid terminus into the mid left M1 segment with loss of
normal flow related signal. Stenosis measures approximately 8 mm in
length. Left M1 segment patent distally. Normal left MCA
bifurcation. Left MCA branches well perfused distally.

Right M1 irregular but widely patent. Normal right MCA bifurcation.
Distal right MCA branches well perfused.

POSTERIOR CIRCULATION:

Dominant right vertebral artery with hypoplastic left vertebral
artery. Faint attenuated flow seen within the left vertebral artery
at the skull base, and remains grossly patent to the level of the
left PICA left PICA perfused. Distally, left V4 segment is occluded
to the vertebrobasilar junction. Dominant right vertebral artery
widely patent at the skull base. Irregular approximate 60% stenosis
seen involving the mid right V4 segment. Right PICA patent. Mild
multifocal irregularity seen throughout the basilar artery which
remains widely patent to its distal aspect. Superior cerebral
arteries patent bilaterally. Both PCAs primarily supplied via the
basilar. Multifocal atheromatous irregularity without high-grade
stenosis seen throughout both PCAs.
IMPRESSION: MRI HEAD IMPRESSION:

1. Two punctate subcentimeter acute ischemic nonhemorrhagic cortical
infarcts involving the left frontoparietal region as above, likely
embolic in nature.
2. Underlying moderately advanced cerebral atrophy with chronic
small vessel ischemic disease.
3. Left sided paranasal sinus disease as above.

MRA HEAD IMPRESSION:

1. Approximate 8 mm severe stenosis extending from the left carotid
terminus into the left M1 segment. Left MCA branches are perfused
distally.
2. Occlusion of the hypoplastic left V4 segment just beyond the
takeoff of the left PICA. Short-segment approximate 60% right V4
stenosis as above.
3. Extensive atheromatous change throughout the carotid siphons with
associated moderate to advanced multifocal stenoses.
4. 5 mm cavernous right ICA aneurysm as above.

## 2020-10-02 DIAGNOSIS — M5431 Sciatica, right side: Secondary | ICD-10-CM | POA: Diagnosis not present

## 2020-10-07 DIAGNOSIS — H401111 Primary open-angle glaucoma, right eye, mild stage: Secondary | ICD-10-CM | POA: Diagnosis not present

## 2020-10-07 DIAGNOSIS — H4052X3 Glaucoma secondary to other eye disorders, left eye, severe stage: Secondary | ICD-10-CM | POA: Diagnosis not present

## 2021-01-08 ENCOUNTER — Telehealth: Payer: Self-pay

## 2021-01-08 MED ORDER — CLOPIDOGREL BISULFATE 75 MG PO TABS: 75.0000 mg | ORAL_TABLET | Freq: Every day | ORAL | 0 refills | Status: DC

## 2021-01-08 NOTE — Telephone Encounter (Signed)
Informed pt that grace period was sent to pharmacy. Pt will need to contact prescribing provider for eye drops. Pt will sched appt for [facility].

## 2021-01-08 NOTE — Telephone Encounter (Signed)
Patient son came in [facility]) about refills on 2 of her meds. [facility] mail order pharmacy does not have 2 of her prescriptions and she has ran out of meds.   Son can be reached at [phone]  Needs to be called to local pharmacy today. [facility]  clopidogrel (PLAVIX) 75 MG tablet [[id number]]   VYZULTA 0.024 % SOLN [[id number]]

## 2021-01-11 ENCOUNTER — Telehealth: Payer: Self-pay

## 2021-01-11 DIAGNOSIS — I10 Essential (primary) hypertension: Secondary | ICD-10-CM

## 2021-01-11 MED ORDER — HYDRALAZINE HCL 25 MG PO TABS: 25.0000 mg | ORAL_TABLET | Freq: Three times a day (TID) | ORAL | 0 refills | Status: DC

## 2021-01-11 MED ORDER — METOPROLOL SUCCINATE ER 25 MG PO TB24: 25.0000 mg | ORAL_TABLET | Freq: Every day | ORAL | 0 refills | Status: DC

## 2021-01-11 NOTE — Telephone Encounter (Signed)
Rx sent. Pt will need a f/u for any further refills, LVM with son, Max.

## 2021-01-11 NOTE — Telephone Encounter (Signed)
Patient son (DPR) calling about sending new prescriptions to new pharmacy.  Patient will not be using mail order pharmacy effective today.  Please update preferred pharmacy to: [facility]   hydrALAZINE (APRESOLINE) 25 MG tablet [[id number]]   metoprolol succinate (TOPROL-XL) 25 MG 24 hr tablet [[id number]]

## 2021-01-11 NOTE — Telephone Encounter (Signed)
Patient son, Max, returning Toria's call.  Toria not available to speak to him, but advised me on what to tell him.  See notes from [place] below,  Rx sent. Pt will need a f/u for any further refills, LVM with son, Max.  He asked if her message meant her prescription was filled for another month.  I told him, yes, and we need to set up an appt for [place] with Dr. Raoul Pitch, no future fills without appt.  He declined setting up appt and stated he would do that at another time.  He disconnected call.

## 2021-01-27 DIAGNOSIS — I10 Essential (primary) hypertension: Secondary | ICD-10-CM | POA: Diagnosis not present

## 2021-01-27 DIAGNOSIS — M5431 Sciatica, right side: Secondary | ICD-10-CM | POA: Diagnosis not present

## 2021-01-27 DIAGNOSIS — Z79899 Other long term (current) drug therapy: Secondary | ICD-10-CM | POA: Diagnosis not present

## 2021-02-11 ENCOUNTER — Other Ambulatory Visit: Payer: Self-pay | Admitting: Family Medicine

## 2021-03-18 DIAGNOSIS — M5431 Sciatica, right side: Secondary | ICD-10-CM | POA: Diagnosis not present

## 2021-04-21 DIAGNOSIS — H401111 Primary open-angle glaucoma, right eye, mild stage: Secondary | ICD-10-CM | POA: Diagnosis not present

## 2021-05-11 ENCOUNTER — Other Ambulatory Visit: Payer: Self-pay | Admitting: Family Medicine

## 2021-05-11 DIAGNOSIS — I10 Essential (primary) hypertension: Secondary | ICD-10-CM

## 2021-05-12 ENCOUNTER — Other Ambulatory Visit: Payer: Self-pay

## 2021-05-12 ENCOUNTER — Ambulatory Visit (INDEPENDENT_AMBULATORY_CARE_PROVIDER_SITE_OTHER): Payer: Medicare Other | Admitting: Family Medicine

## 2021-05-12 ENCOUNTER — Encounter: Payer: Self-pay | Admitting: Family Medicine

## 2021-05-12 VITALS — BP 177/77 | HR 74 | Temp 98.0°F | Ht 64.0 in | Wt 142.2 lb

## 2021-05-12 DIAGNOSIS — E119 Type 2 diabetes mellitus without complications: Secondary | ICD-10-CM

## 2021-05-12 DIAGNOSIS — I10 Essential (primary) hypertension: Secondary | ICD-10-CM

## 2021-05-12 DIAGNOSIS — N1832 Chronic kidney disease, stage 3b: Secondary | ICD-10-CM

## 2021-05-12 DIAGNOSIS — M810 Age-related osteoporosis without current pathological fracture: Secondary | ICD-10-CM | POA: Diagnosis not present

## 2021-05-12 DIAGNOSIS — E785 Hyperlipidemia, unspecified: Secondary | ICD-10-CM

## 2021-05-12 LAB — CBC WITH DIFFERENTIAL/PLATELET
Basophils Absolute: 0 10*3/uL (ref 0.0–0.1)
Basophils Relative: 0.5 % (ref 0.0–3.0)
Eosinophils Absolute: 0.2 10*3/uL (ref 0.0–0.7)
Eosinophils Relative: 2.3 % (ref 0.0–5.0)
HCT: 38 % (ref 36.0–46.0)
Hemoglobin: 12.7 g/dL (ref 12.0–15.0)
Lymphocytes Relative: 42.9 % (ref 12.0–46.0)
Lymphs Abs: 3.7 10*3/uL (ref 0.7–4.0)
MCHC: 33.5 g/dL (ref 30.0–36.0)
MCV: 92.1 fl (ref 78.0–100.0)
Monocytes Absolute: 0.6 10*3/uL (ref 0.1–1.0)
Monocytes Relative: 7.1 % (ref 3.0–12.0)
Neutro Abs: 4.1 10*3/uL (ref 1.4–7.7)
Neutrophils Relative %: 47.2 % (ref 43.0–77.0)
Platelets: 178 10*3/uL (ref 150.0–400.0)
RBC: 4.13 Mil/uL (ref 3.87–5.11)
RDW: 13 % (ref 11.5–15.5)
WBC: 8.7 10*3/uL (ref 4.0–10.5)

## 2021-05-12 LAB — LDL CHOLESTEROL, DIRECT: Direct LDL: 129 mg/dL

## 2021-05-12 LAB — VITAMIN D 25 HYDROXY (VIT D DEFICIENCY, FRACTURES): VITD: 77.49 ng/mL (ref 30.00–100.00)

## 2021-05-12 LAB — COMPREHENSIVE METABOLIC PANEL
ALT: 21 U/L (ref 0–35)
AST: 29 U/L (ref 0–37)
Albumin: 4.4 g/dL (ref 3.5–5.2)
Alkaline Phosphatase: 70 U/L (ref 39–117)
BUN: 35 mg/dL — ABNORMAL HIGH (ref 6–23)
CO2: 27 mEq/L (ref 19–32)
Calcium: 9.7 mg/dL (ref 8.4–10.5)
Chloride: 105 mEq/L (ref 96–112)
Creatinine, Ser: 1.12 mg/dL (ref 0.40–1.20)
GFR: 42.91 mL/min — ABNORMAL LOW (ref >60.00)
Glucose, Bld: 103 mg/dL — ABNORMAL HIGH (ref 70–99)
Potassium: 4.3 mEq/L (ref 3.5–5.1)
Sodium: 138 mEq/L (ref 135–145)
Total Bilirubin: 0.6 mg/dL (ref 0.2–1.2)
Total Protein: 7.2 g/dL (ref 6.0–8.3)

## 2021-05-12 LAB — LIPID PANEL
Cholesterol: 238 mg/dL — ABNORMAL HIGH (ref 0–200)
HDL: 46.3 mg/dL (ref >39.00)
NonHDL: 191.43
Total CHOL/HDL Ratio: 5
Triglycerides: 261 mg/dL — ABNORMAL HIGH (ref 0.0–149.0)
VLDL: 52.2 mg/dL — ABNORMAL HIGH (ref 0.0–40.0)

## 2021-05-12 LAB — HEMOGLOBIN A1C: Hgb A1c MFr Bld: 6.4 % (ref 4.6–6.5)

## 2021-05-12 LAB — T4, FREE: Free T4: 0.79 ng/dL (ref 0.60–1.60)

## 2021-05-12 LAB — TSH: TSH: 5.1 u[IU]/mL (ref 0.35–5.50)

## 2021-05-12 MED ORDER — LISINOPRIL 40 MG PO TABS: 40.0000 mg | ORAL_TABLET | Freq: Every day | ORAL | 0 refills | Status: DC

## 2021-05-12 MED ORDER — LISINOPRIL 40 MG PO TABS: 40.0000 mg | ORAL_TABLET | Freq: Every day | ORAL | 1 refills | Status: DC

## 2021-05-12 MED ORDER — METOPROLOL SUCCINATE ER 25 MG PO TB24: 25.0000 mg | ORAL_TABLET | Freq: Every day | ORAL | 1 refills | Status: DC

## 2021-05-12 MED ORDER — PRAVASTATIN SODIUM 40 MG PO TABS: 40.0000 mg | ORAL_TABLET | Freq: Every day | ORAL | 3 refills | Status: DC

## 2021-05-12 MED ORDER — CLOPIDOGREL BISULFATE 75 MG PO TABS: 75.0000 mg | ORAL_TABLET | Freq: Every day | ORAL | 1 refills | Status: DC

## 2021-05-12 MED ORDER — HYDRALAZINE HCL 25 MG PO TABS: 25.0000 mg | ORAL_TABLET | Freq: Three times a day (TID) | ORAL | 5 refills | Status: DC

## 2021-05-12 NOTE — Patient Instructions (Addendum)
° ° °  Please make an appt in 1 month to recheck Blood pressure on all meds.

## 2021-05-12 NOTE — Progress Notes (Signed)
Madison Martinez , [birth year], 88 y.o., female MRN: [id number] Patient Care Team    Relationship Specialty Notifications Start End  Ma Hillock, DO PCP - General Family Medicine  09/01/16   Verdell Carmine, MD Referring Physician Internal Medicine  09/01/16   Renelda Loma, OD  Optometry  09/01/16   [facility]  Ophthalmology  06/16/17   Manuela Neptune, DPM Referring Physician Podiatry  06/16/17     Chief Complaint  Patient presents with   Hypertension    Cmc; fasting      Subjective:  Madison Martinez  is a 88 y.o. female presents for Essential hypertension/hyperlipidemia/CKD3/CVA/aortic atherosclerosis/chronic anticoagulation/CVA Pt reports compliance with Metoprolol-XL 25 mg daily, hydralazine 25 mg TID but reports she thought she was suppose to stop the lisinopril and has not had in some time. Patient denies chest pain, shortness of breath, dizziness or lower extremity edema.  She is taking statin. She reports compliance with Plavix.  She has followed with neurology status post stroke. Diet: Monitors her diet, low-sodium Exercise: Does not exercise much, walks with a cane RF: Hypertension, hyperlipidemia, family history of heart disease   Type 2 diabetes mellitus without complication, without long-term current use of insulin ([place])- Diet controlled.  Diet controlled now. Had been on glipizide low dose in the past PNA series: She has declined Pneumovax Flu shot: She declines flu shot (recommneded yearly) A1c: 6.6> 5.5 >5.2> 5.2 again today  Monoclonal B-cell lymphocytosis She is suppose to follow with her heme/onc every 3 months- but has not in some time. CBC has been stable here and she would like to continue following cbc here.  She is due for labs.  Elevated TSH: Last TSH 06/2019 just mildly elevated TSH.    EEG-05/15/2019 IMPRESSION: This study is suggestive of non specific cortical dysfunction in left frontotemporal region. No seizures or definite epileptiform  discharges were seen throughout the recording. [place]   MR ANGIO HEAD WO CONTRAST- 05/15/2019 IMPRESSION: 1. Two punctate subcentimeter acute ischemic nonhemorrhagic cortical infarcts involving the left frontoparietal region as above, likely embolic in nature. 2. Underlying moderately advanced cerebral atrophy with chronic small vessel ischemic disease.  MR BRAIN WO CONTRAST- 05/15/2019 IMPRESSION: 1. Approximate 8 mm severe stenosis extending from the left carotid terminus into the left M1 segment. Left MCA branches are perfused distally. 2. 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 Short-segment approximate 60% right V4 stenosis as above. 3. Extensive atheromatous change throughout the carotid siphons with associated moderate to advanced multifocal stenoses. 4. 5 mm cavernous right ICA aneurysm .  US RENAL-05/15/2019 Right Kidney: Renal measurements: 8.6 x 5.3 x 3.1 cm = volume: 73.8 mL . Echogenicity within normal limits. No worrisome mass, shadowing calculus or hydronephrosis visualized.  Left Kidney: Renal measurements: 9.4 x 5.3 x 3.6 cm = volume: 94 mL. 0.8 x 0.6 x 0.6 cm anechoic cystic lesion in the interpolar left kidney most compatible with simple renal cyst corresponding well to a partially exophytic cyst on CT. No worrisome renal lesion. No shadowing calculus or hydronephrosis.  Bladder: Appears normal for degree of bladder distention.  IMPRESSION: Anechoic 8 mm likely simple cyst in the left kidney. Otherwise unremarkable renal ultrasound.    ECHOCARDIOGRAM COMPLETE-05/15/2019 IMPRESSIONS  1. There is a mass (0.8 cm x 0.5 cm) that appears to be attached to the right coronary cusp of the aortic valve. It does appear calcified and is best seen  in the PLAX views. There is no destruction of the valve to suggest vegetation/endocarditis. 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 A TEE is recommended for better  characterization in this patient with stroke.          ECHO TEE- 05/17/2019 IMPRESSIONS   Normal LV function; moderate LAE; trace AI; mild MR and TR.   Left ventricular ejection fraction, by estimation, is 55 to 60%. The left ventricle has normal function. The left ventricle has no regional wall motion abnormalities.   Right ventricular systolic function is normal. The right ventricular size is normal. Aortic Valve: The aortic valve is tricuspid. Aortic valve regurgitation is trivial. Mild to moderate aortic valve sclerosis/calcification is present, without any evidence of aortic stenosis. Aorta: The aortic root is normal in size and structure. There is moderate (Grade III) plaque involving the descending aorta.  05/20/2019-Carotid Arterial Duplex Study  Summary: Right Carotid: Velocities in the right ICA are consistent with a 1-39% stenosis. Left Carotid: Velocities in the left ICA are consistent with a 1-39% stenosis. Vertebrals: Bilateral vertebral arteries demonstrate antegrade flow.   Depression screen [facility] 2/9 08/12/2020 02/26/2020 07/10/2019 08/03/2018 08/03/2018  Decreased Interest 0 0 0 0 0  Down, Depressed, Hopeless 0 0 0 0 0  PHQ - 2 Score 0 0 0 0 0    Allergies  Allergen Reactions   Amlodipine Besylate Other (See Comments)    Vertigo   Influenza Virus Vaccine Swelling    Site of swelling not known by son   Social History   Tobacco Use   Smoking status: Never   Smokeless tobacco: Never  Substance Use Topics   Alcohol use: No   Past Medical History:  Diagnosis Date   Blind hypertensive eye, left 04/18/2012   Changing skin lesion 11/23/2018   CVA (cere[facility] vascular accident[facility])    Gait disturbance 10/05/2015   pt declined neuro referral per records   Generalized weakness 05/15/2019   Hard of hearing 12/02/2015   hearing aids   Hyperlipidemia    Hypertension    Insomnia    "tried melatonin, advil PM, xanax and klonopin without success"   Leukocytosis 11/07/2013    seen oncology, did not want to continue to follow there, so continued follow up at PCP.   Osteoporosis    Polyneuropathy    Primary open angle glaucoma 01/2011   Spinal stenosis    Stable branch retinal vein occlusion 01/06/2012   Type 2 diabetes mellitus ([place])    "diet controlled"   Past Surgical History:  Procedure Laterality Date   BUBBLE STUDY  05/17/2019   Procedure: BUBBLE STUDY;  Surgeon: Lelon Perla, MD;  Location: [facility] ENDOSCOPY;  Service: Cardiovascular;;   [place]  2005   TEE WITHOUT CARDIOVERSION N/A 05/17/2019   Procedure: TRANSESOPHAGEAL ECHOCARDIOGRAM (TEE);  Surgeon: Lelon Perla, MD;  Location[facility] ENDOSCOPY;  Service: Cardiovascular;  Laterality: N/A;   TOTAL HIP ARTHROPLASTY Left 1991   TOTAL HIP ARTHROPLASTY Right ?   x 2   Family History  Problem Relation Age of Onset   Heart disease Brother    Cancer Other        prostate   Ovarian cancer Neg Hx    Breast cancer Neg Hx    Uterine cancer Neg Hx    Colon cancer Neg Hx    Allergies as of 05/12/2021       Reactions   Amlodipine Besylate Other (See Comments)  Vertigo   Influenza Virus Vaccine Swelling   Site of swelling not known by son        Medication List        Accurate as of May 12, 2021 11:30 AM. If you have any questions, ask your nurse or doctor.          STOP taking these medications    glipiZIDE 2.5 MG 24 hr tablet Commonly known as: GLUCOTROL XL Stopped by: Howard Pouch, DO       TAKE these medications    clopidogrel 75 MG tablet Commonly known as: PLAVIX Take 1 tablet (75 mg total) by mouth daily.   dorzolamide-timolol 22.3-6.8 MG/ML ophthalmic solution Commonly known as: COSOPT Place 1 drop into the right eye 2 (two) times daily.   hydrALAZINE 25 MG tablet Commonly known as: APRESOLINE Take 1 tablet (25 mg total) by mouth every 8 (eight) hours.   KRILL OIL PO Take 1 capsule by mouth daily.   lisinopril 40 MG tablet Commonly  known as: ZESTRIL Take 1 tablet (40 mg total) by mouth daily.   melatonin 5 MG Tabs Take 5 mg by mouth at bedtime as needed (for sleep).   metoprolol succinate 25 MG 24 hr tablet Commonly known as: TOPROL-XL Take 1 tablet (25 mg total) by mouth daily.   NON FORMULARY Take 2 capsules by mouth See admin instructions. UriVArx - Clinically Proven Bladder Control Capsules: Take 2 capsules by mouth once a [place]   Omega-3 300 MG Caps Take 1 tablet by mouth daily.   One-A-Dimaio Womens 50 Plus Tabs Take 1 tablet by mouth daily.   polyethylene glycol powder 17 GM/SCOOP powder Commonly known as: GLYCOLAX/MIRALAX Take 17 g by mouth daily as needed for mild constipation.   pravastatin 40 MG tablet Commonly known as: PRAVACHOL Take 1 tablet (40 mg total) by mouth daily.   Vitamin D-3 25 MCG (1000 UT) Caps Take 1,000 Units by mouth daily.   Vyzulta 0.024 % Soln Generic drug: Latanoprostene Bunod Place 1 drop into the right eye at bedtime.        All past medical history, surgical history, allergies, family history, immunizations and medications were updated in the EMR today and reviewed under the history and medication portions of their EMR.      ROS: Negative, with the exception of above mentioned in HPI  Objective:  BP (!) 177/77    Pulse 74    Temp 98 F (36.7 C) (Oral)    Ht 5\' 4"  (1.626 m)    Wt 142 lb 3.2 oz (64.5 kg)    LMP 03/21/1978 (Approximate)    SpO2 97%    BMI 24.41 kg/m  Body mass index is 24.41 kg/m. Physical Exam Vitals and nursing note reviewed.  Constitutional:      General: She is not in acute distress.    Appearance: Normal appearance. She is not ill-appearing, toxic-appearing or diaphoretic.  HENT:     Head: Normocephalic and atraumatic.  Eyes:     General: No scleral icterus.       Right eye: No discharge.        Left eye: No discharge.     Extraocular Movements: Extraocular movements intact.     Conjunctiva/sclera: Conjunctivae normal.     Pupils:  Pupils are equal, round, and reactive to light.  Cardiovascular:     Rate and Rhythm: Normal rate and regular rhythm.     Heart sounds: No murmur heard.   No friction rub. No  gallop.  Pulmonary:     Effort: Pulmonary effort is normal. No respiratory distress.     Breath sounds: Normal breath sounds. No wheezing, rhonchi or rales.  Musculoskeletal:     Cervical back: Neck supple. No tenderness.     Right lower leg: No edema.     Left lower leg: No edema.  Lymphadenopathy:     Cervical: No cervical adenopathy.  Skin:    General: Skin is warm and dry.     Coloration: Skin is not jaundiced or pale.     Findings: No erythema or rash.  Neurological:     Mental Status: She is alert and oriented to person, place, and time. Mental status is at baseline.     Motor: No weakness.     Gait: Gait normal.  Psychiatric:        Mood and Affect: Mood normal.        Behavior: Behavior normal.        Thought Content: Thought content normal.        Judgment: Judgment normal.     Assessment/Plan: Madison Martinez is a 88 y.o. female present for OV for  Stage 3 chronic kidney disease Avoid NSAIDs Renally dose meds when appropriate Cmp, Pth/ca and vit d  collected today Follow-up every 5.5 months  Monoclonal B-cell lymphocytosis: Patient has declined follow-up with heme-onc. Monitor CBC twice yearly Cbc collected today  Cerebrovascular accident (CVA) due to stenosis of left middle cerebral artery (HCC)/Aneurysm of internal carotid artery/small vessel disease/cerebral atrophy/abnormal EEG/chronic anticoagulation Continue Plavix indefinitely. Continue follow-ups with neurology   Essential hypertension/hyperlipidemia Above goal today.  Patient had accidentally stopped her lisinopril. Continue hydralazine 25 mg 3 times daily> she is ok with taking this every 8 hrs.   Restart lisinopril 40 Continue metoprolol daily Continue pravastatin 40 mg daily CBC, CMP, TSH and lipids collected  today Follow-up every 5.5 months . Aortic valve mass/Aortic atherosclerosis (HCC)/aortic calcification -Referral to cardiology was placed last visit  Elevated TSH: -has been very mildly  Elevated tsh. Elected to watch instead of starting med.  - tsh collected today  Elevated glucose/diet controlled DM: Diet controlled.  A1c collected today  Reviewed expectations re: course of current medical issues. Discussed self-management of symptoms. Outlined signs and symptoms indicating need for more acute intervention. Patient verbalized understanding and all questions were answered. Patient received an After-Visit Summary. Any changes in medications were reviewed and patient was provided with updated med list with their AVS.     No orders of the defined types were placed in this encounter.  No orders of the defined types were placed in this encounter.  Referral Orders  No referral(s) requested today    Note is dictated utilizing voice recognition software. Although note has been proof read prior to signing, occasional typographical errors still can be missed. If any questions arise, please do not hesitate to call for verification.   electronically signed by:  Howard Pouch, DO  [place]

## 2021-05-13 LAB — PTH, INTACT AND CALCIUM
Calcium: 9.6 mg/dL (ref 8.6–10.4)
PTH: 25 pg/mL (ref 16–77)

## 2021-06-09 ENCOUNTER — Encounter: Payer: Self-pay | Admitting: Family Medicine

## 2021-06-09 ENCOUNTER — Other Ambulatory Visit: Payer: Self-pay

## 2021-06-09 ENCOUNTER — Ambulatory Visit (INDEPENDENT_AMBULATORY_CARE_PROVIDER_SITE_OTHER): Payer: Medicare Other | Admitting: Family Medicine

## 2021-06-09 VITALS — BP 140/70 | HR 80 | Temp 97.8°F | Ht 64.0 in | Wt 140.4 lb

## 2021-06-09 DIAGNOSIS — I10 Essential (primary) hypertension: Secondary | ICD-10-CM | POA: Diagnosis not present

## 2021-06-09 DIAGNOSIS — E7800 Pure hypercholesterolemia, unspecified: Secondary | ICD-10-CM | POA: Diagnosis not present

## 2021-06-09 NOTE — Patient Instructions (Addendum)
Great to see you today.  I have refilled the medication(s) we provide.   If labs were collected, we will inform you of lab results once received either by echart message or telephone call.   - echart message- for normal results that have been seen by the patient already.   - telephone call: abnormal results or if patient has not viewed results in their echart.  

## 2021-06-09 NOTE — Progress Notes (Signed)
?  ? ?Madison Martinez , 04/12/1927, 88 y.o., female ?MRN: [id number] ?Patient Care Team  ?  Relationship Specialty Notifications Start End  ?Ma Hillock, DO PCP - General Family Medicine  09/01/16   ?Verdell Carmine, MD Referring Physician Internal Medicine  09/01/16   ?Renelda Loma, OD  Optometry  09/01/16   ?[place], [place]  Ophthalmology  06/16/17   ?Manuela Neptune, DPM Referring Physician Podiatry  06/16/17   ? ? ?Chief Complaint  ?Patient presents with  ? Hypertension  ?  Cmc; pt is not fasting  ? ?  ?Subjective:  ?Madison Martinez  is a 88 y.o. female presents for follow up on her htn. Pr was seen last  month with elevated BP above goal. It was found she had accidentally discontinued her lisinopril. Restarted lisinopril last visit. ?Essential hypertension/hyperlipidemia/CKD3/CVA/aortic atherosclerosis/chronic anticoagulation/CVA ?Pt reports compliance  with Metoprolol-XL 25 mg daily, hydralazine 25 mg TID and  lisinopril 40 mg qd. Patient denies chest pain, shortness of breath, dizziness or lower extremity edema.  ?She is taking statin. She reports compliance with Plavix.  She has followed with neurology status post stroke. ?Diet: Monitors her diet, low-sodium ?Exercise: Does not exercise much, walks with a cane ?RF: Hypertension, hyperlipidemia, family history of heart disease ?  ?Type 2 diabetes mellitus without complication, without long-term current use of insulin ([place])- Diet controlled.  ?Diet controlled now. Had been on glipizide low dose in the past ?PNA series: She has declined Pneumovax ?Flu shot: She declines flu shot (recommneded yearly) ?A1c: 6.6> 5.5 >5.2> 5.2 again today ? ?Monoclonal B-cell lymphocytosis ?She is suppose to follow with her heme/onc every 3 months- but has not in some time. CBC has been stable here and she would like to continue following cbc here.  ?She is due for labs. ? ?Elevated TSH: ?Last TSH 06/2019 just mildly elevated TSH.   ? ?EEG-05/15/2019 ?IMPRESSION: This study is  suggestive of non specific cortical dysfunction in left frontotemporal region. No seizures or definite epileptiform discharges were seen throughout the recording. Priyanka Barbra Sarks  ? ?MR ANGIO HEAD WO CONTRAST- 05/15/2019 ?IMPRESSION: 1. Two punctate subcentimeter acute ischemic nonhemorrhagic cortical infarcts involving the left frontoparietal region as above, likely embolic in nature. 2. Underlying moderately advanced cerebral atrophy with chronic small vessel ischemic disease. ? ?MR BRAIN WO CONTRAST- 05/15/2019 ?IMPRESSION: 1. Approximate 8 mm severe stenosis extending from the left carotid terminus into the left M1 segment. Left MCA branches are perfused distally. 2. 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 Short-segment approximate 60% right V4 stenosis as above. 3. Extensive atheromatous change throughout the carotid siphons with associated moderate to advanced multifocal stenoses. 4. 5 mm cavernous right ICA aneurysm . ? ?US RENAL-05/15/2019 ?Right Kidney: Renal measurements: 8.6 x 5.3 x 3.1 cm = volume: 73.8 mL . Echogenicity within normal limits. No worrisome mass, shadowing calculus or hydronephrosis visualized.  ?Left Kidney: Renal measurements: 9.4 x 5.3 x 3.6 cm = volume: 94 mL. 0.8 x 0.6 x 0.6 cm anechoic cystic lesion in the interpolar left kidney most compatible with simple renal cyst corresponding well to a partially exophytic cyst on CT. No worrisome renal lesion. No shadowing calculus or hydronephrosis.  ?Bladder: Appears normal for degree of bladder distention.  ?IMPRESSION: Anechoic 8 mm likely simple cyst in the left kidney. Otherwise unremarkable renal ultrasound.  ? ? ?ECHOCARDIOGRAM COMPLETE-05/15/2019 ?IMPRESSIONS  1. There is a mass (0.8 cm x 0.5 cm) that appears  to be attached to the right coronary cusp of the aortic valve. It does appear calcified and is best seen in the PLAX views. There is no destruction of the valve to suggest  vegetation/endocarditis. 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 A TEE is recommended for better characterization in this patient with stroke.         ? ?ECHO TEE- 05/17/2019 ?IMPRESSIONS   ?Normal LV function; moderate LAE; trace AI; mild MR and TR.   ?Left ventricular ejection fraction, by estimation, is 55 to 60%. The left ventricle has normal function. The left ventricle has no regional wall motion abnormalities.   ?Right ventricular systolic function is normal. The right ventricular size is normal. ?Aortic Valve: The aortic valve is tricuspid. Aortic valve regurgitation is trivial. Mild to moderate aortic valve sclerosis/calcification is present, without any evidence of aortic stenosis. ?Aorta: The aortic root is normal in size and structure. There is moderate (Grade III) plaque involving the descending aorta. ? ?05/20/2019-Carotid Arterial Duplex Study  ?Summary: ?Right Carotid: Velocities in the right ICA are consistent with a 1-39% stenosis. ?Left Carotid: Velocities in the left ICA are consistent with a 1-39% stenosis. Vertebrals: Bilateral vertebral arteries demonstrate antegrade flow. ? ? ? ?  08/12/2020  ? 10:39 AM 02/26/2020  ?  1:55 PM 07/10/2019  ? 11:57 AM 08/03/2018  ?  2:16 PM 08/03/2018  ?  2:13 PM  ?Depression screen PHQ 2/9  ?Decreased Interest 0 0 0 0 0  ?Down, Depressed, Hopeless 0 0 0 0 0  ?PHQ - 2 Score 0 0 0 0 0  ? ? ?Allergies  ?Allergen Reactions  ? Amlodipine Besylate Other (See Comments)  ?  Vertigo  ? Influenza Virus Vaccine Swelling  ?  Site of swelling not known by son  ? ?Social History  ? ?Tobacco Use  ? Smoking status: Never  ? Smokeless tobacco: Never  ?Substance Use Topics  ? Alcohol use: No  ? ?Past Medical History:  ?Diagnosis Date  ? Blind hypertensive eye, left 04/18/2012  ? Changing skin lesion 11/23/2018  ? CVA (cerebral vascular accident) [facility])   ? Gait disturbance 10/05/2015  ? pt declined neuro referral per records  ? Generalized weakness 05/15/2019  ?  Hard of hearing 12/02/2015  ? hearing aids  ? Hyperlipidemia   ? Hypertension   ? Insomnia   ? "tried melatonin, advil PM, xanax and klonopin without success"  ? Leukocytosis 11/07/2013  ? seen oncology, did not want to continue to follow there, so continued follow up at PCP.  ? Osteoporosis   ? Polyneuropathy   ? Primary open angle glaucoma 01/2011  ? Spinal stenosis   ? Stable branch retinal vein occlusion 01/06/2012  ? Type 2 diabetes mellitus ([place])   ? "diet controlled"  ? ?Past Surgical History:  ?Procedure Laterality Date  ? BUBBLE STUDY  05/17/2019  ? Procedure: BUBBLE STUDY;  Surgeon: Lelon Perla, MD;  Location: [place];  Service: Cardiovascular;;  ? EYE SURGERY    ? [place] SURGERY  2005  ? TEE WITHOUT CARDIOVERSION N/A 05/17/2019  ? Procedure: TRANSESOPHAGEAL ECHOCARDIOGRAM (TEE);  Surgeon: Lelon Perla, MD;  Location[facility] ENDOSCOPY;  Service: Cardiovascular;  Laterality: N/A;  ? TOTAL HIP ARTHROPLASTY Left 1991  ? TOTAL HIP ARTHROPLASTY Right ?  ? x 2  ? ?Family History  ?Problem Relation Age of Onset  ? Heart disease Brother   ? Cancer Other   ?     prostate  ?  Ovarian cancer Neg Hx   ? Breast cancer Neg Hx   ? Uterine cancer Neg Hx   ? Colon cancer Neg Hx   ? ?Allergies as of 06/09/2021   ? ?   Reactions  ? Amlodipine Besylate Other (See Comments)  ? Vertigo  ? Influenza Virus Vaccine Swelling  ? Site of swelling not known by son  ? ?  ? ?  ?Medication List  ?  ? ?  ? Accurate as of June 09, 2021  1:17 PM. If you have any questions, ask your nurse or doctor.  ?  ?  ? ?  ? ?clopidogrel 75 MG tablet ?Commonly known as: PLAVIX ?Take 1 tablet (75 mg total) by mouth daily. ?  ?dorzolamide-timolol 22.3-6.8 MG/ML ophthalmic solution ?Commonly known as: COSOPT ?Place 1 drop into the right eye 2 (two) times daily. ?  ?hydrALAZINE 25 MG tablet ?Commonly known as: APRESOLINE ?Take 1 tablet (25 mg total) by mouth every 8 (eight) hours. ?  ?lisinopril 40 MG tablet ?Commonly known as: ZESTRIL ?Take 1  tablet (40 mg total) by mouth daily. ?What changed: Another medication with the same name was removed. Continue taking this medication, and follow the directions you see here. ?Changed by: Howard Pouch, DO ?  ?melatonin 5 M

## 2021-06-22 ENCOUNTER — Other Ambulatory Visit: Payer: Self-pay | Admitting: Family Medicine

## 2021-07-20 ENCOUNTER — Other Ambulatory Visit: Payer: Self-pay | Admitting: Family Medicine

## 2021-08-18 ENCOUNTER — Ambulatory Visit (INDEPENDENT_AMBULATORY_CARE_PROVIDER_SITE_OTHER): Payer: Medicare Other

## 2021-08-18 DIAGNOSIS — Z0000 Encounter for general adult medical examination without abnormal findings: Secondary | ICD-10-CM

## 2021-08-18 NOTE — Patient Instructions (Signed)
Madison Martinez , Thank you for taking time to come for your Medicare Wellness Visit. I appreciate your ongoing commitment to your health goals. Please review the following plan we discussed and let me know if I can assist you in the future.   Screening recommendations/referrals: Colonoscopy: no longer required  Mammogram: No longer required  Bone Density: no longer required  Recommended yearly ophthalmology/optometry visit for glaucoma screening and checkup Recommended yearly dental visit for hygiene and checkup  Vaccinations: Influenza vaccine: not a candidate  Pneumococcal vaccine: declined  Tdap vaccine: Discontinued  Shingles vaccine: Shingrix discussed. Please contact your pharmacy for coverage information.    Covid-19:Declined   Advanced directives: Please bring a copy of your health care power of attorney and living will to the office at your convenience.  Conditions/risks identified: none at this time   Next appointment: Follow up in one year for your annual wellness visit    Preventive Care 65 Years and Older, Female Preventive care refers to lifestyle choices and visits with your health care provider that can promote health and wellness. What does preventive care include? A yearly physical exam. This is also called an annual well check. Dental exams once or twice a year. Routine eye exams. Ask your health care provider how often you should have your eyes checked. Personal lifestyle choices, including: Daily care of your teeth and gums. Regular physical activity. Eating a healthy diet. Avoiding tobacco and drug use. Limiting alcohol use. Practicing safe sex. Taking low-dose aspirin every [place]. Taking vitamin and mineral supplements as recommended by your health care provider. What happens during an annual well check? The services and screenings done by your health care provider during your annual well check will depend on your age, overall health, lifestyle risk factors, and  family history of disease. Counseling  Your health care provider may ask you questions about your: Alcohol use. Tobacco use. Drug use. Emotional well-being. Home and relationship well-being. Sexual activity. Eating habits. History of falls. Memory and ability to understand (cognition). Work and work Statistician. Reproductive health. Screening  You may have the following tests or measurements: Height, weight, and BMI. Blood pressure. Lipid and cholesterol levels. These may be checked every 5 years, or more frequently if you are over 86 years old. Skin check. Lung cancer screening. You may have this screening every year starting at age 86 if you have a 30-pack-year history of smoking and currently smoke or have quit within the past 15 years. Fecal occult blood test (FOBT) of the stool. You may have this test every year starting at age 86. Flexible sigmoidoscopy or colonoscopy. You may have a sigmoidoscopy every 5 years or a colonoscopy every 10 years starting at age 64. Hepatitis C blood test. Hepatitis B blood test. Sexually transmitted disease (STD) testing. Diabetes screening. This is done by checking your blood sugar (glucose) after you have not eaten for a while (fasting). You may have this done every 1-3 years. Bone density scan. This is done to screen for osteoporosis. You may have this done starting at age 86. Mammogram. This may be done every 1-2 years. Talk to your health care provider about how often you should have regular mammograms. Talk with your health care provider about your test results, treatment options, and if necessary, the need for more tests. Vaccines  Your health care provider may recommend certain vaccines, such as: Influenza vaccine. This is recommended every year. Tetanus, diphtheria, and acellular pertussis (Tdap, Td) vaccine. You may need a Td booster  every 10 years. Zoster vaccine. You may need this after age 86. Pneumococcal 13-valent conjugate  (PCV13) vaccine. One dose is recommended after age 86. Pneumococcal polysaccharide (PPSV23) vaccine. One dose is recommended after age 86. Talk to your health care provider about which screenings and vaccines you need and how often you need them. This information is not intended to replace advice given to you by your health care provider. Make sure you discuss any questions you have with your health care provider. Document Released: 04/03/2015 Document Revised: 11/25/2015 Document Reviewed: 01/06/2015 Elsevier Interactive Patient Education  2017 [place] Prevention in the Home Falls can cause injuries. They can happen to people of all ages. There are many things you can do to make your home safe and to help prevent falls. What can I do on the outside of my home? Regularly fix the edges of walkways and driveways and fix any cracks. Remove anything that might make you trip as you walk through a door, such as a raised step or threshold. Trim any bushes or trees on the path to your home. Use bright outdoor lighting. Clear any walking paths of anything that might make someone trip, such as rocks or tools. Regularly check to see if handrails are loose or broken. Make sure that both sides of any steps have handrails. Any raised decks and porches should have guardrails on the edges. Have any leaves, snow, or ice cleared regularly. Use sand or salt on walking paths during winter. Clean up any spills in your garage right away. This includes oil or grease spills. What can I do in the bathroom? Use night lights. Install grab bars by the toilet and in the tub and shower. Do not use towel bars as grab bars. Use non-skid mats or decals in the tub or shower. If you need to sit down in the shower, use a plastic, non-slip stool. Keep the floor dry. Clean up any water that spills on the floor as soon as it happens. Remove soap buildup in the tub or shower regularly. Attach bath mats securely with  double-sided non-slip rug tape. Do not have throw rugs and other things on the floor that can make you trip. What can I do in the bedroom? Use night lights. Make sure that you have a light by your bed that is easy to reach. Do not use any sheets or blankets that are too big for your bed. They should not hang down onto the floor. Have a firm chair that has side arms. You can use this for support while you get dressed. Do not have throw rugs and other things on the floor that can make you trip. What can I do in the kitchen? Clean up any spills right away. Avoid walking on wet floors. Keep items that you use a lot in easy-to-reach places. If you need to reach something above you, use a strong step stool that has a grab bar. Keep electrical cords out of the way. Do not use floor polish or wax that makes floors slippery. If you must use wax, use non-skid floor wax. Do not have throw rugs and other things on the floor that can make you trip. What can I do with my stairs? Do not leave any items on the stairs. Make sure that there are handrails on both sides of the stairs and use them. Fix handrails that are broken or loose. Make sure that handrails are as long as the stairways. Check any carpeting to  make sure that it is firmly attached to the stairs. Fix any carpet that is loose or worn. Avoid having throw rugs at the top or bottom of the stairs. If you do have throw rugs, attach them to the floor with carpet tape. Make sure that you have a light switch at the top of the stairs and the bottom of the stairs. If you do not have them, ask someone to add them for you. What else can I do to help prevent falls? Wear shoes that: Do not have high heels. Have rubber bottoms. Are comfortable and fit you well. Are closed at the toe. Do not wear sandals. If you use a stepladder: Make sure that it is fully opened. Do not climb a closed stepladder. Make sure that both sides of the stepladder are locked  into place. Ask someone to hold it for you, if possible. Clearly mark and make sure that you can see: Any grab bars or handrails. First and last steps. Where the edge of each step is. Use tools that help you move around (mobility aids) if they are needed. These include: Canes. Walkers. Scooters. Crutches. Turn on the lights when you go into a dark area. Replace any light bulbs as soon as they burn out. Set up your furniture so you have a clear path. Avoid moving your furniture around. If any of your floors are uneven, fix them. If there are any pets around you, be aware of where they are. Review your medicines with your doctor. Some medicines can make you feel dizzy. This can increase your chance of falling. Ask your doctor what other things that you can do to help prevent falls. This information is not intended to replace advice given to you by your health care provider. Make sure you discuss any questions you have with your health care provider. Document Released: 01/01/2009 Document Revised: 08/13/2015 Document Reviewed: 04/11/2014 Elsevier Interactive Patient Education  2017 [employer].

## 2021-08-18 NOTE — Progress Notes (Signed)
Virtual Visit via Telephone Note  I connected with  Madison Martinez on 08/18/21 at 11:00 AM EDT by telephone and verified that I am speaking with the correct person using two identifiers.  Medicare Annual Wellness visit completed telephonically due to Covid-19 pandemic.   Persons participating in this call: This Health Coach and this patient.   Location: Patient: home Provider: office   I discussed the limitations, risks, security and privacy concerns of performing an evaluation and management service by telephone and the availability of in person appointments. The patient expressed understanding and agreed to proceed.  Unable to perform video visit due to video visit attempted and failed and/or patient does not have video capability.   Some vital signs may be absent or patient reported.   Willette Brace, LPN   Subjective:   Madison Martinez is a 88 y.o. female who presents for Medicare Annual (Subsequent) preventive examination.  Review of Systems     Cardiac Risk Factors include: advanced age (>61mn, >>74women);hypertension;dyslipidemia     Objective:    There were no vitals filed for this visit. There is no height or weight on file to calculate BMI.     08/18/2021   10:53 AM 08/12/2020   10:36 AM 11/04/2019    2:50 PM 05/14/2019    8:46 PM 08/03/2018    2:12 PM 06/16/2017   11:06 AM  Advanced Directives  Does Patient Have a Medical Advance Directive? Yes Yes No No No Yes  [place]    [place] Chart? No - copy requested No - copy requested    No - copy requested  Would patient like information on creating a medical advance directive?   No - Patient declined No - Patient declined      Current Medications (verified) Outpatient Encounter Medications as of 08/18/2021  Medication Sig   Cholecalciferol (VITAMIN D-3) 25 MCG (1000 UT)  CAPS Take 1,000 Units by mouth daily.   clopidogrel (PLAVIX) 75 MG tablet Take 1 tablet (75 mg total) by mouth daily.   dorzolamide-timolol (COSOPT) 22.3-6.8 MG/ML ophthalmic solution Place 1 drop into the right eye 2 (two) times daily.   hydrALAZINE (APRESOLINE) 25 MG tablet Take 1 tablet (25 mg total) by mouth every 8 (eight) hours.   lisinopril (ZESTRIL) 40 MG tablet Take 1 tablet (40 mg total) by mouth daily.   Melatonin 5 MG TABS Take 5 mg by mouth at bedtime as needed (for sleep).   metoprolol succinate (TOPROL-XL) 25 MG 24 hr tablet Take 1 tablet (25 mg total) by mouth daily.   Multiple Vitamins-Minerals (ONE-A-[place] WOMENS 50 PLUS) TABS Take 1 tablet by mouth daily.   NON FORMULARY Take 2 capsules by mouth See admin instructions. UriVArx - Clinically Proven Bladder Control Capsules: Take 2 capsules by mouth once a [place]   Omega-3 300 MG CAPS Take 1 tablet by mouth daily.   polyethylene glycol powder (GLYCOLAX/MIRALAX) 17 GM/SCOOP powder Take 17 g by mouth daily as needed for mild constipation.   pravastatin (PRAVACHOL) 40 MG tablet Take 1 tablet (40 mg total) by mouth daily.   VYZULTA 0.024 % SOLN Place 1 drop into the right eye at bedtime.   No facility-administered encounter medications on file as of 08/18/2021.    Allergies (verified) Amlodipine besylate and Influenza virus vaccine   History: Past Medical History:  Diagnosis Date  Blind hypertensive eye, left 04/18/2012   Changing skin lesion 11/23/2018   CVA (cere[facility] vascular [facility]) [facility])    Gait disturbance 10/05/2015   pt declined neuro referral per records   Generalized weakness 05/15/2019   Hard of hearing 12/02/2015   hearing aids   Hyperlipidemia    Hypertension    Insomnia    "tried melatonin, advil PM, xanax and klonopin without success"   Leukocytosis 11/07/2013   seen oncology, did not want to continue to follow there, so continued follow up at PCP.   Osteoporosis    Polyneuropathy    Primary open angle  glaucoma 01/2011   Spinal stenosis    Stable branch retinal vein occlusion 01/06/2012   Type 2 diabetes mellitus ([place])    "diet controlled"   Past Surgical History:  Procedure Laterality Date   BUBBLE STUDY  05/17/2019   Procedure: BUBBLE STUDY;  Surgeon: Lelon Perla, MD;  Location: [facility] ENDOSCOPY;  Service: Cardiovascular;;   [place]  2005   TEE WITHOUT CARDIOVERSION N/A 05/17/2019   Procedure: TRANSESOPHAGEAL ECHOCARDIOGRAM (TEE);  Surgeon: Lelon Perla, MD;  Location: [facility] ENDOSCOPY;  Service: Cardiovascular;  Laterality: N/A;   TOTAL HIP ARTHROPLASTY Left 1991   TOTAL HIP ARTHROPLASTY Right ?   x 2   Family History  Problem Relation Age of Onset   Heart disease Brother    Cancer Other        prostate   Ovarian cancer Neg Hx    Breast cancer Neg Hx    Uterine cancer Neg Hx    Colon cancer Neg Hx    Social History   Socioeconomic History   Marital status: Divorced    Spouse name: Not on file   Number of children: 3   Years of education: 12   Highest education level: Not on file  Occupational History   Not on file  Tobacco Use   Smoking status: Never   Smokeless tobacco: Never  Vaping Use   Vaping Use: Never used  Substance and Sexual Activity   Alcohol use: No   Drug use: No   Sexual activity: Never    Birth control/protection: Post-menopausal  Other Topics Concern   Not on file  Social History Narrative   Divorced. 3 children [name], Max and [name]. Lives alone.   High school graduate.   Takes a daily vitamin.   Wears her seatbelt. Wears a hearing aid. Wears dentures.   Requires a cane for assistive device for walking.   Smoke detector in the home.   Feels safe in her relationships.   Social Determinants of Health   Financial Resource Strain: Low Risk    Difficulty of Paying Living Expenses: Not hard at all  Food Insecurity: No Food Insecurity   Worried About Charity fundraiser in the Last Year: Never true   [place] in the Last Year: Never true  Transportation Needs: No Transportation Needs   Lack of Transportation (Medical): No   Lack of Transportation (Non-Medical): No  Physical Activity: Sufficiently Active   Days of Exercise per Week: 7 days   Minutes of Exercise per Session: 30 min  Stress: No Stress Concern Present   Feeling of Stress : Not at all  Social Connections: Socially Isolated   Frequency of Communication with Friends and Family: More than three times a week   Frequency of Social Gatherings with Friends and Family: More than three times a week  Attends Religious Services: Never   Active Member of Clubs or Organizations: No   Attends Archivist Meetings: Never   Marital Status: Divorced    Tobacco Counseling Counseling given: Not Answered   Clinical Intake:  Pre-visit preparation completed: Yes  Pain : No/denies pain     BMI - recorded: 24.1 Nutritional Status: BMI of 19-24  Normal Nutritional Risks: None Diabetes: Yes CBG done?: No Did pt. bring in CBG monitor from home?: No  How often do you need to have someone help you when you read instructions, pamphlets, or other written materials from your doctor or pharmacy?: 1 - Never  Diabetic?Nutrition Risk Assessment:  Has the patient had any N/V/D within the last 2 months?  No  Does the patient have any non-healing wounds?  No  Has the patient had any unintentional weight loss or weight gain?  No   Diabetes:  Is the patient diabetic?  Yes  If diabetic, was a CBG obtained today?  No  Did the patient bring in their glucometer from home?  No  How often do you monitor your CBG's? Pt stated she is not a diabetic.   Financial Strains and Diabetes Management:  Are you having any financial strains with the device, your supplies or your medication? No .  Does the patient want to be seen by Chronic Care Management for management of their diabetes?  No  Would the patient like to be referred to a  Nutritionist or for Diabetic Management?  No   Diabetic Exams:   Diabetic Eye Exam: Overdue for diabetic eye exam. Pt has been advised about the importance in completing this exam. Patient advised to call and schedule an eye exam. Diabetic Foot Exam: Completed 05/12/21   Interpreter Needed?: No  Information entered by :: Charlott Rakes, LPN   Activities of Daily Living    08/18/2021   10:54 AM  In your present state of health, do you have any difficulty performing the following activities:  Hearing? 1  Comment hearing aids  Vision? 0  Difficulty concentrating or making decisions? 0  Walking or climbing stairs? 1  Comment don't use stairs  Dressing or bathing? 0  Doing errands, shopping? 0  Preparing Food and eating ? N  Using the Toilet? N  In the past six months, have you accidently leaked urine? Y  Do you have problems with loss of bowel control? N  Managing your Medications? N  Managing your Finances? N  Comment son assist  Housekeeping or managing your Housekeeping? N  Comment son assist    Patient Care Team: Ma Hillock, DO as PCP - General (Family Medicine) Verdell Carmine, MD as Referring Physician (Internal Medicine) Renelda Loma, OD (Optometry) [facility] [place] (Ophthalmology) Manuela Neptune, DPM as Referring Physician (Podiatry)  Indicate any recent Medical Services you may have received from other than [facility] providers in the past year (date may be approximate).     Assessment:   This is a routine wellness examination for Madison Martinez.  Hearing/Vision screen Hearing Screening - Comments:: Pt wears hearing aids  Vision Screening - Comments:: Pt follows up with Dr Brigitte Pulse for annual eye exams   Dietary issues and exercise activities discussed: Current Exercise Habits: Home exercise routine, Type of exercise: Other - see comments, Time (Minutes): 30, Frequency (Times/Week): 7, Weekly Exercise (Minutes/Week): 210   Goals Addressed              This Visit's Progress    Patient Stated  None at times        Depression Screen    08/18/2021   10:52 AM 08/12/2020   10:39 AM 02/26/2020    1:55 PM 07/10/2019   11:57 AM 08/03/2018    2:16 PM 08/03/2018    2:13 PM 05/25/2018    9:34 AM  PHQ 2/9 Scores  PHQ - 2 Score 0 0 0 0 0 0 0    Fall Risk    08/18/2021   10:53 AM 08/12/2020   10:38 AM 02/26/2020    1:55 PM 06/27/2019    9:56 AM 08/03/2018    2:16 PM  Fall Risk   Falls in the past year? 0 0 0 0 0  Number falls in past yr: 0 0 0    Injury with Fall? 0 0 0    Follow up Falls prevention discussed Falls prevention discussed  Falls evaluation completed     FALL RISK PREVENTION PERTAINING TO THE HOME:  Any stairs in or around the home? No  If so, are there any without handrails? No  Home free of loose throw rugs in walkways, pet beds, electrical cords, etc? Yes  Adequate lighting in your home to reduce risk of falls? Yes   ASSISTIVE DEVICES UTILIZED TO PREVENT FALLS:  Life alert? No  Use of a cane, walker or w/c? Yes  Grab bars in the bathroom? Yes  Shower chair or bench in shower? Yes  Elevated toilet seat or a handicapped toilet? Yes   TIMED UP AND GO:  Was the test performed? No .   Cognitive Function:pt HOH alert and oriented to questions asked     06/16/2017   11:08 AM  MMSE - Mini Mental State Exam  Orientation to time 4  Orientation to Place 5  Registration 3  Attention/ Calculation 2  Recall 1  Language- name 2 objects 2  Language- repeat 1  Language- follow 3 step command 3  Language- read & follow direction 1  Write a sentence 1  Copy design 1  Total score 24        Immunizations  There is no immunization history on file for this patient.    Flu Vaccine status: Declined, Education has been provided regarding the importance of this vaccine but patient still declined. Advised may receive this vaccine at local pharmacy or Health Dept. Aware to provide a copy of the vaccination record if  obtained from local pharmacy or Health Dept. Verbalized acceptance and understanding.  Pneumococcal vaccine status: Declined,  Education has been provided regarding the importance of this vaccine but patient still declined. Advised may receive this vaccine at local pharmacy or Health Dept. Aware to provide a copy of the vaccination record if obtained from local pharmacy or Health Dept. Verbalized acceptance and understanding.   Covid-19 vaccine status: Declined, Education has been provided regarding the importance of this vaccine but patient still declined. Advised may receive this vaccine at local pharmacy or Health Dept.or vaccine clinic. Aware to provide a copy of the vaccination record if obtained from local pharmacy or Health Dept. Verbalized acceptance and understanding.  Qualifies for Shingles Vaccine? Yes   Zostavax completed No   Shingrix Completed?: No.    Education has been provided regarding the importance of this vaccine. Patient has been advised to call insurance company to determine out of pocket expense if they have not yet received this vaccine. Advised may also receive vaccine at local pharmacy or Health Dept. Verbalized acceptance and understanding.  Screening Tests  Health Maintenance  Topic Date Due   OPHTHALMOLOGY EXAM  08/19/2020   Pneumonia Vaccine 80+ Years old (1 - PCV) 05/12/2024 (Originally 08/03/1992)   Zoster Vaccines- Shingrix (1 of 2) 08/09/2024 (Originally 08/02/1946)   HEMOGLOBIN A1C  11/09/2021   FOOT EXAM  05/12/2022   HPV VACCINES  Aged Out   DEXA SCAN  Discontinued   TETANUS/TDAP  Discontinued   COVID-19 Vaccine  Discontinued    Health Maintenance  Health Maintenance Due  Topic Date Due   OPHTHALMOLOGY EXAM  08/19/2020    Colorectal cancer screening: No longer required.   Mammogram status: No longer required due to age.    Additional Screening:  Vision Screening: Recommended annual ophthalmology exams for early detection of glaucoma and other  disorders of the eye. Is the patient up to date with their annual eye exam?  Yes  Who is the provider or what is the name of the office in which the patient attends annual eye exams? Dr Brigitte Pulse If pt is not established with a provider, would they like to be referred to a provider to establish care? No .   Dental Screening: Recommended annual dental exams for proper oral hygiene  Community Resource Referral / Chronic Care Management: CRR required this visit?  No   CCM required this visit?  No      Plan:     I have personally reviewed and noted the following in the patient's chart:   Medical and social history Use of alcohol, tobacco or illicit drugs  Current medications and supplements including opioid prescriptions.  Functional ability and status Nutritional status Physical activity Advanced directives List of other physicians Hospitalizations, surgeries, and ER visits in previous 12 months Vitals Screenings to include cognitive, depression, and falls Referrals and appointments  In addition, I have reviewed and discussed with patient certain preventive protocols, quality metrics, and best practice recommendations. A written personalized care plan for preventive services as well as general preventive health recommendations were provided to patient.     Willette Brace, LPN   [id number]   Nurse Notes: None

## 2021-09-06 ENCOUNTER — Other Ambulatory Visit: Payer: Self-pay | Admitting: Family Medicine

## 2021-10-07 ENCOUNTER — Other Ambulatory Visit: Payer: Self-pay | Admitting: Family Medicine

## 2021-10-20 DIAGNOSIS — H401111 Primary open-angle glaucoma, right eye, mild stage: Secondary | ICD-10-CM | POA: Diagnosis not present

## 2021-10-21 DIAGNOSIS — E782 Mixed hyperlipidemia: Secondary | ICD-10-CM | POA: Diagnosis not present

## 2021-10-21 DIAGNOSIS — Z8673 Personal history of transient ischemic attack (TIA), and cerebral infarction without residual deficits: Secondary | ICD-10-CM | POA: Diagnosis not present

## 2021-10-21 DIAGNOSIS — I10 Essential (primary) hypertension: Secondary | ICD-10-CM | POA: Diagnosis not present

## 2021-11-09 ENCOUNTER — Ambulatory Visit (INDEPENDENT_AMBULATORY_CARE_PROVIDER_SITE_OTHER): Payer: Medicare Other | Admitting: Family Medicine

## 2021-11-09 ENCOUNTER — Encounter: Payer: Self-pay | Admitting: Family Medicine

## 2021-11-09 ENCOUNTER — Telehealth: Payer: Self-pay | Admitting: Family Medicine

## 2021-11-09 ENCOUNTER — Telehealth: Payer: Self-pay

## 2021-11-09 VITALS — BP 114/61 | HR 80 | Temp 98.1°F | Ht 64.0 in | Wt 133.0 lb

## 2021-11-09 DIAGNOSIS — H35039 Hypertensive retinopathy, unspecified eye: Secondary | ICD-10-CM

## 2021-11-09 DIAGNOSIS — D72820 Lymphocytosis (symptomatic): Secondary | ICD-10-CM | POA: Diagnosis not present

## 2021-11-09 DIAGNOSIS — M810 Age-related osteoporosis without current pathological fracture: Secondary | ICD-10-CM

## 2021-11-09 DIAGNOSIS — N1832 Chronic kidney disease, stage 3b: Secondary | ICD-10-CM

## 2021-11-09 DIAGNOSIS — I700 Atherosclerosis of aorta: Secondary | ICD-10-CM | POA: Diagnosis not present

## 2021-11-09 DIAGNOSIS — I739 Peripheral vascular disease, unspecified: Secondary | ICD-10-CM | POA: Diagnosis not present

## 2021-11-09 DIAGNOSIS — G319 Degenerative disease of nervous system, unspecified: Secondary | ICD-10-CM

## 2021-11-09 DIAGNOSIS — I10 Essential (primary) hypertension: Secondary | ICD-10-CM | POA: Diagnosis not present

## 2021-11-09 DIAGNOSIS — E119 Type 2 diabetes mellitus without complications: Secondary | ICD-10-CM | POA: Diagnosis not present

## 2021-11-09 DIAGNOSIS — Z7901 Long term (current) use of anticoagulants: Secondary | ICD-10-CM | POA: Diagnosis not present

## 2021-11-09 DIAGNOSIS — I671 Cerebral aneurysm, nonruptured: Secondary | ICD-10-CM | POA: Diagnosis not present

## 2021-11-09 DIAGNOSIS — I63512 Cerebral infarction due to unspecified occlusion or stenosis of left middle cerebral artery: Secondary | ICD-10-CM

## 2021-11-09 DIAGNOSIS — E7800 Pure hypercholesterolemia, unspecified: Secondary | ICD-10-CM | POA: Diagnosis not present

## 2021-11-09 LAB — CBC WITH DIFFERENTIAL/PLATELET
Basophils Absolute: 0.1 10*3/uL (ref 0.0–0.1)
Basophils Relative: 0.9 % (ref 0.0–3.0)
Eosinophils Absolute: 0.1 10*3/uL (ref 0.0–0.7)
Eosinophils Relative: 1.7 % (ref 0.0–5.0)
HCT: 21.9 % — CL (ref 36.0–46.0)
Hemoglobin: 6.8 g/dL — CL (ref 12.0–15.0)
Lymphocytes Relative: 33.8 % (ref 12.0–46.0)
Lymphs Abs: 2.8 10*3/uL (ref 0.7–4.0)
MCHC: 31 g/dL (ref 30.0–36.0)
MCV: 72.9 fl — ABNORMAL LOW (ref 78.0–100.0)
Monocytes Absolute: 0.5 10*3/uL (ref 0.1–1.0)
Monocytes Relative: 6.5 % (ref 3.0–12.0)
Neutro Abs: 4.8 10*3/uL (ref 1.4–7.7)
Neutrophils Relative %: 57.1 % (ref 43.0–77.0)
Platelets: 265 10*3/uL (ref 150.0–400.0)
RBC: 3.01 Mil/uL — ABNORMAL LOW (ref 3.87–5.11)
RDW: 17.8 % — ABNORMAL HIGH (ref 11.5–15.5)
WBC: 8.3 10*3/uL (ref 4.0–10.5)

## 2021-11-09 LAB — COMPREHENSIVE METABOLIC PANEL
ALT: 7 U/L (ref 0–35)
AST: 16 U/L (ref 0–37)
Albumin: 4.3 g/dL (ref 3.5–5.2)
Alkaline Phosphatase: 57 U/L (ref 39–117)
BUN: 18 mg/dL (ref 6–23)
CO2: 25 mEq/L (ref 19–32)
Calcium: 9.7 mg/dL (ref 8.4–10.5)
Chloride: 104 mEq/L (ref 96–112)
Creatinine, Ser: 1.13 mg/dL (ref 0.40–1.20)
GFR: 42.31 mL/min — ABNORMAL LOW (ref >60.00)
Glucose, Bld: 131 mg/dL — ABNORMAL HIGH (ref 70–99)
Potassium: 4 mEq/L (ref 3.5–5.1)
Sodium: 140 mEq/L (ref 135–145)
Total Bilirubin: 0.4 mg/dL (ref 0.2–1.2)
Total Protein: 6.9 g/dL (ref 6.0–8.3)

## 2021-11-09 LAB — HEMOGLOBIN A1C: Hgb A1c MFr Bld: 6.6 % — ABNORMAL HIGH (ref 4.6–6.5)

## 2021-11-09 MED ORDER — HYDRALAZINE HCL 25 MG PO TABS: 25.0000 mg | ORAL_TABLET | Freq: Three times a day (TID) | ORAL | 5 refills | Status: DC

## 2021-11-09 MED ORDER — LISINOPRIL 40 MG PO TABS: 40.0000 mg | ORAL_TABLET | Freq: Every day | ORAL | 1 refills | Status: DC

## 2021-11-09 MED ORDER — FEXOFENADINE HCL 60 MG PO TABS
60.0000 mg | ORAL_TABLET | Freq: Two times a day (BID) | ORAL | 5 refills | Status: DC
Start: 2021-11-09 — End: 2022-09-02

## 2021-11-09 MED ORDER — METOPROLOL SUCCINATE ER 25 MG PO TB24: 25.0000 mg | ORAL_TABLET | Freq: Every day | ORAL | 1 refills | Status: DC

## 2021-11-09 MED ORDER — CLOPIDOGREL BISULFATE 75 MG PO TABS: 75.0000 mg | ORAL_TABLET | Freq: Every day | ORAL | 1 refills | Status: DC

## 2021-11-09 MED ORDER — PRAVASTATIN SODIUM 40 MG PO TABS: 40.0000 mg | ORAL_TABLET | Freq: Every day | ORAL | 3 refills | Status: DC

## 2021-11-09 NOTE — Patient Instructions (Signed)
No follow-ups on file.        Great to see you today.  I have refilled the medication(s) we provide.   If labs were collected, we will inform you of lab results once received either by echart message or telephone call.   - echart message- for normal results that have been seen by the patient already.   - telephone call: abnormal results or if patient has not viewed results in their echart.  

## 2021-11-09 NOTE — Progress Notes (Unsigned)
Madison Martinez , 1928/02/12, 88 y.o., female MRN: [id number] Patient Care Team    Relationship Specialty Notifications Start End  Ma Hillock, DO PCP - General Family Medicine  09/01/16   Verdell Carmine, MD Referring Physician Internal Medicine  09/01/16   [facility], [facility]  Ophthalmology  06/16/17   Manuela Neptune, DPM Referring Physician Podiatry  06/16/17   Danice Goltz, MD Consulting Physician Ophthalmology  11/09/21     Chief Complaint  Patient presents with   Hypertension    Cmc; pt is not fasting     Subjective:  Madison Martinez  is a 88 y.o. female presents for follow up on her medical conditions with her son. Essential hypertension/hyperlipidemia/CKD3/CVA/aortic atherosclerosis/chronic anticoagulation/CVA Pt reports c science with Metoprolol-XL 25 mg daily, hydralazine 25 mg TID and  lisinopril 40 mg qd. Patient denies chest pain, shortness of breath, dizziness or lower extremity edema.  She is taking statin. She reports compliance with Plavix.  She has followed with neurology status post stroke. Diet: Monitors her diet, low-sodium Exercise: Does not exercise much, walks with a cane RF: Hypertension, hyperlipidemia, family history of heart disease   Type 2 diabetes mellitus without complication, without long-term current use of insulin ([place])- Diet controlled.  Diet controlled now. Had been on glipizide low dose in the past .  Monoclonal B-cell lymphocytosis She is suppose to follow with her heme/onc every 3 months- but has not in some time. CBC has been stable here every 6 months. Elevated TSH: TSH has been mildly elevated intermittently.  No medications were started as of yet.  Patient reports today that she has noticed some weakness in her bilateral lower extremities.  She walks at home with a walker and able to ambulate on her own.  Her son states she is a little unsteady in her gait.  EEG-05/15/2019 IMPRESSION: This study is suggestive of non specific  cortical dysfunction in left frontotemporal region. No seizures or definite epileptiform discharges were seen throughout the recording. [place]   MR ANGIO HEAD WO CONTRAST- 05/15/2019 IMPRESSION: 1. Two punctate subcentimeter acute ischemic nonhemorrhagic cortical infarcts involving the left frontoparietal region as above, likely embolic in nature. 2. Underlying moderately advanced cerebral atrophy with chronic small vessel ischemic disease.  MR BRAIN WO CONTRAST- 05/15/2019 IMPRESSION: 1. Approximate 8 mm severe stenosis extending from the left carotid terminus into the left M1 segment. Left MCA branches are perfused distally. 2. 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 Short-segment approximate 60% right V4 stenosis as above. 3. Extensive atheromatous change throughout the carotid siphons with associated moderate to advanced multifocal stenoses. 4. 5 mm cavernous right ICA aneurysm .  US RENAL-05/15/2019 Right Kidney: Renal measurements: 8.6 x 5.3 x 3.1 cm = volume: 73.8 mL . Echogenicity within normal limits. No worrisome mass, shadowing calculus or hydronephrosis visualized.  Left Kidney: Renal measurements: 9.4 x 5.3 x 3.6 cm = volume: 94 mL. 0.8 x 0.6 x 0.6 cm anechoic cystic lesion in the interpolar left kidney most compatible with simple renal cyst corresponding well to a partially exophytic cyst on CT. No worrisome renal lesion. No shadowing calculus or hydronephrosis.  Bladder: Appears normal for degree of bladder distention.  IMPRESSION: Anechoic 8 mm likely simple cyst in the left kidney. Otherwise unremarkable renal ultrasound.    ECHOCARDIOGRAM COMPLETE-05/15/2019 IMPRESSIONS  1. There is a mass (0.8 cm x 0.5 cm) that appears to be attached to the  right coronary cusp of the aortic valve. It does appear calcified and is best seen in the PLAX views. There is no destruction of the valve to suggest vegetation/endocarditis. 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 A TEE is recommended for better characterization in this patient with stroke.          ECHO TEE- 05/17/2019 IMPRESSIONS   Normal LV function; moderate LAE; trace AI; mild MR and TR.   Left ventricular ejection fraction, by estimation, is 55 to 60%. The left ventricle has normal function. The left ventricle has no regional wall motion abnormalities.   Right ventricular systolic function is normal. The right ventricular size is normal. Aortic Valve: The aortic valve is tricuspid. Aortic valve regurgitation is trivial. Mild to moderate aortic valve sclerosis/calcification is present, without any evidence of aortic stenosis. Aorta: The aortic root is normal in size and structure. There is moderate (Grade III) plaque involving the descending aorta.  05/20/2019-Carotid Arterial Duplex Study  Summary: Right Carotid: Velocities in the right ICA are consistent with a 1-39% stenosis. Left Carotid: Velocities in the left ICA are consistent with a 1-39% stenosis. Vertebrals: Bilateral vertebral arteries demonstrate antegrade flow.      08/18/2021   10:52 AM 08/12/2020   10:39 AM 02/26/2020    1:55 PM 07/10/2019   11:57 AM 08/03/2018    2:16 PM  Depression screen PHQ 2/9  Decreased Interest 0 0 0 0 0  Down, Depressed, Hopeless 0 0 0 0 0  PHQ - 2 Score 0 0 0 0 0    Allergies  Allergen Reactions   Amlodipine Besylate Other (See Comments)    Vertigo   Influenza Virus Vaccine Swelling    Site of swelling not known by son   Social History   Tobacco Use   Smoking status: Never   Smokeless tobacco: Never  Substance Use Topics   Alcohol use: No   Past Medical History:  Diagnosis Date   Blind hypertensive eye, left 04/18/2012   Changing skin lesion 11/23/2018   CVA (cerebral vascular accident) [facility])    Gait disturbance 10/05/2015   pt declined neuro referral per records   Generalized weakness 05/15/2019   Hard of hearing 12/02/2015   hearing  aids   Hyperlipidemia    Hypertension    Insomnia    "tried melatonin, advil PM, xanax and klonopin without success"   Leukocytosis 11/07/2013   seen oncology, did not want to continue to follow there, so continued follow up at PCP.   Osteoporosis    Polyneuropathy    Primary open angle glaucoma 01/2011   Spinal stenosis    Stable branch retinal vein occlusion 01/06/2012   Type 2 diabetes mellitus ([place])    "diet controlled"   Past Surgical History:  Procedure Laterality Date   BUBBLE STUDY  05/17/2019   Procedure: BUBBLE STUDY;  Surgeon: Lelon Perla, MD;  Location: [facility] ENDOSCOPY;  Service: Cardiovascular;;   [place]  2005   TEE WITHOUT CARDIOVERSION N/A 05/17/2019   Procedure: TRANSESOPHAGEAL ECHOCARDIOGRAM (TEE);  Surgeon: Lelon Perla, MD;  Location: [facility] ENDOSCOPY;  Service: Cardiovascular;  Laterality: N/A;   TOTAL HIP ARTHROPLASTY Left 1991   TOTAL HIP ARTHROPLASTY Right ?   x 2   Family History  Problem Relation Age of Onset   Heart disease Brother    Cancer Other        prostate   Ovarian cancer Neg Hx  Breast cancer Neg Hx    Uterine cancer Neg Hx    Colon cancer Neg Hx    Allergies as of 11/09/2021       Reactions   Amlodipine Besylate Other (See Comments)   Vertigo   Influenza Virus Vaccine Swelling   Site of swelling not known by son        Medication List        Accurate as of November 09, 2021 11:59 PM. If you have any questions, ask your nurse or doctor.          clopidogrel 75 MG tablet Commonly known as: PLAVIX Take 1 tablet (75 mg total) by mouth daily.   dorzolamide-timolol 22.3-6.8 MG/ML ophthalmic solution Commonly known as: COSOPT Place 1 drop into the right eye 2 (two) times daily.   fexofenadine 60 MG tablet Commonly known as: Allegra Allergy Take 1 tablet (60 mg total) by mouth 2 (two) times daily. Started by: Howard Pouch, DO   hydrALAZINE 25 MG tablet Commonly known as: APRESOLINE Take 1  tablet (25 mg total) by mouth every 8 (eight) hours.   lisinopril 40 MG tablet Commonly known as: ZESTRIL Take 1 tablet (40 mg total) by mouth daily.   melatonin 5 MG Tabs Take 5 mg by mouth at bedtime as needed (for sleep).   metoprolol succinate 25 MG 24 hr tablet Commonly known as: TOPROL-XL Take 1 tablet (25 mg total) by mouth daily.   NON FORMULARY Take 2 capsules by mouth See admin instructions. UriVArx - Clinically Proven Bladder Control Capsules: Take 2 capsules by mouth once a [place]   Omega-3 300 MG Caps Take 1 tablet by mouth daily.   One-A-Saathoff Womens 50 Plus Tabs Take 1 tablet by mouth daily.   polyethylene glycol powder 17 GM/SCOOP powder Commonly known as: GLYCOLAX/MIRALAX Take 17 g by mouth daily as needed for mild constipation.   pravastatin 40 MG tablet Commonly known as: PRAVACHOL Take 1 tablet (40 mg total) by mouth daily.   Vitamin D-3 25 MCG (1000 UT) Caps Take 1,000 Units by mouth daily.   Vyzulta 0.024 % Soln Generic drug: Latanoprostene Bunod Place 1 drop into the right eye at bedtime.        All past medical history, surgical history, allergies, family history, immunizations and medications were updated in the EMR today and reviewed under the history and medication portions of their EMR.      ROS: Negative, with the exception of above mentioned in HPI  Objective:  BP 114/61   Pulse 80   Temp 98.1 F (36.7 C) (Oral)   Ht '5\' 4"'  (1.626 m)   Wt 133 lb (60.3 kg)   LMP 03/21/1978 (Approximate)   SpO2 100%   BMI 22.83 kg/m  Body mass index is 22.83 kg/m. Physical Exam Vitals and nursing note reviewed.  Constitutional:      General: She is not in acute distress.    Appearance: Normal appearance. She is not ill-appearing, toxic-appearing or diaphoretic.  HENT:     Head: Normocephalic and atraumatic.  Eyes:     General: No scleral icterus.       Right eye: No discharge.        Left eye: No discharge.     Extraocular Movements:  Extraocular movements intact.     Conjunctiva/sclera: Conjunctivae normal.     Pupils: Pupils are equal, round, and reactive to light.  Cardiovascular:     Rate and Rhythm: Normal rate and regular rhythm.  Heart sounds: No murmur heard.    No friction rub. No gallop.  Pulmonary:     Effort: Pulmonary effort is normal. No respiratory distress.     Breath sounds: Normal breath sounds. No wheezing, rhonchi or rales.  Musculoskeletal:     Cervical back: Neck supple. No tenderness.     Right lower leg: No edema.     Left lower leg: No edema.  Lymphadenopathy:     Cervical: No cervical adenopathy.  Skin:    General: Skin is warm and dry.     Coloration: Skin is not jaundiced or pale.     Findings: No erythema or rash.  Neurological:     Mental Status: She is alert and oriented to person, place, and time. Mental status is at baseline.     Motor: No weakness.     Gait: Gait normal.  Psychiatric:        Mood and Affect: Mood normal.        Behavior: Behavior normal.        Thought Content: Thought content normal.        Judgment: Judgment normal.    Assessment/Plan: Madison Martinez is a 88 y.o. female present for OV for  Essential hypertension/hyperlipidemia Stable Continue hydralazine 25 mg 3 times daily> she is ok with taking this every 8 hrs.   Continue lisinopril 40 Continue metoprolol daily Continue pravastatin 40 mg daily TSH and lipids > stable UTD 04/2021. Follow-up every 4.5 months   Stage 3 chronic kidney disease Avoid NSAIDs Renally dose meds when appropriate CMP collected today  Monoclonal B-cell lymphocytosis: Patient has declined follow-up with heme-onc. Monitor CBC twice yearly CBC collected today  Cerebrovascular accident (CVA) due to stenosis of left middle cerebral artery (HCC)/Aneurysm of internal carotid artery/small vessel disease/cerebral atrophy/abnormal EEG/chronic anticoagulation Continue Plavix indefinitely. Continue follow-ups with neurology   Discussed referral to physical therapy to help with leg strength.  Aortic valve mass/Aortic atherosclerosis (HCC)/aortic calcification -Referral to cardiology was placed in the past  Elevated TSH: -has been very mildly  Elevated tsh. Elected to watch instead of starting med.  - tsh WNL 04/2021  Elevated glucose/diet controlled DM: Diet controlled.  A1 C collected today Reviewed expectations re: course of current medical issues. Discussed self-management of symptoms. Outlined signs and symptoms indicating need for more acute intervention. Patient verbalized understanding and all questions were answered. Patient received an After-Visit Summary. Any changes in medications were reviewed and patient was provided with updated med list with their AVS.     Orders Placed This Encounter  Procedures   CBC w/Diff   Comp Met (CMET)   Hemoglobin A1c   Meds ordered this encounter  Medications   fexofenadine (ALLEGRA ALLERGY) 60 MG tablet    Sig: Take 1 tablet (60 mg total) by mouth 2 (two) times daily.    Dispense:  60 tablet    Refill:  5   clopidogrel (PLAVIX) 75 MG tablet    Sig: Take 1 tablet (75 mg total) by mouth daily.    Dispense:  90 tablet    Refill:  1   hydrALAZINE (APRESOLINE) 25 MG tablet    Sig: Take 1 tablet (25 mg total) by mouth every 8 (eight) hours.    Dispense:  90 tablet    Refill:  5   lisinopril (ZESTRIL) 40 MG tablet    Sig: Take 1 tablet (40 mg total) by mouth daily.    Dispense:  90 tablet    Refill:  1   metoprolol succinate (TOPROL-XL) 25 MG 24 hr tablet    Sig: Take 1 tablet (25 mg total) by mouth daily.    Dispense:  90 tablet    Refill:  1   pravastatin (PRAVACHOL) 40 MG tablet    Sig: Take 1 tablet (40 mg total) by mouth daily.    Dispense:  90 tablet    Refill:  3   Referral Orders  No referral(s) requested today    Note is dictated utilizing voice recognition software. Although note has been proof read prior to signing, occasional  typographical errors still can be missed. If any questions arise, please do not hesitate to call for verification.   electronically signed by:  Howard Pouch, DO  [place]

## 2021-11-09 NOTE — Telephone Encounter (Signed)
Pt's son called stating pt is refusing to go to ED. Spoke with provider to advise that it is important that pt goes because the levels are not sustainable to life. Son said he will try to get pt to go.

## 2021-11-09 NOTE — Telephone Encounter (Signed)
Spoke with patient' son and went over results and further recommendations. He voiced understanding and will proceed to take patient to ED.

## 2021-11-09 NOTE — Telephone Encounter (Addendum)
Please call patient's son Kyani as hemoglobin/blood levels are 6.8.  This is extremely anemic and is likely the reason why she is feeling more weak in her legs.  Blood level this low needs to be seen in the emergency room setting and blood transfusions provided. This level causes strain on the heart and can cause ischemia. Recommend she be seen immediately in the emergency room

## 2021-11-09 NOTE — Telephone Encounter (Signed)
Please call patient's son They are aware of the significant anemia and was encouraged to report to the emergency room. Her other labs show stable diabetes and stable kidney function.

## 2021-11-09 NOTE — Telephone Encounter (Signed)
  CRITICAL VALUE STICKER  CRITICAL VALUE: hct 21.9 hemoglobin 6.8   RECEIVER (on-site recipient of call): [place] NOTIFIED: [id number]  MESSENGER (representative from lab): Santiago Glad  MD NOTIFIED: Raoul Pitch   TIME OF NOTIFICATION: 1627  RESPONSE:

## 2021-11-10 NOTE — Telephone Encounter (Signed)
Spoke with pt regarding labs and instructions.   

## 2021-11-10 NOTE — Telephone Encounter (Signed)
Pt son stated that pt refuses to go son was informed that she needs tx severely and is recommended to go to the hos urgently.

## 2022-03-09 ENCOUNTER — Other Ambulatory Visit: Payer: Self-pay | Admitting: Family Medicine

## 2022-03-13 ENCOUNTER — Other Ambulatory Visit: Payer: Self-pay | Admitting: Family Medicine

## 2022-03-24 ENCOUNTER — Other Ambulatory Visit: Payer: Self-pay | Admitting: Family Medicine

## 2022-04-26 ENCOUNTER — Ambulatory Visit: Payer: Medicare Other | Admitting: Family Medicine

## 2022-04-26 NOTE — Progress Notes (Deleted)
Madison Martinez , 1928/01/28, 89 y.o., female MRN: [id number] Patient Care Team    Relationship Specialty Notifications Start End  Ma Hillock, DO PCP - General Family Medicine  09/01/16   Verdell Carmine, MD Referring Physician Internal Medicine  09/01/16   [facility]  Ophthalmology  06/16/17   Manuela Neptune, DPM Referring Physician Podiatry  06/16/17   Danice Goltz, MD Consulting Physician Ophthalmology  11/09/21     No chief complaint on file.    Subjective:  Madison Martinez  is a 89 y.o. female presents for follow up on her medical conditions with her son. Essential hypertension/hyperlipidemia/CKD3/CVA/aortic atherosclerosis/chronic anticoagulation/CVA Pt reports compliance with Metoprolol-XL 25 mg daily, hydralazine 25 mg TID and  lisinopril 40 mg qd.  Patient denies chest pain, shortness of breath, dizziness or lower extremity edema.   She is taking statin. She reports compliance with Plavix.  She has followed with neurology status post stroke. Diet: Monitors her diet, low-sodium Exercise: Does not exercise much, walks with a cane RF: Hypertension, hyperlipidemia, family history of heart disease   Type 2 diabetes mellitus without complication, without long-term current use of insulin ([place])- Diet controlled.  Diet controlled now. Had been on glipizide low dose in the past .  Monoclonal B-cell lymphocytosis She is suppose to follow with her heme/onc every 3 months- but has not done so in some time. CBC has been stable here every 6 months.  Elevated TSH:*** TSH has been mildly elevated intermittently.  No medications were started as of yet.  Patient reports today that she has noticed some weakness in her bilateral lower extremities.  She walks at home with a walker and able to ambulate on her own.  Her son states she is a little unsteady in her gait.  EEG-05/15/2019 IMPRESSION: This study is suggestive of non specific cortical dysfunction in left  frontotemporal region. No seizures or definite epileptiform discharges were seen throughout the recording. [place]   MR ANGIO HEAD WO CONTRAST- 05/15/2019 IMPRESSION: 1. Two punctate subcentimeter acute ischemic nonhemorrhagic cortical infarcts involving the left frontoparietal region as above, likely embolic in nature. 2. Underlying moderately advanced cerebral atrophy with chronic small vessel ischemic disease.  MR BRAIN WO CONTRAST- 05/15/2019 IMPRESSION: 1. Approximate 8 mm severe stenosis extending from the left carotid terminus into the left M1 segment. Left MCA branches are perfused distally. 2. 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 Short-segment approximate 60% right V4 stenosis as above. 3. Extensive atheromatous change throughout the carotid siphons with associated moderate to advanced multifocal stenoses. 4. 5 mm cavernous right ICA aneurysm .  US RENAL-05/15/2019 Right Kidney: Renal measurements: 8.6 x 5.3 x 3.1 cm = volume: 73.8 mL . Echogenicity within normal limits. No worrisome mass, shadowing calculus or hydronephrosis visualized.  Left Kidney: Renal measurements: 9.4 x 5.3 x 3.6 cm = volume: 94 mL. 0.8 x 0.6 x 0.6 cm anechoic cystic lesion in the interpolar left kidney most compatible with simple renal cyst corresponding well to a partially exophytic cyst on CT. No worrisome renal lesion. No shadowing calculus or hydronephrosis.  Bladder: Appears normal for degree of bladder distention.  IMPRESSION: Anechoic 8 mm likely simple cyst in the left kidney. Otherwise unremarkable renal ultrasound.    ECHOCARDIOGRAM COMPLETE-05/15/2019 IMPRESSIONS  1. There is a mass (0.8 cm x 0.5 cm) that appears to be attached to the right coronary cusp of the aortic valve. It does  appear calcified and is best seen in the PLAX views. There is no destruction of the valve to suggest vegetation/endocarditis. 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 A TEE is recommended for better characterization in this patient with stroke.          ECHO TEE- 05/17/2019 IMPRESSIONS   Normal LV function; moderate LAE; trace AI; mild MR and TR.   Left ventricular ejection fraction, by estimation, is 55 to 60%. The left ventricle has normal function. The left ventricle has no regional wall motion abnormalities.   Right ventricular systolic function is normal. The right ventricular size is normal. Aortic Valve: The aortic valve is tricuspid. Aortic valve regurgitation is trivial. Mild to moderate aortic valve sclerosis/calcification is present, without any evidence of aortic stenosis. Aorta: The aortic root is normal in size and structure. There is moderate (Grade III) plaque involving the descending aorta.  05/20/2019-Carotid Arterial Duplex Study  Summary: Right Carotid: Velocities in the right ICA are consistent with a 1-39% stenosis. Left Carotid: Velocities in the left ICA are consistent with a 1-39% stenosis. Vertebrals: Bilateral vertebral arteries demonstrate antegrade flow.      08/18/2021   10:52 AM 08/12/2020   10:39 AM 02/26/2020    1:55 PM 07/10/2019   11:57 AM 08/03/2018    2:16 PM  Depression screen PHQ 2/9  Decreased Interest 0 0 0 0 0  Down, Depressed, Hopeless 0 0 0 0 0  PHQ - 2 Score 0 0 0 0 0    Allergies  Allergen Reactions   Amlodipine Besylate Other (See Comments)    Vertigo   Influenza Virus Vaccine Swelling    Site of swelling not known by son   Social History   Tobacco Use   Smoking status: Never   Smokeless tobacco: Never  Substance Use Topics   Alcohol use: No   Past Medical History:  Diagnosis Date   Blind hypertensive eye, left 04/18/2012   Changing skin lesion 11/23/2018   CVA (cerebral vascular accident) [facility])    Gait disturbance 10/05/2015   pt declined neuro referral per records   Generalized weakness 05/15/2019   Hard of hearing 12/02/2015   hearing aids   Hyperlipidemia     Hypertension    Insomnia    "tried melatonin, advil PM, xanax and klonopin without success"   Leukocytosis 11/07/2013   seen oncology, did not want to continue to follow there, so continued follow up at PCP.   Osteoporosis    Polyneuropathy    Primary open angle glaucoma 01/2011   Spinal stenosis    Stable branch retinal vein occlusion 01/06/2012   Type 2 diabetes mellitus ([place])    "diet controlled"   Past Surgical History:  Procedure Laterality Date   BUBBLE STUDY  05/17/2019   Procedure: BUBBLE STUDY;  Surgeon: Lelon Perla, MD;  Location: [facility] ENDOSCOPY;  Service: Cardiovascular;;   [place]  2005   TEE WITHOUT CARDIOVERSION N/A 05/17/2019   Procedure: TRANSESOPHAGEAL ECHOCARDIOGRAM (TEE);  Surgeon: Lelon Perla, MD;  Location[facility] ENDOSCOPY;  Service: Cardiovascular;  Laterality: N/A;   TOTAL HIP ARTHROPLASTY Left 1991   TOTAL HIP ARTHROPLASTY Right ?   x 2   Family History  Problem Relation Age of Onset   Heart disease Brother    Cancer Other        prostate   Ovarian cancer Neg Hx    Breast cancer Neg Hx  Uterine cancer Neg Hx    Colon cancer Neg Hx    Allergies as of 04/26/2022       Reactions   Amlodipine Besylate Other (See Comments)   Vertigo   Influenza Virus Vaccine Swelling   Site of swelling not known by son        Medication List        Accurate as of April 26, 2022  9:34 AM. If you have any questions, ask your nurse or doctor.          clopidogrel 75 MG tablet Commonly known as: PLAVIX Take 1 tablet (75 mg total) by mouth daily.   dorzolamide-timolol 2-0.5 % ophthalmic solution Commonly known as: COSOPT Place 1 drop into the right eye 2 (two) times daily.   fexofenadine 60 MG tablet Commonly known as: Allegra Allergy Take 1 tablet (60 mg total) by mouth 2 (two) times daily.   hydrALAZINE 25 MG tablet Commonly known as: APRESOLINE Take 1 tablet (25 mg total) by mouth every 8 (eight) hours.   lisinopril  40 MG tablet Commonly known as: ZESTRIL Take 1 tablet (40 mg total) by mouth daily.   melatonin 5 MG Tabs Take 5 mg by mouth at bedtime as needed (for sleep).   metoprolol succinate 25 MG 24 hr tablet Commonly known as: TOPROL-XL TAKE 1 TABLET BY MOUTH DAILY   NON FORMULARY Take 2 capsules by mouth See admin instructions. UriVArx - Clinically Proven Bladder Control Capsules: Take 2 capsules by mouth once a Patriarca   Omega-3 300 MG Caps Take 1 tablet by mouth daily.   One-A-Cannan Womens 50 Plus Tabs Take 1 tablet by mouth daily.   polyethylene glycol powder 17 GM/SCOOP powder Commonly known as: GLYCOLAX/MIRALAX Take 17 g by mouth daily as needed for mild constipation.   pravastatin 40 MG tablet Commonly known as: PRAVACHOL Take 1 tablet (40 mg total) by mouth daily.   Vitamin D-3 25 MCG (1000 UT) Caps Take 1,000 Units by mouth daily.   Vyzulta 0.024 % Soln Generic drug: Latanoprostene Bunod Place 1 drop into the right eye at bedtime.        All past medical history, surgical history, allergies, family history, immunizations and medications were updated in the EMR today and reviewed under the history and medication portions of their EMR.      ROS: Negative, with the exception of above mentioned in HPI  Objective:  LMP 03/21/1978 (Approximate)  There is no height or weight on file to calculate BMI. Physical Exam Vitals and nursing note reviewed.  Constitutional:      General: She is not in acute distress.    Appearance: Normal appearance. She is not ill-appearing, toxic-appearing or diaphoretic.  HENT:     Head: Normocephalic and atraumatic.  Eyes:     General: No scleral icterus.       Right eye: No discharge.        Left eye: No discharge.     Extraocular Movements: Extraocular movements intact.     Conjunctiva/sclera: Conjunctivae normal.     Pupils: Pupils are equal, round, and reactive to light.  Cardiovascular:     Rate and Rhythm: Normal rate and regular  rhythm.     Heart sounds: No murmur heard.    No friction rub. No gallop.  Pulmonary:     Effort: Pulmonary effort is normal. No respiratory distress.     Breath sounds: Normal breath sounds. No wheezing, rhonchi or rales.  Musculoskeletal:  Cervical back: Neck supple. No tenderness.     Right lower leg: No edema.     Left lower leg: No edema.  Lymphadenopathy:     Cervical: No cervical adenopathy.  Skin:    General: Skin is warm and dry.     Coloration: Skin is not jaundiced or pale.     Findings: No erythema or rash.  Neurological:     Mental Status: She is alert and oriented to person, place, and time. Mental status is at baseline.     Motor: No weakness.     Gait: Gait normal.  Psychiatric:        Mood and Affect: Mood normal.        Behavior: Behavior normal.        Thought Content: Thought content normal.        Judgment: Judgment normal.    Assessment/Plan: Madison Martinez is a 89 y.o. female present for OV for  Essential hypertension/hyperlipidemia*** Stable Continue hydralazine 25 mg 3 times daily> she is ok with taking this every 8 hrs.   Continue lisinopril 40 Continue metoprolol daily Continue pravastatin 40 mg daily TSH and lipids > stable UTD 04/2021. Follow-up every 4.5 months   Stage 3 chronic kidney disease Avoid NSAIDs Renally dose meds when appropriate CMP collected today  Monoclonal B-cell lymphocytosis: Patient has declined follow-up with heme-onc. Monitor CBC twice yearly CBC collected today  Cerebrovascular accident (CVA) due to stenosis of left middle cerebral artery (HCC)/Aneurysm of internal carotid artery/small vessel disease/cerebral atrophy/abnormal EEG/chronic anticoagulation Continue Plavix indefinitely. Continue follow-ups with neurology  Discussed referral to physical therapy to help with leg strength.  Aortic valve mass/Aortic atherosclerosis (HCC)/aortic calcification -Referral to cardiology was placed in the past  Elevated  TSH: -has been very mildly  Elevated tsh. Elected to watch instead of starting med.  - tsh WNL 04/2021  Elevated glucose/diet controlled DM: Diet controlled.  A1 C collected today Reviewed expectations re: course of current medical issues. Discussed self-management of symptoms. Outlined signs and symptoms indicating need for more acute intervention. Patient verbalized understanding and all questions were answered. Patient received an After-Visit Summary. Any changes in medications were reviewed and patient was provided with updated med list with their AVS.     No orders of the defined types were placed in this encounter.  No orders of the defined types were placed in this encounter.  Referral Orders  No referral(s) requested today    Note is dictated utilizing voice recognition software. Although note has been proof read prior to signing, occasional typographical errors still can be missed. If any questions arise, please do not hesitate to call for verification.   electronically signed by:  Howard Pouch, DO  [place]

## 2022-05-23 ENCOUNTER — Other Ambulatory Visit: Payer: Self-pay | Admitting: Family Medicine

## 2022-06-12 ENCOUNTER — Other Ambulatory Visit: Payer: Self-pay | Admitting: Family Medicine

## 2022-06-13 NOTE — Telephone Encounter (Signed)
Pt was advised last time that an appt was needed for further refills

## 2022-07-01 ENCOUNTER — Other Ambulatory Visit: Payer: Self-pay | Admitting: Family Medicine

## 2022-07-02 ENCOUNTER — Other Ambulatory Visit: Payer: Self-pay | Admitting: Family Medicine

## 2022-07-17 ENCOUNTER — Other Ambulatory Visit: Payer: Self-pay | Admitting: Family Medicine

## 2022-07-18 ENCOUNTER — Other Ambulatory Visit: Payer: Self-pay | Admitting: Family Medicine

## 2022-08-01 ENCOUNTER — Other Ambulatory Visit: Payer: Self-pay | Admitting: Family Medicine

## 2022-08-01 DIAGNOSIS — H401111 Primary open-angle glaucoma, right eye, mild stage: Secondary | ICD-10-CM | POA: Diagnosis not present

## 2022-08-08 ENCOUNTER — Ambulatory Visit: Payer: Self-pay

## 2022-08-08 NOTE — Patient Instructions (Signed)
Visit Information  Thank you for taking time to visit with me today. Please don't hesitate to contact me if I can be of assistance to you.   Following are the goals we discussed today:  -Contact the care coordination team as needed   If you are experiencing a Mental Health or Behavioral Health Crisis or need someone to talk to, please go to [facility] [address], [place] [phone]) call 911  The patient verbalized understanding of instructions, educational materials, and care plan provided today and DECLINED offer to receive copy of patient instructions, educational materials, and care plan.   Bevelyn Ngo, BSW, CDP Social Worker, Certified Dementia Practitioner [facility] Care Management  Care Coordination [phone]

## 2022-08-08 NOTE — Patient Outreach (Signed)
  Care Coordination   Initial Visit Note   08/08/2022 Name: Rogan Noa Bansal MRN: [id number] DOB: [birth year]  Zekiah M Ollis is a 89 y.o. year old female who sees Kuneff, Renee A, DO for primary care. I  spoke with patients son Max by phone.  What matters to the patients health and wellness today?  No concerns, patient is doing well at this time.    Goals Addressed             This Visit's Progress    COMPLETED: Care Coordination Team       Care Coordination Interventions: [facility] screening performed - no acute resource challenges identified at this time Determined the patient does not have difficulty with medication costs and/or adherence Introduced role of the care coordination team - no follow up desired at this time Provided contact number should the patient need assistance with care coordination in the future Encouraged patient to contact providers office as needed         SDOH assessments and interventions completed:  Yes  SDOH Interventions Today    Flowsheet Row Most Recent Value  SDOH Interventions   Food Insecurity Interventions Intervention Not Indicated  Housing Interventions Intervention Not Indicated  Transportation Interventions Intervention Not Indicated        Care Coordination Interventions:  Yes, provided   Interventions Today    Flowsheet Row Most Recent Value  Chronic Disease   Chronic disease during today's visit Hypertension (HTN), Diabetes  General Interventions   General Interventions Discussed/Reviewed General Interventions Discussed, Doctor Visits  Doctor Visits Discussed/Reviewed Doctor Visits Reviewed  Education Interventions   Education Provided Provided Education  Provided Verbal Education On Other  [Role of the care coordination team]        Follow up plan: No further intervention required.   Encounter Outcome:  Pt. Visit Completed    Bevelyn Ngo, BSW, CDP Social Worker, Certified Dementia Practitioner [facility] Care Management   Care Coordination [phone]

## 2022-08-16 ENCOUNTER — Other Ambulatory Visit: Payer: Self-pay | Admitting: Family Medicine

## 2022-08-31 ENCOUNTER — Ambulatory Visit (INDEPENDENT_AMBULATORY_CARE_PROVIDER_SITE_OTHER): Payer: Medicare Other

## 2022-08-31 VITALS — Wt 133.0 lb

## 2022-08-31 DIAGNOSIS — Z0000 Encounter for general adult medical examination without abnormal findings: Secondary | ICD-10-CM

## 2022-08-31 NOTE — Progress Notes (Addendum)
I connected with  Madison Martinez on 08/31/22 by a audio enabled telemedicine application and verified that I am speaking with the correct person using two identifiers. Along with son Max   Patient Location: Home  Provider Location: Home Office  I discussed the limitations of evaluation and management by telemedicine. The patient expressed understanding and agreed to proceed.   Subjective:   Madison Martinez is a 89 y.o. female who presents for Medicare Annual (Subsequent) preventive examination.  Review of Systems           Objective:    Today's Vitals   08/31/22 1113  Weight: 133 lb (60.3 kg)   Body mass index is 22.83 kg/m.     08/31/2022   11:21 AM 08/18/2021   10:53 AM 08/12/2020   10:36 AM 11/04/2019    2:50 PM 05/14/2019    8:46 PM 08/03/2018    2:12 PM 06/16/2017   11:06 AM  Advanced Directives  Does Patient Have a Medical Advance Directive? Yes Yes Yes No No No Yes  Type of Estate agent of [place];Living will Healthcare Power of [employer] of [place];Living will    Healthcare Power of Attorney  Copy of Healthcare Power of Attorney in Chart? No - copy requested No - copy requested No - copy requested    No - copy requested  Would patient like information on creating a medical advance directive?    No - Patient declined No - Patient declined      Current Medications (verified) Outpatient Encounter Medications as of 08/31/2022  Medication Sig   Cholecalciferol (VITAMIN D-3) 25 MCG (1000 UT) CAPS Take 1,000 Units by mouth daily.   clopidogrel (PLAVIX) 75 MG tablet TAKE 1 TABLET BY MOUTH DAILY   dorzolamide-timolol (COSOPT) 22.3-6.8 MG/ML ophthalmic solution Place 1 drop into the right eye 2 (two) times daily.   fexofenadine (ALLEGRA ALLERGY) 60 MG tablet Take 1 tablet (60 mg total) by mouth 2 (two) times daily.   hydrALAZINE (APRESOLINE) 25 MG tablet Take 1 tablet (25 mg total) by mouth every 8 (eight) hours.   lisinopril (ZESTRIL) 40 MG  tablet TAKE 1 TABLET BY MOUTH DAILY   Melatonin 5 MG TABS Take 5 mg by mouth at bedtime as needed (for sleep).   metoprolol succinate (TOPROL-XL) 25 MG 24 hr tablet TAKE 1 TABLET BY MOUTH DAILY   Multiple Vitamins-Minerals (ONE-A-Esquer WOMENS 50 PLUS) TABS Take 1 tablet by mouth daily.   Omega-3 300 MG CAPS Take 1 tablet by mouth daily.   polyethylene glycol powder (GLYCOLAX/MIRALAX) 17 GM/SCOOP powder Take 17 g by mouth daily as needed for mild constipation.   pravastatin (PRAVACHOL) 40 MG tablet Take 1 tablet (40 mg total) by mouth daily.   VYZULTA 0.024 % SOLN Place 1 drop into the right eye at bedtime.   NON FORMULARY Take 2 capsules by mouth See admin instructions. UriVArx - Clinically Proven Bladder Control Capsules: Take 2 capsules by mouth once a [name] (Patient not taking: Reported on 08/31/2022)   No facility-administered encounter medications on file as of 08/31/2022.    Allergies (verified) Amlodipine besylate and Influenza virus vaccine   History: Past Medical History:  Diagnosis Date   Blind hypertensive eye, left 04/18/2012   Changing skin lesion 11/23/2018   CVA (cerebral vascular accident) [facility])    Gait disturbance 10/05/2015   pt declined neuro referral per records   Generalized weakness 05/15/2019   Hard of hearing 12/02/2015   hearing aids  Hyperlipidemia    Hypertension    Insomnia    "tried melatonin, advil PM, xanax and klonopin without success"   Leukocytosis 11/07/2013   seen oncology, did not want to continue to follow there, so continued follow up at PCP.   Osteoporosis    Polyneuropathy    Primary open angle glaucoma 01/2011   Spinal stenosis    Stable branch retinal vein occlusion 01/06/2012   Type 2 diabetes mellitus (HCC)    "diet controlled"   Past Surgical History:  Procedure Laterality Date   BUBBLE STUDY  05/17/2019   Procedure: BUBBLE STUDY;  Surgeon: Lewayne Bunting, MD;  Location: [facility] ENDOSCOPY;  Service: Cardiovascular;;   EYE SURGERY      SPINE SURGERY  2005   TEE WITHOUT CARDIOVERSION N/A 05/17/2019   Procedure: TRANSESOPHAGEAL ECHOCARDIOGRAM (TEE);  Surgeon: Lewayne Bunting, MD;  Location: [facility] ENDOSCOPY;  Service: Cardiovascular;  Laterality: N/A;   TOTAL HIP ARTHROPLASTY Left 1991   TOTAL HIP ARTHROPLASTY Right ?   x 2   Family History  Problem Relation Age of Onset   Heart disease Brother    Cancer Other        prostate   Ovarian cancer Neg Hx    Breast cancer Neg Hx    Uterine cancer Neg Hx    Colon cancer Neg Hx    Social History   Socioeconomic History   Marital status: Divorced    Spouse name: Not on file   Number of children: 3   Years of education: 12   Highest education level: Not on file  Occupational History   Not on file  Tobacco Use   Smoking status: Never   Smokeless tobacco: Never  Vaping Use   Vaping Use: Never used  Substance and Sexual Activity   Alcohol use: No   Drug use: No   Sexual activity: Never    Birth control/protection: Post-menopausal  Other Topics Concern   Not on file  Social History Narrative   Divorced. 3 children [name], Max and [name]. Lives alone.   High school graduate.   Takes a daily vitamin.   Wears her seatbelt. Wears a hearing aid. Wears dentures.   Requires a cane for assistive device for walking.   Smoke detector in the home.   Feels safe in her relationships.   Social Determinants of Health   Financial Resource Strain: Low Risk  (08/31/2022)   Overall Financial Resource Strain (CARDIA)    Difficulty of Paying Living Expenses: Not hard at all  Food Insecurity: No Food Insecurity (08/31/2022)   Hunger Vital Sign    Worried About Running Out of Food in the Last Year: Never true    Ran Out of Food in the Last Year: Never true  Transportation Needs: No Transportation Needs (08/31/2022)   PRAPARE - Administrator, Civil Service (Medical): No    Lack of Transportation (Non-Medical): No  Physical Activity: Insufficiently Active (08/31/2022)    Exercise Vital Sign    Days of Exercise per Week: 7 days    Minutes of Exercise per Session: 10 min  Stress: No Stress Concern Present (08/31/2022)   [employer] of Occupational Health - Occupational Stress Questionnaire    Feeling of Stress : Not at all  Social Connections: Socially Isolated (08/31/2022)   Social Connection and Isolation Panel [NHANES]    Frequency of Communication with Friends and Family: Twice a week    Frequency of Social Gatherings with Friends and Family: Once  a week    Attends Religious Services: Never    Active Member of Clubs or Organizations: No    Attends Banker Meetings: Never    Marital Status: Divorced    Tobacco Counseling Counseling given: Not Answered   Clinical Intake:  Pre-visit preparation completed: Yes  Pain : No/denies pain     BMI - recorded: 22.83 Nutritional Status: BMI of 19-24  Normal Nutritional Risks: None Diabetes: Yes CBG done?: No Did pt. bring in CBG monitor from home?: No  How often do you need to have someone help you when you read instructions, pamphlets, or other written materials from your doctor or pharmacy?: 1 - Never  Diabetic?Nutrition Risk Assessment:  Has the patient had any N/V/D within the last 2 months?  No  Does the patient have any non-healing wounds?  No  Has the patient had any unintentional weight loss or weight gain?  No   Diabetes:  Is the patient diabetic?  Yes  If diabetic, was a CBG obtained today?  No  Did the patient bring in their glucometer from home?  No  How often do you monitor your CBG's? N/a.   Financial Strains and Diabetes Management:  Are you having any financial strains with the device, your supplies or your medication? No .  Does the patient want to be seen by Chronic Care Management for management of their diabetes?  No  Would the patient like to be referred to a Nutritionist or for Diabetic Management?  No   Diabetic Exams:  Diabetic Eye Exam: Overdue  for diabetic eye exam. Pt has been advised about the importance in completing this exam. Patient advised to call and schedule an eye exam. Diabetic Foot Exam: Overdue, Pt has been advised about the importance in completing this exam. Pt is scheduled for diabetic foot exam on next appt .   Interpreter Needed?: No  Information entered by :: Lanier Ensign, LPN   Activities of Daily Living    08/31/2022   11:22 AM  In your present state of health, do you have any difficulty performing the following activities:  Hearing? 1  In the past six months, have you accidently leaked urine? Y  Comment wears a brief  Do you have problems with loss of bowel control? Y    Patient Care Team: Natalia Leatherwood, DO as PCP - General (Family Medicine) Mathis Bud, MD as Referring Physician (Internal Medicine) [facility] (Ophthalmology) Cinda Quest, DPM as Referring Physician (Podiatry) Elwin Mocha, MD as Consulting Physician (Ophthalmology)  Indicate any recent Medical Services you may have received from other than [facility] providers in the past year (date may be approximate).     Assessment:   This is a routine wellness examination for Madison Martinez.  Hearing/Vision screen Hearing Screening - Comments:: Pt has hearing aids  Vision Screening - Comments:: Pt follows up with Dr Sherryll Burger for annual eye exams   Dietary issues and exercise activities discussed: Current Exercise Habits: The patient does not participate in regular exercise at present   Goals Addressed             This Visit's Progress    Patient Stated       None at this time        Depression Screen    08/31/2022   11:20 AM 08/18/2021   10:52 AM 08/12/2020   10:39 AM 02/26/2020    1:55 PM 07/10/2019   11:57 AM 08/03/2018    2:16  PM 08/03/2018    2:13 PM  PHQ 2/9 Scores  PHQ - 2 Score 0 0 0 0 0 0 0    Fall Risk    08/31/2022   11:22 AM 08/18/2021   10:53 AM 08/12/2020   10:38 AM 02/26/2020    1:55 PM  06/27/2019    9:56 AM  Fall Risk   Falls in the past year? 1 0 0 0 0  Number falls in past yr: 1 0 0 0   Injury with Fall? 0 0 0 0   Risk for fall due to : Impaired vision;Impaired balance/gait;Impaired mobility      Follow up Falls prevention discussed Falls prevention discussed Falls prevention discussed  Falls evaluation completed    FALL RISK PREVENTION PERTAINING TO THE HOME:  Any stairs in or around the home? Yes  If so, are there any without handrails? No  Home free of loose throw rugs in walkways, pet beds, electrical cords, etc? Yes  Adequate lighting in your home to reduce risk of falls? Yes   ASSISTIVE DEVICES UTILIZED TO PREVENT FALLS:  Life alert? Yes  whistle  Use of a cane, walker or w/c? Yes  Grab bars in the bathroom? Yes  Shower chair or bench in shower? Yes  Elevated toilet seat or a handicapped toilet? No   TIMED UP AND GO:  Was the test performed? No .   Cognitive Function:Declined     06/16/2017   11:08 AM  MMSE - Mini Mental State Exam  Orientation to time 4  Orientation to Place 5  Registration 3  Attention/ Calculation 2  Recall 1  Language- name 2 objects 2  Language- repeat 1  Language- follow 3 step command 3  Language- read & follow direction 1  Write a sentence 1  Copy design 1  Total score 24        Immunizations  There is no immunization history on file for this patient.    Flu Vaccine status: Declined, Education has been provided regarding the importance of this vaccine but patient still declined. Advised may receive this vaccine at local pharmacy or Health Dept. Aware to provide a copy of the vaccination record if obtained from local pharmacy or Health Dept. Verbalized acceptance and understanding.  Pneumococcal vaccine status: Declined,  Education has been provided regarding the importance of this vaccine but patient still declined. Advised may receive this vaccine at local pharmacy or Health Dept. Aware to provide a copy of  the vaccination record if obtained from local pharmacy or Health Dept. Verbalized acceptance and understanding.   Covid-19 vaccine status: Declined, Education has been provided regarding the importance of this vaccine but patient still declined. Advised may receive this vaccine at local pharmacy or Health Dept.or vaccine clinic. Aware to provide a copy of the vaccination record if obtained from local pharmacy or Health Dept. Verbalized acceptance and understanding.  Qualifies for Shingles Vaccine? No   Zostavax completed No   Shingrix Completed?: No.    Education has been provided regarding the importance of this vaccine. Patient has been advised to call insurance company to determine out of pocket expense if they have not yet received this vaccine. Advised may also receive vaccine at local pharmacy or Health Dept. Verbalized acceptance and understanding.  Screening Tests Health Maintenance  Topic Date Due   DTaP/Tdap/Td (1 - Tdap) Never done   OPHTHALMOLOGY EXAM  08/19/2020   FOOT EXAM  05/12/2022   HEMOGLOBIN A1C  05/12/2022   Pneumonia  Vaccine 74+ Years old (1 of 1 - PCV) 05/12/2024 (Originally [birth year]1994)   Zoster Vaccines- Shingrix (1 of 2) 08/09/2024 (Originally 08/02/1946)   Medicare Annual Wellness (AWV)  08/31/2023   HPV VACCINES  Aged Out   DEXA SCAN  Discontinued   COVID-19 Vaccine  Discontinued    Health Maintenance  Health Maintenance Due  Topic Date Due   DTaP/Tdap/Td (1 - Tdap) Never done   OPHTHALMOLOGY EXAM  08/19/2020   FOOT EXAM  05/12/2022   HEMOGLOBIN A1C  05/12/2022    Colorectal cancer screening: No longer required.   Mammogram status: No longer required due to age.     Additional Screening:  Vision Screening: Recommended annual ophthalmology exams for early detection of glaucoma and other disorders of the eye. Is the patient up to date with their annual eye exam?  Yes  Who is the provider or what is the name of the office in which the patient attends  annual eye exams? Dr Sherryll Burger If pt is not established with a provider, would they like to be referred to a provider to establish care? No .   Dental Screening: Recommended annual dental exams for proper oral hygiene  Community Resource Referral / Chronic Care Management: CRR required this visit?  No   CCM required this visit?  No      Plan:     I have personally reviewed and noted the following in the patient's chart:   Medical and social history Use of alcohol, tobacco or illicit drugs  Current medications and supplements including opioid prescriptions. Patient is not currently taking opioid prescriptions. Functional ability and status Nutritional status Physical activity Advanced directives List of other physicians Hospitalizations, surgeries, and ER visits in previous 12 months Vitals Screenings to include cognitive, depression, and falls Referrals and appointments  In addition, I have reviewed and discussed with patient certain preventive protocols, quality metrics, and best practice recommendations. A written personalized care plan for preventive services as well as general preventive health recommendations were provided to patient.     Marzella Schlein, LPN   [id number]   Nurse Notes: Pt very HOH, son assisted with visit

## 2022-08-31 NOTE — Patient Instructions (Signed)
[name] , Thank you for taking time to come for your Medicare Wellness Visit. I appreciate your ongoing commitment to your health goals. Please review the following plan we discussed and let me know if I can assist you in the future.   These are the goals we discussed:  Goals      Patient Stated     Maintain current health.      Patient Stated     Drink more water     Patient Stated     None at times      Patient Stated     None at this time         This is a list of the screening recommended for you and due dates:  Health Maintenance  Topic Date Due   DTaP/Tdap/Td vaccine (1 - Tdap) Never done   Eye exam for diabetics  08/19/2020   Complete foot exam   05/12/2022   Hemoglobin A1C  05/12/2022   Pneumonia Vaccine (1 of 1 - PCV) 05/12/2024*   Zoster (Shingles) Vaccine (1 of 2) 08/09/2024*   Medicare Annual Wellness Visit  08/31/2023   HPV Vaccine  Aged Out   DEXA scan (bone density measurement)  Discontinued   COVID-19 Vaccine  Discontinued  *Topic was postponed. The date shown is not the original due date.    Advanced directives: Please bring a copy of your health care power of attorney and living will to the office at your convenience.  Conditions/risks identified: none at this time   Next appointment: Follow up in one year for your annual wellness visit    Preventive Care 65 Years and Older, Female Preventive care refers to lifestyle choices and visits with your health care provider that can promote health and wellness. What does preventive care include? A yearly physical exam. This is also called an annual well check. Dental exams once or twice a year. Routine eye exams. Ask your health care provider how often you should have your eyes checked. Personal lifestyle choices, including: Daily care of your teeth and gums. Regular physical activity. Eating a healthy diet. Avoiding tobacco and drug use. Limiting alcohol use. Practicing safe sex. Taking low-dose  aspirin every [place]. Taking vitamin and mineral supplements as recommended by your health care provider. What happens during an annual well check? The services and screenings done by your health care provider during your annual well check will depend on your age, overall health, lifestyle risk factors, and family history of disease. Counseling  Your health care provider may ask you questions about your: Alcohol use. Tobacco use. Drug use. Emotional well-being. Home and relationship well-being. Sexual activity. Eating habits. History of falls. Memory and ability to understand (cognition). Work and work Astronomer. Reproductive health. Screening  You may have the following tests or measurements: Height, weight, and BMI. Blood pressure. Lipid and cholesterol levels. These may be checked every 5 years, or more frequently if you are over 2 years old. Skin check. Lung cancer screening. You may have this screening every year starting at age 17 if you have a 30-pack-year history of smoking and currently smoke or have quit within the past 15 years. Fecal occult blood test (FOBT) of the stool. You may have this test every year starting at age 35. Flexible sigmoidoscopy or colonoscopy. You may have a sigmoidoscopy every 5 years or a colonoscopy every 10 years starting at age 65. Hepatitis C blood test. Hepatitis B blood test. Sexually transmitted disease (STD) testing. Diabetes screening.  This is done by checking your blood sugar (glucose) after you have not eaten for a while (fasting). You may have this done every 1-3 years. Bone density scan. This is done to screen for osteoporosis. You may have this done starting at age 50. Mammogram. This may be done every 1-2 years. Talk to your health care provider about how often you should have regular mammograms. Talk with your health care provider about your test results, treatment options, and if necessary, the need for more tests. Vaccines  Your  health care provider may recommend certain vaccines, such as: Influenza vaccine. This is recommended every year. Tetanus, diphtheria, and acellular pertussis (Tdap, Td) vaccine. You may need a Td booster every 10 years. Zoster vaccine. You may need this after age 85. Pneumococcal 13-valent conjugate (PCV13) vaccine. One dose is recommended after age 9. Pneumococcal polysaccharide (PPSV23) vaccine. One dose is recommended after age 90. Talk to your health care provider about which screenings and vaccines you need and how often you need them. This information is not intended to replace advice given to you by your health care provider. Make sure you discuss any questions you have with your health care provider. Document Released: 04/03/2015 Document Revised: 11/25/2015 Document Reviewed: 01/06/2015 Elsevier Interactive Patient Education  2017 [employer].  Fall Prevention in the Home Falls can cause injuries. They can happen to people of all ages. There are many things you can do to make your home safe and to help prevent falls. What can I do on the outside of my home? Regularly fix the edges of walkways and driveways and fix any cracks. Remove anything that might make you trip as you walk through a door, such as a raised step or threshold. Trim any bushes or trees on the path to your home. Use bright outdoor lighting. Clear any walking paths of anything that might make someone trip, such as rocks or tools. Regularly check to see if handrails are loose or broken. Make sure that both sides of any steps have handrails. Any raised decks and porches should have guardrails on the edges. Have any leaves, snow, or ice cleared regularly. Use sand or salt on walking paths during winter. Clean up any spills in your garage right away. This includes oil or grease spills. What can I do in the bathroom? Use night lights. Install grab bars by the toilet and in the tub and shower. Do not use towel bars as  grab bars. Use non-skid mats or decals in the tub or shower. If you need to sit down in the shower, use a plastic, non-slip stool. Keep the floor dry. Clean up any water that spills on the floor as soon as it happens. Remove soap buildup in the tub or shower regularly. Attach bath mats securely with double-sided non-slip rug tape. Do not have throw rugs and other things on the floor that can make you trip. What can I do in the bedroom? Use night lights. Make sure that you have a light by your bed that is easy to reach. Do not use any sheets or blankets that are too big for your bed. They should not hang down onto the floor. Have a firm chair that has side arms. You can use this for support while you get dressed. Do not have throw rugs and other things on the floor that can make you trip. What can I do in the kitchen? Clean up any spills right away. Avoid walking on wet floors. Keep items  that you use a lot in easy-to-reach places. If you need to reach something above you, use a strong step stool that has a grab bar. Keep electrical cords out of the way. Do not use floor polish or wax that makes floors slippery. If you must use wax, use non-skid floor wax. Do not have throw rugs and other things on the floor that can make you trip. What can I do with my stairs? Do not leave any items on the stairs. Make sure that there are handrails on both sides of the stairs and use them. Fix handrails that are broken or loose. Make sure that handrails are as long as the stairways. Check any carpeting to make sure that it is firmly attached to the stairs. Fix any carpet that is loose or worn. Avoid having throw rugs at the top or bottom of the stairs. If you do have throw rugs, attach them to the floor with carpet tape. Make sure that you have a light switch at the top of the stairs and the bottom of the stairs. If you do not have them, ask someone to add them for you. What else can I do to help prevent  falls? Wear shoes that: Do not have high heels. Have rubber bottoms. Are comfortable and fit you well. Are closed at the toe. Do not wear sandals. If you use a stepladder: Make sure that it is fully opened. Do not climb a closed stepladder. Make sure that both sides of the stepladder are locked into place. Ask someone to hold it for you, if possible. Clearly mark and make sure that you can see: Any grab bars or handrails. First and last steps. Where the edge of each step is. Use tools that help you move around (mobility aids) if they are needed. These include: Canes. Walkers. Scooters. Crutches. Turn on the lights when you go into a dark area. Replace any light bulbs as soon as they burn out. Set up your furniture so you have a clear path. Avoid moving your furniture around. If any of your floors are uneven, fix them. If there are any pets around you, be aware of where they are. Review your medicines with your doctor. Some medicines can make you feel dizzy. This can increase your chance of falling. Ask your doctor what other things that you can do to help prevent falls. This information is not intended to replace advice given to you by your health care provider. Make sure you discuss any questions you have with your health care provider. Document Released: 01/01/2009 Document Revised: 08/13/2015 Document Reviewed: 04/11/2014 Elsevier Interactive Patient Education  2017 [employer].

## 2022-09-02 ENCOUNTER — Encounter: Payer: Self-pay | Admitting: Family Medicine

## 2022-09-02 ENCOUNTER — Ambulatory Visit (INDEPENDENT_AMBULATORY_CARE_PROVIDER_SITE_OTHER): Payer: Medicare Other | Admitting: Family Medicine

## 2022-09-02 VITALS — BP 169/78 | HR 77 | Temp 98.4°F | Wt 134.0 lb

## 2022-09-02 DIAGNOSIS — I63512 Cerebral infarction due to unspecified occlusion or stenosis of left middle cerebral artery: Secondary | ICD-10-CM | POA: Diagnosis not present

## 2022-09-02 DIAGNOSIS — I700 Atherosclerosis of aorta: Secondary | ICD-10-CM | POA: Diagnosis not present

## 2022-09-02 DIAGNOSIS — D72820 Lymphocytosis (symptomatic): Secondary | ICD-10-CM

## 2022-09-02 DIAGNOSIS — Z7901 Long term (current) use of anticoagulants: Secondary | ICD-10-CM

## 2022-09-02 DIAGNOSIS — N1832 Chronic kidney disease, stage 3b: Secondary | ICD-10-CM

## 2022-09-02 DIAGNOSIS — E119 Type 2 diabetes mellitus without complications: Secondary | ICD-10-CM

## 2022-09-02 DIAGNOSIS — I739 Peripheral vascular disease, unspecified: Secondary | ICD-10-CM

## 2022-09-02 DIAGNOSIS — G319 Degenerative disease of nervous system, unspecified: Secondary | ICD-10-CM

## 2022-09-02 DIAGNOSIS — M810 Age-related osteoporosis without current pathological fracture: Secondary | ICD-10-CM

## 2022-09-02 DIAGNOSIS — E7800 Pure hypercholesterolemia, unspecified: Secondary | ICD-10-CM

## 2022-09-02 DIAGNOSIS — I10 Essential (primary) hypertension: Secondary | ICD-10-CM | POA: Diagnosis not present

## 2022-09-02 DIAGNOSIS — I671 Cerebral aneurysm, nonruptured: Secondary | ICD-10-CM

## 2022-09-02 LAB — CBC WITH DIFFERENTIAL/PLATELET
Basophils Absolute: 0.1 10*3/uL (ref 0.0–0.1)
Basophils Relative: 0.6 % (ref 0.0–3.0)
Eosinophils Absolute: 0.3 10*3/uL (ref 0.0–0.7)
Eosinophils Relative: 2.8 % (ref 0.0–5.0)
HCT: 35.3 % — ABNORMAL LOW (ref 36.0–46.0)
Hemoglobin: 11.8 g/dL — ABNORMAL LOW (ref 12.0–15.0)
Lymphocytes Relative: 47.1 % — ABNORMAL HIGH (ref 12.0–46.0)
Lymphs Abs: 4.7 10*3/uL — ABNORMAL HIGH (ref 0.7–4.0)
MCHC: 33.4 g/dL (ref 30.0–36.0)
MCV: 89.9 fl (ref 78.0–100.0)
Monocytes Absolute: 0.7 10*3/uL (ref 0.1–1.0)
Monocytes Relative: 7.1 % (ref 3.0–12.0)
Neutro Abs: 4.2 10*3/uL (ref 1.4–7.7)
Neutrophils Relative %: 42.4 % — ABNORMAL LOW (ref 43.0–77.0)
Platelets: 178 10*3/uL (ref 150.0–400.0)
RBC: 3.92 Mil/uL (ref 3.87–5.11)
RDW: 13.9 % (ref 11.5–15.5)
WBC: 9.9 10*3/uL (ref 4.0–10.5)

## 2022-09-02 LAB — COMPREHENSIVE METABOLIC PANEL
ALT: 14 U/L (ref 0–35)
AST: 24 U/L (ref 0–37)
Albumin: 4.1 g/dL (ref 3.5–5.2)
Alkaline Phosphatase: 70 U/L (ref 39–117)
BUN: 19 mg/dL (ref 6–23)
CO2: 26 mEq/L (ref 19–32)
Calcium: 9.5 mg/dL (ref 8.4–10.5)
Chloride: 105 mEq/L (ref 96–112)
Creatinine, Ser: 1.06 mg/dL (ref 0.40–1.20)
GFR: 45.42 mL/min — ABNORMAL LOW (ref >60.00)
Glucose, Bld: 135 mg/dL — ABNORMAL HIGH (ref 70–99)
Potassium: 4.2 mEq/L (ref 3.5–5.1)
Sodium: 139 mEq/L (ref 135–145)
Total Bilirubin: 0.4 mg/dL (ref 0.2–1.2)
Total Protein: 7.2 g/dL (ref 6.0–8.3)

## 2022-09-02 LAB — TSH: TSH: 4.06 u[IU]/mL (ref 0.35–5.50)

## 2022-09-02 LAB — HEMOGLOBIN A1C: Hgb A1c MFr Bld: 6.2 % (ref 4.6–6.5)

## 2022-09-02 MED ORDER — METOPROLOL SUCCINATE ER 25 MG PO TB24: 25.0000 mg | ORAL_TABLET | Freq: Every day | ORAL | 1 refills | Status: DC

## 2022-09-02 MED ORDER — PRAVASTATIN SODIUM 40 MG PO TABS: 40.0000 mg | ORAL_TABLET | Freq: Every day | ORAL | 1 refills | Status: DC

## 2022-09-02 MED ORDER — CLOPIDOGREL BISULFATE 75 MG PO TABS: 75.0000 mg | ORAL_TABLET | Freq: Every day | ORAL | 3 refills | Status: DC

## 2022-09-02 MED ORDER — HYDRALAZINE HCL 25 MG PO TABS: 25.0000 mg | ORAL_TABLET | Freq: Three times a day (TID) | ORAL | 1 refills | Status: DC

## 2022-09-02 MED ORDER — LISINOPRIL 40 MG PO TABS: 40.0000 mg | ORAL_TABLET | Freq: Every day | ORAL | 1 refills | Status: DC

## 2022-09-02 NOTE — Progress Notes (Signed)
Madison Martinez , 1927-08-27, 89 y.o., female MRN: [id number] Patient Care Team    Relationship Specialty Notifications Start End  Natalia Leatherwood, DO PCP - General Family Medicine  09/01/16   Madison Bud, MD Referring Physician Internal Medicine  09/01/16   [facility]  Ophthalmology  06/16/17   Cinda Quest, DPM Referring Physician Podiatry  06/16/17   Madison Mocha, MD Consulting Physician Ophthalmology  11/09/21     Chief Complaint  Patient presents with   Hypertension    No meds taken, currently out of meds     Subjective:  Madison Martinez  is a 89 y.o. female presents for follow up on her medical conditions with her son. Essential hypertension/hyperlipidemia/CKD3/CVA/aortic atherosclerosis/chronic anticoagulation/CVA Pt reports compliance with Metoprolol-XL 25 mg daily, hydralazine 25 mg TID and  lisinopril 40 mg qd.  Patient denies chest pain, shortness of breath, dizziness or lower extremity edema.   She is taking statin. She reports compliance with Plavix.  She has followed with neurology status post stroke. Diet: Monitors her diet, low-sodium Exercise: Does not exercise much, walks with a cane RF: Hypertension, hyperlipidemia, family history of heart disease   Type 2 diabetes mellitus without complication, without long-term current use of insulin (HCC)- Diet controlled.  Diet controlled. now. Had been on glipizide low dose in the past .  Monoclonal B-cell lymphocytosis She is suppose to follow with her heme/onc every 3 months- but has not in some time. CBC has been stable here every 6 months. Elevated TSH: TSH has been mildly elevated intermittently.  No medications were started as of yet.  She walks at home with a walker and able to ambulate on her own.  EEG-05/15/2019 IMPRESSION: This study is suggestive of non specific cortical dysfunction in left frontotemporal region. No seizures or definite epileptiform discharges were seen throughout the  recording. Madison Martinez   MR ANGIO HEAD WO CONTRAST- 05/15/2019 IMPRESSION: 1. Two punctate subcentimeter acute ischemic nonhemorrhagic cortical infarcts involving the left frontoparietal region as above, likely embolic in nature. 2. Underlying moderately advanced cerebral atrophy with chronic small vessel ischemic disease.  MR BRAIN WO CONTRAST- 05/15/2019 IMPRESSION: 1. Approximate 8 mm severe stenosis extending from the left carotid terminus into the left M1 segment. Left MCA branches are perfused distally. 2. 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 Short-segment approximate 60% right V4 stenosis as above. 3. Extensive atheromatous change throughout the carotid siphons with associated moderate to advanced multifocal stenoses. 4. 5 mm cavernous right ICA aneurysm .  US RENAL-05/15/2019 Right Kidney: Renal measurements: 8.6 x 5.3 x 3.1 cm = volume: 73.8 mL . Echogenicity within normal limits. No worrisome mass, shadowing calculus or hydronephrosis visualized.  Left Kidney: Renal measurements: 9.4 x 5.3 x 3.6 cm = volume: 94 mL. 0.8 x 0.6 x 0.6 cm anechoic cystic lesion in the interpolar left kidney most compatible with simple renal cyst corresponding well to a partially exophytic cyst on CT. No worrisome renal lesion. No shadowing calculus or hydronephrosis.  Bladder: Appears normal for degree of bladder distention.  IMPRESSION: Anechoic 8 mm likely simple cyst in the left kidney. Otherwise unremarkable renal ultrasound.    ECHOCARDIOGRAM COMPLETE-05/15/2019 IMPRESSIONS  1. There is a mass (0.8 cm x 0.5 cm) that appears to be attached to the right coronary cusp of the aortic valve. It does appear calcified and is best seen in the PLAX views. There is no destruction of  the valve to suggest vegetation/endocarditis. 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 A TEE is recommended for better characterization in this patient with  stroke.          ECHO TEE- 05/17/2019 IMPRESSIONS   Normal LV function; moderate LAE; trace AI; mild MR and TR.   Left ventricular ejection fraction, by estimation, is 55 to 60%. The left ventricle has normal function. The left ventricle has no regional wall motion abnormalities.   Right ventricular systolic function is normal. The right ventricular size is normal. Aortic Valve: The aortic valve is tricuspid. Aortic valve regurgitation is trivial. Mild to moderate aortic valve sclerosis/calcification is present, without any evidence of aortic stenosis. Aorta: The aortic root is normal in size and structure. There is moderate (Grade III) plaque involving the descending aorta.  05/20/2019-Carotid Arterial Duplex Study  Summary: Right Carotid: Velocities in the right ICA are consistent with a 1-39% stenosis. Left Carotid: Velocities in the left ICA are consistent with a 1-39% stenosis. Vertebrals: Bilateral vertebral arteries demonstrate antegrade flow.      09/02/2022    1:50 PM 08/31/2022   11:20 AM 08/18/2021   10:52 AM 08/12/2020   10:39 AM 02/26/2020    1:55 PM  Depression screen PHQ 2/9  Decreased Interest 0 0 0 0 0  Down, Depressed, Hopeless 0 0 0 0 0  PHQ - 2 Score 0 0 0 0 0    Allergies  Allergen Reactions   Amlodipine Besylate Other (See Comments)    Vertigo   Influenza Virus Vaccine Swelling    Site of swelling not known by son   Social History   Tobacco Use   Smoking status: Never   Smokeless tobacco: Never  Substance Use Topics   Alcohol use: No   Past Medical History:  Diagnosis Date   Blind hypertensive eye, left 04/18/2012   Changing skin lesion 11/23/2018   CVA (cerebral vascular accident) [facility])    Gait disturbance 10/05/2015   pt declined neuro referral per records   Generalized weakness 05/15/2019   Hard of hearing 12/02/2015   hearing aids   Hyperlipidemia    Hypertension    Insomnia    "tried melatonin, advil PM, xanax and klonopin without success"    Leukocytosis 11/07/2013   seen oncology, did not want to continue to follow there, so continued follow up at PCP.   Osteoporosis    Polyneuropathy    Primary open angle glaucoma 01/2011   Spinal stenosis    Stable branch retinal vein occlusion 01/06/2012   Type 2 diabetes mellitus (HCC)    "diet controlled"   Past Surgical History:  Procedure Laterality Date   BUBBLE STUDY  05/17/2019   Procedure: BUBBLE STUDY;  Surgeon: Lewayne Bunting, MD;  Location: [facility] ENDOSCOPY;  Service: Cardiovascular;;   EYE SURGERY     SPINE SURGERY  2005   TEE WITHOUT CARDIOVERSION N/A 05/17/2019   Procedure: TRANSESOPHAGEAL ECHOCARDIOGRAM (TEE);  Surgeon: Lewayne Bunting, MD;  Location: [facility] ENDOSCOPY;  Service: Cardiovascular;  Laterality: N/A;   TOTAL HIP ARTHROPLASTY Left 1991   TOTAL HIP ARTHROPLASTY Right ?   x 2   Family History  Problem Relation Age of Onset   Heart disease Brother    Cancer Other        prostate   Ovarian cancer Neg Hx    Breast cancer Neg Hx    Uterine cancer Neg Hx    Colon cancer Neg Hx    Allergies  as of 09/02/2022       Reactions   Amlodipine Besylate Other (See Comments)   Vertigo   Influenza Virus Vaccine Swelling   Site of swelling not known by son        Medication List        Accurate as of September 02, 2022  2:09 PM. If you have any questions, ask your nurse or doctor.          STOP taking these medications    dorzolamide-timolol 2-0.5 % ophthalmic solution Commonly known as: COSOPT Stopped by: Madison Pacini, DO   fexofenadine 60 MG tablet Commonly known as: Allegra Allergy Stopped by: Madison Pacini, DO   melatonin 5 MG Tabs Stopped by: Madison Pacini, DO   NON FORMULARY Stopped by: Madison Pacini, DO   Vyzulta 0.024 % Soln Generic drug: Latanoprostene Bunod Stopped by: Madison Pacini, DO       TAKE these medications    clopidogrel 75 MG tablet Commonly known as: PLAVIX Take 1 tablet (75 mg total) by mouth daily.   hydrALAZINE 25 MG  tablet Commonly known as: APRESOLINE Take 1 tablet (25 mg total) by mouth every 8 (eight) hours.   lisinopril 40 MG tablet Commonly known as: ZESTRIL Take 1 tablet (40 mg total) by mouth daily.   metoprolol succinate 25 MG 24 hr tablet Commonly known as: TOPROL-XL Take 1 tablet (25 mg total) by mouth daily.   Omega-3 300 MG Caps Take 1 tablet by mouth daily.   One-A-[place] Womens 50 Plus Tabs Take 1 tablet by mouth daily.   polyethylene glycol powder 17 GM/SCOOP powder Commonly known as: GLYCOLAX/MIRALAX Take 17 g by mouth daily as needed for mild constipation.   pravastatin 40 MG tablet Commonly known as: PRAVACHOL Take 1 tablet (40 mg total) by mouth daily.   Vitamin D-3 25 MCG (1000 UT) Caps Take 1,000 Units by mouth daily.        All past medical history, surgical history, allergies, family history, immunizations and medications were updated in the EMR today and reviewed under the history and medication portions of their EMR.      ROS: Negative, with the exception of above mentioned in HPI  Objective:  BP (!) 169/78   Pulse 77   Temp 98.4 F (36.9 C)   Wt 134 lb (60.8 kg)   LMP 03/21/1978 (Approximate)   SpO2 95%   BMI 23.00 kg/m  Body mass index is 23 kg/m. Physical Exam Vitals and nursing note reviewed.  Constitutional:      General: She is not in acute distress.    Appearance: Normal appearance. She is not ill-appearing, toxic-appearing or diaphoretic.  HENT:     Head: Normocephalic and atraumatic.  Eyes:     General: No scleral icterus.       Right eye: No discharge.        Left eye: No discharge.     Extraocular Movements: Extraocular movements intact.     Conjunctiva/sclera: Conjunctivae normal.     Pupils: Pupils are equal, round, and reactive to light.  Cardiovascular:     Rate and Rhythm: Normal rate and regular rhythm.     Heart sounds: No murmur heard.    No friction rub. No gallop.  Pulmonary:     Effort: Pulmonary effort is normal. No  respiratory distress.     Breath sounds: Normal breath sounds. No wheezing, rhonchi or rales.  Musculoskeletal:     Cervical back: Neck supple. No tenderness.  Right lower leg: No edema.     Left lower leg: No edema.  Lymphadenopathy:     Cervical: No cervical adenopathy.  Skin:    General: Skin is warm and dry.     Coloration: Skin is not jaundiced or pale.     Findings: No erythema or rash.  Neurological:     Mental Status: She is alert and oriented to person, place, and time. Mental status is at baseline.     Motor: No weakness.     Gait: Gait normal.  Psychiatric:        Mood and Affect: Mood normal.        Behavior: Behavior normal.        Thought Content: Thought content normal.        Judgment: Judgment normal.    Assessment/Plan: Eyanna M Rief is a 89 y.o. female present for OV for  Essential hypertension/hyperlipidemia Stable continue hydralazine 25 mg 3 times daily> she is ok with taking this every 8 hrs.   Continue  lisinopril 40 Continue  metoprolol daily Continue  pravastatin 40 mg daily Labs CBC, CMP, TSH collected  Stage 3 chronic kidney disease Avoid NSAIDs Renally dose meds when appropriate CMP collected today  Monoclonal B-cell lymphocytosis: Patient has declined follow-up with heme-onc. Monitor CBC twice yearly CBC collected today  Cerebrovascular accident (CVA) due to stenosis of left middle cerebral artery (HCC)/Aneurysm of internal carotid artery/small vessel disease/cerebral atrophy/abnormal EEG/chronic anticoagulation Continue  Plavix indefinitely. Continue follow-ups with neurology   Aortic valve mass/Aortic atherosclerosis (HCC)/aortic calcification -Referral to cardiology was placed in the past  Elevated TSH: -has been very mildly  Elevated tsh. Elected to watch instead of starting med.  - tsh collected today  Elevated glucose/diet controlled DM: Diet controlled.  A1 C collected today  Reviewed expectations re: course of current  medical issues. Discussed self-management of symptoms. Outlined signs and symptoms indicating need for more acute intervention. Patient verbalized understanding and all questions were answered. Patient received an After-Visit Summary. Any changes in medications were reviewed and patient was provided with updated med list with their AVS.     Orders Placed This Encounter  Procedures   CBC w/Diff   Comp Met (CMET)   TSH   Hemoglobin A1c   Meds ordered this encounter  Medications   clopidogrel (PLAVIX) 75 MG tablet    Sig: Take 1 tablet (75 mg total) by mouth daily.    Dispense:  90 tablet    Refill:  3   hydrALAZINE (APRESOLINE) 25 MG tablet    Sig: Take 1 tablet (25 mg total) by mouth every 8 (eight) hours.    Dispense:  270 tablet    Refill:  1   metoprolol succinate (TOPROL-XL) 25 MG 24 hr tablet    Sig: Take 1 tablet (25 mg total) by mouth daily.    Dispense:  90 tablet    Refill:  1   pravastatin (PRAVACHOL) 40 MG tablet    Sig: Take 1 tablet (40 mg total) by mouth daily.    Dispense:  90 tablet    Refill:  1   lisinopril (ZESTRIL) 40 MG tablet    Sig: Take 1 tablet (40 mg total) by mouth daily.    Dispense:  90 tablet    Refill:  1   Referral Orders  No referral(s) requested today    Note is dictated utilizing voice recognition software. Although note has been proof read prior to signing, occasional typographical errors still can  be missed. If any questions arise, please do not hesitate to call for verification.   electronically signed by:  Howard Pouch, DO  [place]

## 2022-09-02 NOTE — Patient Instructions (Addendum)
Return in about 24 weeks (around 02/17/2023) for Routine chronic condition follow-up.        Great to see you today.  I have refilled the medication(s) we provide.   If labs were collected, we will inform you of lab results once received either by echart message or telephone call.   - echart message- for normal results that have been seen by the patient already.   - telephone call: abnormal results or if patient has not viewed results in their echart.

## 2022-09-05 ENCOUNTER — Other Ambulatory Visit: Payer: Self-pay | Admitting: Family Medicine

## 2022-09-12 ENCOUNTER — Other Ambulatory Visit: Payer: Self-pay | Admitting: Family Medicine

## 2022-10-15 ENCOUNTER — Other Ambulatory Visit: Payer: Self-pay | Admitting: Family Medicine

## 2022-10-24 ENCOUNTER — Other Ambulatory Visit: Payer: Self-pay | Admitting: Family Medicine

## 2022-12-12 ENCOUNTER — Other Ambulatory Visit: Payer: Self-pay

## 2022-12-15 ENCOUNTER — Other Ambulatory Visit: Payer: Self-pay

## 2022-12-15 MED ORDER — METOPROLOL SUCCINATE ER 25 MG PO TB24: 25.0000 mg | ORAL_TABLET | Freq: Every day | ORAL | 0 refills | Status: DC

## 2022-12-30 ENCOUNTER — Other Ambulatory Visit: Payer: Self-pay | Admitting: Family Medicine

## 2023-01-02 ENCOUNTER — Other Ambulatory Visit: Payer: Self-pay

## 2023-01-02 MED ORDER — HYDRALAZINE HCL 25 MG PO TABS: 25.0000 mg | ORAL_TABLET | Freq: Three times a day (TID) | ORAL | 0 refills | Status: DC

## 2023-01-16 ENCOUNTER — Other Ambulatory Visit: Payer: Self-pay | Admitting: Family Medicine

## 2023-01-31 ENCOUNTER — Other Ambulatory Visit: Payer: Self-pay | Admitting: Family Medicine

## 2023-02-08 ENCOUNTER — Other Ambulatory Visit: Payer: Self-pay | Admitting: Family Medicine

## 2023-02-09 DIAGNOSIS — H401111 Primary open-angle glaucoma, right eye, mild stage: Secondary | ICD-10-CM | POA: Diagnosis not present

## 2023-02-09 LAB — HM DIABETES EYE EXAM

## 2023-02-24 ENCOUNTER — Encounter: Payer: Self-pay | Admitting: Family Medicine

## 2023-02-24 ENCOUNTER — Ambulatory Visit: Payer: Medicare Other | Admitting: Family Medicine

## 2023-02-24 VITALS — BP 120/70 | HR 69 | Temp 98.0°F | Wt 136.2 lb

## 2023-02-24 DIAGNOSIS — I10 Essential (primary) hypertension: Secondary | ICD-10-CM | POA: Diagnosis not present

## 2023-02-24 DIAGNOSIS — I63512 Cerebral infarction due to unspecified occlusion or stenosis of left middle cerebral artery: Secondary | ICD-10-CM

## 2023-02-24 DIAGNOSIS — D72820 Lymphocytosis (symptomatic): Secondary | ICD-10-CM | POA: Diagnosis not present

## 2023-02-24 DIAGNOSIS — N1832 Chronic kidney disease, stage 3b: Secondary | ICD-10-CM | POA: Diagnosis not present

## 2023-02-24 DIAGNOSIS — Z7901 Long term (current) use of anticoagulants: Secondary | ICD-10-CM

## 2023-02-24 DIAGNOSIS — E7800 Pure hypercholesterolemia, unspecified: Secondary | ICD-10-CM

## 2023-02-24 DIAGNOSIS — M810 Age-related osteoporosis without current pathological fracture: Secondary | ICD-10-CM

## 2023-02-24 DIAGNOSIS — I671 Cerebral aneurysm, nonruptured: Secondary | ICD-10-CM | POA: Diagnosis not present

## 2023-02-24 DIAGNOSIS — E119 Type 2 diabetes mellitus without complications: Secondary | ICD-10-CM | POA: Diagnosis not present

## 2023-02-24 DIAGNOSIS — I739 Peripheral vascular disease, unspecified: Secondary | ICD-10-CM

## 2023-02-24 DIAGNOSIS — I700 Atherosclerosis of aorta: Secondary | ICD-10-CM | POA: Diagnosis not present

## 2023-02-24 DIAGNOSIS — G319 Degenerative disease of nervous system, unspecified: Secondary | ICD-10-CM

## 2023-02-24 LAB — CBC WITH DIFFERENTIAL/PLATELET
Basophils Absolute: 0.1 10*3/uL (ref 0.0–0.1)
Basophils Relative: 0.6 % (ref 0.0–3.0)
Eosinophils Absolute: 0.2 10*3/uL (ref 0.0–0.7)
Eosinophils Relative: 2.3 % (ref 0.0–5.0)
HCT: 39.1 % (ref 36.0–46.0)
Hemoglobin: 12.7 g/dL (ref 12.0–15.0)
Lymphocytes Relative: 46.3 % — ABNORMAL HIGH (ref 12.0–46.0)
Lymphs Abs: 4.5 10*3/uL — ABNORMAL HIGH (ref 0.7–4.0)
MCHC: 32.6 g/dL (ref 30.0–36.0)
MCV: 94.8 fL (ref 78.0–100.0)
Monocytes Absolute: 0.6 10*3/uL (ref 0.1–1.0)
Monocytes Relative: 5.9 % (ref 3.0–12.0)
Neutro Abs: 4.4 10*3/uL (ref 1.4–7.7)
Neutrophils Relative %: 44.9 % (ref 43.0–77.0)
Platelets: 179 10*3/uL (ref 150.0–400.0)
RBC: 4.12 Mil/uL (ref 3.87–5.11)
RDW: 13.6 % (ref 11.5–15.5)
WBC: 9.8 10*3/uL (ref 4.0–10.5)

## 2023-02-24 LAB — COMPREHENSIVE METABOLIC PANEL
ALT: 11 U/L (ref 0–35)
AST: 21 U/L (ref 0–37)
Albumin: 4.2 g/dL (ref 3.5–5.2)
Alkaline Phosphatase: 81 U/L (ref 39–117)
BUN: 18 mg/dL (ref 6–23)
CO2: 28 meq/L (ref 19–32)
Calcium: 9.4 mg/dL (ref 8.4–10.5)
Chloride: 103 meq/L (ref 96–112)
Creatinine, Ser: 1.03 mg/dL (ref 0.40–1.20)
GFR: 46.85 mL/min — ABNORMAL LOW (ref >60.00)
Glucose, Bld: 133 mg/dL — ABNORMAL HIGH (ref 70–99)
Potassium: 4.5 meq/L (ref 3.5–5.1)
Sodium: 139 meq/L (ref 135–145)
Total Bilirubin: 0.5 mg/dL (ref 0.2–1.2)
Total Protein: 7.2 g/dL (ref 6.0–8.3)

## 2023-02-24 LAB — HEMOGLOBIN A1C: Hgb A1c MFr Bld: 6.4 % (ref 4.6–6.5)

## 2023-02-24 MED ORDER — PRAVASTATIN SODIUM 40 MG PO TABS: 40.0000 mg | ORAL_TABLET | Freq: Every day | ORAL | 1 refills | Status: DC

## 2023-02-24 MED ORDER — HYDRALAZINE HCL 25 MG PO TABS: 25.0000 mg | ORAL_TABLET | Freq: Three times a day (TID) | ORAL | 0 refills | Status: DC

## 2023-02-24 MED ORDER — METOPROLOL SUCCINATE ER 25 MG PO TB24: 25.0000 mg | ORAL_TABLET | Freq: Every day | ORAL | 1 refills | Status: DC

## 2023-02-24 MED ORDER — CLOPIDOGREL BISULFATE 75 MG PO TABS: 75.0000 mg | ORAL_TABLET | Freq: Every day | ORAL | 3 refills | Status: DC

## 2023-02-24 MED ORDER — LISINOPRIL 40 MG PO TABS: 40.0000 mg | ORAL_TABLET | Freq: Every day | ORAL | 1 refills | Status: DC

## 2023-02-24 MED ORDER — METOPROLOL SUCCINATE ER 25 MG PO TB24: 25.0000 mg | ORAL_TABLET | Freq: Every day | ORAL | 0 refills | Status: DC

## 2023-02-24 MED ORDER — HYDRALAZINE HCL 25 MG PO TABS: 25.0000 mg | ORAL_TABLET | Freq: Three times a day (TID) | ORAL | 1 refills | Status: DC

## 2023-02-24 NOTE — Patient Instructions (Addendum)

## 2023-02-24 NOTE — Progress Notes (Signed)
Madison Martinez , [birth year], 89 y.o., female MRN: [id number] Patient Care Team    Relationship Specialty Notifications Start End  Natalia Leatherwood, DO PCP - General Family Medicine  09/01/16   Mathis Bud, MD Referring Physician Internal Medicine  09/01/16   [facility]  Ophthalmology  06/16/17   Cinda Quest, DPM Referring Physician Podiatry  06/16/17   Elwin Mocha, MD Consulting Physician Ophthalmology  11/09/21     Chief Complaint  Patient presents with   Hypertension     Subjective:  Madison Martinez  is a 89 y.o. female presents for follow up on her medical conditions with her son. Essential hypertension/hyperlipidemia/CKD3/CVA/aortic atherosclerosis/chronic anticoagulation/CVA Pt reports compliance with Metoprolol-XL 25 mg daily, hydralazine 25 mg TID and  lisinopril 40 mg qd.  Patient denies chest pain, shortness of breath, dizziness or lower extremity edema.  She is taking statin. She reports compliance with Plavix.  She has followed with neurology status post stroke. Diet: Monitors her diet, low-sodium Exercise: Does not exercise much, walks with a cane RF: Hypertension, hyperlipidemia, family history of heart disease   Type 2 diabetes mellitus without complication, without long-term current use of insulin (HCC)- Diet controlled.  Diet controlled. now. Had been on glipizide low dose in the past .  Monoclonal B-cell lymphocytosis She is suppose to follow with her heme/onc every 3 months- but has not in some time. CBC has been stable here every 6 months. Elevated TSH: TSH has been mildly elevated intermittently.  No medications were started as of yet.   EEG-05/15/2019 IMPRESSION: This study is suggestive of non specific cortical dysfunction in left frontotemporal region. No seizures or definite epileptiform discharges were seen throughout the recording. [name]   MR ANGIO HEAD WO CONTRAST- 05/15/2019 IMPRESSION: 1. Two punctate subcentimeter  acute ischemic nonhemorrhagic cortical infarcts involving the left frontoparietal region as above, likely embolic in nature. 2. Underlying moderately advanced cerebral atrophy with chronic small vessel ischemic disease.  MR BRAIN WO CONTRAST- 05/15/2019 IMPRESSION: 1. Approximate 8 mm severe stenosis extending from the left carotid terminus into the left M1 segment. Left MCA branches are perfused distally. 2. 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 Short-segment approximate 60% right V4 stenosis as above. 3. Extensive atheromatous change throughout the carotid siphons with associated moderate to advanced multifocal stenoses. 4. 5 mm cavernous right ICA aneurysm .  US RENAL-05/15/2019 Right Kidney: Renal measurements: 8.6 x 5.3 x 3.1 cm = volume: 73.8 mL . Echogenicity within normal limits. No worrisome mass, shadowing calculus or hydronephrosis visualized.  Left Kidney: Renal measurements: 9.4 x 5.3 x 3.6 cm = volume: 94 mL. 0.8 x 0.6 x 0.6 cm anechoic cystic lesion in the interpolar left kidney most compatible with simple renal cyst corresponding well to a partially exophytic cyst on CT. No worrisome renal lesion. No shadowing calculus or hydronephrosis.  Bladder: Appears normal for degree of bladder distention.  IMPRESSION: Anechoic 8 mm likely simple cyst in the left kidney. Otherwise unremarkable renal ultrasound.    ECHOCARDIOGRAM COMPLETE-05/15/2019 IMPRESSIONS  1. There is a mass (0.8 cm x 0.5 cm) that appears to be attached to the right coronary cusp of the aortic valve. It does appear calcified and is best seen in the PLAX views. There is no destruction of the valve to suggest vegetation/endocarditis. 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 A TEE is recommended for better characterization in this patient  with stroke.          ECHO TEE- 05/17/2019 IMPRESSIONS   Normal LV function; moderate LAE; trace AI; mild MR and TR.    Left ventricular ejection fraction, by estimation, is 55 to 60%. The left ventricle has normal function. The left ventricle has no regional wall motion abnormalities.   Right ventricular systolic function is normal. The right ventricular size is normal. Aortic Valve: The aortic valve is tricuspid. Aortic valve regurgitation is trivial. Mild to moderate aortic valve sclerosis/calcification is present, without any evidence of aortic stenosis. Aorta: The aortic root is normal in size and structure. There is moderate (Grade III) plaque involving the descending aorta.  05/20/2019-Carotid Arterial Duplex Study  Summary: Right Carotid: Velocities in the right ICA are consistent with a 1-39% stenosis. Left Carotid: Velocities in the left ICA are consistent with a 1-39% stenosis. Vertebrals: Bilateral vertebral arteries demonstrate antegrade flow.      09/02/2022    1:50 PM 08/31/2022   11:20 AM 08/18/2021   10:52 AM 08/12/2020   10:39 AM 02/26/2020    1:55 PM  Depression screen PHQ 2/9  Decreased Interest 0 0 0 0 0  Down, Depressed, Hopeless 0 0 0 0 0  PHQ - 2 Score 0 0 0 0 0    Allergies  Allergen Reactions   Amlodipine Besylate Other (See Comments)    Vertigo   Influenza Virus Vaccine Swelling    Site of swelling not known by son   Social History   Tobacco Use   Smoking status: Never   Smokeless tobacco: Never  Substance Use Topics   Alcohol use: No   Past Medical History:  Diagnosis Date   Blind hypertensive eye, left 04/18/2012   Changing skin lesion 11/23/2018   CVA (cerebral vascular accident) [facility])    Gait disturbance 10/05/2015   pt declined neuro referral per records   Generalized weakness 05/15/2019   Hard of hearing 12/02/2015   hearing aids   Hyperlipidemia    Hypertension    Insomnia    "tried melatonin, advil PM, xanax and klonopin without success"   Leukocytosis 11/07/2013   seen oncology, did not want to continue to follow there, so continued follow up at PCP.    Osteoporosis    Polyneuropathy    Primary open angle glaucoma 01/2011   Spinal stenosis    Stable branch retinal vein occlusion 01/06/2012   Type 2 diabetes mellitus (HCC)    "diet controlled"   Past Surgical History:  Procedure Laterality Date   BUBBLE STUDY  05/17/2019   Procedure: BUBBLE STUDY;  Surgeon: Lewayne Bunting, MD;  Location: [facility] ENDOSCOPY;  Service: Cardiovascular;;   EYE SURGERY     SPINE SURGERY  2005   TEE WITHOUT CARDIOVERSION N/A 05/17/2019   Procedure: TRANSESOPHAGEAL ECHOCARDIOGRAM (TEE);  Surgeon: Lewayne Bunting, MD;  Location: [facility] ENDOSCOPY;  Service: Cardiovascular;  Laterality: N/A;   TOTAL HIP ARTHROPLASTY Left 1991   TOTAL HIP ARTHROPLASTY Right ?   x 2   Family History  Problem Relation Age of Onset   Heart disease Brother    Cancer Other        prostate   Ovarian cancer Neg Hx    Breast cancer Neg Hx    Uterine cancer Neg Hx    Colon cancer Neg Hx    Allergies as of 02/24/2023       Reactions   Amlodipine Besylate Other (See Comments)   Vertigo   Influenza Virus Vaccine  Swelling   Site of swelling not known by son        Medication List        Accurate as of February 24, 2023  1:58 PM. If you have any questions, ask your nurse or doctor.          clopidogrel 75 MG tablet Commonly known as: PLAVIX Take 1 tablet (75 mg total) by mouth daily.   dorzolamide-timolol 2-0.5 % ophthalmic solution Commonly known as: COSOPT SMARTSIG:In Eye(s)   hydrALAZINE 25 MG tablet Commonly known as: APRESOLINE Take 1 tablet (25 mg total) by mouth every 8 (eight) hours.   lisinopril 40 MG tablet Commonly known as: ZESTRIL Take 1 tablet (40 mg total) by mouth daily.   metoprolol succinate 25 MG 24 hr tablet Commonly known as: TOPROL-XL Take 1 tablet (25 mg total) by mouth daily.   Omega-3 300 MG Caps Take 1 tablet by mouth daily.   One-A-Henandez Womens 50 Plus Tabs Take 1 tablet by mouth daily.   polyethylene glycol powder 17 GM/SCOOP  powder Commonly known as: GLYCOLAX/MIRALAX Take 17 g by mouth daily as needed for mild constipation.   pravastatin 40 MG tablet Commonly known as: PRAVACHOL Take 1 tablet (40 mg total) by mouth daily.   Vitamin D-3 25 MCG (1000 UT) Caps Take 1,000 Units by mouth daily.   Vyzulta 0.024 % Soln Generic drug: Latanoprostene Bunod Apply 1 drop to eye at bedtime.        All past medical history, surgical history, allergies, family history, immunizations and medications were updated in the EMR today and reviewed under the history and medication portions of their EMR.      ROS: Negative, with the exception of above mentioned in HPI  Objective:  BP 120/70   Pulse 69   Temp 98 F (36.7 C)   Wt 136 lb 3.2 oz (61.8 kg)   LMP 03/21/1978 (Approximate)   SpO2 97%   BMI 23.38 kg/m  Body mass index is 23.38 kg/m. Physical Exam Vitals and nursing note reviewed.  Constitutional:      General: She is not in acute distress.    Appearance: Normal appearance. She is not ill-appearing, toxic-appearing or diaphoretic.  HENT:     Head: Normocephalic and atraumatic.  Eyes:     General: No scleral icterus.       Right eye: No discharge.        Left eye: No discharge.     Extraocular Movements: Extraocular movements intact.     Conjunctiva/sclera: Conjunctivae normal.     Pupils: Pupils are equal, round, and reactive to light.  Cardiovascular:     Rate and Rhythm: Normal rate and regular rhythm.     Heart sounds: No murmur heard.    No friction rub. No gallop.  Pulmonary:     Effort: Pulmonary effort is normal. No respiratory distress.     Breath sounds: Normal breath sounds. No wheezing, rhonchi or rales.  Musculoskeletal:     Cervical back: Neck supple. No tenderness.     Right lower leg: No edema.     Left lower leg: No edema.  Lymphadenopathy:     Cervical: No cervical adenopathy.  Skin:    General: Skin is warm and dry.     Coloration: Skin is not jaundiced or pale.      Findings: No erythema or rash.  Neurological:     Mental Status: She is alert and oriented to person, place, and time. Mental status is  at baseline.     Motor: No weakness.     Gait: Gait normal.  Psychiatric:        Mood and Affect: Mood normal.        Behavior: Behavior normal.        Thought Content: Thought content normal.        Judgment: Judgment normal.    Diabetic Foot Exam - Simple   Simple Foot Form Diabetic Foot exam was performed with the following findings: Yes 02/24/2023  1:51 PM  Visual Inspection No deformities, no ulcerations, no other skin breakdown bilaterally: Yes Sensation Testing Intact to touch and monofilament testing bilaterally: Yes Pulse Check Posterior Tibialis and Dorsalis pulse intact bilaterally: Yes Comments     Assessment/Plan: Caelie M Overholser is a 89 y.o. female present for OV for Chronic Conditions/illness Management Essential hypertension/hyperlipidemia Stable Continue  hydralazine 25 mg 3 times daily> she is ok with taking this every 8 hrs.   Continue  lisinopril 40 Continue   metoprolol daily Continue   pravastatin 40 mg daily Labs: cbc, cmp  Stage 3 chronic kidney disease Avoid NSAIDs Renally dose meds when appropriate Cmp collected today  Monoclonal B-cell lymphocytosis: Patient has declined follow-up with heme-onc. Monitor CBC twice yearly CBC collected today  Cerebrovascular accident (CVA) due to stenosis of left middle cerebral artery (HCC)/Aneurysm of internal carotid artery/small vessel disease/cerebral atrophy/abnormal EEG/chronic anticoagulation Continue  Plavix indefinitely. Continue follow-ups with neurology   Aortic valve mass/Aortic atherosclerosis (HCC)/aortic calcification -Referral to cardiology was placed in the past  Elevated TSH: -has been very mildly  Elevated tsh. Elected to watch instead of starting med.  - tsh -normal  Elevated glucose/diet controlled DM: Diet controlled.  A1 C collected today Foot exam  completed Eye exam requested.   Reviewed expectations re: course of current medical issues. Discussed self-management of symptoms. Outlined signs and symptoms indicating need for more acute intervention. Patient verbalized understanding and all questions were answered. Patient received an After-Visit Summary. Any changes in medications were reviewed and patient was provided with updated med list with their AVS.     Orders Placed This Encounter  Procedures   CBC w/Diff   Comp Met (CMET)   Hemoglobin A1c   Meds ordered this encounter  Medications   DISCONTD: hydrALAZINE (APRESOLINE) 25 MG tablet    Sig: Take 1 tablet (25 mg total) by mouth every 8 (eight) hours.    Dispense:  270 tablet    Refill:  0   lisinopril (ZESTRIL) 40 MG tablet    Sig: Take 1 tablet (40 mg total) by mouth daily.    Dispense:  90 tablet    Refill:  1   DISCONTD: metoprolol succinate (TOPROL-XL) 25 MG 24 hr tablet    Sig: Take 1 tablet (25 mg total) by mouth daily.    Dispense:  30 tablet    Refill:  0    Please send a replace/new response with [employer] if appropriate to maximize member benefit. Requesting 1 year supply.   pravastatin (PRAVACHOL) 40 MG tablet    Sig: Take 1 tablet (40 mg total) by mouth daily.    Dispense:  90 tablet    Refill:  1   clopidogrel (PLAVIX) 75 MG tablet    Sig: Take 1 tablet (75 mg total) by mouth daily.    Dispense:  90 tablet    Refill:  3   hydrALAZINE (APRESOLINE) 25 MG tablet    Sig: Take 1 tablet (25 mg total) by  mouth every 8 (eight) hours.    Dispense:  270 tablet    Refill:  1   metoprolol succinate (TOPROL-XL) 25 MG 24 hr tablet    Sig: Take 1 tablet (25 mg total) by mouth daily.    Dispense:  90 tablet    Refill:  1   Referral Orders  No referral(s) requested today    Note is dictated utilizing voice recognition software. Although note has been proof read prior to signing, occasional typographical errors still can be missed. If any questions  arise, please do not hesitate to call for verification.   electronically signed by:  Felix Pacini, DO  [facility] - [place]

## 2023-04-04 DIAGNOSIS — H4052X3 Glaucoma secondary to other eye disorders, left eye, severe stage: Secondary | ICD-10-CM | POA: Diagnosis not present

## 2023-04-04 DIAGNOSIS — H401111 Primary open-angle glaucoma, right eye, mild stage: Secondary | ICD-10-CM | POA: Diagnosis not present

## 2023-04-30 ENCOUNTER — Other Ambulatory Visit: Payer: Self-pay | Admitting: Family Medicine

## 2023-05-04 ENCOUNTER — Other Ambulatory Visit: Payer: Self-pay | Admitting: Family Medicine

## 2023-05-19 ENCOUNTER — Other Ambulatory Visit: Payer: Self-pay | Admitting: Family Medicine

## 2023-07-15 ENCOUNTER — Other Ambulatory Visit: Payer: Self-pay | Admitting: Family Medicine

## 2023-07-25 ENCOUNTER — Other Ambulatory Visit: Payer: Self-pay | Admitting: Family Medicine

## 2023-07-25 MED ORDER — DORZOLAMIDE HCL-TIMOLOL MAL 2-0.5 % OP SOLN: 1.0000 [drp] | Freq: Two times a day (BID) | OPHTHALMIC | 0 refills | Status: AC

## 2023-07-25 MED ORDER — CLOPIDOGREL BISULFATE 75 MG PO TABS: 75.0000 mg | ORAL_TABLET | Freq: Every day | ORAL | 0 refills | Status: DC

## 2023-07-25 MED ORDER — VYZULTA 0.024 % OP SOLN: 1.0000 [drp] | Freq: Every day | OPHTHALMIC | 0 refills | Status: AC

## 2023-07-25 MED ORDER — LISINOPRIL 40 MG PO TABS: 40.0000 mg | ORAL_TABLET | Freq: Every day | ORAL | 0 refills | Status: DC

## 2023-07-25 NOTE — Telephone Encounter (Signed)
 Copied from CRM [id number]. Topic: Clinical - Medication Refill >> Jul 25, 2023 12:22 PM Luane Rumps D wrote: Most Recent Primary Care Visit:  Provider: Napolean Backbone A  Department: [facility]  Visit Type: OFFICE VISIT  Date: 02/24/2023  Medication:  clopidogrel  (PLAVIX ) 75 MG tablet lisinopril  (ZESTRIL ) 40 MG tablet dorzolamide -timolol  (COSOPT ) 2-0.5 % ophthalmic solution VYZULTA  0.024 % SOLN  Has the patient contacted their pharmacy? No (Agent: If no, request that the patient contact the pharmacy for the refill. If patient does not wish to contact the pharmacy document the reason why and proceed with request.) (Agent: If yes, when and what did the pharmacy advise?)  Is this the correct pharmacy for this prescription? Yes If no, delete pharmacy and type the correct one.  This is the patient's preferred pharmacy:   [facility] - [place] [place] - [address] [place] [address] Phone: [phone] Fax: [phone]   Has the prescription been filled recently? No  Is the patient out of the medication? No, out of lisinopril   Has the patient been seen for an appointment in the last year OR does the patient have an upcoming appointment? Yes  Can we respond through MyChart? No  Agent: Please be advised that Rx refills may take up to 3 business days. We ask that you follow-up with your pharmacy.

## 2023-07-29 ENCOUNTER — Other Ambulatory Visit: Payer: Self-pay | Admitting: Family Medicine

## 2023-07-31 ENCOUNTER — Telehealth: Payer: Self-pay

## 2023-07-31 NOTE — Telephone Encounter (Signed)
  Communication  Reason for CRM: Patient's son Max is calling in regards to the medication refill submitted on 07/25/23. Max stated that he contacted the pharmacy today and they stated that they didn't receive a request for the medication refill. Max stated that it needs to be sent to [facility] pharmacy. I informed Max that the medication refills were approved on 07/25/23. Max would like for the office to resend the approval to the pharmacy today if possible and give him a call back once its completed at [phone].                LVM to discuss.    Note: at the time of callback please ask if pt is completely out of medication.

## 2023-08-03 ENCOUNTER — Other Ambulatory Visit: Payer: Self-pay | Admitting: Family Medicine

## 2023-08-08 DIAGNOSIS — H401111 Primary open-angle glaucoma, right eye, mild stage: Secondary | ICD-10-CM | POA: Diagnosis not present

## 2023-08-11 ENCOUNTER — Encounter: Payer: Self-pay | Admitting: Family Medicine

## 2023-08-11 ENCOUNTER — Ambulatory Visit (INDEPENDENT_AMBULATORY_CARE_PROVIDER_SITE_OTHER): Payer: Medicare Other | Admitting: Family Medicine

## 2023-08-11 VITALS — BP 116/64 | HR 60 | Temp 98.1°F | Wt 130.6 lb

## 2023-08-11 DIAGNOSIS — H35039 Hypertensive retinopathy, unspecified eye: Secondary | ICD-10-CM

## 2023-08-11 DIAGNOSIS — I700 Atherosclerosis of aorta: Secondary | ICD-10-CM

## 2023-08-11 DIAGNOSIS — I739 Peripheral vascular disease, unspecified: Secondary | ICD-10-CM

## 2023-08-11 DIAGNOSIS — G319 Degenerative disease of nervous system, unspecified: Secondary | ICD-10-CM | POA: Diagnosis not present

## 2023-08-11 DIAGNOSIS — I671 Cerebral aneurysm, nonruptured: Secondary | ICD-10-CM

## 2023-08-11 DIAGNOSIS — N1832 Chronic kidney disease, stage 3b: Secondary | ICD-10-CM | POA: Diagnosis not present

## 2023-08-11 DIAGNOSIS — Z7901 Long term (current) use of anticoagulants: Secondary | ICD-10-CM

## 2023-08-11 DIAGNOSIS — D72820 Lymphocytosis (symptomatic): Secondary | ICD-10-CM | POA: Diagnosis not present

## 2023-08-11 DIAGNOSIS — I10 Essential (primary) hypertension: Secondary | ICD-10-CM | POA: Diagnosis not present

## 2023-08-11 DIAGNOSIS — E7800 Pure hypercholesterolemia, unspecified: Secondary | ICD-10-CM

## 2023-08-11 DIAGNOSIS — E119 Type 2 diabetes mellitus without complications: Secondary | ICD-10-CM

## 2023-08-11 DIAGNOSIS — I63512 Cerebral infarction due to unspecified occlusion or stenosis of left middle cerebral artery: Secondary | ICD-10-CM | POA: Diagnosis not present

## 2023-08-11 DIAGNOSIS — M810 Age-related osteoporosis without current pathological fracture: Secondary | ICD-10-CM

## 2023-08-11 LAB — CBC WITH DIFFERENTIAL/PLATELET
Basophils Absolute: 0 10*3/uL (ref 0.0–0.1)
Basophils Relative: 0.5 % (ref 0.0–3.0)
Eosinophils Absolute: 0.2 10*3/uL (ref 0.0–0.7)
Eosinophils Relative: 2.1 % (ref 0.0–5.0)
HCT: 37 % (ref 36.0–46.0)
Hemoglobin: 12.4 g/dL (ref 12.0–15.0)
Lymphocytes Relative: 47.9 % — ABNORMAL HIGH (ref 12.0–46.0)
Lymphs Abs: 4.4 10*3/uL — ABNORMAL HIGH (ref 0.7–4.0)
MCHC: 33.6 g/dL (ref 30.0–36.0)
MCV: 92.2 fl (ref 78.0–100.0)
Monocytes Absolute: 0.7 10*3/uL (ref 0.1–1.0)
Monocytes Relative: 7.1 % (ref 3.0–12.0)
Neutro Abs: 3.9 10*3/uL (ref 1.4–7.7)
Neutrophils Relative %: 42.4 % — ABNORMAL LOW (ref 43.0–77.0)
Platelets: 205 10*3/uL (ref 150.0–400.0)
RBC: 4.01 Mil/uL (ref 3.87–5.11)
RDW: 12.9 % (ref 11.5–15.5)
WBC: 9.2 10*3/uL (ref 4.0–10.5)

## 2023-08-11 LAB — COMPREHENSIVE METABOLIC PANEL WITH GFR
ALT: 12 U/L (ref 0–35)
AST: 21 U/L (ref 0–37)
Albumin: 4.2 g/dL (ref 3.5–5.2)
Alkaline Phosphatase: 78 U/L (ref 39–117)
BUN: 24 mg/dL — ABNORMAL HIGH (ref 6–23)
CO2: 28 meq/L (ref 19–32)
Calcium: 9.6 mg/dL (ref 8.4–10.5)
Chloride: 103 meq/L (ref 96–112)
Creatinine, Ser: 1.25 mg/dL — ABNORMAL HIGH (ref 0.40–1.20)
GFR: 37.02 mL/min — ABNORMAL LOW (ref >60.00)
Glucose, Bld: 113 mg/dL — ABNORMAL HIGH (ref 70–99)
Potassium: 4.7 meq/L (ref 3.5–5.1)
Sodium: 138 meq/L (ref 135–145)
Total Bilirubin: 0.4 mg/dL (ref 0.2–1.2)
Total Protein: 7.2 g/dL (ref 6.0–8.3)

## 2023-08-11 LAB — TSH: TSH: 4.47 u[IU]/mL (ref 0.35–5.50)

## 2023-08-11 LAB — HEMOGLOBIN A1C: Hgb A1c MFr Bld: 6.2 % (ref 4.6–6.5)

## 2023-08-11 MED ORDER — PRAVASTATIN SODIUM 40 MG PO TABS: 40.0000 mg | ORAL_TABLET | Freq: Every day | ORAL | 1 refills | Status: DC

## 2023-08-11 MED ORDER — CLOPIDOGREL BISULFATE 75 MG PO TABS: 75.0000 mg | ORAL_TABLET | Freq: Every day | ORAL | 0 refills | Status: DC

## 2023-08-11 MED ORDER — DICLOFENAC SODIUM 50 MG PO TBEC: 50.0000 mg | DELAYED_RELEASE_TABLET | Freq: Every day | ORAL | 1 refills | Status: DC

## 2023-08-11 MED ORDER — HYDRALAZINE HCL 25 MG PO TABS: 25.0000 mg | ORAL_TABLET | Freq: Three times a day (TID) | ORAL | 1 refills | Status: DC

## 2023-08-11 MED ORDER — LISINOPRIL 40 MG PO TABS
40.0000 mg | ORAL_TABLET | Freq: Every day | ORAL | 0 refills | Status: DC
Start: 2023-08-11 — End: 2023-10-30

## 2023-08-11 MED ORDER — METOPROLOL SUCCINATE ER 25 MG PO TB24: 25.0000 mg | ORAL_TABLET | Freq: Every day | ORAL | 1 refills | Status: DC

## 2023-08-11 NOTE — Patient Instructions (Addendum)

## 2023-08-11 NOTE — Progress Notes (Signed)
 Madison Martinez , 1927/06/03, 89 y.o., female MRN: [id number] Patient Care Team    Relationship Specialty Notifications Start End  Mariel Shope, DO PCP - General Family Medicine  09/01/16   Tolbert Fothergill, MD Referring Physician Internal Medicine  09/01/16   [facility]  Ophthalmology  06/16/17   Lowell Rude, DPM Referring Physician Podiatry  06/16/17   Silverio Drought, MD Consulting Physician Ophthalmology  11/09/21     Chief Complaint  Patient presents with   Hypertension     Subjective:  Madison Martinez  is a 89 y.o. female presents for follow up on her chronic medical conditions with her son.  Right hip pain: Pt reports her right hip arthritis is causing her more pain lately. Making it more difficult to walk around the house.   Essential hypertension/hyperlipidemia/CKD3/CVA/aortic atherosclerosis/chronic anticoagulation/CVA Pt reports compliance with Metoprolol -XL 25 mg daily, hydralazine  25 mg TID and  lisinopril  40 mg qd.  Patient denies chest pain, shortness of breath, dizziness or lower extremity edema.  She is taking statin. She reports compliance with Plavix .  She has followed with neurology status post stroke. Diet: Monitors her diet, low-sodium Exercise: Does not exercise much, walks with a cane RF: Hypertension, hyperlipidemia, family history of heart disease   Type 2 diabetes mellitus without complication, without long-term current use of insulin  (HCC)- Diet controlled.  Diet controlled. now. Had been on glipizide  low dose in the past .  Monoclonal B-cell lymphocytosis She is suppose to follow with her heme/onc every 3 months- but has not in some time. CBC has been stable here every 6 months. Elevated TSH: TSH has been mildly elevated intermittently.  No medications were started as of yet.   EEG-05/15/2019 IMPRESSION: This study is suggestive of non specific cortical dysfunction in left frontotemporal region. No seizures or definite  epileptiform discharges were seen throughout the recording. [name]   MR ANGIO HEAD WO CONTRAST- 05/15/2019 IMPRESSION: 1. Two punctate subcentimeter acute ischemic nonhemorrhagic cortical infarcts involving the left frontoparietal region as above, likely embolic in nature. 2. Underlying moderately advanced cerebral atrophy with chronic small vessel ischemic disease.  MR BRAIN WO CONTRAST- 05/15/2019 IMPRESSION: 1. Approximate 8 mm severe stenosis extending from the left carotid terminus into the left M1 segment. Left MCA branches are perfused distally. 2. 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 Short-segment approximate 60% right V4 stenosis as above. 3. Extensive atheromatous change throughout the carotid siphons with associated moderate to advanced multifocal stenoses. 4. 5 mm cavernous right ICA aneurysm .  US  RENAL-05/15/2019 Right Kidney: Renal measurements: 8.6 x 5.3 x 3.1 cm = volume: 73.8 mL . Echogenicity within normal limits. No worrisome mass, shadowing calculus or hydronephrosis visualized.  Left Kidney: Renal measurements: 9.4 x 5.3 x 3.6 cm = volume: 94 mL. 0.8 x 0.6 x 0.6 cm anechoic cystic lesion in the interpolar left kidney most compatible with simple renal cyst corresponding well to a partially exophytic cyst on CT. No worrisome renal lesion. No shadowing calculus or hydronephrosis.  Bladder: Appears normal for degree of bladder distention.  IMPRESSION: Anechoic 8 mm likely simple cyst in the left kidney. Otherwise unremarkable renal ultrasound.    ECHOCARDIOGRAM COMPLETE-05/15/2019 IMPRESSIONS  1. There is a mass (0.8 cm x 0.5 cm) that appears to be attached to the right coronary cusp of the aortic valve. It does appear calcified and is best seen in the PLAX views. There is  no destruction of the valve to suggest vegetation/endocarditis. 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 A TEE is recommended for  better characterization in this patient with stroke.          ECHO TEE- 05/17/2019 IMPRESSIONS   Normal LV function; moderate LAE; trace AI; mild MR and TR.   Left ventricular ejection fraction, by estimation, is 55 to 60%. The left ventricle has normal function. The left ventricle has no regional wall motion abnormalities.   Right ventricular systolic function is normal. The right ventricular size is normal. Aortic Valve: The aortic valve is tricuspid. Aortic valve regurgitation is trivial. Mild to moderate aortic valve sclerosis/calcification is present, without any evidence of aortic stenosis. Aorta: The aortic root is normal in size and structure. There is moderate (Grade III) plaque involving the descending aorta.  05/20/2019-Carotid Arterial Duplex Study  Summary: Right Carotid: Velocities in the right ICA are consistent with a 1-39% stenosis. Left Carotid: Velocities in the left ICA are consistent with a 1-39% stenosis. Vertebrals: Bilateral vertebral arteries demonstrate antegrade flow.      08/11/2023    1:32 PM 09/02/2022    1:50 PM 08/31/2022   11:20 AM 08/18/2021   10:52 AM 08/12/2020   10:39 AM  Depression screen PHQ 2/9  Decreased Interest 2 0 0 0 0  Down, Depressed, Hopeless 1 0 0 0 0  PHQ - 2 Score 3 0 0 0 0  Altered sleeping 2      Tired, decreased energy 3      Change in appetite 0      Feeling bad or failure about yourself  0      Trouble concentrating 1      Moving slowly or fidgety/restless 1      Suicidal thoughts 0      PHQ-9 Score 10      Difficult doing work/chores Not difficult at all        Allergies  Allergen Reactions   Amlodipine Besylate Other (See Comments)    Vertigo   Influenza Virus Vaccine Swelling    Site of swelling not known by son   Social History   Tobacco Use   Smoking status: Never   Smokeless tobacco: Never  Substance Use Topics   Alcohol use: No   Past Medical History:  Diagnosis Date   Blind hypertensive eye, left 04/18/2012    Changing skin lesion 11/23/2018   CVA (cerebral vascular accident) [facility])    Gait disturbance 10/05/2015   pt declined neuro referral per records   Generalized weakness 05/15/2019   Hard of hearing 12/02/2015   hearing aids   Hyperlipidemia    Hypertension    Insomnia    "tried melatonin, advil PM, xanax and klonopin without success"   Leukocytosis 11/07/2013   seen oncology, did not want to continue to follow there, so continued follow up at PCP.   Osteoporosis    Polyneuropathy    Primary open angle glaucoma 01/2011   Spinal stenosis    Stable branch retinal vein occlusion 01/06/2012   Type 2 diabetes mellitus (HCC)    "diet controlled"   Past Surgical History:  Procedure Laterality Date   BUBBLE STUDY  05/17/2019   Procedure: BUBBLE STUDY;  Surgeon: Lenise Quince, MD;  Location: [facility] ENDOSCOPY;  Service: Cardiovascular;;   EYE SURGERY     SPINE SURGERY  2005   TEE WITHOUT CARDIOVERSION N/A 05/17/2019   Procedure: TRANSESOPHAGEAL ECHOCARDIOGRAM (TEE);  Surgeon: Lenise Quince,  MD;  Location: [facility] ENDOSCOPY;  Service: Cardiovascular;  Laterality: N/A;   TOTAL HIP ARTHROPLASTY Left 1991   TOTAL HIP ARTHROPLASTY Right ?   x 2   Family History  Problem Relation Age of Onset   Heart disease Brother    Cancer Other        prostate   Ovarian cancer Neg Hx    Breast cancer Neg Hx    Uterine cancer Neg Hx    Colon cancer Neg Hx    Allergies as of 08/11/2023       Reactions   Amlodipine Besylate Other (See Comments)   Vertigo   Influenza Virus Vaccine Swelling   Site of swelling not known by son        Medication List        Accurate as of Aug 11, 2023  1:50 PM. If you have any questions, ask your nurse or doctor.          clopidogrel  75 MG tablet Commonly known as: PLAVIX  Take 1 tablet (75 mg total) by mouth daily.   diclofenac 50 MG EC tablet Commonly known as: VOLTAREN Take 1 tablet (50 mg total) by mouth daily with breakfast. Started by: Julio Zappia   dorzolamide -timolol  2-0.5 % ophthalmic solution Commonly known as: COSOPT  Place 1 drop into both eyes 2 (two) times daily.   hydrALAZINE  25 MG tablet Commonly known as: APRESOLINE  Take 1 tablet (25 mg total) by mouth every 8 (eight) hours.   lisinopril  40 MG tablet Commonly known as: ZESTRIL  Take 1 tablet (40 mg total) by mouth daily.   metoprolol  succinate 25 MG 24 hr tablet Commonly known as: TOPROL -XL Take 1 tablet (25 mg total) by mouth daily.   Omega-3 300 MG Caps Take 1 tablet by mouth daily.   One-A-Cooter Womens 50 Plus Tabs Take 1 tablet by mouth daily.   polyethylene glycol powder 17 GM/SCOOP powder Commonly known as: GLYCOLAX/MIRALAX Take 17 g by mouth daily as needed for mild constipation.   pravastatin  40 MG tablet Commonly known as: PRAVACHOL  Take 1 tablet (40 mg total) by mouth daily.   Vitamin D -3 25 MCG (1000 UT) Caps Take 1,000 Units by mouth daily.   Vyzulta  0.024 % Soln Generic drug: Latanoprostene Bunod  Apply 1 drop to eye at bedtime.        All past medical history, surgical history, allergies, family history, immunizations and medications were updated in the EMR today and reviewed under the history and medication portions of their EMR.      ROS: Negative, with the exception of above mentioned in HPI  Objective:  BP 116/64   Pulse 60   Temp 98.1 F (36.7 C)   Wt 130 lb 9.6 oz (59.2 kg)   LMP 03/21/1978 (Approximate)   SpO2 97%   BMI 22.42 kg/m  Body mass index is 22.42 kg/m. Physical Exam Vitals and nursing note reviewed.  Constitutional:      General: She is not in acute distress.    Appearance: Normal appearance. She is not ill-appearing, toxic-appearing or diaphoretic.  HENT:     Head: Normocephalic and atraumatic.  Eyes:     General: No scleral icterus.       Right eye: No discharge.        Left eye: No discharge.     Extraocular Movements: Extraocular movements intact.     Conjunctiva/sclera: Conjunctivae normal.      Pupils: Pupils are equal, round, and reactive to light.  Cardiovascular:  Rate and Rhythm: Normal rate and regular rhythm.     Heart sounds: No murmur heard.    No friction rub. No gallop.  Pulmonary:     Effort: Pulmonary effort is normal. No respiratory distress.     Breath sounds: Normal breath sounds. No wheezing, rhonchi or rales.  Musculoskeletal:     Cervical back: Neck supple. No tenderness.     Right lower leg: No edema.     Left lower leg: No edema.  Lymphadenopathy:     Cervical: No cervical adenopathy.  Skin:    General: Skin is warm and dry.     Coloration: Skin is not jaundiced or pale.     Findings: No erythema or rash.  Neurological:     Mental Status: She is alert and oriented to person, place, and time. Mental status is at baseline.     Motor: No weakness.     Gait: Gait normal.  Psychiatric:        Mood and Affect: Mood normal.        Behavior: Behavior normal.        Thought Content: Thought content normal.        Judgment: Judgment normal.    Diabetic Foot Exam - Simple   No data filed     Assessment/Plan: Tiffine M Batz is a 89 y.o. female present for OV for Chronic Conditions/illness Management Essential hypertension/hyperlipidemia Stable Continue hydralazine  25 mg 3 times daily> she is ok with taking this every 8 hrs.   Continue  lisinopril  40 Continue   metoprolol  daily Continue   pravastatin  40 mg daily Labs: CBC, CMP  Stage 3 chronic kidney disease Avoid NSAIDs Renally dose meds when appropriate CMP collected  Monoclonal B-cell lymphocytosis: Patient has declined follow-up with heme-onc. Monitor CBC twice yearly CBC with differential collected  Cerebrovascular accident (CVA) due to stenosis of left middle cerebral artery (HCC)/Aneurysm of internal carotid artery/small vessel disease/cerebral atrophy/abnormal EEG/chronic anticoagulation Continue Plavix  indefinitely. Continue follow-ups with neurology   Aortic valve mass/Aortic  atherosclerosis (HCC)/aortic calcification -Referral to cardiology was placed in the past  Elevated TSH: -has been very mildly  Elevated tsh. Elected to watch instead of starting med.  - tsh -normal  Hip arthritis: Trial of low dose daily diclofenac EC  50 mg with food.  She understands to stop med immediately and be seen if she notices any increase frequency of bruising or BPR.   Elevated glucose/diet controlled DM: Diet controlled.  A1 C collected today Foot exam completed 02/2023 Eye exam UTD 01/2023  Reviewed expectations re: course of current medical issues. Discussed self-management of symptoms. Outlined signs and symptoms indicating need for more acute intervention. Patient verbalized understanding and all questions were answered. Patient received an After-Visit Summary. Any changes in medications were reviewed and patient was provided with updated med list with their AVS.     Orders Placed This Encounter  Procedures   Hemoglobin A1c   CBC w/Diff   Comp Met (CMET)   TSH   Meds ordered this encounter  Medications   hydrALAZINE  (APRESOLINE ) 25 MG tablet    Sig: Take 1 tablet (25 mg total) by mouth every 8 (eight) hours.    Dispense:  270 tablet    Refill:  1   metoprolol  succinate (TOPROL -XL) 25 MG 24 hr tablet    Sig: Take 1 tablet (25 mg total) by mouth daily.    Dispense:  90 tablet    Refill:  1   pravastatin  (PRAVACHOL ) 40  MG tablet    Sig: Take 1 tablet (40 mg total) by mouth daily.    Dispense:  90 tablet    Refill:  1   lisinopril  (ZESTRIL ) 40 MG tablet    Sig: Take 1 tablet (40 mg total) by mouth daily.    Dispense:  30 tablet    Refill:  0   clopidogrel  (PLAVIX ) 75 MG tablet    Sig: Take 1 tablet (75 mg total) by mouth daily.    Dispense:  30 tablet    Refill:  0   diclofenac (VOLTAREN) 50 MG EC tablet    Sig: Take 1 tablet (50 mg total) by mouth daily with breakfast.    Dispense:  90 tablet    Refill:  1   Referral Orders  No referral(s)  requested today    Note is dictated utilizing voice recognition software. Although note has been proof read prior to signing, occasional typographical errors still can be missed. If any questions arise, please do not hesitate to call for verification.   electronically signed by:  Napolean Backbone, DO  [facility] [place]

## 2023-08-15 ENCOUNTER — Ambulatory Visit: Payer: Self-pay | Admitting: Family Medicine

## 2023-08-17 NOTE — Telephone Encounter (Signed)
 Communication  Reason for CRM: Madison Martinez called in to return phone call. Lab results were relayed to him and he had no further questions.   Noted.

## 2023-08-25 ENCOUNTER — Other Ambulatory Visit: Payer: Self-pay | Admitting: Family Medicine

## 2023-09-20 ENCOUNTER — Encounter

## 2023-10-30 ENCOUNTER — Encounter: Payer: Self-pay | Admitting: Family Medicine

## 2023-10-30 ENCOUNTER — Ambulatory Visit (INDEPENDENT_AMBULATORY_CARE_PROVIDER_SITE_OTHER): Admitting: Family Medicine

## 2023-10-30 VITALS — BP 114/68 | HR 65 | Temp 98.2°F | Wt 130.8 lb

## 2023-10-30 DIAGNOSIS — R443 Hallucinations, unspecified: Secondary | ICD-10-CM

## 2023-10-30 DIAGNOSIS — F05 Delirium due to known physiological condition: Secondary | ICD-10-CM | POA: Diagnosis not present

## 2023-10-30 DIAGNOSIS — R4182 Altered mental status, unspecified: Secondary | ICD-10-CM

## 2023-10-30 MED ORDER — QUETIAPINE FUMARATE 25 MG PO TABS: 12.5000 mg | ORAL_TABLET | Freq: Every day | ORAL | 1 refills | Status: DC

## 2023-10-30 MED ORDER — CLOPIDOGREL BISULFATE 75 MG PO TABS: 75.0000 mg | ORAL_TABLET | Freq: Every day | ORAL | 1 refills | Status: DC

## 2023-10-30 MED ORDER — METOPROLOL SUCCINATE ER 25 MG PO TB24: 25.0000 mg | ORAL_TABLET | Freq: Every day | ORAL | 1 refills | Status: DC

## 2023-10-30 MED ORDER — PRAVASTATIN SODIUM 40 MG PO TABS: 40.0000 mg | ORAL_TABLET | Freq: Every day | ORAL | 1 refills | Status: DC

## 2023-10-30 MED ORDER — LISINOPRIL 40 MG PO TABS: 40.0000 mg | ORAL_TABLET | Freq: Every day | ORAL | 1 refills | Status: DC

## 2023-10-30 MED ORDER — HYDRALAZINE HCL 25 MG PO TABS: 25.0000 mg | ORAL_TABLET | Freq: Three times a day (TID) | ORAL | 1 refills | Status: DC

## 2023-10-30 NOTE — Progress Notes (Signed)
 Madison Martinez , 1927-09-10, 89 y.o., female MRN: [id number] Patient Care Team    Relationship Specialty Notifications Start End  Catherine Charlies LABOR, DO PCP - General Family Medicine  09/01/16   Girard Norway, MD Referring Physician Internal Medicine  09/01/16   Associates, [facility]  Ophthalmology  06/16/17   Jeffory Rockey BRAVO, DPM Referring Physician Podiatry  06/16/17   Maree Lonni Inks, MD Consulting Physician Ophthalmology  11/09/21     Chief Complaint  Patient presents with   Hallucinations    3 weeks, noticed at night. Causes sleep disturbance. Pt has not started any new medications.      Subjective: Madison Martinez is a 89 y.o. Pt presents for an OV with her son today with concerns for 3-week history of hallucinations Patient reports every night around 9 PM when she is trying to go to bed there are people in her room and they are pushing down on her legs.  She states it is uncomfortable.  She does not recognize the people.  She states her door is closed so they must fly into her room.  Although she does not see them fly. No other hallucinations present.  Only occurs at this particular time in the evening time just before bed.  She reports she does not sleep well anyway.  She sometimes will go multiple night with almost no sleep because she cannot fall asleep.  She used to take melatonin and, but she states she has not taken this for about 2 months. No other medication or over-the-counter supplements are new to her.  No recent illnesses.     08/11/2023    1:32 PM 09/02/2022    1:50 PM 08/31/2022   11:20 AM 08/18/2021   10:52 AM 08/12/2020   10:39 AM  Depression screen PHQ 2/9  Decreased Interest 2 0 0 0 0  Down, Depressed, Hopeless 1 0 0 0 0  PHQ - 2 Score 3 0 0 0 0  Altered sleeping 2      Tired, decreased energy 3      Change in appetite 0      Feeling bad or failure about yourself  0      Trouble concentrating 1      Moving slowly or fidgety/restless 1       Suicidal thoughts 0      PHQ-9 Score 10      Difficult doing work/chores Not difficult at all        Allergies  Allergen Reactions   Amlodipine Besylate Other (See Comments)    Vertigo   Influenza Virus Vaccine Swelling    Site of swelling not known by son   Social History   Social History Narrative   Divorced. 3 children Chantal, Max and Newell. Lives alone.   High school graduate.   Takes a daily vitamin.   Wears her seatbelt. Wears a hearing aid. Wears dentures.   Requires a cane for assistive device for walking.   Smoke detector in the home.   Feels safe in her relationships.   Past Medical History:  Diagnosis Date   Blind hypertensive eye, left 04/18/2012   Changing skin lesion 11/23/2018   CVA (cerebral vascular accident) [facility])    Gait disturbance 10/05/2015   pt declined neuro referral per records   Generalized weakness 05/15/2019   Hard of hearing 12/02/2015   hearing aids   Hyperlipidemia    Hypertension    Insomnia  tried melatonin, advil PM, xanax and klonopin without success   Leukocytosis 11/07/2013   seen oncology, did not want to continue to follow there, so continued follow up at PCP.   Osteoporosis    Polyneuropathy    Primary open angle glaucoma 01/2011   Spinal stenosis    Stable branch retinal vein occlusion 01/06/2012   Type 2 diabetes mellitus (HCC)    diet controlled   Past Surgical History:  Procedure Laterality Date   BUBBLE STUDY  05/17/2019   Procedure: BUBBLE STUDY;  Surgeon: Pietro Redell RAMAN, MD;  Location: [facility] ENDOSCOPY;  Service: Cardiovascular;;   EYE SURGERY     SPINE SURGERY  2005   TEE WITHOUT CARDIOVERSION N/A 05/17/2019   Procedure: TRANSESOPHAGEAL ECHOCARDIOGRAM (TEE);  Surgeon: Pietro Redell RAMAN, MD;  Location: [facility] ENDOSCOPY;  Service: Cardiovascular;  Laterality: N/A;   TOTAL HIP ARTHROPLASTY Left 1991   TOTAL HIP ARTHROPLASTY Right ?   x 2   Family History  Problem Relation Age of Onset   Heart disease Brother     Cancer Other        prostate   Ovarian cancer Neg Hx    Breast cancer Neg Hx    Uterine cancer Neg Hx    Colon cancer Neg Hx    Allergies as of 10/30/2023       Reactions   Amlodipine Besylate Other (See Comments)   Vertigo   Influenza Virus Vaccine Swelling   Site of swelling not known by son        Medication List        Accurate as of October 30, 2023 11:59 PM. If you have any questions, ask your nurse or doctor.          STOP taking these medications    diclofenac  50 MG EC tablet Commonly known as: VOLTAREN  Stopped by: Charlies Bellini       TAKE these medications    clopidogrel  75 MG tablet Commonly known as: PLAVIX  Take 1 tablet (75 mg total) by mouth daily.   dorzolamide -timolol  2-0.5 % ophthalmic solution Commonly known as: COSOPT  Place 1 drop into both eyes 2 (two) times daily.   hydrALAZINE  25 MG tablet Commonly known as: APRESOLINE  Take 1 tablet (25 mg total) by mouth every 8 (eight) hours.   lisinopril  40 MG tablet Commonly known as: ZESTRIL  Take 1 tablet (40 mg total) by mouth daily.   metoprolol  succinate 25 MG 24 hr tablet Commonly known as: TOPROL -XL Take 1 tablet (25 mg total) by mouth daily.   Omega-3 300 MG Caps Take 1 tablet by mouth daily.   One-A-Peary Womens 50 Plus Tabs Take 1 tablet by mouth daily.   polyethylene glycol powder 17 GM/SCOOP powder Commonly known as: GLYCOLAX/MIRALAX Take 17 g by mouth daily as needed for mild constipation.   pravastatin  40 MG tablet Commonly known as: PRAVACHOL  Take 1 tablet (40 mg total) by mouth daily.   QUEtiapine  25 MG tablet Commonly known as: SEROquel  Take 0.5-1 tablets (12.5-25 mg total) by mouth at bedtime. Started by: Terryon Pineiro   Vitamin D -3 25 MCG (1000 UT) Caps Take 1,000 Units by mouth daily.   Vyzulta  0.024 % Soln Generic drug: Latanoprostene Bunod  Apply 1 drop to eye at bedtime.        All past medical history, surgical history, allergies, family history,  immunizations andmedications were updated in the EMR today and reviewed under the history and medication portions of their EMR.     ROS Negative, with  the exception of above mentioned in HPI   Objective:  BP 114/68   Pulse 65   Temp 98.2 F (36.8 C)   Wt 130 lb 12.8 oz (59.3 kg)   LMP 03/21/1978 (Approximate)   SpO2 98%   BMI 22.45 kg/m  Body mass index is 22.45 kg/m. Physical Exam Vitals and nursing note reviewed.  Constitutional:      General: She is not in acute distress.    Appearance: Normal appearance. She is not ill-appearing, toxic-appearing or diaphoretic.     Comments: Wheelchair-bound  HENT:     Head: Normocephalic and atraumatic.  Eyes:     General: No scleral icterus.       Right eye: No discharge.        Left eye: No discharge.     Extraocular Movements: Extraocular movements intact.     Conjunctiva/sclera: Conjunctivae normal.     Pupils: Pupils are equal, round, and reactive to light.  Cardiovascular:     Rate and Rhythm: Normal rate and regular rhythm.  Pulmonary:     Effort: Pulmonary effort is normal. No respiratory distress.     Breath sounds: Normal breath sounds. No wheezing, rhonchi or rales.  Musculoskeletal:     Cervical back: Neck supple.     Right lower leg: No edema.     Left lower leg: No edema.  Skin:    General: Skin is warm.     Findings: No rash.  Neurological:     Mental Status: She is alert and oriented to person, place, and time. Mental status is at baseline.     Motor: No weakness.     Gait: Gait normal.  Psychiatric:        Mood and Affect: Mood normal.        Behavior: Behavior normal.        Thought Content: Thought content normal.        Judgment: Judgment normal.     No results found. No results found. No results found for this or any previous visit (from the past 24 hours).  Assessment/Plan: Tarryn Bogdan Marker is a 89 y.o. female present for OV for  Hallucinations (Primary)/delirium Hallucination is specific and  reoccurring.  Also occurs around the same time every [place].  Will collect labs today to rule out electrolyte disturbances, anemia and vitamin deficiencies as potential causes of altered mental status. Patient is oriented to place and time and at her normal baseline currently.  Suspect hallucinations may be secondary to sleeping pattern being disturbed. Will start with attempting to get sleeping pattern more routinely get her more restful sleep - CBC w/Diff - Comp Met (CMET) - TSH - PTH, Intact and Calcium - Vitamin D  (25 hydroxy) - B12 and Folate Panel - Vitamin B1 -Start Seroquel  12.5-25 mg 1 hour before bed. -Could consider CT/MRI if needed -Once all results are received we will call patient and arrange close follow-up.  If symptoms continue could consider testing for UTI, however with hallucination being so specific and only at a certain time of the night less likely UTI cause.  Refilled patient's chronic condition medications.  She seems to be having some difficulty getting this from her online pharmacy.  Continue Plavix , hydralazine , lisinopril , metoprolol , pravastatin    Reviewed expectations re: course of current medical issues. Discussed self-management of symptoms. Outlined signs and symptoms indicating need for more acute intervention. Patient verbalized understanding and all questions were answered. Patient received an After-Visit Summary.    Orders Placed This  Encounter  Procedures   CBC w/Diff   Comp Met (CMET)   TSH   PTH, Intact and Calcium   Vitamin D  (25 hydroxy)   B12 and Folate Panel   Vitamin B1   Meds ordered this encounter  Medications   clopidogrel  (PLAVIX ) 75 MG tablet    Sig: Take 1 tablet (75 mg total) by mouth daily.    Dispense:  90 tablet    Refill:  1   hydrALAZINE  (APRESOLINE ) 25 MG tablet    Sig: Take 1 tablet (25 mg total) by mouth every 8 (eight) hours.    Dispense:  270 tablet    Refill:  1   lisinopril  (ZESTRIL ) 40 MG tablet    Sig: Take  1 tablet (40 mg total) by mouth daily.    Dispense:  90 tablet    Refill:  1   metoprolol  succinate (TOPROL -XL) 25 MG 24 hr tablet    Sig: Take 1 tablet (25 mg total) by mouth daily.    Dispense:  90 tablet    Refill:  1   pravastatin  (PRAVACHOL ) 40 MG tablet    Sig: Take 1 tablet (40 mg total) by mouth daily.    Dispense:  90 tablet    Refill:  1   QUEtiapine  (SEROQUEL ) 25 MG tablet    Sig: Take 0.5-1 tablets (12.5-25 mg total) by mouth at bedtime.    Dispense:  90 tablet    Refill:  1   Referral Orders  No referral(s) requested today     Note is dictated utilizing voice recognition software. Although note has been proof read prior to signing, occasional typographical errors still can be missed. If any questions arise, please do not hesitate to call for verification.   electronically signed by:  Charlies Bellini, DO  [facility] - [place]

## 2023-10-31 LAB — TSH: TSH: 4.67 u[IU]/mL (ref 0.35–5.50)

## 2023-10-31 LAB — CBC WITH DIFFERENTIAL/PLATELET
Basophils Absolute: 0.1 K/uL (ref 0.0–0.1)
Basophils Relative: 0.9 % (ref 0.0–3.0)
Eosinophils Absolute: 0.3 K/uL (ref 0.0–0.7)
Eosinophils Relative: 3.2 % (ref 0.0–5.0)
HCT: 37.4 % (ref 36.0–46.0)
Hemoglobin: 12.3 g/dL (ref 12.0–15.0)
Lymphocytes Relative: 44.7 % (ref 12.0–46.0)
Lymphs Abs: 4.1 K/uL — ABNORMAL HIGH (ref 0.7–4.0)
MCHC: 33 g/dL (ref 30.0–36.0)
MCV: 94.3 fl (ref 78.0–100.0)
Monocytes Absolute: 0.6 K/uL (ref 0.1–1.0)
Monocytes Relative: 6.3 % (ref 3.0–12.0)
Neutro Abs: 4.1 K/uL (ref 1.4–7.7)
Neutrophils Relative %: 44.9 % (ref 43.0–77.0)
Platelets: 202 K/uL (ref 150.0–400.0)
RBC: 3.97 Mil/uL (ref 3.87–5.11)
RDW: 13.7 % (ref 11.5–15.5)
WBC: 9.2 K/uL (ref 4.0–10.5)

## 2023-10-31 LAB — COMPREHENSIVE METABOLIC PANEL WITH GFR
ALT: 12 U/L (ref 0–35)
AST: 22 U/L (ref 0–37)
Albumin: 4.2 g/dL (ref 3.5–5.2)
Alkaline Phosphatase: 78 U/L (ref 39–117)
BUN: 22 mg/dL (ref 6–23)
CO2: 30 meq/L (ref 19–32)
Calcium: 9.7 mg/dL (ref 8.4–10.5)
Chloride: 103 meq/L (ref 96–112)
Creatinine, Ser: 1.09 mg/dL (ref 0.40–1.20)
GFR: 43.57 mL/min — ABNORMAL LOW (ref >60.00)
Glucose, Bld: 119 mg/dL — ABNORMAL HIGH (ref 70–99)
Potassium: 4.1 meq/L (ref 3.5–5.1)
Sodium: 140 meq/L (ref 135–145)
Total Bilirubin: 0.4 mg/dL (ref 0.2–1.2)
Total Protein: 7.2 g/dL (ref 6.0–8.3)

## 2023-10-31 LAB — VITAMIN D 25 HYDROXY (VIT D DEFICIENCY, FRACTURES): VITD: 84.54 ng/mL (ref 30.00–100.00)

## 2023-10-31 LAB — B12 AND FOLATE PANEL
Folate: >23.4 ng/mL (ref >5.9)
Vitamin B-12: 695 pg/mL (ref 211–911)

## 2023-10-31 NOTE — Patient Instructions (Signed)

## 2023-11-01 ENCOUNTER — Ambulatory Visit: Payer: Self-pay | Admitting: Family Medicine

## 2023-11-02 LAB — VITAMIN B1

## 2023-11-02 LAB — PTH, INTACT AND CALCIUM
Calcium: 9.6 mg/dL (ref 8.6–10.4)
PTH: 13 pg/mL — ABNORMAL LOW (ref 16–77)

## 2023-11-02 NOTE — Telephone Encounter (Signed)
 Communication  Reason for CRM: Patient's son is returning Madison Martinez or Madison Martinez's call to discuss his mothers' lab results.   Pt aware and verbalized understanding

## 2023-11-13 ENCOUNTER — Encounter: Payer: Self-pay | Admitting: Family Medicine

## 2023-11-13 ENCOUNTER — Ambulatory Visit (INDEPENDENT_AMBULATORY_CARE_PROVIDER_SITE_OTHER): Admitting: Family Medicine

## 2023-11-13 VITALS — BP 120/62 | HR 65 | Temp 98.3°F | Wt 130.0 lb

## 2023-11-13 DIAGNOSIS — C4490 Unspecified malignant neoplasm of skin, unspecified: Secondary | ICD-10-CM

## 2023-11-13 MED ORDER — QUETIAPINE FUMARATE 25 MG PO TABS: 50.0000 mg | ORAL_TABLET | Freq: Every day | ORAL | 1 refills | Status: DC

## 2023-11-13 NOTE — Patient Instructions (Addendum)

## 2023-11-13 NOTE — Progress Notes (Signed)
 Madison Martinez , [birth year], 89 y.o., female MRN: [id number] Patient Care Team    Relationship Specialty Notifications Start End  Catherine Charlies LABOR, DO PCP - General Family Medicine  09/01/16   Girard Norway, MD Referring Physician Internal Medicine  09/01/16   Associates, [facility]  Ophthalmology  06/16/17   Jeffory Rockey BRAVO, DPM Referring Physician Podiatry  06/16/17   Maree Lonni Inks, MD Consulting Physician Ophthalmology  11/09/21     Chief Complaint  Patient presents with   Sleep disturbance     Subjective: Madison Martinez is a 89 y.o. Pt presents for an OV with her son today for follow-up on sleep disturbance with delirium/hallucinations.  She has tapered up to Seroquel  25 mg nightly and reports it has not made any difference. Lab workup was essentially normal with normal folate, vitamin D , B12, blood cell counts, electrolytes and kidney function and thyroid  function.  Prior note Patient reports every night around 9 PM when she is trying to go to bed there are people in her room and they are pushing down on her legs.  She states it is uncomfortable.  She does not recognize the people.  She states her door is closed so they must fly into her room.  Although she does not see them fly. No other hallucinations present.  Only occurs at this particular time in the evening time just before bed.  She reports she does not sleep well anyway.  She sometimes will go multiple night with almost no sleep because she cannot fall asleep.  She used to take melatonin and, but she states she has not taken this for about 2 months. No other medication or over-the-counter supplements are new to her.  No recent illnesses.  The lesion on the back of her left hand is scabbed over with dried blood today.  She reports she caught it on something.  Does appear consistent with squamous cell carcinoma with skin.  She does have a history of SCC.  Her prior dermatologist is no longer in practice.      08/11/2023    1:32 PM 09/02/2022    1:50 PM 08/31/2022   11:20 AM 08/18/2021   10:52 AM 08/12/2020   10:39 AM  Depression screen PHQ 2/9  Decreased Interest 2 0 0 0 0  Down, Depressed, Hopeless 1 0 0 0 0  PHQ - 2 Score 3 0 0 0 0  Altered sleeping 2      Tired, decreased energy 3      Change in appetite 0      Feeling bad or failure about yourself  0      Trouble concentrating 1      Moving slowly or fidgety/restless 1      Suicidal thoughts 0      PHQ-9 Score 10      Difficult doing work/chores Not difficult at all        Allergies  Allergen Reactions   Amlodipine Besylate Other (See Comments)    Vertigo   Influenza Virus Vaccine Swelling    Site of swelling not known by son   Social History   Social History Narrative   Divorced. 3 children Chantal, Max and Newell. Lives alone.   High school graduate.   Takes a daily vitamin.   Wears her seatbelt. Wears a hearing aid. Wears dentures.   Requires a cane for assistive device for walking.   Smoke detector in the home.  Feels safe in her relationships.   Past Medical History:  Diagnosis Date   Blind hypertensive eye, left 04/18/2012   Changing skin lesion 11/23/2018   CVA (cerebral vascular accident) [facility])    Gait disturbance 10/05/2015   pt declined neuro referral per records   Generalized weakness 05/15/2019   Hard of hearing 12/02/2015   hearing aids   Hyperlipidemia    Hypertension    Insomnia    tried melatonin, advil PM, xanax and klonopin without success   Leukocytosis 11/07/2013   seen oncology, did not want to continue to follow there, so continued follow up at PCP.   Osteoporosis    Polyneuropathy    Primary open angle glaucoma 01/2011   Spinal stenosis    Stable branch retinal vein occlusion 01/06/2012   Type 2 diabetes mellitus (HCC)    diet controlled   Past Surgical History:  Procedure Laterality Date   BUBBLE STUDY  05/17/2019   Procedure: BUBBLE STUDY;  Surgeon: Pietro Redell RAMAN, MD;  Location:  [facility] ENDOSCOPY;  Service: Cardiovascular;;   EYE SURGERY     SPINE SURGERY  2005   TEE WITHOUT CARDIOVERSION N/A 05/17/2019   Procedure: TRANSESOPHAGEAL ECHOCARDIOGRAM (TEE);  Surgeon: Pietro Redell RAMAN, MD;  Location: [facility] ENDOSCOPY;  Service: Cardiovascular;  Laterality: N/A;   TOTAL HIP ARTHROPLASTY Left 1991   TOTAL HIP ARTHROPLASTY Right ?   x 2   Family History  Problem Relation Age of Onset   Heart disease Brother    Cancer Other        prostate   Ovarian cancer Neg Hx    Breast cancer Neg Hx    Uterine cancer Neg Hx    Colon cancer Neg Hx    Allergies as of 11/13/2023       Reactions   Amlodipine Besylate Other (See Comments)   Vertigo   Influenza Virus Vaccine Swelling   Site of swelling not known by son        Medication List        Accurate as of November 13, 2023  2:53 PM. If you have any questions, ask your nurse or doctor.          clopidogrel  75 MG tablet Commonly known as: PLAVIX  Take 1 tablet (75 mg total) by mouth daily.   dorzolamide -timolol  2-0.5 % ophthalmic solution Commonly known as: COSOPT  Place 1 drop into both eyes 2 (two) times daily.   hydrALAZINE  25 MG tablet Commonly known as: APRESOLINE  Take 1 tablet (25 mg total) by mouth every 8 (eight) hours.   lisinopril  40 MG tablet Commonly known as: ZESTRIL  Take 1 tablet (40 mg total) by mouth daily.   metoprolol  succinate 25 MG 24 hr tablet Commonly known as: TOPROL -XL Take 1 tablet (25 mg total) by mouth daily.   Omega-3 300 MG Caps Take 1 tablet by mouth daily.   One-A-Dysert Womens 50 Plus Tabs Take 1 tablet by mouth daily.   polyethylene glycol powder 17 GM/SCOOP powder Commonly known as: GLYCOLAX/MIRALAX Take 17 g by mouth daily as needed for mild constipation.   pravastatin  40 MG tablet Commonly known as: PRAVACHOL  Take 1 tablet (40 mg total) by mouth daily.   QUEtiapine  25 MG tablet Commonly known as: SEROquel  Take 2-3 tablets (50-75 mg total) by mouth at bedtime. What  changed: how much to take Changed by: Charlies Bellini   Vitamin D -3 25 MCG (1000 UT) Caps Take 1,000 Units by mouth daily.   Vyzulta  0.024 % Soln Generic drug:  Latanoprostene Bunod  Apply 1 drop to eye at bedtime.        All past medical history, surgical history, allergies, family history, immunizations andmedications were updated in the EMR today and reviewed under the history and medication portions of their EMR.     ROS Negative, with the exception of above mentioned in HPI   Objective:  BP 120/62   Pulse 65   Temp 98.3 F (36.8 C)   Wt 130 lb (59 kg)   LMP 03/21/1978 (Approximate)   SpO2 97%   BMI 22.31 kg/m  Body mass index is 22.31 kg/m. Physical Exam Vitals and nursing note reviewed.  Constitutional:      General: She is not in acute distress.    Appearance: Normal appearance. She is not ill-appearing, toxic-appearing or diaphoretic.     Comments: Wheelchair-bound  HENT:     Head: Normocephalic and atraumatic.  Eyes:     General: No scleral icterus.       Right eye: No discharge.        Left eye: No discharge.     Extraocular Movements: Extraocular movements intact.     Conjunctiva/sclera: Conjunctivae normal.     Pupils: Pupils are equal, round, and reactive to light.  Cardiovascular:     Rate and Rhythm: Normal rate and regular rhythm.  Pulmonary:     Effort: Pulmonary effort is normal. No respiratory distress.     Breath sounds: Normal breath sounds. No wheezing, rhonchi or rales.  Musculoskeletal:     Cervical back: Neck supple.     Right lower leg: No edema.     Left lower leg: No edema.  Skin:    General: Skin is warm.     Findings: No rash.  Neurological:     Mental Status: She is alert and oriented to person, place, and time. Mental status is at baseline.     Motor: No weakness.     Gait: Gait normal.  Psychiatric:        Mood and Affect: Mood normal.        Behavior: Behavior normal.        Thought Content: Thought content normal.         Judgment: Judgment normal.     No results found. No results found. No results found for this or any previous visit (from the past 24 hours).  Assessment/Plan: Nayda Riesen Nicolini is a 89 y.o. female present for OV for  Hallucinations (Primary)/delirium/sleep disturbance Hallucination is specific and reoccurring.  Also occurs around the same time every [place].  Labs all reassuring. Patient is oriented to place and time and at her normal baseline currently.   Suspect hallucinations may be secondary to sleeping pattern being disturbed. Increase Seroquel  50-75 mg 1 hour before bed. -Could consider CT/MRI if needed   Squamous cell carcinoma: Referral to new dermatologist urgently.  Reviewed expectations re: course of current medical issues. Discussed self-management of symptoms. Outlined signs and symptoms indicating need for more acute intervention. Patient verbalized understanding and all questions were answered. Patient received an After-Visit Summary.    Orders Placed This Encounter  Procedures   Ambulatory referral to Dermatology   Meds ordered this encounter  Medications   QUEtiapine  (SEROQUEL ) 25 MG tablet    Sig: Take 2-3 tablets (50-75 mg total) by mouth at bedtime.    Dispense:  270 tablet    Refill:  1   Referral Orders         Ambulatory referral to Dermatology  Note is dictated utilizing voice recognition software. Although note has been proof read prior to signing, occasional typographical errors still can be missed. If any questions arise, please do not hesitate to call for verification.   electronically signed by:  Charlies Bellini, DO  [facility] [place]

## 2023-12-11 ENCOUNTER — Emergency Department (HOSPITAL_COMMUNITY)

## 2023-12-11 ENCOUNTER — Ambulatory Visit: Payer: Self-pay

## 2023-12-11 ENCOUNTER — Observation Stay (HOSPITAL_COMMUNITY)
Admission: EM | Admit: 2023-12-11 | Discharge: 2023-12-14 | Disposition: A | Discharge status: Skilled Nursing Facility | Attending: Emergency Medicine | Admitting: Emergency Medicine

## 2023-12-11 ENCOUNTER — Other Ambulatory Visit: Payer: Self-pay

## 2023-12-11 DIAGNOSIS — E785 Hyperlipidemia, unspecified: Secondary | ICD-10-CM | POA: Diagnosis not present

## 2023-12-11 DIAGNOSIS — D631 Anemia in chronic kidney disease: Secondary | ICD-10-CM | POA: Insufficient documentation

## 2023-12-11 DIAGNOSIS — E1122 Type 2 diabetes mellitus with diabetic chronic kidney disease: Secondary | ICD-10-CM | POA: Insufficient documentation

## 2023-12-11 DIAGNOSIS — T84021A Dislocation of internal left hip prosthesis, initial encounter: Secondary | ICD-10-CM | POA: Diagnosis not present

## 2023-12-11 DIAGNOSIS — N183 Chronic kidney disease, stage 3 unspecified: Secondary | ICD-10-CM | POA: Diagnosis present

## 2023-12-11 DIAGNOSIS — W19XXXA Unspecified fall, initial encounter: Secondary | ICD-10-CM | POA: Diagnosis not present

## 2023-12-11 DIAGNOSIS — M8580 Other specified disorders of bone density and structure, unspecified site: Secondary | ICD-10-CM | POA: Diagnosis not present

## 2023-12-11 DIAGNOSIS — S0990XA Unspecified injury of head, initial encounter: Secondary | ICD-10-CM | POA: Diagnosis not present

## 2023-12-11 DIAGNOSIS — Z96649 Presence of unspecified artificial hip joint: Principal | ICD-10-CM

## 2023-12-11 DIAGNOSIS — E119 Type 2 diabetes mellitus without complications: Secondary | ICD-10-CM

## 2023-12-11 DIAGNOSIS — R2681 Unsteadiness on feet: Secondary | ICD-10-CM | POA: Diagnosis not present

## 2023-12-11 DIAGNOSIS — I700 Atherosclerosis of aorta: Secondary | ICD-10-CM | POA: Diagnosis not present

## 2023-12-11 DIAGNOSIS — M6281 Muscle weakness (generalized): Secondary | ICD-10-CM | POA: Diagnosis not present

## 2023-12-11 DIAGNOSIS — Z79899 Other long term (current) drug therapy: Secondary | ICD-10-CM | POA: Diagnosis not present

## 2023-12-11 DIAGNOSIS — M816 Localized osteoporosis [Lequesne]: Secondary | ICD-10-CM | POA: Insufficient documentation

## 2023-12-11 DIAGNOSIS — D72829 Elevated white blood cell count, unspecified: Secondary | ICD-10-CM | POA: Diagnosis not present

## 2023-12-11 DIAGNOSIS — M7989 Other specified soft tissue disorders: Secondary | ICD-10-CM | POA: Diagnosis not present

## 2023-12-11 DIAGNOSIS — T84029A Dislocation of unspecified internal joint prosthesis, initial encounter: Principal | ICD-10-CM

## 2023-12-11 DIAGNOSIS — F0390 Unspecified dementia without behavioral disturbance: Secondary | ICD-10-CM | POA: Insufficient documentation

## 2023-12-11 DIAGNOSIS — H35039 Hypertensive retinopathy, unspecified eye: Secondary | ICD-10-CM | POA: Diagnosis present

## 2023-12-11 DIAGNOSIS — M85872 Other specified disorders of bone density and structure, left ankle and foot: Secondary | ICD-10-CM | POA: Diagnosis not present

## 2023-12-11 DIAGNOSIS — I10 Essential (primary) hypertension: Secondary | ICD-10-CM | POA: Diagnosis not present

## 2023-12-11 DIAGNOSIS — G319 Degenerative disease of nervous system, unspecified: Secondary | ICD-10-CM | POA: Diagnosis not present

## 2023-12-11 DIAGNOSIS — N1832 Chronic kidney disease, stage 3b: Secondary | ICD-10-CM | POA: Diagnosis present

## 2023-12-11 DIAGNOSIS — I771 Stricture of artery: Secondary | ICD-10-CM | POA: Diagnosis not present

## 2023-12-11 DIAGNOSIS — S73005A Unspecified dislocation of left hip, initial encounter: Secondary | ICD-10-CM | POA: Diagnosis not present

## 2023-12-11 DIAGNOSIS — S199XXA Unspecified injury of neck, initial encounter: Secondary | ICD-10-CM | POA: Diagnosis not present

## 2023-12-11 DIAGNOSIS — I6381 Other cerebral infarction due to occlusion or stenosis of small artery: Secondary | ICD-10-CM | POA: Diagnosis not present

## 2023-12-11 DIAGNOSIS — G629 Polyneuropathy, unspecified: Secondary | ICD-10-CM

## 2023-12-11 DIAGNOSIS — Z96642 Presence of left artificial hip joint: Secondary | ICD-10-CM | POA: Diagnosis not present

## 2023-12-11 DIAGNOSIS — T84020A Dislocation of internal right hip prosthesis, initial encounter: Secondary | ICD-10-CM | POA: Diagnosis not present

## 2023-12-11 DIAGNOSIS — I129 Hypertensive chronic kidney disease with stage 1 through stage 4 chronic kidney disease, or unspecified chronic kidney disease: Secondary | ICD-10-CM | POA: Diagnosis not present

## 2023-12-11 DIAGNOSIS — Z043 Encounter for examination and observation following other accident: Secondary | ICD-10-CM | POA: Diagnosis not present

## 2023-12-11 DIAGNOSIS — Z743 Need for continuous supervision: Secondary | ICD-10-CM | POA: Diagnosis not present

## 2023-12-11 LAB — URINALYSIS, W/ REFLEX TO CULTURE (INFECTION SUSPECTED)
Bilirubin Urine: NEGATIVE
Glucose, UA: NEGATIVE mg/dL
Ketones, ur: 5 mg/dL — AB
Nitrite: NEGATIVE
Protein, ur: 100 mg/dL — AB
Specific Gravity, Urine: 1.012 (ref 1.005–1.030)
WBC, UA: >50 WBC/hpf (ref 0–5)
pH: 6 (ref 5.0–8.0)

## 2023-12-11 LAB — CBC WITH DIFFERENTIAL/PLATELET
Abs Immature Granulocytes: 0.06 K/uL (ref 0.00–0.07)
Basophils Absolute: 0.1 K/uL (ref 0.0–0.1)
Basophils Relative: 0 %
Eosinophils Absolute: 0 K/uL (ref 0.0–0.5)
Eosinophils Relative: 0 %
HCT: 39.8 % (ref 36.0–46.0)
Hemoglobin: 13 g/dL (ref 12.0–15.0)
Immature Granulocytes: 0 %
Lymphocytes Relative: 29 %
Lymphs Abs: 4.2 K/uL — ABNORMAL HIGH (ref 0.7–4.0)
MCH: 30.8 pg (ref 26.0–34.0)
MCHC: 32.7 g/dL (ref 30.0–36.0)
MCV: 94.3 fL (ref 80.0–100.0)
Monocytes Absolute: 0.9 K/uL (ref 0.1–1.0)
Monocytes Relative: 6 %
Neutro Abs: 9.1 K/uL — ABNORMAL HIGH (ref 1.7–7.7)
Neutrophils Relative %: 65 %
Platelets: 309 K/uL (ref 150–400)
RBC: 4.22 MIL/uL (ref 3.87–5.11)
RDW: 12.4 % (ref 11.5–15.5)
WBC: 14.3 K/uL — ABNORMAL HIGH (ref 4.0–10.5)
nRBC: 0 % (ref 0.0–0.2)

## 2023-12-11 LAB — PROTIME-INR
INR: 1 (ref 0.8–1.2)
Prothrombin Time: 13.5 s (ref 11.4–15.2)

## 2023-12-11 LAB — BASIC METABOLIC PANEL WITH GFR
Anion gap: 14 (ref 5–15)
BUN: 21 mg/dL (ref 8–23)
CO2: 20 mmol/L — ABNORMAL LOW (ref 22–32)
Calcium: 9.5 mg/dL (ref 8.9–10.3)
Chloride: 101 mmol/L (ref 98–111)
Creatinine, Ser: 1.18 mg/dL — ABNORMAL HIGH (ref 0.44–1.00)
GFR, Estimated: 43 mL/min — ABNORMAL LOW (ref >60)
Glucose, Bld: 152 mg/dL — ABNORMAL HIGH (ref 70–99)
Potassium: 3.9 mmol/L (ref 3.5–5.1)
Sodium: 135 mmol/L (ref 135–145)

## 2023-12-11 LAB — I-STAT CHEM 8, ED
BUN: 23 mg/dL (ref 8–23)
Calcium, Ion: 1.23 mmol/L (ref 1.15–1.40)
Chloride: 106 mmol/L (ref 98–111)
Creatinine, Ser: 1.3 mg/dL — ABNORMAL HIGH (ref 0.44–1.00)
Glucose, Bld: 161 mg/dL — ABNORMAL HIGH (ref 70–99)
HCT: 40 % (ref 36.0–46.0)
Hemoglobin: 13.6 g/dL (ref 12.0–15.0)
Potassium: 4.1 mmol/L (ref 3.5–5.1)
Sodium: 139 mmol/L (ref 135–145)
TCO2: 20 mmol/L — ABNORMAL LOW (ref 22–32)

## 2023-12-11 LAB — TYPE AND SCREEN
ABO/RH(D): O POS
Antibody Screen: NEGATIVE

## 2023-12-11 LAB — GLUCOSE, CAPILLARY: Glucose-Capillary: 126 mg/dL — ABNORMAL HIGH (ref 70–99)

## 2023-12-11 MED ORDER — ENOXAPARIN SODIUM 30 MG/0.3ML IJ SOSY
30.0000 mg | PREFILLED_SYRINGE | INTRAMUSCULAR | Status: DC
  Administered 2023-12-11 – 2023-12-13 (×3): 30 mg via SUBCUTANEOUS
  Filled 2023-12-11 (×3): qty 0.3

## 2023-12-11 MED ORDER — ACETAMINOPHEN 650 MG RE SUPP: 650.0000 mg | Freq: Four times a day (QID) | RECTAL | Status: DC | PRN

## 2023-12-11 MED ORDER — ONDANSETRON HCL 4 MG/2ML IJ SOLN: 4.0000 mg | Freq: Four times a day (QID) | INTRAMUSCULAR | Status: DC | PRN

## 2023-12-11 MED ORDER — LATANOPROST 0.005 % OP SOLN
1.0000 [drp] | Freq: Every day | OPHTHALMIC | Status: DC
  Administered 2023-12-11 – 2023-12-13 (×3): 1 [drp] via OPHTHALMIC
  Filled 2023-12-11: qty 2.5

## 2023-12-11 MED ORDER — PRAVASTATIN SODIUM 40 MG PO TABS
40.0000 mg | ORAL_TABLET | Freq: Every day | ORAL | Status: DC
  Administered 2023-12-12 – 2023-12-14 (×3): 40 mg via ORAL
  Filled 2023-12-11 (×3): qty 1

## 2023-12-11 MED ORDER — ADULT MULTIVITAMIN W/MINERALS CH
1.0000 | ORAL_TABLET | Freq: Every day | ORAL | Status: DC
Start: 2023-12-12 — End: 2023-12-14
  Administered 2023-12-12 – 2023-12-14 (×3): 1 via ORAL
  Filled 2023-12-11 (×3): qty 1

## 2023-12-11 MED ORDER — POLYETHYLENE GLYCOL 3350 17 G PO PACK
17.0000 g | PACK | Freq: Every day | ORAL | Status: DC | PRN
  Administered 2023-12-14: 17 g via ORAL
  Filled 2023-12-11: qty 1

## 2023-12-11 MED ORDER — OMEGA-3-ACID ETHYL ESTERS 1 G PO CAPS
1.0000 | ORAL_CAPSULE | Freq: Every day | ORAL | Status: DC
  Administered 2023-12-12 – 2023-12-14 (×3): 1 g via ORAL
  Filled 2023-12-11 (×3): qty 1

## 2023-12-11 MED ORDER — METOPROLOL SUCCINATE ER 25 MG PO TB24
25.0000 mg | ORAL_TABLET | Freq: Every day | ORAL | Status: DC
  Administered 2023-12-12 – 2023-12-14 (×3): 25 mg via ORAL
  Filled 2023-12-11 (×3): qty 1

## 2023-12-11 MED ORDER — FENTANYL CITRATE PF 50 MCG/ML IJ SOSY
50.0000 ug | PREFILLED_SYRINGE | INTRAMUSCULAR | Status: DC | PRN
  Filled 2023-12-11: qty 1

## 2023-12-11 MED ORDER — LACTATED RINGERS IV SOLN: INTRAVENOUS | Status: DC

## 2023-12-11 MED ORDER — ACETAMINOPHEN 325 MG PO TABS
650.0000 mg | ORAL_TABLET | Freq: Four times a day (QID) | ORAL | Status: DC | PRN
  Administered 2023-12-12 – 2023-12-13 (×3): 650 mg via ORAL
  Filled 2023-12-11 (×3): qty 2

## 2023-12-11 MED ORDER — SODIUM CHLORIDE 0.9 % IV SOLN: INTRAVENOUS | Status: DC

## 2023-12-11 MED ORDER — KETOROLAC TROMETHAMINE 15 MG/ML IJ SOLN: 15.0000 mg | Freq: Four times a day (QID) | INTRAMUSCULAR | Status: DC | PRN

## 2023-12-11 MED ORDER — ONDANSETRON HCL 4 MG PO TABS: 4.0000 mg | ORAL_TABLET | Freq: Four times a day (QID) | ORAL | Status: DC | PRN

## 2023-12-11 MED ORDER — LATANOPROSTENE BUNOD 0.024 % OP SOLN: 1.0000 [drp] | Freq: Every day | OPHTHALMIC | Status: DC

## 2023-12-11 MED ORDER — LISINOPRIL 20 MG PO TABS
40.0000 mg | ORAL_TABLET | Freq: Every day | ORAL | Status: DC
  Administered 2023-12-12 – 2023-12-14 (×3): 40 mg via ORAL
  Filled 2023-12-11 (×3): qty 2

## 2023-12-11 MED ORDER — DORZOLAMIDE HCL-TIMOLOL MAL 2-0.5 % OP SOLN
1.0000 [drp] | Freq: Two times a day (BID) | OPHTHALMIC | Status: DC
  Administered 2023-12-11 – 2023-12-14 (×6): 1 [drp] via OPHTHALMIC
  Filled 2023-12-11: qty 10

## 2023-12-11 MED ORDER — CLOPIDOGREL BISULFATE 75 MG PO TABS
75.0000 mg | ORAL_TABLET | Freq: Every day | ORAL | Status: DC
  Administered 2023-12-12 – 2023-12-14 (×3): 75 mg via ORAL
  Filled 2023-12-11 (×3): qty 1

## 2023-12-11 MED ORDER — HYDRALAZINE HCL 25 MG PO TABS
25.0000 mg | ORAL_TABLET | Freq: Three times a day (TID) | ORAL | Status: DC
  Administered 2023-12-11 – 2023-12-14 (×8): 25 mg via ORAL
  Filled 2023-12-11 (×8): qty 1

## 2023-12-11 MED ORDER — QUETIAPINE FUMARATE 25 MG PO TABS
50.0000 mg | ORAL_TABLET | Freq: Every day | ORAL | Status: DC
  Administered 2023-12-11 – 2023-12-13 (×3): 50 mg via ORAL
  Filled 2023-12-11 (×3): qty 2

## 2023-12-11 MED ORDER — VITAMIN D 25 MCG (1000 UNIT) PO TABS
1000.0000 [IU] | ORAL_TABLET | Freq: Every day | ORAL | Status: DC
  Administered 2023-12-12 – 2023-12-14 (×3): 1000 [IU] via ORAL
  Filled 2023-12-11 (×3): qty 1

## 2023-12-11 MED ORDER — PROPOFOL 10 MG/ML IV BOLUS
0.5000 mg/kg | Freq: Once | INTRAVENOUS | Status: DC
  Filled 2023-12-11: qty 20

## 2023-12-11 MED ORDER — PROPOFOL 10 MG/ML IV BOLUS
INTRAVENOUS | Status: AC | PRN
Start: 2023-12-11 — End: 2023-12-11
  Administered 2023-12-11: 30 mg via INTRAVENOUS

## 2023-12-11 NOTE — ED Notes (Signed)
 Pharm called, per pharmacist unable to verify propofol  at this time. MD notified.

## 2023-12-11 NOTE — ED Notes (Signed)
 Written consent for conscious sedation obtained from pt son due to dementia and verified by RN.

## 2023-12-11 NOTE — H&P (Signed)
 History and Physical    Patient: Madison Martinez FMW:[id number] DOB: January 30, 1928 DOA: 12/11/2023 DOS: the patient was seen and examined on 12/11/2023 PCP: Catherine Charlies LABOR, DO  Patient coming from: Home  Chief Complaint:  Chief Complaint  Patient presents with   Fall   HPI: Madison Martinez is a 89 y.o. female with medical history significant of diabetes, essential hypertension, hyperlipidemia, left eye blindness, history of CVA with gait disturbance, osteoporosis, polyneuropathy, spinal stenosis who presented to the ER after sustaining a fall at home.  Patient was apparently found on the ground.  Not sure what happened but suspected mechanical fall versus presyncope.  Patient was found to have dislocated left hip prosthesis.  Her left hip is showing dislocation as well as significant pain.  Patient unable to put weight on it.  Was on the ground for unknown period of time.  EMS noted urinary incontinence when they arrived.  Patient did not hit her head.  She is on Plavix .  While in the ER x-rays confirmed the dislocation.  Patient was sedated with reduction performed successfully.  Patient is being admitted for observation and pain control.  Review of Systems: As mentioned in the history of present illness. All other systems reviewed and are negative. Past Medical History:  Diagnosis Date   Blind hypertensive eye, left 04/18/2012   Changing skin lesion 11/23/2018   CVA (cerebral vascular accident) [facility])    Gait disturbance 10/05/2015   pt declined neuro referral per records   Generalized weakness 05/15/2019   Hard of hearing 12/02/2015   hearing aids   Hyperlipidemia    Hypertension    Insomnia    tried melatonin, advil PM, xanax and klonopin without success   Leukocytosis 11/07/2013   seen oncology, did not want to continue to follow there, so continued follow up at PCP.   Osteoporosis    Polyneuropathy    Primary open angle glaucoma 01/2011   Spinal stenosis    Stable branch retinal vein  occlusion 01/06/2012   Type 2 diabetes mellitus (HCC)    diet controlled   Past Surgical History:  Procedure Laterality Date   BUBBLE STUDY  05/17/2019   Procedure: BUBBLE STUDY;  Surgeon: Pietro Redell RAMAN, MD;  Location: [facility] ENDOSCOPY;  Service: Cardiovascular;;   EYE SURGERY     SPINE SURGERY  2005   TEE WITHOUT CARDIOVERSION N/A 05/17/2019   Procedure: TRANSESOPHAGEAL ECHOCARDIOGRAM (TEE);  Surgeon: Pietro Redell RAMAN, MD;  Location: [facility] ENDOSCOPY;  Service: Cardiovascular;  Laterality: N/A;   TOTAL HIP ARTHROPLASTY Left 1991   TOTAL HIP ARTHROPLASTY Right ?   x 2   Social History:  reports that she has never smoked. She has never used smokeless tobacco. She reports that she does not drink alcohol and does not use drugs.  Allergies  Allergen Reactions   Amlodipine Besylate Other (See Comments)    Vertigo   Influenza Virus Vaccine Swelling    Site of swelling not known by son    Family History  Problem Relation Age of Onset   Heart disease Brother    Cancer Other        prostate   Ovarian cancer Neg Hx    Breast cancer Neg Hx    Uterine cancer Neg Hx    Colon cancer Neg Hx     Prior to Admission medications   Medication Sig Start Date End Date Taking? Authorizing Provider  Cholecalciferol  (VITAMIN D -3) 25 MCG (1000 UT) CAPS Take 1,000 Units by mouth  daily.    [provider]  clopidogrel  (PLAVIX ) 75 MG tablet Take 1 tablet (75 mg total) by mouth daily. 10/30/23   Kuneff, Renee A, DO  dorzolamide -timolol  (COSOPT ) 2-0.5 % ophthalmic solution Place 1 drop into both eyes 2 (two) times daily. 07/25/23   Kuneff, Renee A, DO  hydrALAZINE  (APRESOLINE ) 25 MG tablet Take 1 tablet (25 mg total) by mouth every 8 (eight) hours. 10/30/23   Kuneff, Renee A, DO  lisinopril  (ZESTRIL ) 40 MG tablet Take 1 tablet (40 mg total) by mouth daily. 10/30/23   Kuneff, Renee A, DO  metoprolol  succinate (TOPROL -XL) 25 MG 24 hr tablet Take 1 tablet (25 mg total) by mouth daily. 10/30/23   Kuneff,  Renee A, DO  Multiple Vitamins-Minerals (ONE-A-[place] WOMENS 50 PLUS) TABS Take 1 tablet by mouth daily.    [provider]  Omega-3 300 MG CAPS Take 1 tablet by mouth daily.    [provider]  polyethylene glycol powder (GLYCOLAX /MIRALAX ) 17 GM/SCOOP powder Take 17 g by mouth daily as needed for mild constipation.    [provider]  pravastatin  (PRAVACHOL ) 40 MG tablet Take 1 tablet (40 mg total) by mouth daily. 10/30/23   Kuneff, Renee A, DO  QUEtiapine  (SEROQUEL ) 25 MG tablet Take 2-3 tablets (50-75 mg total) by mouth at bedtime. 11/13/23   Kuneff, Renee A, DO  VYZULTA  0.024 % SOLN Apply 1 drop to eye at bedtime. 07/25/23   Catherine Charlies LABOR, DO    Physical Exam: Vitals:   12/11/23 1815 12/11/23 1830 12/11/23 2003 12/11/23 2021  BP: (!) 164/75 (!) 163/77 (!) 146/69 139/82  Pulse: 87 90 84 87  Resp: 17 14 16 16   Temp:    (!) 97.5 F (36.4 C)  TempSrc:    Oral  SpO2: 100% 100% 100% 100%  Weight:      Height:       Constitutional: Slightly confused, pleasant, no NAD, calm, comfortable Eyes: Left eye blindness ENMT: Mucous membranes are moist. Posterior pharynx clear of any exudate or lesions.Normal dentition.  Neck: normal, supple, no masses, no thyromegaly Respiratory: clear to auscultation bilaterally, no wheezing, no crackles. Normal respiratory effort. No accessory muscle use.  Cardiovascular: Regular rate and rhythm, no murmurs / rubs / gallops. No extremity edema. 2+ pedal pulses. No carotid bruits.  Abdomen: no tenderness, no masses palpated. No hepatosplenomegaly. Bowel sounds positive.  Musculoskeletal: Patient's left lower extremity slightly rotated, tender, post reduction no joint swelling or tenderness, Skin: no rashes, lesions, ulcers. No induration Neurologic: CN 2-12 grossly intact. Sensation intact, DTR normal. Strength 5/5 in all 4.  Psychiatric: Normal judgment and insight. Alert and oriented x 3. Normal mood  Data Reviewed:  Temperature 97.4,  blood pressure 190/74, pulse 95 respiratory rate 20 oxygen sat 93% on room air.  She is doing well.  White count 14.3, CO2 of 20, hemoglobin 11 glucose 150 urinalysis showed turbid urine, large leukocyte, negative nitrite.  WBC more than 50 and few bacteria.  CT cervical spine showed no acute injury head CT without contrast showed no significant findings CT cervical spine also showed moderate spondylosis throughout the cervical spine with C5 and C6 levels more pronounced.  X-ray of the left foot showed osteopenia but no fractures chest x-ray showed no acute findings.  X-ray of the pelvis showed superiorly dislocated left hip arthroplasty with osteopenia  Assessment and Plan:  #1 status post fall: Patient had possibly a mechanical fall.  Could have been syncopal episode.  She has had  residual gait disturbance after her stroke.  This could be the cause.  Patient has had reduction of her fracture.  Will admit the patient and continue with pain control.  PT and OT.  #2 type 2 diabetes: Sliding scale insulin .  #3 chronic kidney disease stage III: Continue to monitor renal function  #4 hyperlipidemia: Continue statin  #5 essential hypertension: Continue with blood pressure monitor.  #6 osteoporosis: Continue home regimen    Advance Care Planning:   Code Status: Full Code   Consults: None  Family Communication: No family at bedside  Severity of Illness: The appropriate patient status for this patient is OBSERVATION. Observation status is judged to be reasonable and necessary in order to provide the required intensity of service to ensure the patient's safety. The patient's presenting symptoms, physical exam findings, and initial radiographic and laboratory data in the context of their medical condition is felt to place them at decreased risk for further clinical deterioration. Furthermore, it is anticipated that the patient will be medically stable for discharge from the hospital within 2 midnights  of admission.   AuthorBETHA SIM KNOLL, MD 12/11/2023 9:30 PM  For on call review [email or web address].

## 2023-12-11 NOTE — ED Notes (Signed)
 UA unattainable due to dislocated hip at this time. MD notified and aware.

## 2023-12-11 NOTE — ED Triage Notes (Signed)
 Patient arrives via [facility] for fall from home. Patient had unwitnessed fall around 0900 this am. Patient found by son, cannot recall event. Obvious deformity to left hip. Patient alert and oriented x2 with recent mental decline per son. Hx of TIA-- takes plavix . C-collar placed for precaution. Patient endorses hitting head but no LOC, urinary incontinence. PNS intact in left foot.   182/98 -- hx but no meds today HR 84 98 on room air CBG 169

## 2023-12-11 NOTE — ED Notes (Signed)
 Patient transported to CT with RN

## 2023-12-11 NOTE — Telephone Encounter (Signed)
 FYI Only or Action Required?: Action required by provider: update on patient condition.  Patient was last seen in primary care on 11/13/2023 by Catherine Fuller A, DO.  Called Nurse Triage reporting Fall.  Symptoms began today.  Interventions attempted: Nothing.  Symptoms are: Unknown.  Triage Disposition: Call EMS 911 Now Son reports pt. Fell 2 hours ago and is still laying on the floor. Won't answer any other questions. States he will call 911. She needs to go to a nursing home.   Patient/caregiver understands and will follow disposition?: Yes    Copied from CRM [id number]. Topic: Clinical - Red Word Triage >> Dec 11, 2023 11:57 AM Henretta I wrote: Red Word that prompted transfer to Nurse Triage: Patient fell this morning, son is unable to pick her up and continue to care for her Reason for Disposition  Sounds like a life-threatening emergency to the triager  Answer Assessment - Initial Assessment Questions 1. MECHANISM: How did the fall happen?     Fell at home 2. DOMESTIC VIOLENCE AND ELDER ABUSE SCREENING: Did you fall because someone pushed you or tried to hurt you? If Yes, ask: Are you safe now?     no 3. ONSET: When did the fall happen? (e.g., minutes, hours, or days ago)     2 hours 4. LOCATION: What part of the body hit the ground? (e.g., back, buttocks, head, hips, knees, hands, head, stomach)     Son did not answer 5. INJURY: Did you hurt (injure) yourself when you fell? If Yes, ask: What did you injure? Tell me more about this? (e.g., body area; type of injury; pain severity)     unknown 6. PAIN: Is there any pain? If Yes, ask: How bad is the pain? (e.g., Scale 0-10; or none, mild,      unknown 7. SIZE: For cuts, bruises, or swelling, ask: How large is it? (e.g., inches or centimeters)      unknown 8. PREGNANCY: Is there any chance you are pregnant? When was your last menstrual period?     N/a 9. OTHER SYMPTOMS: Do you have any other  symptoms? (e.g., dizziness, fever, weakness; new-onset or worsening).      unknown 10. CAUSE: What do you think caused the fall (or falling)? (e.g., dizzy spell, tripped)       unknown  Protocols used: Falls and [facility]

## 2023-12-11 NOTE — ED Provider Notes (Addendum)
 [place] EMERGENCY DEPARTMENT AT [facility] Provider Note   CSN: [id number] Arrival date & time: 12/11/23  1344     Patient presents with: Madison Martinez is a 89 y.o. female.    Fall  Patient presents after fall reportedly found on the ground at about 9 this morning.  Patient reportedly not remember events.  Left hip deformity with pain.  Previous hip replacement.  Reportedly has some baseline confusion.  Did have some urinary incontinence once EMS arrived.  Is on Plavix .  Unsure if patient hit head.    Past Medical History:  Diagnosis Date   Blind hypertensive eye, left 04/18/2012   Changing skin lesion 11/23/2018   CVA (cere[facility] vascular accident) [facility])    Gait disturbance 10/05/2015   pt declined neuro referral per records   Generalized weakness 05/15/2019   Hard of hearing 12/02/2015   hearing aids   Hyperlipidemia    Hypertension    Insomnia    tried melatonin, advil PM, xanax and klonopin without success   Leukocytosis 11/07/2013   seen oncology, did not want to continue to follow there, so continued follow up at PCP.   Osteoporosis    Polyneuropathy    Primary open angle glaucoma 01/2011   Spinal stenosis    Stable branch retinal vein occlusion 01/06/2012   Type 2 diabetes mellitus (HCC)    diet controlled    Prior to Admission medications   Medication Sig Start Date End Date Taking? Authorizing Provider  Cholecalciferol  (VITAMIN D -3) 25 MCG (1000 UT) CAPS Take 1,000 Units by mouth daily.    [provider]  clopidogrel  (PLAVIX ) 75 MG tablet Take 1 tablet (75 mg total) by mouth daily. 10/30/23   Kuneff, Renee A, DO  dorzolamide -timolol  (COSOPT ) 2-0.5 % ophthalmic solution Place 1 drop into both eyes 2 (two) times daily. 07/25/23   Kuneff, Renee A, DO  hydrALAZINE  (APRESOLINE ) 25 MG tablet Take 1 tablet (25 mg total) by mouth every 8 (eight) hours. 10/30/23   Kuneff, Renee A, DO  lisinopril  (ZESTRIL ) 40 MG tablet Take 1 tablet (40 mg  total) by mouth daily. 10/30/23   Kuneff, Renee A, DO  metoprolol  succinate (TOPROL -XL) 25 MG 24 hr tablet Take 1 tablet (25 mg total) by mouth daily. 10/30/23   Kuneff, Renee A, DO  Multiple Vitamins-Minerals (ONE-A-[place] WOMENS 50 PLUS) TABS Take 1 tablet by mouth daily.    [provider]  Omega-3 300 MG CAPS Take 1 tablet by mouth daily.    [provider]  polyethylene glycol powder (GLYCOLAX /MIRALAX ) 17 GM/SCOOP powder Take 17 g by mouth daily as needed for mild constipation.    [provider]  pravastatin  (PRAVACHOL ) 40 MG tablet Take 1 tablet (40 mg total) by mouth daily. 10/30/23   Kuneff, Renee A, DO  QUEtiapine  (SEROQUEL ) 25 MG tablet Take 2-3 tablets (50-75 mg total) by mouth at bedtime. 11/13/23   Kuneff, Renee A, DO  VYZULTA  0.024 % SOLN Apply 1 drop to eye at bedtime. 07/25/23   Kuneff, Renee A, DO    Allergies: Amlodipine besylate and Influenza virus vaccine    Review of Systems  Updated Vital Signs BP (!) 190/74   Pulse 79   Temp 97.9 F (36.6 C) (Oral)   Resp 17   Ht 5' 4 (1.626 m)   LMP 03/21/1978 (Approximate)   SpO2 100%   BMI 22.31 kg/m   Physical Exam Vitals and nursing note reviewed.  HENT:  Head: Atraumatic.  Cardiovascular:     Rate and Rhythm: Normal rate.  Pulmonary:     Breath sounds: No wheezing.  Abdominal:     Tenderness: There is no abdominal tenderness.  Musculoskeletal:        General: Tenderness present.     Cervical back: Neck supple. No tenderness.     Comments: Left hip tenderness with some internal rotation at the hip.  Scar from previous hip replacement.  Does have some edema on left foot without much tenderness.  Initially denies tenderness but then later stated there was some.  Sensation intact in foot.  Neurological:     Mental Status: Madison Martinez is alert.     Comments: Awake and pleasant but some mild confusion.     (all labs ordered are listed, but only abnormal results are displayed) Labs Reviewed  CBC  WITH DIFFERENTIAL/PLATELET - Abnormal; Notable for the following components:      Result Value   WBC 14.3 (*)    Neutro Abs 9.1 (*)    Lymphs Abs 4.2 (*)    All other components within normal limits  I-STAT CHEM 8, ED - Abnormal; Notable for the following components:   Creatinine, Ser 1.30 (*)    Glucose, Bld 161 (*)    TCO2 20 (*)    All other components within normal limits  PROTIME-INR  BASIC METABOLIC PANEL WITH GFR  URINALYSIS, W/ REFLEX TO CULTURE (INFECTION SUSPECTED)  TYPE AND SCREEN    EKG: None  Radiology: DG Chest Portable 1 View Result Date: 12/11/2023 CLINICAL DATA:  fall EXAM: PORTABLE CHEST - 1 VIEW COMPARISON:  None available. FINDINGS: No focal airspace consolidation, pleural effusion, or pneumothorax. No cardiomegaly. Tortuous aorta with aortic atherosclerosis. Linear lucency along the medial left humeral head. Multilevel thoracic osteophytosis. IMPRESSION: 1. No acute cardiopulmonary abnormality. 2. Linear lucency along the medial left humeral head, which may represent a nutrient foramen or soft tissue artifact. If there is concern for fracture, dedicated shoulder radiographs would be recommended. Electronically Signed   By: Rogelia Myers M.D.   On: 12/11/2023 14:48   DG Femur Portable Min 2 Views Left Result Date: 12/11/2023 CLINICAL DATA:  fall, left hip pain EXAM: LEFT FEMUR PORTABLE 2 VIEWS COMPARISON:  None Available. FINDINGS: No acute fracture. Redemonstrated dislocation of the left hip arthroplasty. There is no evidence of arthropathy or other focal bone abnormality. Soft tissue swelling over the left hip. Peripheral vascular atherosclerosis. IMPRESSION: Redemonstrated dislocation of the left hip arthroplasty. Otherwise, no acute fracture of the femur. Electronically Signed   By: Rogelia Myers M.D.   On: 12/11/2023 14:40   DG Foot Complete Left Result Date: 12/11/2023 CLINICAL DATA:  fall EXAM: LEFT FOOT - COMPLETE 3+ VIEW COMPARISON:  None Available.  FINDINGS: No acute fracture or dislocation. There is no evidence of arthropathy or other focal bone abnormality. Peripheral vascular atherosclerosis. No radiopaque foreign body. IMPRESSION: Osteopenia.  No acute fracture or dislocation. Electronically Signed   By: Rogelia Myers M.D.   On: 12/11/2023 14:37   DG Pelvis Portable Result Date: 12/11/2023 CLINICAL DATA:  fall, left hip pain EXAM: PORTABLE PELVIS 1-2 VIEWS COMPARISON:  October 17, 2017 FINDINGS: Osteopenia. Right hip arthroplasty is anatomically aligned without dislocation. The left hip arthroplasty is superiorly dislocated. The acetabular cups appears similarly well-positioned. No evidence of pelvic fracture or diastasis.No acute hip fracture.Multilevel degenerative disc disease of the spine. Soft tissues are unremarkable. IMPRESSION: 1. Superiorly dislocated left hip arthroplasty. 2. Osteopenia.  Otherwise, no  acute fracture visualized. Electronically Signed   By: Rogelia Myers M.D.   On: 12/11/2023 14:36     Procedures   Medications Ordered in the ED  fentaNYL  (SUBLIMAZE ) injection 50 mcg (has no administration in time range)  0.9 %  sodium chloride  infusion ( Intravenous New Bag/Given 12/11/23 1414)    Clinical Course as of 12/11/23 1525  Mon Dec 11, 2023  1513 S. GLF yesterday. Dislocated L hip prosthesis. (Preformed in [place]). Ortho PA discussed case- ED attempt.  [ ]  CT head  [ ]  CT cervical spine [WB]    Clinical Course User Index [WB] Beam, Elsie, MD                                 Medical Decision Making Amount and/or Complexity of Data Reviewed Labs: ordered. Radiology: ordered.  Risk Prescription drug management.   Patient with fall.  On Plavix .  Differential diagnose includes various injuries include intracranial hemorrhage cervical spine injury.  Chest x-ray reassuring on my interpretation but does show prosthetic left hip dislocation.  Will get CT head and cervical spine.  Discussed with Ozell Purchase.   Should be able to follow-up with Dr.Xu.  Care turned over to oncoming provider.  No tenderness on proximal humerus.     Final diagnoses:  Dislocation of hip joint prosthesis, initial encounter [facility])    ED Discharge Orders     None          Patsey Lot, MD 12/11/23 1518    Patsey Lot, MD 12/11/23 1525

## 2023-12-11 NOTE — ED Provider Notes (Signed)
 Patient presented to the ED for evaluation after being found on the ground this morning.  Unclear if patient had a near syncopal episode where she stumbled and fell.  Patient was initially seen by Dr. Patsey.  Please see his notes.  Patient was found to have a dislocated hip prosthesis.  Patient underwent sedation and reduction of her hip dislocation successfully in the ED.  With the uncertainty as to the etiology of her fall.  Will admit for overnight observation   Case discussed with Dr Sim .Sedation  Date/Time: 12/11/2023 6:37 PM  Performed by: Randol Simmonds, MD Authorized by: Randol Simmonds, MD   Consent:    Consent obtained:  Verbal   Consent given by:  Patient   Risks discussed:  Allergic reaction, dysrhythmia, inadequate sedation, nausea, prolonged hypoxia resulting in organ damage, prolonged sedation necessitating reversal, respiratory compromise necessitating ventilatory assistance and intubation and vomiting   Alternatives discussed:  Analgesia without sedation, anxiolysis and regional anesthesia Universal protocol:    Procedure explained and questions answered to patient or proxy's satisfaction: yes     Relevant documents present and verified: yes     Test results available: yes     Imaging studies available: yes     Required blood products, implants, devices, and special equipment available: yes     Site/side marked: yes     Immediately prior to procedure, a time out was called: yes     Patient identity confirmed:  Verbally with patient Indications:    Procedure necessitating sedation performed by:  Physician performing sedation Pre-sedation assessment:    Time since last food or drink:  6   ASA classification: class 1 - normal, healthy patient     Mouth opening:  3 or more finger widths   Thyromental distance:  4 finger widths   Mallampati score:  I - soft palate, uvula, fauces, pillars visible   Neck mobility: normal     Pre-sedation assessments completed and reviewed:  airway patency, cardiovascular function, hydration status, mental status, nausea/vomiting, pain level, respiratory function and temperature   A pre-sedation assessment was completed prior to the start of the procedure Immediate pre-procedure details:    Reassessment: Patient reassessed immediately prior to procedure     Reviewed: vital signs, relevant labs/tests and NPO status     Verified: bag valve mask available, emergency equipment available, intubation equipment available, IV patency confirmed, oxygen available and suction available   Procedure details (see MAR for exact dosages):    Preoxygenation:  Nasal cannula   Sedation:  Propofol    Intended level of sedation: deep   Intra-procedure monitoring:  Blood pressure monitoring, cardiac monitor, continuous pulse oximetry, frequent LOC assessments, frequent vital sign checks and continuous capnometry   Intra-procedure events: none     Total Provider sedation time (minutes):  20 Post-procedure details:   A post-sedation assessment was completed following the completion of the procedure.   Attendance: Constant attendance by certified staff until patient recovered     Recovery: Patient returned to pre-procedure baseline     Post-sedation assessments completed and reviewed: airway patency, cardiovascular function, hydration status, mental status, nausea/vomiting, pain level, respiratory function and temperature     Patient is stable for discharge or admission: yes     Procedure completion:  Tolerated well, no immediate complications     Randol Simmonds, MD 12/11/23 [id number]

## 2023-12-11 NOTE — ED Notes (Signed)
 Awaiting pharm verification for propofol 

## 2023-12-11 NOTE — ED Notes (Signed)
 XR at bedside

## 2023-12-11 NOTE — ED Notes (Signed)
 CT called by RN [place], per CT tech all scanners full at this time. CT to call RN back

## 2023-12-11 NOTE — ED Notes (Signed)
CT called by RN

## 2023-12-12 DIAGNOSIS — W19XXXA Unspecified fall, initial encounter: Secondary | ICD-10-CM | POA: Diagnosis not present

## 2023-12-12 DIAGNOSIS — N1832 Chronic kidney disease, stage 3b: Secondary | ICD-10-CM | POA: Diagnosis not present

## 2023-12-12 LAB — COMPREHENSIVE METABOLIC PANEL WITH GFR
ALT: 13 U/L (ref 0–44)
AST: 32 U/L (ref 15–41)
Albumin: 2.7 g/dL — ABNORMAL LOW (ref 3.5–5.0)
Alkaline Phosphatase: 83 U/L (ref 38–126)
Anion gap: 11 (ref 5–15)
BUN: 21 mg/dL (ref 8–23)
CO2: 19 mmol/L — ABNORMAL LOW (ref 22–32)
Calcium: 8.7 mg/dL — ABNORMAL LOW (ref 8.9–10.3)
Chloride: 107 mmol/L (ref 98–111)
Creatinine, Ser: 1.24 mg/dL — ABNORMAL HIGH (ref 0.44–1.00)
GFR, Estimated: 40 mL/min — ABNORMAL LOW (ref >60)
Glucose, Bld: 107 mg/dL — ABNORMAL HIGH (ref 70–99)
Potassium: 3.9 mmol/L (ref 3.5–5.1)
Sodium: 137 mmol/L (ref 135–145)
Total Bilirubin: 0.8 mg/dL (ref 0.0–1.2)
Total Protein: 5.8 g/dL — ABNORMAL LOW (ref 6.5–8.1)

## 2023-12-12 LAB — CBC
HCT: 32.3 % — ABNORMAL LOW (ref 36.0–46.0)
Hemoglobin: 10.4 g/dL — ABNORMAL LOW (ref 12.0–15.0)
MCH: 30.7 pg (ref 26.0–34.0)
MCHC: 32.2 g/dL (ref 30.0–36.0)
MCV: 95.3 fL (ref 80.0–100.0)
Platelets: 224 K/uL (ref 150–400)
RBC: 3.39 MIL/uL — ABNORMAL LOW (ref 3.87–5.11)
RDW: 12.4 % (ref 11.5–15.5)
WBC: 11.4 K/uL — ABNORMAL HIGH (ref 4.0–10.5)
nRBC: 0 % (ref 0.0–0.2)

## 2023-12-12 NOTE — Progress Notes (Signed)
  Progress Note   Patient: Madison Martinez FMW:[id number] DOB: 26-Sep-1927 DOA: 12/11/2023     1 DOS: the patient was seen and examined on 12/12/2023 at 11:08AM      Brief hospital course: 89 y.o. F with dementia, DM, HTN, HLD, CKD IIIb basleine 1.2, blindness in left eye, hx CVA who presented with fall at home, obvious deformity left hip, found to have dislocated hip.  Hip reduced in the ER.  Ortho recommend nonoperative mgmt, knee immobilizer, WBAT and outpatient follow up.     Assessment and Plan: Left hip dislocation Due to mechanical fall.  Reduced in the ER.  Orthopedics were consulted by phone, recommend outpatient management. - WBAT -Maintain knee immobilizer to limit hip range of motion - Posterior precautions - Analgesics as needed -PT/OT   Normocytic anemia May be thigh hematoma, may be dilution. - Trend Hgb  Dementia Mentation seems at baseline - Continue quetiapine   Diabetes Glucose normal.  Not on medication at home. - Okay to defer sliding scale insulin   Chronic kidney disease stage IIIb Creatinine stable relative to baseline  Hypertension Cerebrovascular disease Hyperlipidemia Blood pressure controlled - Continue Plavix , hydralazine , lisinopril , metoprolol , pravastatin       Subjective: Patient is eating breakfast, she has no concerns.     Physical Exam: BP 129/72 (BP Location: Left Arm)   Pulse 72   Temp 97.6 F (36.4 C) (Oral)   Resp 16   Ht 5' 4 (1.626 m)   Wt 60 kg   LMP 03/21/1978 (Approximate)   SpO2 100%   BMI 22.71 kg/m   Adult female, lying in bed, interactive and appropriate RRR, no murmurs, no peripheral edema Respiratory normal, lungs clear without rales or wheezes Abdomen soft no tenderness palpation or guarding, no ascites or distention She is feeding herself breakfast, she has cloudy left cornea, and appears blind, also hard of hearing, oriented to self only, upper extremity strength normal Left leg in knee  immobilizer     Data Reviewed: Basic metabolic panel unremarkable CBC shows resolving leukocytosis, mild anemia     Family Communication:     Disposition: Status is: Observation 89 yo F with hip dislocation At baseline the patient is independent for transfers, ambulates at home.  Here she requires knee immobilizer, requires assistance with transfers, will require skilled She is currently medically ready for discharge.nursing rehabilitation and return to her prior level of function.         Author: Lonni SHAUNNA Dalton, MD 12/12/2023 6:34 PM  For on call review [email or web address].

## 2023-12-12 NOTE — Evaluation (Signed)
 Occupational Therapy Evaluation Patient Details Name: Madison Martinez MRN: [id number] DOB: [birth year] Today's Date: 12/12/2023   History of Present Illness   89 y.o. female presents to [facility] hospital on 12/11/2023 after a fall. Imaging notable for dislocation of L hip prosthesis, reduced in the ED. PMH includes DM, HTN, HLD, L eye blindness, CVA with residual gait abnormality, osteoporosis, polyneuropathy, spinal stenosis.     Clinical Impressions Per chart, pt from home with son; on eval, pt is a questionable historian, but reports that she typically mobilizes with a rollator and her son is available to assist her at least intermittently. Upon eval, pt with decreased knowledge of precautions, strength, balance, and L LE pain. Pt needing up to mod A +2 for basic transfers this session and max-total A for LB ADL. Patient will benefit from continued inpatient follow up therapy, <3 hours/[place]. Given current cognitive status, pt would benefit from transition back to home setting; pending progression and family ability to provide needed assist, d/c recommendation may be updated to [facility].      If plan is discharge home, recommend the following:   Two people to help with walking and/or transfers;Two people to help with bathing/dressing/bathroom;Assist for transportation;Help with stairs or ramp for entrance;Assistance with cooking/housework;Direct supervision/assist for medications management;Direct supervision/assist for financial management     Functional Status Assessment   Patient has had a recent decline in their functional status and demonstrates the ability to make significant improvements in function in a reasonable and predictable amount of time.     Equipment Recommendations   Other (comment) (defer)     Recommendations for Other Services         Precautions/Restrictions   Precautions Precautions: Other (comment) Precaution/Restrictions Comments: no formal orders in chart, reached  out to Dr Sim and Dr Jonel for clarification who state WBAT in [place] following posterior hip precautions Required Braces or Orthoses: Knee Immobilizer - Left Restrictions Weight Bearing Restrictions Per Provider Order: No Other Position/Activity Restrictions: LLE WBAT     Mobility Bed Mobility Overal bed mobility: Needs Assistance Bed Mobility: Supine to Sit, Sit to Supine     Supine to sit: Mod assist Sit to supine: Min assist   General bed mobility comments: assist for LE management and scooting toward EOB. Pt needing up to min A to bring LEs back into bed. therapists provided repositioning to scoot up in bed and move to center of bed    Transfers Overall transfer level: Needs assistance Equipment used: Rolling walker (2 wheels) Transfers: Sit to/from Stand Sit to Stand: Mod assist, +2 physical assistance, +2 safety/equipment, From elevated surface           General transfer comment: lifting assist; able to maintain standing for a few ~30 seconds with as little as min A before needing to sit      Balance Overall balance assessment: Needs assistance Sitting-balance support: No upper extremity supported, Feet supported Sitting balance-Leahy Scale: Good     Standing balance support: Bilateral upper extremity supported, Reliant on assistive device for balance Standing balance-Leahy Scale: Poor                             ADL either performed or assessed with clinical judgement   ADL Overall ADL's : Needs assistance/impaired Eating/Feeding: Set up;Sitting   Grooming: Set up;Sitting   Upper Body Bathing: Sitting;Set up   Lower Body Bathing: Maximal assistance;+2 for physical assistance;+2 for safety/equipment;Sit to/from stand  Upper Body Dressing : Set up;Sitting   Lower Body Dressing: Maximal assistance;+2 for physical assistance;+2 for safety/equipment;Sit to/from stand   Toilet Transfer: Moderate assistance;+2 for physical assistance;+2 for  safety/equipment;Rolling walker (2 wheels) Toilet Transfer Details (indicate cue type and reason): STS only this session         Functional mobility during ADLs: Moderate assistance       Vision Patient Visual Report: No change from baseline       Perception         Praxis         Pertinent Vitals/Pain Pain Assessment Pain Assessment: Faces Faces Pain Scale: Hurts little more Pain Location: L thigh and knee Pain Descriptors / Indicators: Grimacing, Guarding Pain Intervention(s): Limited activity within patient's tolerance, Monitored during session     Extremity/Trunk Assessment Upper Extremity Assessment Upper Extremity Assessment: Right hand dominant;RUE deficits/detail;LUE deficits/detail;Generalized weakness RUE Deficits / Details: decr shoulder ROM 0-~110 LUE Deficits / Details: decr shoulder ROM 0-~70   Lower Extremity Assessment Lower Extremity Assessment: Defer to PT evaluation   Cervical / Trunk Assessment Cervical / Trunk Assessment: Kyphotic   Communication Communication Communication: Impaired Factors Affecting Communication: Hearing impaired   Cognition Arousal: Alert Behavior During Therapy: WFL for tasks assessed/performed Cognition: History of cognitive impairments             OT - Cognition Comments: pleasantly confused; conversational, follows commands                 Following commands: Impaired Following commands impaired: Follows one step commands with increased time (occasional repeated commands due to pt hard of hearing)     Cueing  General Comments   Cueing Techniques: Verbal cues      Exercises     Shoulder Instructions      Home Living                                   Additional Comments: per chart, from home; pt reports lives with son, but questionable historian      Prior Functioning/Environment Prior Level of Function : Patient poor historian/Family not available                     OT Problem List: Decreased strength;Decreased activity tolerance;Impaired balance (sitting and/or standing);Decreased knowledge of use of DME or AE;Decreased knowledge of precautions;Pain   OT Treatment/Interventions: Self-care/ADL training;Therapeutic exercise;DME and/or AE instruction;Therapeutic activities;Patient/family education;Balance training      OT Goals(Current goals can be found in the care plan section)   Acute Rehab OT Goals Patient Stated Goal: get back on her feet OT Goal Formulation: With patient Time For Goal Achievement: 12/26/23 Potential to Achieve Goals: Good   OT Frequency:  Min 2X/week    Co-evaluation PT/OT/SLP Co-Evaluation/Treatment: Yes Reason for Co-Treatment: Complexity of the patient's impairments (multi-system involvement);For patient/therapist safety;To address functional/ADL transfers PT goals addressed during session: Mobility/safety with mobility;Proper use of DME OT goals addressed during session: ADL's and self-care      AM-PAC OT 6 Clicks Daily Activity     Outcome Measure Help from another person eating meals?: A Little Help from another person taking care of personal grooming?: A Little Help from another person toileting, which includes using toliet, bedpan, or urinal?: Total Help from another person bathing (including washing, rinsing, drying)?: A Lot Help from another person to put on and taking off regular upper body clothing?: A Little Help  from another person to put on and taking off regular lower body clothing?: Total 6 Click Score: 13   End of Session Equipment Utilized During Treatment: Gait belt;Rolling walker (2 wheels);Left knee immobilizer Nurse Communication: Mobility status  Activity Tolerance: Patient tolerated treatment well Patient left: in bed;with call bell/phone within reach;with bed alarm set  OT Visit Diagnosis: Unsteadiness on feet (R26.81);Muscle weakness (generalized) (M62.81);Pain Pain - Right/Left:  Left Pain - part of body: Knee;Hip                Time: 8585-8557 OT Time Calculation (min): 28 min Charges:  OT General Charges $OT Visit: 1 Visit OT Evaluation $OT Eval Moderate Complexity: 1 Mod  Elma JONETTA Lebron FREDERICK, OTR/L [facility] Acute Rehabilitation Office: [phone]   Elma JONETTA Lebron 12/12/2023, 3:51 PM

## 2023-12-12 NOTE — Evaluation (Signed)
 Physical Therapy Evaluation Patient Details Name: Madison Martinez MRN: [id number] DOB: [birth year] Today's Date: 12/12/2023  History of Present Illness  89 y.o. female presents to [facility] hospital on 12/11/2023 after a fall. Imaging notable for dislocation of L hip prosthesis, reduced in the ED. PMH includes DM, HTN, HLD, L eye blindness, CVA with residual gait abnormality, osteoporosis, polyneuropathy, spinal stenosis, dementia  Clinical Impression  Pt admitted with above diagnosis. Pt from home with son who was not present on eval. Pt unable to answer PLOF questions or home set up and does not remember falling. Pt educated in posterior hip precautions to prevent further dislocation as well as WBAT and use of KI. However, pt will need reinforcement due to STM deficits. Pt currently needs 2 person assist to mobilize and was able to come to standing with RW but unable to ambulate. Patient will benefit from continued inpatient follow up therapy, <3 hours/[place]. However, given current cognitive status, pt would likely do better at home if family able to provide mod to max A and be with her 24/7.  Pt currently with functional limitations due to the deficits listed below (see PT Problem List). Pt will benefit from acute skilled PT to increase their independence and safety with mobility to allow discharge.           If plan is discharge home, recommend the following: A lot of help with walking and/or transfers;A lot of help with bathing/dressing/bathroom;Assistance with cooking/housework;Assist for transportation;Help with stairs or ramp for entrance;Supervision due to cognitive status   Can travel by private vehicle   No    Equipment Recommendations None recommended by PT  Recommendations for Other Services       Functional Status Assessment Patient has had a recent decline in their functional status and demonstrates the ability to make significant improvements in function in a reasonable and predictable amount  of time.     Precautions / Restrictions Precautions Precautions: Fall;Posterior Hip Precaution Booklet Issued: Yes (comment) Recall of Precautions/Restrictions: Impaired Precaution/Restrictions Comments: no formal orders in chart, reached out to Dr Sim and Dr Jonel for clarification who state WBAT in [place] Martinez. PT advised pt to follow posterior hip precautions given dislocation Required Braces or Orthoses: Knee Immobilizer - Left Knee Immobilizer - Left: On at all times Restrictions Weight Bearing Restrictions Per Provider Order: No LLE Weight Bearing Per Provider Order: Weight bearing as tolerated      Mobility  Bed Mobility Overal bed mobility: Needs Assistance Bed Mobility: Supine to Sit, Sit to Supine     Supine to sit: Mod assist Sit to supine: Min assist   General bed mobility comments: assist for LE management and scooting toward EOB. Pt needing up to min A to bring LEs back into bed. therapists provided repositioning to scoot up in bed and move to center of bed    Transfers Overall transfer level: Needs assistance Equipment used: Rolling walker (2 wheels) Transfers: Sit to/from Stand Sit to Stand: Mod assist, +2 physical assistance, +2 safety/equipment, From elevated surface           General transfer comment: mod A +2 for power up.  Able to maintain standing for ~30 seconds with as little as min A before needing to sit    Ambulation/Gait               General Gait Details: was able to reposition each foot in standing, even taking wt on LLE. However, was unable to step fwd to ambulate  Stairs  Wheelchair Mobility     Tilt Bed    Modified Rankin (Stroke Patients Only)       Balance Overall balance assessment: Needs assistance Sitting-balance support: No upper extremity supported, Feet supported Sitting balance-Leahy Scale: Fair     Standing balance support: Bilateral upper extremity supported, Reliant on assistive device for  balance Standing balance-Leahy Scale: Poor                               Pertinent Vitals/Pain Pain Assessment Pain Assessment: Faces Faces Pain Scale: Hurts little more Pain Location: L thigh and knee Pain Descriptors / Indicators: Grimacing, Guarding Pain Intervention(s): Limited activity within patient's tolerance, Monitored during session    Home Living Family/patient expects to be discharged to:: Private residence Living Arrangements: Children Available Help at Discharge: Family;Available 24 hours/[place] Type of Home: House Home Access: Stairs to enter   [employer] of Steps: 5   Home Layout: Able to live on main level with bedroom/bathroom Home Equipment: Agricultural consultant (2 wheels);Shower seat;Rollator (4 wheels) Additional Comments: info above is from last admission, pt poor historian and cannot give home set up or PLOF.    Prior Function Prior Level of Function : Patient poor historian/Family not available             Mobility Comments: pt reports she ambulated with rollator ADLs Comments: unsure     Extremity/Trunk Assessment   Upper Extremity Assessment Upper Extremity Assessment: Defer to OT evaluation RUE Deficits / Details: decr shoulder ROM 0-~110 LUE Deficits / Details: decr shoulder ROM 0-~70    Lower Extremity Assessment Lower Extremity Assessment: Generalized weakness;LLE deficits/detail LLE Deficits / Details: hip pain with active and passive motion. Pt unable to lift LE against gravity but is able to abduct along bed. Pt with bilateral feet in inversion with decreased strength of everters bilaterally LLE: Unable to fully assess due to immobilization;Unable to fully assess due to pain LLE Sensation: WNL LLE Coordination: decreased gross motor    Cervical / Trunk Assessment Cervical / Trunk Assessment: Kyphotic  Communication   Communication Communication: Impaired Factors Affecting Communication: Hearing impaired     Cognition Arousal: Alert Behavior During Therapy: WFL for tasks assessed/performed   PT - Cognitive impairments: Orientation, Awareness, Memory, Attention, Initiation, Sequencing, Problem solving, Safety/Judgement   Orientation impairments: Place, Time, Situation                   PT - Cognition Comments: pt does not remember falling. She thinks her foot is hurt. Following commands: Impaired Following commands impaired: Follows one step commands with increased time (occasional repeated commands due to pt hard of hearing)     Cueing Cueing Techniques: Verbal cues     General Comments General comments (skin integrity, edema, etc.): post hip precautions reviewed with pt but she cannot remember, sign posted in room    Exercises General Exercises - Lower Extremity Ankle Circles/Pumps: AROM, Both, 20 reps, Supine   Assessment/Plan    PT Assessment Patient needs continued PT services  PT Problem List Decreased strength;Decreased range of motion;Decreased activity tolerance;Decreased balance;Decreased mobility;Decreased coordination;Decreased cognition;Decreased knowledge of use of DME;Decreased safety awareness;Decreased knowledge of precautions;Pain       PT Treatment Interventions DME instruction;Gait training;Functional mobility training;Therapeutic activities;Therapeutic exercise;Balance training;Neuromuscular re-education;Cognitive remediation;Patient/family education    PT Goals (Current goals can be found in the Care Plan section)  Acute Rehab PT Goals Patient Stated Goal: walk PT Goal Formulation:  Patient unable to participate in goal setting Time For Goal Achievement: 12/26/23 Potential to Achieve Goals: Fair    Frequency Min 2X/week     Co-evaluation PT/OT/SLP Co-Evaluation/Treatment: Yes Reason for Co-Treatment: Complexity of the patient's impairments (multi-system involvement);For patient/therapist safety;To address functional/ADL transfers PT goals  addressed during session: Mobility/safety with mobility;Proper use of DME OT goals addressed during session: ADL's and self-care       AM-PAC PT 6 Clicks Mobility  Outcome Measure Help needed turning from your back to your side while in a flat bed without using bedrails?: A Lot Help needed moving from lying on your back to sitting on the side of a flat bed without using bedrails?: A Lot Help needed moving to and from a bed to a chair (including a wheelchair)?: A Lot Help needed standing up from a chair using your arms (e.g., wheelchair or bedside chair)?: Total Help needed to walk in hospital room?: Total Help needed climbing 3-5 steps with a railing? : Total 6 Click Score: 9    End of Session Equipment Utilized During Treatment: Gait belt Activity Tolerance: Patient tolerated treatment well Patient left: in bed;with call bell/phone within reach;with bed alarm set Nurse Communication: Mobility status PT Visit Diagnosis: Unsteadiness on feet (R26.81);History of falling (Z91.81);Muscle weakness (generalized) (M62.81);Difficulty in walking, not elsewhere classified (R26.2);Pain Pain - Right/Left: Left Pain - part of body: Hip    Time: 1414-1450 PT Time Calculation (min) (ACUTE ONLY): 36 min   Charges:   PT Evaluation $PT Eval Moderate Complexity: 1 Mod   PT General Charges $$ ACUTE PT VISIT: 1 Visit         Richerd Lipoma, PT  Acute Rehab Services Secure chat preferred Office [phone]   Richerd CROME Jada Kuhnert 12/12/2023, 4:36 PM

## 2023-12-12 NOTE — Care Management CC44 (Signed)
 Condition Code 44 Documentation Completed  Patient Details  Name: Madison Martinez MRN: [id number] Date of Birth: 1927-10-24   Condition Code 44 given:  Yes Patient signature on Condition Code 44 notice:  no Documentation of 2 MD's agreement:  Yes Code 44 added to claim:  Yes    Rosalva Jon Bloch, RN 12/12/2023, 1:48 PM

## 2023-12-12 NOTE — TOC Initial Note (Signed)
 Transition of Care [facility]) - Initial/Assessment Note    Patient Details  Name: Madison Martinez MRN: [id number] Date of Birth: November 24, 1927  Transition of Care [facility]) CM/SW Contact:    Rosalva Jon Bloch, RN Phone Number: 12/12/2023, 1:56 PM  Clinical Narrative:                 Pt presents after a fall @ home. Pt suffered dislocation of left hip prosthesis. Reduction to hip performed in ED. Pr resides with son, Max. Prior to fall per son pt was oriented and independent with ADL's. Used cane  or walker with ambulation. Son states currently  he can't manage pt .Thinks pt can benefit from SNF/REHAB.  PT/OT evaluations pending....  IP care management following and will assist with transitional care needs.  Expected Discharge Plan: Home w Home Health Services (vs SNF/REHAB) Barriers to Discharge: Continued Medical Work up   Patient Goals and CMS Choice            Expected Discharge Plan and Services   Discharge Planning Services: CM Consult                                          Prior Living Arrangements/Services     Patient language and need for interpreter reviewed:: No Do you feel safe going back to the place where you live?: Yes      Need for Family Participation in Patient Care: Yes (Comment) Care giver support system in place?: Yes (comment)   Criminal Activity/Legal Involvement Pertinent to Current Situation/Hospitalization: No - Comment as needed  Activities of Daily Living   ADL Screening (condition at time of admission) Independently performs ADLs?: No Does the patient have a NEW difficulty with bathing/dressing/toileting/self-feeding that is expected to last >3 days?: No Does the patient have a NEW difficulty with getting in/out of bed, walking, or climbing stairs that is expected to last >3 days?: No Does the patient have a NEW difficulty with communication that is expected to last >3 days?: No Is the patient deaf or have difficulty hearing?: Yes Does  the patient have difficulty seeing, even when wearing glasses/contacts?: Yes Does the patient have difficulty concentrating, remembering, or making decisions?: Yes  Permission Sought/Granted                  Emotional Assessment       Orientation: : Oriented to Self Alcohol / Substance Use: Not Applicable Psych Involvement: No (comment)  Admission diagnosis:  Fall [W19.XXXA] Fall, initial encounter [id number]] Dislocation of hip joint prosthesis, initial encounter [U15.970J, Z96.649] Patient Active Problem List   Diagnosis Date Noted   Fall 12/11/2023   History of excision of lamina of lumbar vertebra for decompression of spinal cord 09/20/2019   Thickened endometrium 08/06/2019   Abnormal appearance of cervix 08/06/2019   Fluid in endometrial cavity 07/04/2019   Aortic atherosclerosis 05/23/2019   Calculus of gallbladder without cholecystitis without obstruction 05/23/2019   Sigmoid diverticulitis 05/23/2019   Ovarian mass, right 05/23/2019   Chronic anticoagulation 05/23/2019   Aneurysm of internal carotid artery 05/23/2019   Abnormal EEG 05/23/2019   Small vessel disease 05/23/2019   Advanced cerebral atrophy (HCC) 05/23/2019   Aortic valve calcification 05/23/2019   CVA (cerebral vascular accident) (HCC) 05/16/2019   Colitis 05/15/2019   Skin cancer 12/14/2018   Mild cognitive impairment 08/06/2018   Right lumbar radiculopathy 09/25/2017  At high risk for falls 09/25/2017   Use of cane as ambulatory aid 12/16/2016   Chronic kidney disease, stage 3 (HCC) 12/16/2016   Hyperlipidemia    Osteoporosis    Polyneuropathy    Spinal stenosis    Hard of hearing 12/02/2015   Monoclonal B-cell lymphocytosis 11/07/2013   Blind hypertensive eye 04/18/2012   Diabetes mellitus, type II (HCC) 03/06/2012   Essential hypertension 03/06/2012   Primary open angle glaucoma 01/06/2012   Pseudophakia 01/06/2012   Stable branch retinal vein occlusion 01/06/2012   PCP:  Catherine Charlies LABOR, DO Pharmacy:   [facility] [place], [place] - [address] [place] [address] Phone: [phone] Fax: [phone]  [facility] Delivery - [place], [place] - [address] [place] [address] Phone: [phone] Fax: [phone]     Social Drivers of Health (SDOH) Social History: SDOH Screenings   Food Insecurity: Patient Unable To Answer (12/12/2023)  Housing: Low Risk  (08/31/2022)  Transportation Needs: No Transportation Needs (12/12/2023)  Utilities: Not At Risk (08/31/2022)  Alcohol Screen: Low Risk  (08/12/2020)  Depression (PHQ2-9): Medium Risk (08/11/2023)  Financial Resource Strain: Low Risk  (08/31/2022)  Physical Activity: Insufficiently Active (08/31/2022)  Social Connections: Unknown (12/12/2023)  Stress: No Stress Concern Present (08/31/2022)  Tobacco Use: Low Risk  (11/13/2023)   SDOH Interventions:     Readmission Risk Interventions     No data to display

## 2023-12-12 NOTE — TOC CAGE-AID Note (Signed)
 Transition of Care [facility]) - CAGE-AID Screening   Patient Details  Name: Madison Martinez MRN: [id number] Date of Birth: February 28, 1928  Transition of Care [facility]) CM/SW Contact:    Hildegarde Dunaway E Korban Shearer, LCSW Phone Number: 12/12/2023, 8:54 AM   Clinical Narrative: Memory impairment.   CAGE-AID Screening: Substance Abuse Screening unable to be completed due to: : Patient unable to participate

## 2023-12-12 NOTE — Care Management Obs Status (Signed)
 MEDICARE OBSERVATION STATUS NOTIFICATION   Patient Details  Name: Madison Martinez MRN: [id number] Date of Birth: April 16, 1927   Medicare Observation Status Notification Given:  No (Notice mailed to son, Max.)    Rosalva Jon Bloch, RN 12/12/2023, 1:48 PM

## 2023-12-13 DIAGNOSIS — N1832 Chronic kidney disease, stage 3b: Secondary | ICD-10-CM | POA: Diagnosis not present

## 2023-12-13 DIAGNOSIS — W19XXXA Unspecified fall, initial encounter: Secondary | ICD-10-CM | POA: Diagnosis not present

## 2023-12-13 LAB — URINE CULTURE: Culture: >=100000 — AB

## 2023-12-13 MED ORDER — SODIUM CHLORIDE 0.9 % IV SOLN
1.0000 g | INTRAVENOUS | Status: DC
Start: 2023-12-13 — End: 2023-12-13
  Administered 2023-12-13: 1 g via INTRAVENOUS
  Filled 2023-12-13: qty 10

## 2023-12-13 MED ORDER — AMOXICILLIN 500 MG PO CAPS
500.0000 mg | ORAL_CAPSULE | Freq: Two times a day (BID) | ORAL | Status: DC
  Administered 2023-12-13 – 2023-12-14 (×2): 500 mg via ORAL
  Filled 2023-12-13 (×3): qty 1

## 2023-12-13 NOTE — Progress Notes (Signed)
 Physical Therapy Treatment Patient Details Name: Madison Martinez MRN: [id number] DOB: [birth year] Today's Date: 12/13/2023   History of Present Illness 89 y.o. female presents to [facility] hospital on 12/11/2023 after a fall. Imaging notable for dislocation of L hip prosthesis, reduced in the ED. MD recommending LLE WBAT with KI and Posterior Hip Precs after reduction. PMH includes DM, HTN, HLD, L eye blindness, CVA with residual gait abnormality, osteoporosis, polyneuropathy, spinal stenosis, hearing loss, dementia    PT Comments  Pt received in supine, agreeable to therapy session, pt not fully oriented to situation and c/o pain with AAROM, RN notified and arrived to give pain meds during session. Pt needing up to +2 maxA for sit<>stand from elevated bed<>Stedy and totalA to pivot to chair from bed via [employer]. Pt with poor recall of posterior hip precs, given additional handout and after multiple reviews, able to recall 1/3 precs by end of session. Pt would benefit from hip abduction pillow to prevent L hip IR and adduction when resting as she was in L hip IR/ankles partially crossed in supine when PTA arrived to her room initially. No family present, but given that pt needs totalA for pivot OOB at this time and +2 physical assist for all aspects of functional mobility, continue to recommend inpatient follow up therapy, <3 hours/[place] as pt remains below functional baseline, she reports typically she is ambulatory with RW at home.    If plan is discharge home, recommend the following: A lot of help with bathing/dressing/bathroom;Assistance with cooking/housework;Assist for transportation;Help with stairs or ramp for entrance;Supervision due to cognitive status;Two people to help with walking and/or transfers;Direct supervision/assist for medications management;Direct supervision/assist for financial management   Can travel by private vehicle     No  Equipment Recommendations  None recommended by PT;Other  (comment) (currently would need hoyer lift if she went home)    Recommendations for Other Services       Precautions / Restrictions Precautions Precautions: Fall;Posterior Hip Precaution Booklet Issued: Yes (comment) Recall of Precautions/Restrictions: Impaired Precaution/Restrictions Comments: no formal orders in chart, reached out to Dr Sim and Dr Jonel for clarification who state WBAT in [place]. Therapists advised pt to follow posterior hip precautions given dislocation as of 12/12/23. Required Braces or Orthoses: Knee Immobilizer - Left Knee Immobilizer - Left: On at all times Restrictions Weight Bearing Restrictions Per Provider Order: Yes LLE Weight Bearing Per Provider Order: Weight bearing as tolerated Other Position/Activity Restrictions: with KI donned     Mobility  Bed Mobility Overal bed mobility: Needs Assistance Bed Mobility: Supine to Sit     Supine to sit: Mod assist, +2 for physical assistance, HOB elevated, Used rails     General bed mobility comments: assist for LE management and scooting toward EOB, cues for use of bed rail to pull up with, trunk and lower extremity support with transfer to upright EOB.    Transfers Overall transfer level: Needs assistance Equipment used: Ambulation equipment used Transfers: Sit to/from Stand Sit to Stand: +2 physical assistance, +2 safety/equipment, From elevated surface, Max assist, Via lift equipment           General transfer comment: Attempted STS from EOB>stedy wtih modA +2 but pt unable to stand fully upright with Stedy rail support. Pt then given +2 maxA and able to stand upright fully, totalA to pivot to chiar from EOB in Gates seat, then +2 modA for STS from elevated Stedy flaps and to sit down in cushioned recliner seat. [employer] cushion  in recliner for pt comfort/pressure relief under her hips, chair alarm on for safety. Transfer via Lift Equipment: Stedy  Ambulation/Gait               General Gait  Details: was able to reposition each foot in standing, even taking wt on LLE. However, was unable to weight shift from one leg to the other or take steps in [place] platform, standing <20 seconds x2 trials   Stairs             Wheelchair Mobility     Tilt Bed    Modified Rankin (Stroke Patients Only)       Balance Overall balance assessment: Needs assistance Sitting-balance support: Feet supported, Single extremity supported Sitting balance-Leahy Scale: Fair     Standing balance support: Bilateral upper extremity supported, Reliant on assistive device for balance Standing balance-Leahy Scale: Zero Standing balance comment: +2 maxA, limited time in platform [employer] lift                            Communication Communication Communication: Impaired Factors Affecting Communication: Hearing impaired  Cognition Arousal: Alert Behavior During Therapy: WFL for tasks assessed/performed   PT - Cognitive impairments: Orientation, Awareness, Memory, Attention, Initiation, Sequencing, Problem solving, Safety/Judgement   Orientation impairments: Place, Time, Situation                   PT - Cognition Comments: Poor recall of situation and precs, pt given handout and reviewed posterior hips precs 3x during session, able to recall 1/3 precs by end of session. Extra handout left in her room on tray table and other copy taped to her wall for pt/staff carryover. Following commands: Impaired Following commands impaired: Follows one step commands with increased time (occasional repeated commands due to pt hard of hearing)    Cueing Cueing Techniques: Verbal cues, Gestural cues, Tactile cues  Exercises General Exercises - Lower Extremity Ankle Circles/Pumps: Both, Supine, AAROM, AROM, 10 reps (pt maintains BLE plantarflexion at baseline, limited bil ROM) Hip ABduction/ADduction: AAROM, Both, 5 reps, Supine (AA, pt tending to try to cross legs in supine initially despite  cues to keep hips/legs wider apart) Hip Flexion/Marching: AROM, Right, 10 reps, Seated, AAROM    General Comments General comments (skin integrity, edema, etc.): pt given extra handout on her posterior hip precs, reviewed also with RN x2 in room and pt recalling 1/3 by end of session. recommended to RN to try hip abduction pillow when resting as pt tending to L hip IR and some crossing of distal extremities/ankles observed when pt received at beginning of session.      Pertinent Vitals/Pain Pain Assessment Pain Assessment: PAINAD Faces Pain Scale: Hurts little more Breathing: occasional labored breathing, short period of hyperventilation Negative Vocalization: occasional moan/groan, low speech, negative/disapproving quality Facial Expression: facial grimacing Body Language: relaxed Consolability: distracted or reassured by voice/touch PAINAD Score: 5 Pain Location: L thigh and knee with repositioning and AAROM; also guarding on RLE with A/AAROM. Pain Descriptors / Indicators: Grimacing, Guarding, Sore, Discomfort Pain Intervention(s): Limited activity within patient's tolerance, Monitored during session, Repositioned, RN gave pain meds during session, Patient requesting pain meds-RN notified, Ice applied    Home Living                          Prior Function            PT Goals (current goals  can now be found in the care plan section) Acute Rehab PT Goals Patient Stated Goal: Less pain and to walk PT Goal Formulation: Patient unable to participate in goal setting Time For Goal Achievement: 12/26/23 Progress towards PT goals: Progressing toward goals    Frequency    Min 2X/week      PT Plan      Co-evaluation              AM-PAC PT 6 Clicks Mobility   Outcome Measure  Help needed turning from your back to your side while in a flat bed without using bedrails?: A Lot Help needed moving from lying on your back to sitting on the side of a flat bed  without using bedrails?: Total Help needed moving to and from a bed to a chair (including a wheelchair)?: Total (+2 assist today) Help needed standing up from a chair using your arms (e.g., wheelchair or bedside chair)?: Total Help needed to walk in hospital room?: Total Help needed climbing 3-5 steps with a railing? : Total 6 Click Score: 7    End of Session Equipment Utilized During Treatment: Gait belt;Left knee immobilizer Activity Tolerance: Patient tolerated treatment well;Patient limited by pain Patient left: with call bell/phone within reach;in chair;with chair alarm set (RN arriving to reapply new purewick, pt eating from lunch tray which PTA assisted her to set up.) Nurse Communication: Mobility status;Need for lift equipment;Precautions;Patient requests pain meds PT Visit Diagnosis: Unsteadiness on feet (R26.81);History of falling (Z91.81);Muscle weakness (generalized) (M62.81);Difficulty in walking, not elsewhere classified (R26.2);Pain Pain - Right/Left: Left Pain - part of body: Hip     Time: 8887-8852 PT Time Calculation (min) (ACUTE ONLY): 35 min  Charges:    $Therapeutic Exercise: 8-22 mins $Therapeutic Activity: 8-22 mins PT General Charges $$ ACUTE PT VISIT: 1 Visit                     Keandra Medero P., PTA Acute Rehabilitation Services Secure Chat Preferred 9a-5:30pm Office: [phone]    Connell HERO [facility] 12/13/2023, 2:15 PM

## 2023-12-13 NOTE — NC FL2 (Signed)
 [place]  MEDICAID FL2 LEVEL OF CARE FORM     IDENTIFICATION  Patient Name: Madison Martinez Birthdate: 11/29/1927 Sex: female Admission Date (Current Location): 12/11/2023  [facility] and [place] Number:  Producer, television/film/video and Address:  [facility], [address], [place], [place] [address]      Provider Number: [id number]  Attending Physician Name and Address:  Leotis Bogus, MD  Relative Name and Phone Number:  Joesph Royden Blades [phone]    Current Level of Care: Hospital Recommended Level of Care: Skilled Nursing Facility Prior Approval Number:    Date Approved/Denied:   PASRR Number: [id number]  Discharge Plan: SNF    Current Diagnoses: Patient Active Problem List   Diagnosis Date Noted   Fall 12/11/2023   History of excision of lamina of lumbar vertebra for decompression of spinal cord 09/20/2019   Thickened endometrium 08/06/2019   Abnormal appearance of cervix 08/06/2019   Fluid in endometrial cavity 07/04/2019   Aortic atherosclerosis 05/23/2019   Calculus of gallbladder without cholecystitis without obstruction 05/23/2019   Sigmoid diverticulitis 05/23/2019   Ovarian mass, right 05/23/2019   Chronic anticoagulation 05/23/2019   Aneurysm of internal carotid artery 05/23/2019   Abnormal EEG 05/23/2019   Small vessel disease 05/23/2019   Advanced cerebral atrophy (HCC) 05/23/2019   Aortic valve calcification 05/23/2019   CVA (cerebral vascular accident) (HCC) 05/16/2019   Colitis 05/15/2019   Skin cancer 12/14/2018   Mild cognitive impairment 08/06/2018   Right lumbar radiculopathy 09/25/2017   At high risk for falls 09/25/2017   Use of cane as ambulatory aid 12/16/2016   Chronic kidney disease, stage 3 (HCC) 12/16/2016   Hyperlipidemia    Osteoporosis    Polyneuropathy    Spinal stenosis    Hard of hearing 12/02/2015   Monoclonal B-cell lymphocytosis 11/07/2013   Blind hypertensive eye 04/18/2012   Diabetes mellitus, type  II (HCC) 03/06/2012   Essential hypertension 03/06/2012   Primary open angle glaucoma 01/06/2012   Pseudophakia 01/06/2012   Stable branch retinal vein occlusion 01/06/2012    Orientation RESPIRATION BLADDER Height & Weight     Self, Situation  Normal Incontinent Weight: 132 lb 4.4 oz (60 kg) Height:  5' 4 (162.6 cm)  BEHAVIORAL SYMPTOMS/MOOD NEUROLOGICAL BOWEL NUTRITION STATUS        Diet (see discharge summary)  AMBULATORY STATUS COMMUNICATION OF NEEDS Skin   Total Care Verbally Other (Comment) (redness)                       Personal Care Assistance Level of Assistance  Bathing, Feeding, Dressing Bathing Assistance: Maximum assistance Feeding assistance: Limited assistance Dressing Assistance: Maximum assistance     Functional Limitations Info             SPECIAL CARE FACTORS FREQUENCY  PT (By licensed PT), OT (By licensed OT)     PT Frequency: 5x week OT Frequency: 5x week            Contractures Contractures Info: Not present    Additional Factors Info  Code Status, Allergies Code Status Info: full Allergies Info: Bayer Aspirin  (Aspirin ), Influenza Virus Vaccine, Motrin (Ibuprofen), Norvasc (Amlodipine Besylate)           Current Medications (12/13/2023):  This is the current hospital active medication list Current Facility-Administered Medications  Medication Dose Route Frequency Provider Last Rate Last Admin   acetaminophen  (TYLENOL ) tablet 650 mg  650 mg Oral Q6H PRN Sim Emery CROME, MD  650 mg at 12/12/23 1047   Or   acetaminophen  (TYLENOL ) suppository 650 mg  650 mg Rectal Q6H PRN Sim Emery CROME, MD       cholecalciferol  (VITAMIN D3) 25 MCG (1000 UNIT) tablet 1,000 Units  1,000 Units Oral Daily Sim Emery CROME, MD   1,000 Units at 12/13/23 0842   clopidogrel  (PLAVIX ) tablet 75 mg  75 mg Oral Daily Sim Emery L, MD   75 mg at 12/13/23 9157   dorzolamide -timolol  (COSOPT ) 2-0.5 % ophthalmic solution 1 drop  1 drop Both Eyes BID  Garba, Mohammad L, MD   1 drop at 12/13/23 0846   enoxaparin  (LOVENOX ) injection 30 mg  30 mg Subcutaneous Q24H Sim Emery L, MD   30 mg at 12/12/23 2052   fentaNYL  (SUBLIMAZE ) injection 50 mcg  50 mcg Intravenous Q30 min PRN Patsey Lot, MD       hydrALAZINE  (APRESOLINE ) tablet 25 mg  25 mg Oral Q8H Garba, Mohammad L, MD   25 mg at 12/13/23 9394   ketorolac  (TORADOL ) 15 MG/ML injection 15 mg  15 mg Intravenous Q6H PRN Sim Emery CROME, MD       latanoprost  (XALATAN ) 0.005 % ophthalmic solution 1 drop  1 drop Both Eyes QHS Garba, Mohammad L, MD   1 drop at 12/12/23 2056   lisinopril  (ZESTRIL ) tablet 40 mg  40 mg Oral Daily Garba, Mohammad L, MD   40 mg at 12/13/23 9157   metoprolol  succinate (TOPROL -XL) 24 hr tablet 25 mg  25 mg Oral Daily Garba, Mohammad L, MD   25 mg at 12/13/23 9157   multivitamin with minerals tablet 1 tablet  1 tablet Oral Daily Garba, Mohammad L, MD   1 tablet at 12/13/23 0842   omega-3 acid ethyl esters (LOVAZA ) capsule 1 g  1 capsule Oral Daily Garba, Mohammad L, MD   1 g at 12/13/23 9157   ondansetron  (ZOFRAN ) tablet 4 mg  4 mg Oral Q6H PRN Sim Emery CROME, MD       Or   ondansetron  (ZOFRAN ) injection 4 mg  4 mg Intravenous Q6H PRN Garba, Mohammad L, MD       polyethylene glycol (MIRALAX  / GLYCOLAX ) packet 17 g  17 g Oral Daily PRN Sim Emery CROME, MD       pravastatin  (PRAVACHOL ) tablet 40 mg  40 mg Oral Daily Garba, Mohammad L, MD   40 mg at 12/13/23 9157   propofol  (DIPRIVAN ) 10 mg/mL bolus/IV push 30 mg  0.5 mg/kg Intravenous Once Beam, William, MD       QUEtiapine  (SEROQUEL ) tablet 50-75 mg  50-75 mg Oral QHS Garba, Mohammad L, MD   50 mg at 12/12/23 2052     Discharge Medications: Please see discharge summary for a list of discharge medications.  Relevant Imaging Results:  Relevant Lab Results:   Additional Information SSN: [id number]  Bridget Cordella Simmonds, LCSW

## 2023-12-13 NOTE — Progress Notes (Signed)
 PROGRESS NOTE    Madison Martinez  FMW:[id number] DOB: 20-Sep-1927 DOA: 12/11/2023 PCP: Catherine Charlies LABOR, DO   Brief Narrative:  This 89 yrs old Female with PMH significant for dementia, DM, HTN, HLD, CKD IIIb ( basleine creatinine 1.2), blindness in left eye, hx CVA. who presented s/p fall at home, obvious deformity left hip noted , found to have dislocated hip. She was admitted for further evaluation.  Orthopedics consulted. Hip reduced in the ER.  Ortho recommend nonoperative mgmt, knee immobilizer, WBAT and outpatient follow up. TOC looking for SNF placement.  Assessment & Plan:   Principal Problem:   Fall Active Problems:   Diabetes mellitus, type II (HCC)   Blind hypertensive eye   Essential hypertension   Hyperlipidemia   Polyneuropathy   Chronic kidney disease, stage 3 (HCC)   Left hip dislocation: Due to mechanical fall.  Left hip reduced in the ER.   Orthopedics were consulted by phone, recommend outpatient management. - Advised WBAT -Maintain knee immobilizer to limit hip range of motion - Posterior precautions. - Analgesics as needed - PT/OT > SNF   Normocytic anemia: May be thigh hematoma, may be dilution. H&H remains relatively stable above 10. No signs of any obvious visible bleeding.   Dementia: Mentation seems at baseline. - Continue quetiapine .   Diabetes Mellitus II: Glucose normal.  Not on medication at home. -Okay to defer sliding scale insulin    Chronic kidney disease stage IIIb: Creatinine stable, relative to baseline. Avoid nephrotoxic medications.   Hypertension: Cerebrovascular disease: Hyperlipidemia: Blood pressure controlled. - Continue Plavix , hydralazine , lisinopril , metoprolol , pravastatin .  Leukocytosis: Likely reactive,  also due to Enterococcus UTI. Started on ceftriaxone . Urine culture grew Enterococcus.    DVT prophylaxis: Lovenox  Code Status: Full code Family Communication: No family at bedside. Disposition Plan:     Status is: Observation The patient remains OBS appropriate and will d/c before 2 midnights.   Patient is currently medically ready for discharge, insurance authorization pending for nursing home placement.  Consultants:  Orthopedics  Procedures: Hip reduction. Antimicrobials: Anti-infectives (From admission, onward)    Start     Dose/Rate Route Frequency Ordered Stop   12/13/23 1215  cefTRIAXone  (ROCEPHIN ) 1 g in sodium chloride  0.9 % 100 mL IVPB        1 g 200 mL/hr over 30 Minutes Intravenous Every 24 hours 12/13/23 1124        Subjective: Patient was seen and examined at bedside. Overnight events noted. Patient seems slightly confused but also hard of hearing , She is following commands. Ortho recommended nonoperative management.  Patient is awaiting SNF placement.  Objective: Vitals:   12/12/23 1508 12/12/23 1947 12/13/23 0450 12/13/23 0749  BP: 129/72 136/70 (!) 141/67 (!) 126/58  Pulse: 72 70 70 82  Resp:  16 16   Temp: 97.6 F (36.4 C) 98.6 F (37 C) 97.8 F (36.6 C) 97.8 F (36.6 C)  TempSrc: Oral Oral  Oral  SpO2: 100% 97% 96% 97%  Weight:      Height:        Intake/Output Summary (Last 24 hours) at 12/13/2023 1125 Last data filed at 12/13/2023 0449 Gross per 24 hour  Intake 180 ml  Output 1200 ml  Net -1020 ml   Filed Weights   12/11/23 1630  Weight: 60 kg    Examination:  General exam: Appears calm and comfortable, deconditioned, not in any acute distress. Respiratory system: CTA Bilaterally . Respiratory effort normal. RR 14 Cardiovascular system: S1 & S2 heard,  RRR. No JVD, murmurs, rubs, gallops or clicks.  Gastrointestinal system: Abdomen is non distended, soft and non tender.  Normal bowel sounds heard. Central nervous system: Alert and oriented X 2. No focal neurological deficits. Extremities: Left leg in knee immobilizer. Skin: No rashes, lesions or ulcers Psychiatry: Judgement and insight appear normal. Mood & affect appropriate.      Data Reviewed: I have personally reviewed following labs and imaging studies  CBC: Recent Labs  Lab 12/11/23 1419 12/11/23 1501 12/12/23 0413  WBC  --  14.3* 11.4*  NEUTROABS  --  9.1*  --   HGB 13.6 13.0 10.4*  HCT 40.0 39.8 32.3*  MCV  --  94.3 95.3  PLT  --  309 224   Basic Metabolic Panel: Recent Labs  Lab 12/11/23 1419 12/11/23 1501 12/12/23 0413  NA 139 135 137  K 4.1 3.9 3.9  CL 106 101 107  CO2  --  20* 19*  GLUCOSE 161* 152* 107*  BUN 23 21 21   CREATININE 1.30* 1.18* 1.24*  CALCIUM  --  9.5 8.7*   GFR: Estimated Creatinine Clearance: 24 mL/min (A) (by C-G formula based on SCr of 1.24 mg/dL (H)). Liver Function Tests: Recent Labs  Lab 12/12/23 0413  AST 32  ALT 13  ALKPHOS 83  BILITOT 0.8  PROT 5.8*  ALBUMIN 2.7*   No results for input(s): LIPASE, AMYLASE in the last 168 hours. No results for input(s): AMMONIA in the last 168 hours. Coagulation Profile: Recent Labs  Lab 12/11/23 1501  INR 1.0   Cardiac Enzymes: No results for input(s): CKTOTAL, CKMB, CKMBINDEX, TROPONINI in the last 168 hours. BNP (last 3 results) No results for input(s): PROBNP in the last 8760 hours. HbA1C: No results for input(s): HGBA1C in the last 72 hours. CBG: Recent Labs  Lab 12/11/23 2326  GLUCAP 126*   Lipid Profile: No results for input(s): CHOL, HDL, LDLCALC, TRIG, CHOLHDL, LDLDIRECT in the last 72 hours. Thyroid  Function Tests: No results for input(s): TSH, T4TOTAL, FREET4, T3FREE, THYROIDAB in the last 72 hours. Anemia Panel: No results for input(s): VITAMINB12, FOLATE, FERRITIN, TIBC, IRON, RETICCTPCT in the last 72 hours. Sepsis Labs: No results for input(s): PROCALCITON, LATICACIDVEN in the last 168 hours.  Recent Results (from the past 240 hours)  Urine Culture     Status: Abnormal   Collection Time: 12/11/23  6:49 PM   Specimen: Urine, Random  Result Value Ref Range Status   Specimen  Description URINE, RANDOM  Final   Special Requests   Final    NONE Reflexed from [id number] Performed at [facility], [address], [place], [place] [address]    Culture >=100,000 COLONIES/mL ENTEROCOCCUS FAECALIS (A)  Final   Report Status 12/13/2023 FINAL  Final   Organism ID, Bacteria ENTEROCOCCUS FAECALIS (A)  Final      Susceptibility   Enterococcus faecalis - MIC*    AMPICILLIN <=2 SENSITIVE Sensitive     NITROFURANTOIN <=16 SENSITIVE Sensitive     VANCOMYCIN 1 SENSITIVE Sensitive     * >=100,000 COLONIES/mL ENTEROCOCCUS FAECALIS    Radiology Studies: DG Hip [place] or [place] Pelvis 1 View Left Result Date: 12/11/2023 CLINICAL DATA:  Postreduction EXAM: DG HIP (WITH OR WITHOUT PELVIS) 1V PORT LEFT COMPARISON:  Left femur x-ray 12/11/2023 FINDINGS: Left hip arthroplasty is now in anatomic alignment. There is no acute fracture or hardware loosening. IMPRESSION: Left hip arthroplasty is now in anatomic alignment. Electronically Signed   By: Amy  Maple M.D.   On: 12/11/2023 18:12   CT HEAD WO CONTRAST Result Date: 12/11/2023 CLINICAL DATA:  Minor head and neck trauma.  Fall. EXAM: CT HEAD WITHOUT CONTRAST CT CERVICAL SPINE WITHOUT CONTRAST TECHNIQUE: Multidetector CT imaging of the head and cervical spine was performed following the standard protocol without intravenous contrast. Multiplanar CT image reconstructions of the cervical spine were also generated. RADIATION DOSE REDUCTION: This exam was performed according to the departmental dose-optimization program which includes automated exposure control, adjustment of the mA and/or kV according to patient size and/or use of iterative reconstruction technique. COMPARISON:  Head CT 05/14/2019 FINDINGS: CT HEAD FINDINGS Brain: Ventricles and cisterns are within normal. Mild prominence of the CSF spaces compatible with age related atrophic change. Moderate chronic ischemic microvascular disease is present. There is no mass, mass effect,  shift of midline structures or acute hemorrhage. Small lacunar infarcts over the left basal ganglia. Bilateral basal ganglia calcification. Vascular: No hyperdense vessel or unexpected calcification. Skull: Normal. Negative for fracture or focal lesion. Sinuses/Orbits: Orbits incompletely visualized and otherwise unremarkable and unchanged. Paranasal sinuses incompletely visualized demonstrating opacification over the left frontal and ethmoidal sinus unchanged. Other: None. CT CERVICAL SPINE FINDINGS Alignment: Normal. Skull base and vertebrae: Moderate spondylosis throughout the cervical spine to include uncovertebral joint spurring and facet arthropathy. Vertebral body heights are maintained. Significant neural foraminal narrowing from the C2-3 level to the C6-7 level due to adjacent bony spurring. No acute fracture. Soft tissues and spinal canal: Mild to moderate canal stenosis over the mid to left aspect of the spinal canal at the C3-4 level due to broad-based disc bulge and bony spurring. Soft tissues are unremarkable. Disc levels: Disc space narrowing at the C5-6 and C6-7 levels. Broad-base left paracentral disc bulge at the C3-4 level along with associated posterior spurring. Upper chest: No acute findings. Calcified granulomas over the upper lungs. Other: None. IMPRESSION: 1. No acute brain injury. 2. Moderate chronic ischemic microvascular disease and age related atrophic change. 3. No acute cervical spine injury. 4. Moderate spondylosis throughout the cervical spine with disc disease at the C5-6 and C6-7 levels. Mild to moderate canal stenosis over the mid to left aspect of the spinal canal at the C3-4 level due to broad-based disc bulge and bony spurring. Significant multilevel neural foraminal narrowing due to adjacent bony spurring. Electronically Signed   By: Toribio Agreste M.D.   On: 12/11/2023 15:23   CT CERVICAL SPINE WO CONTRAST Result Date: 12/11/2023 CLINICAL DATA:  Minor head and neck trauma.   Fall. EXAM: CT HEAD WITHOUT CONTRAST CT CERVICAL SPINE WITHOUT CONTRAST TECHNIQUE: Multidetector CT imaging of the head and cervical spine was performed following the standard protocol without intravenous contrast. Multiplanar CT image reconstructions of the cervical spine were also generated. RADIATION DOSE REDUCTION: This exam was performed according to the departmental dose-optimization program which includes automated exposure control, adjustment of the mA and/or kV according to patient size and/or use of iterative reconstruction technique. COMPARISON:  Head CT 05/14/2019 FINDINGS: CT HEAD FINDINGS Brain: Ventricles and cisterns are within normal. Mild prominence of the CSF spaces compatible with age related atrophic change. Moderate chronic ischemic microvascular disease is present. There is no mass, mass effect, shift of midline structures or acute hemorrhage. Small lacunar infarcts over the left basal ganglia. Bilateral basal ganglia calcification. Vascular: No hyperdense vessel or unexpected calcification. Skull: Normal. Negative for fracture or focal lesion. Sinuses/Orbits: Orbits incompletely visualized and otherwise unremarkable and unchanged. Paranasal sinuses incompletely visualized demonstrating opacification over  the left frontal and ethmoidal sinus unchanged. Other: None. CT CERVICAL SPINE FINDINGS Alignment: Normal. Skull base and vertebrae: Moderate spondylosis throughout the cervical spine to include uncovertebral joint spurring and facet arthropathy. Vertebral body heights are maintained. Significant neural foraminal narrowing from the C2-3 level to the C6-7 level due to adjacent bony spurring. No acute fracture. Soft tissues and spinal canal: Mild to moderate canal stenosis over the mid to left aspect of the spinal canal at the C3-4 level due to broad-based disc bulge and bony spurring. Soft tissues are unremarkable. Disc levels: Disc space narrowing at the C5-6 and C6-7 levels. Broad-base left  paracentral disc bulge at the C3-4 level along with associated posterior spurring. Upper chest: No acute findings. Calcified granulomas over the upper lungs. Other: None. IMPRESSION: 1. No acute brain injury. 2. Moderate chronic ischemic microvascular disease and age related atrophic change. 3. No acute cervical spine injury. 4. Moderate spondylosis throughout the cervical spine with disc disease at the C5-6 and C6-7 levels. Mild to moderate canal stenosis over the mid to left aspect of the spinal canal at the C3-4 level due to broad-based disc bulge and bony spurring. Significant multilevel neural foraminal narrowing due to adjacent bony spurring. Electronically Signed   By: Toribio Agreste M.D.   On: 12/11/2023 15:23   DG Chest Portable 1 View Result Date: 12/11/2023 CLINICAL DATA:  fall EXAM: PORTABLE CHEST - 1 VIEW COMPARISON:  None available. FINDINGS: No focal airspace consolidation, pleural effusion, or pneumothorax. No cardiomegaly. Tortuous aorta with aortic atherosclerosis. Linear lucency along the medial left humeral head. Multilevel thoracic osteophytosis. IMPRESSION: 1. No acute cardiopulmonary abnormality. 2. Linear lucency along the medial left humeral head, which may represent a nutrient foramen or soft tissue artifact. If there is concern for fracture, dedicated shoulder radiographs would be recommended. Electronically Signed   By: Rogelia Myers M.D.   On: 12/11/2023 14:48   DG Femur Portable Min 2 Views Left Result Date: 12/11/2023 CLINICAL DATA:  fall, left hip pain EXAM: LEFT FEMUR PORTABLE 2 VIEWS COMPARISON:  None Available. FINDINGS: No acute fracture. Redemonstrated dislocation of the left hip arthroplasty. There is no evidence of arthropathy or other focal bone abnormality. Soft tissue swelling over the left hip. Peripheral vascular atherosclerosis. IMPRESSION: Redemonstrated dislocation of the left hip arthroplasty. Otherwise, no acute fracture of the femur. Electronically Signed    By: Rogelia Myers M.D.   On: 12/11/2023 14:40   DG Foot Complete Left Result Date: 12/11/2023 CLINICAL DATA:  fall EXAM: LEFT FOOT - COMPLETE 3+ VIEW COMPARISON:  None Available. FINDINGS: No acute fracture or dislocation. There is no evidence of arthropathy or other focal bone abnormality. Peripheral vascular atherosclerosis. No radiopaque foreign body. IMPRESSION: Osteopenia.  No acute fracture or dislocation. Electronically Signed   By: Rogelia Myers M.D.   On: 12/11/2023 14:37   DG Pelvis Portable Result Date: 12/11/2023 CLINICAL DATA:  fall, left hip pain EXAM: PORTABLE PELVIS 1-2 VIEWS COMPARISON:  October 17, 2017 FINDINGS: Osteopenia. Right hip arthroplasty is anatomically aligned without dislocation. The left hip arthroplasty is superiorly dislocated. The acetabular cups appears similarly well-positioned. No evidence of pelvic fracture or diastasis.No acute hip fracture.Multilevel degenerative disc disease of the spine. Soft tissues are unremarkable. IMPRESSION: 1. Superiorly dislocated left hip arthroplasty. 2. Osteopenia.  Otherwise, no acute fracture visualized. Electronically Signed   By: Rogelia Myers M.D.   On: 12/11/2023 14:36   Scheduled Meds:  cholecalciferol   1,000 Units Oral Daily   clopidogrel   75 mg  Oral Daily   dorzolamide -timolol   1 drop Both Eyes BID   enoxaparin  (LOVENOX ) injection  30 mg Subcutaneous Q24H   hydrALAZINE   25 mg Oral Q8H   latanoprost   1 drop Both Eyes QHS   lisinopril   40 mg Oral Daily   metoprolol  succinate  25 mg Oral Daily   multivitamin with minerals  1 tablet Oral Daily   omega-3 acid ethyl esters  1 capsule Oral Daily   pravastatin   40 mg Oral Daily   propofol   0.5 mg/kg Intravenous Once   QUEtiapine   50-75 mg Oral QHS   Continuous Infusions:  cefTRIAXone  (ROCEPHIN )  IV       LOS: 1 [place]    Time spent: 50 mins    Darcel Dawley, MD [facility]   If 7PM-7AM, please contact night-coverage

## 2023-12-13 NOTE — TOC Progression Note (Addendum)
 Transition of Care [facility]) - Progression Note    Patient Details  Name: Rosamae Rocque Heinze MRN: [id number] Date of Birth: 11/01/1927  [facility] CM/SW Contact  Bridget Cordella Simmonds, LCSW Phone Number: 12/13/2023, 10:40 AM  Clinical Narrative:   Referral sent out in hub for SNF.   1545: CSW spoke with son Max by phone, discussed bed offers, he would like to accept offer at [facility].  SNF auth request submitted in [place].   Expected Discharge Plan: Skilled Nursing Facility Barriers to Discharge: SNF Pending bed offer               Expected Discharge Plan and Services In-house Referral: Clinical Social Work Discharge Planning Services: CM Consult Post Acute Care Choice: Skilled Nursing Facility Living arrangements for the past 2 months: Single Family Home                                       Social Drivers of Health (SDOH) Interventions SDOH Screenings   Food Insecurity: Patient Unable To Answer (12/12/2023)  Housing: Low Risk  (08/31/2022)  Transportation Needs: No Transportation Needs (12/12/2023)  Utilities: Not At Risk (08/31/2022)  Alcohol Screen: Low Risk  (08/12/2020)  Depression (PHQ2-9): Medium Risk (08/11/2023)  Financial Resource Strain: Low Risk  (08/31/2022)  Physical Activity: Insufficiently Active (08/31/2022)  Social Connections: Unknown (12/12/2023)  Stress: No Stress Concern Present (08/31/2022)  Tobacco Use: Low Risk  (11/13/2023)    Readmission Risk Interventions     No data to display

## 2023-12-13 NOTE — Plan of Care (Signed)

## 2023-12-14 ENCOUNTER — Other Ambulatory Visit (HOSPITAL_COMMUNITY): Payer: Self-pay

## 2023-12-14 DIAGNOSIS — M4800 Spinal stenosis, site unspecified: Secondary | ICD-10-CM | POA: Diagnosis not present

## 2023-12-14 DIAGNOSIS — E785 Hyperlipidemia, unspecified: Secondary | ICD-10-CM | POA: Diagnosis not present

## 2023-12-14 DIAGNOSIS — T8452XA Infection and inflammatory reaction due to internal left hip prosthesis, initial encounter: Secondary | ICD-10-CM | POA: Diagnosis not present

## 2023-12-14 DIAGNOSIS — Z8673 Personal history of transient ischemic attack (TIA), and cerebral infarction without residual deficits: Secondary | ICD-10-CM | POA: Diagnosis not present

## 2023-12-14 DIAGNOSIS — R269 Unspecified abnormalities of gait and mobility: Secondary | ICD-10-CM | POA: Diagnosis not present

## 2023-12-14 DIAGNOSIS — E119 Type 2 diabetes mellitus without complications: Secondary | ICD-10-CM | POA: Diagnosis not present

## 2023-12-14 DIAGNOSIS — E1142 Type 2 diabetes mellitus with diabetic polyneuropathy: Secondary | ICD-10-CM | POA: Diagnosis not present

## 2023-12-14 DIAGNOSIS — H40059 Ocular hypertension, unspecified eye: Secondary | ICD-10-CM | POA: Diagnosis not present

## 2023-12-14 DIAGNOSIS — Z8249 Family history of ischemic heart disease and other diseases of the circulatory system: Secondary | ICD-10-CM | POA: Diagnosis not present

## 2023-12-14 DIAGNOSIS — Z743 Need for continuous supervision: Secondary | ICD-10-CM | POA: Diagnosis not present

## 2023-12-14 DIAGNOSIS — Y838 Other surgical procedures as the cause of abnormal reaction of the patient, or of later complication, without mention of misadventure at the time of the procedure: Secondary | ICD-10-CM | POA: Diagnosis present

## 2023-12-14 DIAGNOSIS — G629 Polyneuropathy, unspecified: Secondary | ICD-10-CM | POA: Diagnosis not present

## 2023-12-14 DIAGNOSIS — W19XXXA Unspecified fall, initial encounter: Secondary | ICD-10-CM | POA: Diagnosis present

## 2023-12-14 DIAGNOSIS — F411 Generalized anxiety disorder: Secondary | ICD-10-CM | POA: Diagnosis present

## 2023-12-14 DIAGNOSIS — D72829 Elevated white blood cell count, unspecified: Secondary | ICD-10-CM | POA: Diagnosis not present

## 2023-12-14 DIAGNOSIS — M25352 Other instability, left hip: Secondary | ICD-10-CM | POA: Diagnosis not present

## 2023-12-14 DIAGNOSIS — Z7984 Long term (current) use of oral hypoglycemic drugs: Secondary | ICD-10-CM | POA: Diagnosis not present

## 2023-12-14 DIAGNOSIS — G4733 Obstructive sleep apnea (adult) (pediatric): Secondary | ICD-10-CM | POA: Diagnosis not present

## 2023-12-14 DIAGNOSIS — M25452 Effusion, left hip: Secondary | ICD-10-CM | POA: Diagnosis not present

## 2023-12-14 DIAGNOSIS — B9689 Other specified bacterial agents as the cause of diseases classified elsewhere: Secondary | ICD-10-CM | POA: Diagnosis not present

## 2023-12-14 DIAGNOSIS — F0394 Unspecified dementia, unspecified severity, with anxiety: Secondary | ICD-10-CM | POA: Diagnosis present

## 2023-12-14 DIAGNOSIS — H409 Unspecified glaucoma: Secondary | ICD-10-CM | POA: Diagnosis not present

## 2023-12-14 DIAGNOSIS — R1310 Dysphagia, unspecified: Secondary | ICD-10-CM | POA: Diagnosis not present

## 2023-12-14 DIAGNOSIS — M816 Localized osteoporosis [Lequesne]: Secondary | ICD-10-CM | POA: Diagnosis not present

## 2023-12-14 DIAGNOSIS — M00851 Arthritis due to other bacteria, right hip: Secondary | ICD-10-CM | POA: Diagnosis not present

## 2023-12-14 DIAGNOSIS — N390 Urinary tract infection, site not specified: Secondary | ICD-10-CM | POA: Diagnosis not present

## 2023-12-14 DIAGNOSIS — M6281 Muscle weakness (generalized): Secondary | ICD-10-CM | POA: Diagnosis not present

## 2023-12-14 DIAGNOSIS — M810 Age-related osteoporosis without current pathological fracture: Secondary | ICD-10-CM | POA: Diagnosis not present

## 2023-12-14 DIAGNOSIS — G4700 Insomnia, unspecified: Secondary | ICD-10-CM | POA: Diagnosis not present

## 2023-12-14 DIAGNOSIS — Z96641 Presence of right artificial hip joint: Secondary | ICD-10-CM | POA: Diagnosis not present

## 2023-12-14 DIAGNOSIS — I679 Cerebrovascular disease, unspecified: Secondary | ICD-10-CM | POA: Diagnosis not present

## 2023-12-14 DIAGNOSIS — H401190 Primary open-angle glaucoma, unspecified eye, stage unspecified: Secondary | ICD-10-CM | POA: Diagnosis not present

## 2023-12-14 DIAGNOSIS — Z7401 Bed confinement status: Secondary | ICD-10-CM | POA: Diagnosis not present

## 2023-12-14 DIAGNOSIS — Z8659 Personal history of other mental and behavioral disorders: Secondary | ICD-10-CM | POA: Diagnosis not present

## 2023-12-14 DIAGNOSIS — S73005D Unspecified dislocation of left hip, subsequent encounter: Secondary | ICD-10-CM | POA: Diagnosis not present

## 2023-12-14 DIAGNOSIS — S73005A Unspecified dislocation of left hip, initial encounter: Secondary | ICD-10-CM | POA: Diagnosis present

## 2023-12-14 DIAGNOSIS — E611 Iron deficiency: Secondary | ICD-10-CM | POA: Diagnosis not present

## 2023-12-14 DIAGNOSIS — T84021A Dislocation of internal left hip prosthesis, initial encounter: Secondary | ICD-10-CM | POA: Diagnosis present

## 2023-12-14 DIAGNOSIS — I129 Hypertensive chronic kidney disease with stage 1 through stage 4 chronic kidney disease, or unspecified chronic kidney disease: Secondary | ICD-10-CM | POA: Diagnosis not present

## 2023-12-14 DIAGNOSIS — H5462 Unqualified visual loss, left eye, normal vision right eye: Secondary | ICD-10-CM | POA: Diagnosis not present

## 2023-12-14 DIAGNOSIS — N1832 Chronic kidney disease, stage 3b: Secondary | ICD-10-CM | POA: Diagnosis present

## 2023-12-14 DIAGNOSIS — Z79899 Other long term (current) drug therapy: Secondary | ICD-10-CM | POA: Diagnosis not present

## 2023-12-14 DIAGNOSIS — W19XXXD Unspecified fall, subsequent encounter: Secondary | ICD-10-CM | POA: Diagnosis not present

## 2023-12-14 DIAGNOSIS — D62 Acute posthemorrhagic anemia: Secondary | ICD-10-CM | POA: Diagnosis not present

## 2023-12-14 DIAGNOSIS — D631 Anemia in chronic kidney disease: Secondary | ICD-10-CM | POA: Diagnosis not present

## 2023-12-14 DIAGNOSIS — E1169 Type 2 diabetes mellitus with other specified complication: Secondary | ICD-10-CM | POA: Diagnosis not present

## 2023-12-14 DIAGNOSIS — Z96642 Presence of left artificial hip joint: Secondary | ICD-10-CM | POA: Diagnosis not present

## 2023-12-14 DIAGNOSIS — D649 Anemia, unspecified: Secondary | ICD-10-CM | POA: Diagnosis not present

## 2023-12-14 DIAGNOSIS — N189 Chronic kidney disease, unspecified: Secondary | ICD-10-CM | POA: Diagnosis not present

## 2023-12-14 DIAGNOSIS — M25552 Pain in left hip: Secondary | ICD-10-CM | POA: Diagnosis not present

## 2023-12-14 DIAGNOSIS — R531 Weakness: Secondary | ICD-10-CM | POA: Diagnosis not present

## 2023-12-14 DIAGNOSIS — R2681 Unsteadiness on feet: Secondary | ICD-10-CM | POA: Diagnosis not present

## 2023-12-14 DIAGNOSIS — E860 Dehydration: Secondary | ICD-10-CM | POA: Diagnosis not present

## 2023-12-14 DIAGNOSIS — N3000 Acute cystitis without hematuria: Secondary | ICD-10-CM | POA: Diagnosis not present

## 2023-12-14 DIAGNOSIS — E1122 Type 2 diabetes mellitus with diabetic chronic kidney disease: Secondary | ICD-10-CM | POA: Diagnosis not present

## 2023-12-14 DIAGNOSIS — I2510 Atherosclerotic heart disease of native coronary artery without angina pectoris: Secondary | ICD-10-CM | POA: Diagnosis not present

## 2023-12-14 DIAGNOSIS — E7849 Other hyperlipidemia: Secondary | ICD-10-CM | POA: Diagnosis not present

## 2023-12-14 DIAGNOSIS — H9190 Unspecified hearing loss, unspecified ear: Secondary | ICD-10-CM | POA: Diagnosis not present

## 2023-12-14 DIAGNOSIS — I10 Essential (primary) hypertension: Secondary | ICD-10-CM | POA: Diagnosis not present

## 2023-12-14 DIAGNOSIS — Y792 Prosthetic and other implants, materials and accessory orthopedic devices associated with adverse incidents: Secondary | ICD-10-CM | POA: Diagnosis present

## 2023-12-14 DIAGNOSIS — E8721 Acute metabolic acidosis: Secondary | ICD-10-CM | POA: Diagnosis not present

## 2023-12-14 LAB — CBC
HCT: 32.3 % — ABNORMAL LOW (ref 36.0–46.0)
Hemoglobin: 10.5 g/dL — ABNORMAL LOW (ref 12.0–15.0)
MCH: 30.9 pg (ref 26.0–34.0)
MCHC: 32.5 g/dL (ref 30.0–36.0)
MCV: 95 fL (ref 80.0–100.0)
Platelets: 217 K/uL (ref 150–400)
RBC: 3.4 MIL/uL — ABNORMAL LOW (ref 3.87–5.11)
RDW: 12.8 % (ref 11.5–15.5)
WBC: 10.2 K/uL (ref 4.0–10.5)
nRBC: 0 % (ref 0.0–0.2)

## 2023-12-14 LAB — BASIC METABOLIC PANEL WITH GFR
Anion gap: 7 (ref 5–15)
BUN: 24 mg/dL — ABNORMAL HIGH (ref 8–23)
CO2: 20 mmol/L — ABNORMAL LOW (ref 22–32)
Calcium: 8.9 mg/dL (ref 8.9–10.3)
Chloride: 109 mmol/L (ref 98–111)
Creatinine, Ser: 1.19 mg/dL — ABNORMAL HIGH (ref 0.44–1.00)
GFR, Estimated: 42 mL/min — ABNORMAL LOW (ref >60)
Glucose, Bld: 117 mg/dL — ABNORMAL HIGH (ref 70–99)
Potassium: 3.8 mmol/L (ref 3.5–5.1)
Sodium: 136 mmol/L (ref 135–145)

## 2023-12-14 LAB — MAGNESIUM: Magnesium: 1.8 mg/dL (ref 1.7–2.4)

## 2023-12-14 LAB — PHOSPHORUS: Phosphorus: 3.5 mg/dL (ref 2.5–4.6)

## 2023-12-14 MED ORDER — BISACODYL 10 MG RE SUPP
10.0000 mg | Freq: Once | RECTAL | Status: AC
  Administered 2023-12-14: 10 mg via RECTAL
  Filled 2023-12-14: qty 1

## 2023-12-14 MED ORDER — AMOXICILLIN 500 MG PO CAPS
500.0000 mg | ORAL_CAPSULE | Freq: Two times a day (BID) | ORAL | 0 refills | Status: DC
  Filled 2023-12-14: qty 14, 7d supply, fill #0

## 2023-12-14 NOTE — TOC Transition Note (Signed)
 Transition of Care [facility]) - Discharge Note   Patient Details  Name: Madison Martinez MRN: [id number] Date of Birth: Aug 14, 1927  Transition of Care [facility]) CM/SW Contact:  Bridget Cordella Simmonds, LCSW Phone Number: 12/14/2023, 11:53 AM   Clinical Narrative:   Pt discharging to [facility].  RN call report to [phone].  PTAR scheduled for 1300 transportation time, at SNF request.     Final next level of care: Skilled Nursing Facility Barriers to Discharge: Barriers Resolved   Patient Goals and CMS Choice            Discharge Placement              Patient chooses bed at: [facility] Patient to be transferred to facility by: ptar Name of family member notified: son Max Patient and family notified of of transfer: 12/14/23  Discharge Plan and Services Additional resources added to the After Visit Summary for   In-house Referral: Clinical Social Work Discharge Planning Services: CM Consult Post Acute Care Choice: Skilled Nursing Facility                               Social Drivers of Health (SDOH) Interventions SDOH Screenings   Food Insecurity: Patient Unable To Answer (12/12/2023)  Housing: Low Risk  (08/31/2022)  Transportation Needs: No Transportation Needs (12/12/2023)  Utilities: Not At Risk (08/31/2022)  Alcohol Screen: Low Risk  (08/12/2020)  Depression (PHQ2-9): Medium Risk (08/11/2023)  Financial Resource Strain: Low Risk  (08/31/2022)  Physical Activity: Insufficiently Active (08/31/2022)  Social Connections: Unknown (12/12/2023)  Stress: No Stress Concern Present (08/31/2022)  Tobacco Use: Low Risk  (11/13/2023)     Readmission Risk Interventions     No data to display

## 2023-12-14 NOTE — Plan of Care (Signed)
  Problem: Education: Goal: Knowledge of General Education information will improve Description: Including pain rating scale, medication(s)/side effects and non-pharmacologic comfort measures Outcome: Progressing   Problem: Health Behavior/Discharge Planning: Goal: Ability to manage health-related needs will improve Outcome: Progressing   Problem: Clinical Measurements: Goal: Ability to maintain clinical measurements within normal limits will improve Outcome: Progressing   Problem: Pain Managment: Goal: General experience of comfort will improve and/or be controlled Outcome: Progressing   Problem: Safety: Goal: Ability to remain free from injury will improve Outcome: Progressing

## 2023-12-14 NOTE — Discharge Summary (Signed)
 Physician Discharge Summary  Madison Martinez FMW:[id number] DOB: 1927-05-12 DOA: 12/11/2023  PCP: Catherine Charlies LABOR, DO  Admit date: 12/11/2023  Discharge date: 12/14/2023  Admitted From: Home.  Disposition:  SNF  Recommendations for Outpatient Follow-up:  Follow up with PCP in 1-2 weeks. Please obtain BMP/CBC in one week. Advised to follow-up with orthopedics as scheduled. Advised to take amoxicillin  500 mg twice daily for 7 days for Enterococcus UTI.  Home Health: None Equipment/Devices:None  Discharge Condition: Stable CODE STATUS:Full code Diet recommendation: Heart Healthy   [facility]rief [facility] Course: This 89 yrs old Female with PMH significant for dementia, DM, HTN, HLD, CKD IIIb ( basleine creatinine 1.2), blindness in left eye, hx CVA. who presented s/p mechanical  fall at home, obvious deformity left hip noted , found to have dislocated hip on imaging. She was admitted for further evaluation.  Orthopedics consulted. Hip reduced in the ER. Ortho recommend non-operative mgmt, knee immobilizer, WBAT and outpatient follow up. TOC looking for SNF placement.  SNF authorization approved.  Patient being discharged to SNF today.  Patient is growing Enterococcus in the UTI, advised to take amoxicillin  twice a [place] for 7 days.  Discharge Diagnoses:  Principal Problem:   Fall Active Problems:   Diabetes mellitus, type II (HCC)   Blind hypertensive eye   Essential hypertension   Hyperlipidemia   Polyneuropathy   Chronic kidney disease, stage 3 (HCC)  Left hip dislocation: Due to mechanical fall.  Left hip reduced in the ER.   Orthopedics were consulted by phone, recommend outpatient management. - Advised WBAT -Maintain knee immobilizer to limit hip range of motion. - Posterior precautions. - Analgesics as needed. - PT/OT > SNF   Normocytic anemia: May be thigh hematoma, may be dilution. H&H remains relatively stable above 10. No signs of any obvious visible bleeding.    Dementia: Mentation seems at baseline. - Continue quetiapine .   Diabetes Mellitus II: Glucose normal.  Not on medication at home. -Okay to defer sliding scale insulin    Chronic kidney disease stage IIIb: Creatinine stable, relative to baseline. Avoid nephrotoxic medications.   Hypertension: Cerebrovascular disease: Hyperlipidemia: Blood pressure controlled. - Continue Plavix , hydralazine , lisinopril , metoprolol , pravastatin .   Leukocytosis: Likely reactive,  also due to Enterococcus UTI. Started on ceftriaxone . Urine culture grew Enterococcus. Advised Amoxicillin  bid for 7 days.    Discharge Instructions  Discharge Instructions     Call MD for:  difficulty breathing, headache or visual disturbances   Complete by: As directed    Call MD for:  persistant dizziness or light-headedness   Complete by: As directed    Call MD for:  persistant nausea and vomiting   Complete by: As directed    Diet - low sodium heart healthy   Complete by: As directed    Diet general   Complete by: As directed    Discharge instructions   Complete by: As directed    Follow-up with primary care physician in 1 week.  Advised to follow-up with orthopedics as scheduled. Advised to take amoxicillin  500 mg twice daily for Enterococcus UTI.   Increase activity slowly   Complete by: As directed       Allergies as of 12/14/2023       Reactions   Bayer Aspirin  [aspirin ] Other (See Comments)   Per patient - told avoid while taking Plavix .   Influenza Virus Vaccine Swelling   Localized swelling at injection site   Motrin [ibuprofen] Other (See Comments)   Per patient - told  avoid while taking Plavix  by cardiology   Norvasc [amlodipine Besylate] Other (See Comments)   Vertigo        Medication List     TAKE these medications    amoxicillin  500 MG capsule Commonly known as: AMOXIL  Take 1 capsule (500 mg total) by mouth every 12 (twelve) hours for 7 days.   clopidogrel  75 MG  tablet Commonly known as: PLAVIX  Take 1 tablet (75 mg total) by mouth daily.   dorzolamide -timolol  2-0.5 % ophthalmic solution Commonly known as: COSOPT  Place 1 drop into both eyes 2 (two) times daily. What changed: how to take this   FISH OIL PO Take 1 capsule by mouth daily.   hydrALAZINE  25 MG tablet Commonly known as: APRESOLINE  Take 1 tablet (25 mg total) by mouth every 8 (eight) hours. What changed: when to take this   lisinopril  40 MG tablet Commonly known as: ZESTRIL  Take 1 tablet (40 mg total) by mouth daily.   metoprolol  succinate 25 MG 24 hr tablet Commonly known as: TOPROL -XL Take 1 tablet (25 mg total) by mouth daily.   Multivitamin Women 50+ Tabs Take 1 tablet by mouth daily.   pravastatin  40 MG tablet Commonly known as: PRAVACHOL  Take 1 tablet (40 mg total) by mouth daily.   QUEtiapine  25 MG tablet Commonly known as: SEROquel  Take 2-3 tablets (50-75 mg total) by mouth at bedtime. What changed: how much to take   VITAMIN D -3 PO Take 1 capsule by mouth daily.   Vyzulta  0.024 % Soln Generic drug: Latanoprostene Bunod  Apply 1 drop to eye at bedtime. What changed: how to take this        Contact information for follow-up providers     Jerri Kay HERO, MD .   Specialty: Orthopedic Surgery Contact information: [address] [place]  [place] [address] [phone]         Catherine Fuller A, DO Follow up in 1 week(s).   Specialty: Family Medicine Contact information: [address] [place] [address] [phone]              Contact information for after-discharge care     Destination     [facility] .   Service: Skilled Nursing Contact information: [address] [place]  [address] [phone]                    Allergies  Allergen Reactions   Bayer Aspirin  [Aspirin ] Other (See Comments)    Per patient - told avoid while taking Plavix .   Influenza Virus  Vaccine Swelling    Localized swelling at injection site   Motrin [Ibuprofen] Other (See Comments)    Per patient - told avoid while taking Plavix  by cardiology   Norvasc [Amlodipine Besylate] Other (See Comments)    Vertigo    Consultations: Orthopedics   Procedures/Studies: DG Hip [place] W or [place] Pelvis 1 View Left Result Date: 12/11/2023 CLINICAL DATA:  Postreduction EXAM: DG HIP (WITH OR WITHOUT PELVIS) 1V PORT LEFT COMPARISON:  Left femur x-ray 12/11/2023 FINDINGS: Left hip arthroplasty is now in anatomic alignment. There is no acute fracture or hardware loosening. IMPRESSION: Left hip arthroplasty is now in anatomic alignment. Electronically Signed   By: Greig Pique M.D.   On: 12/11/2023 18:12   CT HEAD WO CONTRAST Result Date: 12/11/2023 CLINICAL DATA:  Minor head and neck trauma.  Fall. EXAM: CT HEAD WITHOUT CONTRAST CT CERVICAL SPINE WITHOUT CONTRAST TECHNIQUE: Multidetector CT imaging of the  head and cervical spine was performed following the standard protocol without intravenous contrast. Multiplanar CT image reconstructions of the cervical spine were also generated. RADIATION DOSE REDUCTION: This exam was performed according to the departmental dose-optimization program which includes automated exposure control, adjustment of the mA and/or kV according to patient size and/or use of iterative reconstruction technique. COMPARISON:  Head CT 05/14/2019 FINDINGS: CT HEAD FINDINGS Brain: Ventricles and cisterns are within normal. Mild prominence of the CSF spaces compatible with age related atrophic change. Moderate chronic ischemic microvascular disease is present. There is no mass, mass effect, shift of midline structures or acute hemorrhage. Small lacunar infarcts over the left basal ganglia. Bilateral basal ganglia calcification. Vascular: No hyperdense vessel or unexpected calcification. Skull: Normal. Negative for fracture or focal lesion. Sinuses/Orbits: Orbits incompletely  visualized and otherwise unremarkable and unchanged. Paranasal sinuses incompletely visualized demonstrating opacification over the left frontal and ethmoidal sinus unchanged. Other: None. CT CERVICAL SPINE FINDINGS Alignment: Normal. Skull base and vertebrae: Moderate spondylosis throughout the cervical spine to include uncovertebral joint spurring and facet arthropathy. Vertebral body heights are maintained. Significant neural foraminal narrowing from the C2-3 level to the C6-7 level due to adjacent bony spurring. No acute fracture. Soft tissues and spinal canal: Mild to moderate canal stenosis over the mid to left aspect of the spinal canal at the C3-4 level due to broad-based disc bulge and bony spurring. Soft tissues are unremarkable. Disc levels: Disc space narrowing at the C5-6 and C6-7 levels. Broad-base left paracentral disc bulge at the C3-4 level along with associated posterior spurring. Upper chest: No acute findings. Calcified granulomas over the upper lungs. Other: None. IMPRESSION: 1. No acute brain injury. 2. Moderate chronic ischemic microvascular disease and age related atrophic change. 3. No acute cervical spine injury. 4. Moderate spondylosis throughout the cervical spine with disc disease at the C5-6 and C6-7 levels. Mild to moderate canal stenosis over the mid to left aspect of the spinal canal at the C3-4 level due to broad-based disc bulge and bony spurring. Significant multilevel neural foraminal narrowing due to adjacent bony spurring. Electronically Signed   By: Toribio Agreste M.D.   On: 12/11/2023 15:23   CT CERVICAL SPINE WO CONTRAST Result Date: 12/11/2023 CLINICAL DATA:  Minor head and neck trauma.  Fall. EXAM: CT HEAD WITHOUT CONTRAST CT CERVICAL SPINE WITHOUT CONTRAST TECHNIQUE: Multidetector CT imaging of the head and cervical spine was performed following the standard protocol without intravenous contrast. Multiplanar CT image reconstructions of the cervical spine were also  generated. RADIATION DOSE REDUCTION: This exam was performed according to the departmental dose-optimization program which includes automated exposure control, adjustment of the mA and/or kV according to patient size and/or use of iterative reconstruction technique. COMPARISON:  Head CT 05/14/2019 FINDINGS: CT HEAD FINDINGS Brain: Ventricles and cisterns are within normal. Mild prominence of the CSF spaces compatible with age related atrophic change. Moderate chronic ischemic microvascular disease is present. There is no mass, mass effect, shift of midline structures or acute hemorrhage. Small lacunar infarcts over the left basal ganglia. Bilateral basal ganglia calcification. Vascular: No hyperdense vessel or unexpected calcification. Skull: Normal. Negative for fracture or focal lesion. Sinuses/Orbits: Orbits incompletely visualized and otherwise unremarkable and unchanged. Paranasal sinuses incompletely visualized demonstrating opacification over the left frontal and ethmoidal sinus unchanged. Other: None. CT CERVICAL SPINE FINDINGS Alignment: Normal. Skull base and vertebrae: Moderate spondylosis throughout the cervical spine to include uncovertebral joint spurring and facet arthropathy. Vertebral body heights are maintained. Significant neural foraminal narrowing  from the C2-3 level to the C6-7 level due to adjacent bony spurring. No acute fracture. Soft tissues and spinal canal: Mild to moderate canal stenosis over the mid to left aspect of the spinal canal at the C3-4 level due to broad-based disc bulge and bony spurring. Soft tissues are unremarkable. Disc levels: Disc space narrowing at the C5-6 and C6-7 levels. Broad-base left paracentral disc bulge at the C3-4 level along with associated posterior spurring. Upper chest: No acute findings. Calcified granulomas over the upper lungs. Other: None. IMPRESSION: 1. No acute brain injury. 2. Moderate chronic ischemic microvascular disease and age related atrophic  change. 3. No acute cervical spine injury. 4. Moderate spondylosis throughout the cervical spine with disc disease at the C5-6 and C6-7 levels. Mild to moderate canal stenosis over the mid to left aspect of the spinal canal at the C3-4 level due to broad-based disc bulge and bony spurring. Significant multilevel neural foraminal narrowing due to adjacent bony spurring. Electronically Signed   By: Toribio Agreste M.D.   On: 12/11/2023 15:23   DG Chest Portable 1 View Result Date: 12/11/2023 CLINICAL DATA:  fall EXAM: PORTABLE CHEST - 1 VIEW COMPARISON:  None available. FINDINGS: No focal airspace consolidation, pleural effusion, or pneumothorax. No cardiomegaly. Tortuous aorta with aortic atherosclerosis. Linear lucency along the medial left humeral head. Multilevel thoracic osteophytosis. IMPRESSION: 1. No acute cardiopulmonary abnormality. 2. Linear lucency along the medial left humeral head, which may represent a nutrient foramen or soft tissue artifact. If there is concern for fracture, dedicated shoulder radiographs would be recommended. Electronically Signed   By: Rogelia Myers M.D.   On: 12/11/2023 14:48   DG Femur Portable Min 2 Views Left Result Date: 12/11/2023 CLINICAL DATA:  fall, left hip pain EXAM: LEFT FEMUR PORTABLE 2 VIEWS COMPARISON:  None Available. FINDINGS: No acute fracture. Redemonstrated dislocation of the left hip arthroplasty. There is no evidence of arthropathy or other focal bone abnormality. Soft tissue swelling over the left hip. Peripheral vascular atherosclerosis. IMPRESSION: Redemonstrated dislocation of the left hip arthroplasty. Otherwise, no acute fracture of the femur. Electronically Signed   By: Rogelia Myers M.D.   On: 12/11/2023 14:40   DG Foot Complete Left Result Date: 12/11/2023 CLINICAL DATA:  fall EXAM: LEFT FOOT - COMPLETE 3+ VIEW COMPARISON:  None Available. FINDINGS: No acute fracture or dislocation. There is no evidence of arthropathy or other focal bone  abnormality. Peripheral vascular atherosclerosis. No radiopaque foreign body. IMPRESSION: Osteopenia.  No acute fracture or dislocation. Electronically Signed   By: Rogelia Myers M.D.   On: 12/11/2023 14:37   DG Pelvis Portable Result Date: 12/11/2023 CLINICAL DATA:  fall, left hip pain EXAM: PORTABLE PELVIS 1-2 VIEWS COMPARISON:  October 17, 2017 FINDINGS: Osteopenia. Right hip arthroplasty is anatomically aligned without dislocation. The left hip arthroplasty is superiorly dislocated. The acetabular cups appears similarly well-positioned. No evidence of pelvic fracture or diastasis.No acute hip fracture.Multilevel degenerative disc disease of the spine. Soft tissues are unremarkable. IMPRESSION: 1. Superiorly dislocated left hip arthroplasty. 2. Osteopenia.  Otherwise, no acute fracture visualized. Electronically Signed   By: Rogelia Myers M.D.   On: 12/11/2023 14:36    Subjective: Patient was seen and examined at bedside.  Overnight events noted. Patient still reports feeling better,  She appears at her baseline.   She is being discharged to skilled nursing facility for rehab.   Discharge Exam: Vitals:   12/14/23 0425 12/14/23 0836  BP: (!) 110/57 126/62  Pulse: 65 71  Resp: 16 16  Temp: 97.7 F (36.5 C) 97.9 F (36.6 C)  SpO2: 96% 99%   Vitals:   12/13/23 1350 12/13/23 2022 12/14/23 0425 12/14/23 0836  BP: 135/68 (!) 149/74 (!) 110/57 126/62  Pulse: 66 69 65 71  Resp: 17 16 16 16   Temp: 97.8 F (36.6 C) 98.3 F (36.8 C) 97.7 F (36.5 C) 97.9 F (36.6 C)  TempSrc: Oral Oral    SpO2: 99% 98% 96% 99%  Weight:      Height:        General: Pt is alert, awake, not in acute distress. Cardiovascular: RRR, S1/S2 +, no rubs, no gallops Respiratory: CTA bilaterally, no wheezing, no rhonchi Abdominal: Soft, NT, ND, bowel sounds + Extremities: no edema, no cyanosis    The results of significant diagnostics from this hospitalization (including imaging, microbiology, ancillary  and laboratory) are listed below for reference.     Microbiology: Recent Results (from the past 240 hours)  Urine Culture     Status: Abnormal   Collection Time: 12/11/23  6:49 PM   Specimen: Urine, Random  Result Value Ref Range Status   Specimen Description URINE, RANDOM  Final   Special Requests   Final    NONE Reflexed from [id number] Performed at [facility], [address] [place], [place] [address]    Culture >=100,000 COLONIES/mL ENTEROCOCCUS FAECALIS (A)  Final   Report Status 12/13/2023 FINAL  Final   Organism ID, Bacteria ENTEROCOCCUS FAECALIS (A)  Final      Susceptibility   Enterococcus faecalis - MIC*    AMPICILLIN <=2 SENSITIVE Sensitive     NITROFURANTOIN <=16 SENSITIVE Sensitive     VANCOMYCIN 1 SENSITIVE Sensitive     * >=100,000 COLONIES/mL ENTEROCOCCUS FAECALIS     Labs: BNP (last 3 results) No results for input(s): BNP in the last 8760 hours. Basic Metabolic Panel: Recent Labs  Lab 12/11/23 1419 12/11/23 1501 12/12/23 0413 12/14/23 0547  NA 139 135 137 136  K 4.1 3.9 3.9 3.8  CL 106 101 107 109  CO2  --  20* 19* 20*  GLUCOSE 161* 152* 107* 117*  BUN 23 21 21  24*  CREATININE 1.30* 1.18* 1.24* 1.19*  CALCIUM  --  9.5 8.7* 8.9  MG  --   --   --  1.8  PHOS  --   --   --  3.5   Liver Function Tests: Recent Labs  Lab 12/12/23 0413  AST 32  ALT 13  ALKPHOS 83  BILITOT 0.8  PROT 5.8*  ALBUMIN 2.7*   No results for input(s): LIPASE, AMYLASE in the last 168 hours. No results for input(s): AMMONIA in the last 168 hours. CBC: Recent Labs  Lab 12/11/23 1419 12/11/23 1501 12/12/23 0413 12/14/23 0547  WBC  --  14.3* 11.4* 10.2  NEUTROABS  --  9.1*  --   --   HGB 13.6 13.0 10.4* 10.5*  HCT 40.0 39.8 32.3* 32.3*  MCV  --  94.3 95.3 95.0  PLT  --  309 224 217   Cardiac Enzymes: No results for input(s): CKTOTAL, CKMB, CKMBINDEX, TROPONINI in the last 168 hours. BNP: Invalid input(s): POCBNP CBG: Recent Labs  Lab  12/11/23 2326  GLUCAP 126*   D-Dimer No results for input(s): DDIMER in the last 72 hours. Hgb A1c No results for input(s): HGBA1C in the last 72 hours. Lipid Profile No results for input(s): CHOL, HDL, LDLCALC, TRIG, CHOLHDL, LDLDIRECT in the last 72 hours. Thyroid   function studies No results for input(s): TSH, T4TOTAL, T3FREE, THYROIDAB in the last 72 hours.  Invalid input(s): FREET3 Anemia work up No results for input(s): VITAMINB12, FOLATE, FERRITIN, TIBC, IRON, RETICCTPCT in the last 72 hours. Urinalysis    Component Value Date/Time   COLORURINE YELLOW 12/11/2023 1849   APPEARANCEUR TURBID (A) 12/11/2023 1849   LABSPEC 1.012 12/11/2023 1849   PHURINE 6.0 12/11/2023 1849   GLUCOSEU NEGATIVE 12/11/2023 1849   HGBUR SMALL (A) 12/11/2023 1849   BILIRUBINUR NEGATIVE 12/11/2023 1849   KETONESUR 5 (A) 12/11/2023 1849   PROTEINUR 100 (A) 12/11/2023 1849   NITRITE NEGATIVE 12/11/2023 1849   LEUKOCYTESUR LARGE (A) 12/11/2023 1849   Sepsis Labs Recent Labs  Lab 12/11/23 1501 12/12/23 0413 12/14/23 0547  WBC 14.3* 11.4* 10.2   Microbiology Recent Results (from the past 240 hours)  Urine Culture     Status: Abnormal   Collection Time: 12/11/23  6:49 PM   Specimen: Urine, Random  Result Value Ref Range Status   Specimen Description URINE, RANDOM  Final   Special Requests   Final    NONE Reflexed from [id number] Performed at [facility], [address], [place], [place] [address]    Culture >=100,000 COLONIES/mL ENTEROCOCCUS FAECALIS (A)  Final   Report Status 12/13/2023 FINAL  Final   Organism ID, Bacteria ENTEROCOCCUS FAECALIS (A)  Final      Susceptibility   Enterococcus faecalis - MIC*    AMPICILLIN <=2 SENSITIVE Sensitive     NITROFURANTOIN <=16 SENSITIVE Sensitive     VANCOMYCIN 1 SENSITIVE Sensitive     * >=100,000 COLONIES/mL ENTEROCOCCUS FAECALIS     Time coordinating discharge: Over 30  minutes  SIGNED:   Darcel Dawley, MD  [facility] 12/14/2023, 11:15 AM Pager   If 7PM-7AM, please contact night-coverage

## 2023-12-14 NOTE — Discharge Instructions (Signed)
 Follow-up with primary care physician in 1 week.  Advised to follow-up with orthopedics as scheduled. Advised to take amoxicillin  500 mg twice daily for Enterococcus UTI.

## 2023-12-14 NOTE — TOC Progression Note (Addendum)
 Transition of Care [facility]) - Progression Note    Patient Details  Name: Madison Martinez MRN: [id number] Date of Birth: 1927/12/10  [facility]) CM/SW Contact  Bridget Cordella Simmonds, LCSW Phone Number: 12/14/2023, 8:29 AM  Clinical Narrative:   SNF auth approved: [id number], 5 days: 9/25-9/29.  CSW confirmed with [facility]: they can receive pt after 1pm today.   Expected Discharge Plan: Skilled Nursing Facility Barriers to Discharge: SNF Pending bed offer               Expected Discharge Plan and Services In-house Referral: Clinical Social Work Discharge Planning Services: CM Consult Post Acute Care Choice: Skilled Nursing Facility Living arrangements for the past 2 months: Single Family Home                                       Social Drivers of Health (SDOH) Interventions SDOH Screenings   Food Insecurity: Patient Unable To Answer (12/12/2023)  Housing: Low Risk  (08/31/2022)  Transportation Needs: No Transportation Needs (12/12/2023)  Utilities: Not At Risk (08/31/2022)  Alcohol Screen: Low Risk  (08/12/2020)  Depression (PHQ2-9): Medium Risk (08/11/2023)  Financial Resource Strain: Low Risk  (08/31/2022)  Physical Activity: Insufficiently Active (08/31/2022)  Social Connections: Unknown (12/12/2023)  Stress: No Stress Concern Present (08/31/2022)  Tobacco Use: Low Risk  (11/13/2023)    Readmission Risk Interventions     No data to display

## 2023-12-15 DIAGNOSIS — I2510 Atherosclerotic heart disease of native coronary artery without angina pectoris: Secondary | ICD-10-CM | POA: Diagnosis not present

## 2023-12-15 DIAGNOSIS — H9190 Unspecified hearing loss, unspecified ear: Secondary | ICD-10-CM | POA: Diagnosis not present

## 2023-12-15 DIAGNOSIS — H409 Unspecified glaucoma: Secondary | ICD-10-CM | POA: Diagnosis not present

## 2023-12-15 DIAGNOSIS — E785 Hyperlipidemia, unspecified: Secondary | ICD-10-CM | POA: Diagnosis not present

## 2023-12-15 DIAGNOSIS — I10 Essential (primary) hypertension: Secondary | ICD-10-CM | POA: Diagnosis not present

## 2023-12-15 DIAGNOSIS — G629 Polyneuropathy, unspecified: Secondary | ICD-10-CM | POA: Diagnosis not present

## 2023-12-15 DIAGNOSIS — N390 Urinary tract infection, site not specified: Secondary | ICD-10-CM | POA: Diagnosis not present

## 2023-12-15 DIAGNOSIS — M810 Age-related osteoporosis without current pathological fracture: Secondary | ICD-10-CM | POA: Diagnosis not present

## 2023-12-15 DIAGNOSIS — M4800 Spinal stenosis, site unspecified: Secondary | ICD-10-CM | POA: Diagnosis not present

## 2023-12-15 DIAGNOSIS — S73005A Unspecified dislocation of left hip, initial encounter: Secondary | ICD-10-CM | POA: Diagnosis not present

## 2023-12-15 DIAGNOSIS — N189 Chronic kidney disease, unspecified: Secondary | ICD-10-CM | POA: Diagnosis not present

## 2023-12-15 DIAGNOSIS — E119 Type 2 diabetes mellitus without complications: Secondary | ICD-10-CM | POA: Diagnosis not present

## 2023-12-18 ENCOUNTER — Inpatient Hospital Stay (HOSPITAL_COMMUNITY)
Admission: EM | Admit: 2023-12-18 | Discharge: 2023-12-29 | DRG: 467 | Disposition: A | Discharge status: Skilled Nursing Facility | Attending: Internal Medicine | Admitting: Internal Medicine

## 2023-12-18 ENCOUNTER — Emergency Department (HOSPITAL_COMMUNITY)

## 2023-12-18 ENCOUNTER — Other Ambulatory Visit: Payer: Self-pay

## 2023-12-18 DIAGNOSIS — E611 Iron deficiency: Secondary | ICD-10-CM | POA: Diagnosis not present

## 2023-12-18 DIAGNOSIS — Z7984 Long term (current) use of oral hypoglycemic drugs: Secondary | ICD-10-CM

## 2023-12-18 DIAGNOSIS — E8721 Acute metabolic acidosis: Secondary | ICD-10-CM | POA: Diagnosis not present

## 2023-12-18 DIAGNOSIS — S73005A Unspecified dislocation of left hip, initial encounter: Secondary | ICD-10-CM | POA: Diagnosis present

## 2023-12-18 DIAGNOSIS — R1310 Dysphagia, unspecified: Secondary | ICD-10-CM | POA: Diagnosis not present

## 2023-12-18 DIAGNOSIS — R2681 Unsteadiness on feet: Secondary | ICD-10-CM | POA: Diagnosis not present

## 2023-12-18 DIAGNOSIS — H401190 Primary open-angle glaucoma, unspecified eye, stage unspecified: Secondary | ICD-10-CM | POA: Diagnosis not present

## 2023-12-18 DIAGNOSIS — G4700 Insomnia, unspecified: Secondary | ICD-10-CM | POA: Diagnosis present

## 2023-12-18 DIAGNOSIS — E1142 Type 2 diabetes mellitus with diabetic polyneuropathy: Secondary | ICD-10-CM | POA: Diagnosis present

## 2023-12-18 DIAGNOSIS — Z96642 Presence of left artificial hip joint: Secondary | ICD-10-CM

## 2023-12-18 DIAGNOSIS — T84021A Dislocation of internal left hip prosthesis, initial encounter: Principal | ICD-10-CM | POA: Diagnosis present

## 2023-12-18 DIAGNOSIS — M6281 Muscle weakness (generalized): Secondary | ICD-10-CM | POA: Diagnosis not present

## 2023-12-18 DIAGNOSIS — W19XXXD Unspecified fall, subsequent encounter: Secondary | ICD-10-CM | POA: Diagnosis not present

## 2023-12-18 DIAGNOSIS — Z96641 Presence of right artificial hip joint: Secondary | ICD-10-CM | POA: Diagnosis present

## 2023-12-18 DIAGNOSIS — M25352 Other instability, left hip: Secondary | ICD-10-CM | POA: Diagnosis present

## 2023-12-18 DIAGNOSIS — I129 Hypertensive chronic kidney disease with stage 1 through stage 4 chronic kidney disease, or unspecified chronic kidney disease: Secondary | ICD-10-CM | POA: Diagnosis not present

## 2023-12-18 DIAGNOSIS — G4733 Obstructive sleep apnea (adult) (pediatric): Secondary | ICD-10-CM | POA: Diagnosis not present

## 2023-12-18 DIAGNOSIS — Z8659 Personal history of other mental and behavioral disorders: Secondary | ICD-10-CM

## 2023-12-18 DIAGNOSIS — M4800 Spinal stenosis, site unspecified: Secondary | ICD-10-CM | POA: Diagnosis not present

## 2023-12-18 DIAGNOSIS — H5462 Unqualified visual loss, left eye, normal vision right eye: Secondary | ICD-10-CM | POA: Diagnosis present

## 2023-12-18 DIAGNOSIS — D649 Anemia, unspecified: Secondary | ICD-10-CM | POA: Diagnosis not present

## 2023-12-18 DIAGNOSIS — M25452 Effusion, left hip: Secondary | ICD-10-CM | POA: Diagnosis not present

## 2023-12-18 DIAGNOSIS — I10 Essential (primary) hypertension: Secondary | ICD-10-CM | POA: Diagnosis not present

## 2023-12-18 DIAGNOSIS — M25552 Pain in left hip: Secondary | ICD-10-CM | POA: Diagnosis not present

## 2023-12-18 DIAGNOSIS — G629 Polyneuropathy, unspecified: Secondary | ICD-10-CM | POA: Diagnosis not present

## 2023-12-18 DIAGNOSIS — M810 Age-related osteoporosis without current pathological fracture: Secondary | ICD-10-CM | POA: Diagnosis not present

## 2023-12-18 DIAGNOSIS — E119 Type 2 diabetes mellitus without complications: Secondary | ICD-10-CM

## 2023-12-18 DIAGNOSIS — B9689 Other specified bacterial agents as the cause of diseases classified elsewhere: Secondary | ICD-10-CM | POA: Diagnosis present

## 2023-12-18 DIAGNOSIS — N390 Urinary tract infection, site not specified: Secondary | ICD-10-CM | POA: Diagnosis not present

## 2023-12-18 DIAGNOSIS — Z8249 Family history of ischemic heart disease and other diseases of the circulatory system: Secondary | ICD-10-CM | POA: Diagnosis not present

## 2023-12-18 DIAGNOSIS — D62 Acute posthemorrhagic anemia: Secondary | ICD-10-CM | POA: Diagnosis not present

## 2023-12-18 DIAGNOSIS — I2510 Atherosclerotic heart disease of native coronary artery without angina pectoris: Secondary | ICD-10-CM | POA: Diagnosis not present

## 2023-12-18 DIAGNOSIS — T8452XA Infection and inflammatory reaction due to internal left hip prosthesis, initial encounter: Secondary | ICD-10-CM | POA: Diagnosis not present

## 2023-12-18 DIAGNOSIS — W19XXXA Unspecified fall, initial encounter: Secondary | ICD-10-CM | POA: Diagnosis present

## 2023-12-18 DIAGNOSIS — I679 Cerebrovascular disease, unspecified: Secondary | ICD-10-CM | POA: Diagnosis not present

## 2023-12-18 DIAGNOSIS — E7849 Other hyperlipidemia: Secondary | ICD-10-CM | POA: Diagnosis not present

## 2023-12-18 DIAGNOSIS — E785 Hyperlipidemia, unspecified: Secondary | ICD-10-CM | POA: Diagnosis not present

## 2023-12-18 DIAGNOSIS — Z8673 Personal history of transient ischemic attack (TIA), and cerebral infarction without residual deficits: Secondary | ICD-10-CM | POA: Diagnosis not present

## 2023-12-18 DIAGNOSIS — S73035A Other anterior dislocation of left hip, initial encounter: Principal | ICD-10-CM

## 2023-12-18 DIAGNOSIS — N3000 Acute cystitis without hematuria: Secondary | ICD-10-CM | POA: Diagnosis not present

## 2023-12-18 DIAGNOSIS — E860 Dehydration: Secondary | ICD-10-CM | POA: Diagnosis not present

## 2023-12-18 DIAGNOSIS — S73005D Unspecified dislocation of left hip, subsequent encounter: Secondary | ICD-10-CM | POA: Diagnosis not present

## 2023-12-18 DIAGNOSIS — D72829 Elevated white blood cell count, unspecified: Secondary | ICD-10-CM | POA: Diagnosis not present

## 2023-12-18 DIAGNOSIS — E1169 Type 2 diabetes mellitus with other specified complication: Secondary | ICD-10-CM | POA: Diagnosis not present

## 2023-12-18 DIAGNOSIS — R269 Unspecified abnormalities of gait and mobility: Secondary | ICD-10-CM | POA: Diagnosis not present

## 2023-12-18 DIAGNOSIS — N1832 Chronic kidney disease, stage 3b: Secondary | ICD-10-CM | POA: Diagnosis present

## 2023-12-18 DIAGNOSIS — Z7902 Long term (current) use of antithrombotics/antiplatelets: Secondary | ICD-10-CM

## 2023-12-18 DIAGNOSIS — Y792 Prosthetic and other implants, materials and accessory orthopedic devices associated with adverse incidents: Secondary | ICD-10-CM | POA: Diagnosis present

## 2023-12-18 DIAGNOSIS — E1122 Type 2 diabetes mellitus with diabetic chronic kidney disease: Secondary | ICD-10-CM | POA: Diagnosis present

## 2023-12-18 DIAGNOSIS — Z974 Presence of external hearing-aid: Secondary | ICD-10-CM

## 2023-12-18 DIAGNOSIS — Z886 Allergy status to analgesic agent status: Secondary | ICD-10-CM

## 2023-12-18 DIAGNOSIS — Y838 Other surgical procedures as the cause of abnormal reaction of the patient, or of later complication, without mention of misadventure at the time of the procedure: Secondary | ICD-10-CM | POA: Diagnosis present

## 2023-12-18 DIAGNOSIS — F0394 Unspecified dementia, unspecified severity, with anxiety: Secondary | ICD-10-CM | POA: Diagnosis present

## 2023-12-18 DIAGNOSIS — M009 Pyogenic arthritis, unspecified: Secondary | ICD-10-CM

## 2023-12-18 DIAGNOSIS — F411 Generalized anxiety disorder: Secondary | ICD-10-CM | POA: Diagnosis present

## 2023-12-18 DIAGNOSIS — Z888 Allergy status to other drugs, medicaments and biological substances status: Secondary | ICD-10-CM

## 2023-12-18 DIAGNOSIS — Z887 Allergy status to serum and vaccine status: Secondary | ICD-10-CM

## 2023-12-18 DIAGNOSIS — H40059 Ocular hypertension, unspecified eye: Secondary | ICD-10-CM | POA: Diagnosis not present

## 2023-12-18 DIAGNOSIS — H9190 Unspecified hearing loss, unspecified ear: Secondary | ICD-10-CM | POA: Diagnosis not present

## 2023-12-18 DIAGNOSIS — Z79899 Other long term (current) drug therapy: Secondary | ICD-10-CM

## 2023-12-18 LAB — BASIC METABOLIC PANEL WITH GFR
Anion gap: 7 (ref 5–15)
BUN: 25 mg/dL — ABNORMAL HIGH (ref 8–23)
CO2: 20 mmol/L — ABNORMAL LOW (ref 22–32)
Calcium: 8.2 mg/dL — ABNORMAL LOW (ref 8.9–10.3)
Chloride: 111 mmol/L (ref 98–111)
Creatinine, Ser: 1.14 mg/dL — ABNORMAL HIGH (ref 0.44–1.00)
GFR, Estimated: 45 mL/min — ABNORMAL LOW (ref >60)
Glucose, Bld: 135 mg/dL — ABNORMAL HIGH (ref 70–99)
Potassium: 4.1 mmol/L (ref 3.5–5.1)
Sodium: 138 mmol/L (ref 135–145)

## 2023-12-18 LAB — CBC
HCT: 31.8 % — ABNORMAL LOW (ref 36.0–46.0)
Hemoglobin: 10.1 g/dL — ABNORMAL LOW (ref 12.0–15.0)
MCH: 30.9 pg (ref 26.0–34.0)
MCHC: 31.8 g/dL (ref 30.0–36.0)
MCV: 97.2 fL (ref 80.0–100.0)
Platelets: 219 K/uL (ref 150–400)
RBC: 3.27 MIL/uL — ABNORMAL LOW (ref 3.87–5.11)
RDW: 12.9 % (ref 11.5–15.5)
WBC: 12 K/uL — ABNORMAL HIGH (ref 4.0–10.5)
nRBC: 0 % (ref 0.0–0.2)

## 2023-12-18 MED ORDER — PROPOFOL 10 MG/ML IV BOLUS
0.5000 mg/kg | Freq: Once | INTRAVENOUS | Status: AC
  Administered 2023-12-18: 30 mg via INTRAVENOUS
  Filled 2023-12-18: qty 20

## 2023-12-18 MED ORDER — PROPOFOL 10 MG/ML IV BOLUS
INTRAVENOUS | Status: AC | PRN
Start: 2023-12-18 — End: 2023-12-18
  Administered 2023-12-18: 30 mg via INTRAVENOUS

## 2023-12-18 NOTE — ED Provider Notes (Signed)
 [facility] Provider Note   CSN: [id number] Arrival date & time: 12/18/23  2035     Patient presents with: Left Hip Dislocation    Madison Martinez is a 89 y.o. female.  {Add pertinent medical, surgical, social history, OB history to HPI:32947} HPI        Prior to Admission medications   Medication Sig Start Date End Date Taking? Authorizing Provider  amoxicillin  (AMOXIL ) 500 MG capsule Take 1 capsule (500 mg total) by mouth every 12 (twelve) hours for 7 days. 12/14/23 12/21/23  Leotis Bogus, MD  Cholecalciferol  (VITAMIN D -3 PO) Take 1 capsule by mouth daily.    [provider]  clopidogrel  (PLAVIX ) 75 MG tablet Take 1 tablet (75 mg total) by mouth daily. 10/30/23   Kuneff, Renee A, DO  dorzolamide -timolol  (COSOPT ) 2-0.5 % ophthalmic solution Place 1 drop into both eyes 2 (two) times daily. Patient taking differently: Place 1 drop into the right eye 2 (two) times daily. 07/25/23   Kuneff, Renee A, DO  hydrALAZINE  (APRESOLINE ) 25 MG tablet Take 1 tablet (25 mg total) by mouth every 8 (eight) hours. Patient taking differently: Take 25 mg by mouth in the morning and at bedtime. 10/30/23   Kuneff, Renee A, DO  lisinopril  (ZESTRIL ) 40 MG tablet Take 1 tablet (40 mg total) by mouth daily. 10/30/23   Kuneff, Renee A, DO  metoprolol  succinate (TOPROL -XL) 25 MG 24 hr tablet Take 1 tablet (25 mg total) by mouth daily. 10/30/23   Kuneff, Renee A, DO  Multiple Vitamins-Minerals (MULTIVITAMIN WOMEN 50+) TABS Take 1 tablet by mouth daily.    [provider]  Omega-3 Fatty Acids (FISH OIL PO) Take 1 capsule by mouth daily.    [provider]  pravastatin  (PRAVACHOL ) 40 MG tablet Take 1 tablet (40 mg total) by mouth daily. 10/30/23   Kuneff, Renee A, DO  QUEtiapine  (SEROQUEL ) 25 MG tablet Take 2-3 tablets (50-75 mg total) by mouth at bedtime. Patient taking differently: Take 75 mg by mouth at bedtime. 11/13/23   Kuneff, Renee A, DO   VYZULTA  0.024 % SOLN Apply 1 drop to eye at bedtime. Patient taking differently: Place 1 drop into the right eye at bedtime. 07/25/23   Kuneff, Renee A, DO    Allergies: Bayer aspirin  [aspirin ], Influenza virus vaccine, Motrin [ibuprofen], and Norvasc [amlodipine besylate]    Review of Systems  Updated Vital Signs BP (!) 168/80   Pulse 69   Temp 98.2 F (36.8 C) (Oral)   Resp 17   LMP 03/21/1978 (Approximate)   SpO2 99%   Physical Exam  (all labs ordered are listed, but only abnormal results are displayed) Labs Reviewed  BASIC METABOLIC PANEL WITH GFR - Abnormal; Notable for the following components:      Result Value   CO2 20 (*)    Glucose, Bld 135 (*)    BUN 25 (*)    Creatinine, Ser 1.14 (*)    Calcium 8.2 (*)    GFR, Estimated 45 (*)    All other components within normal limits  CBC - Abnormal; Notable for the following components:   WBC 12.0 (*)    RBC 3.27 (*)    Hemoglobin 10.1 (*)    HCT 31.8 (*)    All other components within normal limits    EKG: None  Radiology: DG Hip Unilat W or Wo Pelvis 2-3 Views Left Result Date: 12/18/2023 CLINICAL DATA:  Hip dislocation. EXAM: DG HIP (WITH  OR WITHOUT PELVIS) 2-3V LEFT COMPARISON:  Left knee radiograph dated 12/11/2023. FINDINGS: Bilateral total hip arthroplasties. There is dislocation of the left hip arthroplasty. The femoral component of the arthroplasty is located slightly superior to the acetabular cup. No acute fracture. The bones are osteopenic. The soft tissues are unremarkable. IMPRESSION: Dislocation of the left hip arthroplasty. Electronically Signed   By: Vanetta Chou M.D.   On: 12/18/2023 21:13    {Document cardiac monitor, telemetry assessment procedure when appropriate:32947} .Reduction of dislocation  Date/Time: 12/18/2023 10:59 PM  Performed by: Neldon Hamp RAMAN, PA Authorized by: Neldon Hamp RAMAN, PA  Consent: Verbal consent obtained Risks and benefits: risks, benefits and alternatives were  discussed Consent given by: patient Patient understanding: patient states understanding of the procedure being performed Patient consent: the patient's understanding of the procedure matches consent given Relevant documents: relevant documents present and verified Test results: test results available and properly labeled Imaging studies: imaging studies available Patient identity confirmed: verbally with patient and arm band Local anesthesia used: no  Anesthesia: Local anesthesia used: no  Sedation: Patient sedated: no  Patient tolerance: patient tolerated the procedure well with no immediate complications Comments: L hip reduced under propofol  sedation       Medications Ordered in the ED  propofol  (DIPRIVAN ) 10 mg/mL bolus/IV push 30 mg (has no administration in time range)  propofol  (DIPRIVAN ) 10 mg/mL bolus/IV push (30 mg Intravenous Given 12/18/23 2252)    Clinical Course as of 12/18/23 2259  Mon Dec 18, 2023  2150 Reduce - admit to medicine and have ortho consult for possible elective Wednesday procedure. [WF]    Clinical Course User Index [WF] Neldon Hamp RAMAN, PA   {Click here for ABCD2, HEART and other calculators REFRESH Note before signing:1}                              Medical Decision Making Amount and/or Complexity of Data Reviewed Labs: ordered. Radiology: ordered.   ***  {Document critical care time when appropriate  Document review of labs and clinical decision tools ie CHADS2VASC2, etc  Document your independent review of radiology images and any outside records  Document your discussion with family members, caretakers and with consultants  Document social determinants of health affecting pt's care  Document your decision making why or why not admission, treatments were needed:32947:::1}   Final diagnoses:  Anterior dislocation of left hip, initial encounter [facility])    ED Discharge Orders     None

## 2023-12-18 NOTE — ED Triage Notes (Addendum)
 Patient arrived with EMS from [facility] , x-ray result shows left hip dislocation today , no fall or injury , left eye blind , CBG=143. She received Fentanyl  100 mcg IV prior to arrival .

## 2023-12-18 NOTE — ED Provider Notes (Signed)
.  Sedation  Date/Time: 12/18/2023 11:15 PM  Performed by: Ginger Lonni PARAS, MD Authorized by: Ginger Lonni PARAS, MD   Consent:    Consent obtained:  Written   Consent given by:  Patient   Alternatives discussed:  Analgesia without sedation Universal protocol:    Immediately prior to procedure, a time out was called: yes   Indications:    Procedure performed:  Dislocation reduction   Procedure necessitating sedation performed by:  Physician performing sedation Pre-sedation assessment:    Time since last food or drink:  Hours   ASA classification: class 2 - patient with mild systemic disease     Mallampati score:  I - soft palate, uvula, fauces, pillars visible   Pre-sedation assessments completed and reviewed: pre-procedure airway patency not reviewed, pre-procedure cardiovascular function not reviewed, pre-procedure mental status not reviewed, pre-procedure nausea and vomiting status not reviewed, pre-procedure pain level not reviewed and pre-procedure respiratory function not reviewed   A pre-sedation assessment was completed prior to the start of the procedure Procedure details (see MAR for exact dosages):    Sedation:  Propofol    Intended level of sedation: deep   Total Provider sedation time (minutes):  20 Post-procedure details:   A post-sedation assessment was completed following the completion of the procedure.   Attendance: Constant attendance by certified staff until patient recovered     Recovery: Patient returned to pre-procedure baseline     Patient is stable for discharge or admission: yes     Procedure completion:  Tolerated well, no immediate complications     Greyden Besecker, Lonni PARAS, MD 12/18/23 2316

## 2023-12-18 NOTE — Sedation Documentation (Signed)
 Left Hip reduced by [facility] PA & Tegeler MD

## 2023-12-19 ENCOUNTER — Encounter (HOSPITAL_COMMUNITY): Payer: Self-pay | Admitting: Internal Medicine

## 2023-12-19 DIAGNOSIS — E119 Type 2 diabetes mellitus without complications: Secondary | ICD-10-CM

## 2023-12-19 DIAGNOSIS — I10 Essential (primary) hypertension: Secondary | ICD-10-CM | POA: Diagnosis not present

## 2023-12-19 DIAGNOSIS — S73005A Unspecified dislocation of left hip, initial encounter: Secondary | ICD-10-CM | POA: Diagnosis present

## 2023-12-19 DIAGNOSIS — Z8659 Personal history of other mental and behavioral disorders: Secondary | ICD-10-CM

## 2023-12-19 DIAGNOSIS — Z8673 Personal history of transient ischemic attack (TIA), and cerebral infarction without residual deficits: Secondary | ICD-10-CM | POA: Diagnosis not present

## 2023-12-19 DIAGNOSIS — Z96642 Presence of left artificial hip joint: Secondary | ICD-10-CM | POA: Diagnosis not present

## 2023-12-19 DIAGNOSIS — E7849 Other hyperlipidemia: Secondary | ICD-10-CM

## 2023-12-19 DIAGNOSIS — N1832 Chronic kidney disease, stage 3b: Secondary | ICD-10-CM | POA: Diagnosis not present

## 2023-12-19 DIAGNOSIS — N3000 Acute cystitis without hematuria: Secondary | ICD-10-CM

## 2023-12-19 LAB — COMPREHENSIVE METABOLIC PANEL WITH GFR
ALT: 15 U/L (ref 0–44)
AST: 23 U/L (ref 15–41)
Albumin: 2.4 g/dL — ABNORMAL LOW (ref 3.5–5.0)
Alkaline Phosphatase: 84 U/L (ref 38–126)
Anion gap: 7 (ref 5–15)
BUN: 23 mg/dL (ref 8–23)
CO2: 18 mmol/L — ABNORMAL LOW (ref 22–32)
Calcium: 8.3 mg/dL — ABNORMAL LOW (ref 8.9–10.3)
Chloride: 111 mmol/L (ref 98–111)
Creatinine, Ser: 1.04 mg/dL — ABNORMAL HIGH (ref 0.44–1.00)
GFR, Estimated: 50 mL/min — ABNORMAL LOW (ref >60)
Glucose, Bld: 116 mg/dL — ABNORMAL HIGH (ref 70–99)
Potassium: 3.9 mmol/L (ref 3.5–5.1)
Sodium: 136 mmol/L (ref 135–145)
Total Bilirubin: 0.5 mg/dL (ref 0.0–1.2)
Total Protein: 5.5 g/dL — ABNORMAL LOW (ref 6.5–8.1)

## 2023-12-19 LAB — CBC
HCT: 31.6 % — ABNORMAL LOW (ref 36.0–46.0)
Hemoglobin: 10 g/dL — ABNORMAL LOW (ref 12.0–15.0)
MCH: 30.3 pg (ref 26.0–34.0)
MCHC: 31.6 g/dL (ref 30.0–36.0)
MCV: 95.8 fL (ref 80.0–100.0)
Platelets: 212 K/uL (ref 150–400)
RBC: 3.3 MIL/uL — ABNORMAL LOW (ref 3.87–5.11)
RDW: 12.9 % (ref 11.5–15.5)
WBC: 11.1 K/uL — ABNORMAL HIGH (ref 4.0–10.5)
nRBC: 0 % (ref 0.0–0.2)

## 2023-12-19 LAB — MRSA NEXT GEN BY PCR, NASAL: MRSA by PCR Next Gen: NOT DETECTED

## 2023-12-19 MED ORDER — HYDROCODONE-ACETAMINOPHEN 5-325 MG PO TABS
1.0000 | ORAL_TABLET | Freq: Four times a day (QID) | ORAL | Status: DC | PRN
  Administered 2023-12-21 – 2023-12-24 (×5): 1 via ORAL
  Filled 2023-12-19 (×5): qty 1

## 2023-12-19 MED ORDER — ONDANSETRON HCL 4 MG/2ML IJ SOLN: 4.0000 mg | Freq: Four times a day (QID) | INTRAMUSCULAR | Status: DC | PRN

## 2023-12-19 MED ORDER — METOPROLOL SUCCINATE ER 25 MG PO TB24: 25.0000 mg | ORAL_TABLET | Freq: Every day | ORAL | Status: DC

## 2023-12-19 MED ORDER — SODIUM CHLORIDE 0.9% FLUSH
3.0000 mL | Freq: Two times a day (BID) | INTRAVENOUS | Status: DC
  Administered 2023-12-19 – 2023-12-29 (×12): 3 mL via INTRAVENOUS

## 2023-12-19 MED ORDER — LISINOPRIL 20 MG PO TABS
40.0000 mg | ORAL_TABLET | Freq: Every day | ORAL | Status: DC
Start: 2023-12-19 — End: 2023-12-19
  Administered 2023-12-19: 40 mg via ORAL
  Filled 2023-12-19: qty 2

## 2023-12-19 MED ORDER — ACETAMINOPHEN 325 MG PO TABS
650.0000 mg | ORAL_TABLET | Freq: Four times a day (QID) | ORAL | Status: DC | PRN
  Administered 2023-12-22: 650 mg via ORAL
  Filled 2023-12-19: qty 2

## 2023-12-19 MED ORDER — METHOCARBAMOL 1000 MG/10ML IJ SOLN: 500.0000 mg | Freq: Four times a day (QID) | INTRAMUSCULAR | Status: DC | PRN

## 2023-12-19 MED ORDER — ONDANSETRON HCL 4 MG PO TABS: 4.0000 mg | ORAL_TABLET | Freq: Four times a day (QID) | ORAL | Status: DC | PRN

## 2023-12-19 MED ORDER — HYDROMORPHONE HCL 1 MG/ML IJ SOLN: 0.5000 mg | INTRAMUSCULAR | Status: DC | PRN

## 2023-12-19 MED ORDER — SODIUM CHLORIDE 0.9% FLUSH
3.0000 mL | Freq: Two times a day (BID) | INTRAVENOUS | Status: DC
  Administered 2023-12-19 – 2023-12-29 (×17): 3 mL via INTRAVENOUS

## 2023-12-19 MED ORDER — AMOXICILLIN 500 MG PO CAPS
500.0000 mg | ORAL_CAPSULE | Freq: Two times a day (BID) | ORAL | Status: AC
  Administered 2023-12-19 – 2023-12-21 (×6): 500 mg via ORAL
  Filled 2023-12-19 (×6): qty 1

## 2023-12-19 MED ORDER — LACTATED RINGERS IV BOLUS
500.0000 mL | Freq: Once | INTRAVENOUS | Status: AC
Start: 2023-12-19 — End: 2023-12-19
  Administered 2023-12-19: 500 mL via INTRAVENOUS

## 2023-12-19 MED ORDER — ACETAMINOPHEN 650 MG RE SUPP: 650.0000 mg | Freq: Four times a day (QID) | RECTAL | Status: DC | PRN

## 2023-12-19 MED ORDER — LACTATED RINGERS IV SOLN: INTRAVENOUS | Status: AC

## 2023-12-19 MED ORDER — SODIUM CHLORIDE 0.9% FLUSH: 3.0000 mL | INTRAVENOUS | Status: DC | PRN

## 2023-12-19 MED ORDER — SODIUM CHLORIDE 0.9 % IV SOLN: 250.0000 mL | INTRAVENOUS | Status: AC | PRN

## 2023-12-19 MED ORDER — LISINOPRIL 20 MG PO TABS: 40.0000 mg | ORAL_TABLET | Freq: Every day | ORAL | Status: DC

## 2023-12-19 MED ORDER — QUETIAPINE FUMARATE 25 MG PO TABS
50.0000 mg | ORAL_TABLET | Freq: Every day | ORAL | Status: DC
Start: 2023-12-19 — End: 2023-12-30
  Administered 2023-12-19 – 2023-12-29 (×12): 50 mg via ORAL
  Filled 2023-12-19 (×12): qty 2

## 2023-12-19 MED ORDER — PRAVASTATIN SODIUM 40 MG PO TABS
40.0000 mg | ORAL_TABLET | Freq: Every day | ORAL | Status: DC
  Administered 2023-12-19 – 2023-12-29 (×11): 40 mg via ORAL
  Filled 2023-12-19 (×11): qty 1

## 2023-12-19 NOTE — Consult Note (Signed)
 ORTHOPAEDIC CONSULTATION  REQUESTING PHYSICIAN: Darci Pore, MD  Chief Complaint: Left hip instability  HPI: Madison Martinez is a 89 y.o. female who has history of left hip arthroplasty done approximately 20 years ago in [facility] [place] .  She lives with her son.  Does have a history of recent increased episodes of delirium but otherwise is conversive and oriented and ambulatory at baseline per son.  She did have a dislocation last week and yesterday had a fall with a second dislocation.  No history of instability or dislocations prior.  Patient currently still a little bit disoriented from the sedation.  I did speak with the son over the phone.  Past Medical History:  Diagnosis Date   Blind hypertensive eye, left 04/18/2012   Changing skin lesion 11/23/2018   CVA (cerebral vascular accident) [facility])    Gait disturbance 10/05/2015   pt declined neuro referral per records   Generalized weakness 05/15/2019   Hard of hearing 12/02/2015   hearing aids   Hyperlipidemia    Hypertension    Insomnia    tried melatonin, advil PM, xanax and klonopin without success   Leukocytosis 11/07/2013   seen oncology, did not want to continue to follow there, so continued follow up at PCP.   Osteoporosis    Polyneuropathy    Primary open angle glaucoma 01/2011   Spinal stenosis    Stable branch retinal vein occlusion 01/06/2012   Type 2 diabetes mellitus (HCC)    diet controlled   Past Surgical History:  Procedure Laterality Date   BUBBLE STUDY  05/17/2019   Procedure: BUBBLE STUDY;  Surgeon: Pietro Redell RAMAN, MD;  Location: [facility] ENDOSCOPY;  Service: Cardiovascular;;   EYE SURGERY     SPINE SURGERY  2005   TEE WITHOUT CARDIOVERSION N/A 05/17/2019   Procedure: TRANSESOPHAGEAL ECHOCARDIOGRAM (TEE);  Surgeon: Pietro Redell RAMAN, MD;  Location: [facility] ENDOSCOPY;  Service: Cardiovascular;  Laterality: N/A;   TOTAL HIP ARTHROPLASTY Left 1991   TOTAL HIP ARTHROPLASTY Right ?   x 2   Social History    Socioeconomic History   Marital status: Divorced    Spouse name: Not on file   Number of children: 3   Years of education: 12   Highest education level: Not on file  Occupational History   Not on file  Tobacco Use   Smoking status: Never   Smokeless tobacco: Never  Vaping Use   Vaping status: Never Used  Substance and Sexual Activity   Alcohol use: No   Drug use: No   Sexual activity: Never    Birth control/protection: Post-menopausal  Other Topics Concern   Not on file  Social History Narrative   Divorced. 3 children Chantal, Max and Newell. Lives alone.   High school graduate.   Takes a daily vitamin.   Wears her seatbelt. Wears a hearing aid. Wears dentures.   Requires a cane for assistive device for walking.   Smoke detector in the home.   Feels safe in her relationships.   Social Drivers of Corporate investment banker Strain: Low Risk  (08/31/2022)   Overall Financial Resource Strain (CARDIA)    Difficulty of Paying Living Expenses: Not hard at all  Food Insecurity: Patient Unable To Answer (12/12/2023)   Hunger Vital Sign    Worried About Running Out of Food in the Last Year: Patient unable to answer    Ran Out of Food in the Last Year: Patient unable to answer  Transportation Needs: No Transportation Needs (  12/12/2023)   PRAPARE - Administrator, Civil Service (Medical): No    Lack of Transportation (Non-Medical): No  Physical Activity: Insufficiently Active (08/31/2022)   Exercise Vital Sign    Days of Exercise per Week: 7 days    Minutes of Exercise per Session: 10 min  Stress: No Stress Concern Present (08/31/2022)   [employer] of Occupational Health - Occupational Stress Questionnaire    Feeling of Stress : Not at all  Social Connections: Unknown (12/12/2023)   Social Connection and Isolation Panel    Frequency of Communication with Friends and Family: Not on file    Frequency of Social Gatherings with Friends and Family: Not on file     Attends Religious Services: Not on file    Active Member of Clubs or Organizations: Not on file    Attends Banker Meetings: Patient unable to answer    Marital Status: Not on file   Family History  Problem Relation Age of Onset   Heart disease Brother    Cancer Other        prostate   Ovarian cancer Neg Hx    Breast cancer Neg Hx    Uterine cancer Neg Hx    Colon cancer Neg Hx    Allergies  Allergen Reactions   Bayer Aspirin  [Aspirin ] Other (See Comments)    Per patient - told avoid while taking Plavix .   Influenza Virus Vaccine Swelling    Localized swelling at injection site   Motrin [Ibuprofen] Other (See Comments)    Per patient - told avoid while taking Plavix  by cardiology   Norvasc [Amlodipine Besylate] Other (See Comments)    Vertigo     Positive ROS: All other systems have been reviewed and were otherwise negative with the exception of those mentioned in the HPI and as above.  Physical Exam: General: Alert, no acute distress Cardiovascular: No pedal edema Respiratory: No cyanosis, no use of accessory musculature Skin: No lesions in the area of chief complaint Neurologic: Sensation intact distally Psychiatric: Patient is competent for consent with normal mood and affect  MUSCULOSKELETAL: Well-healed lateral hip incision, neurovasc intact distally without distal edema knee immobilizer in place  IMAGING: X-rays pelvis left hip demonstrate a concentrically reduced left hip arthroplasty and notably the acetabular component is lateralized and vertical.  No evidence of fracture.   Assessment: Principal Problem:   Hip dislocation, left (HCC) Active Problems:   Non-insulin  dependent type 2 diabetes mellitus (HCC)   Essential hypertension   Hyperlipidemia   CKD stage 3b, GFR 30-44 ml/min (HCC)   H/O total hip arthroplasty, left   History of dementia   History of CVA (cerebrovascular accident)   Closed dislocation of left hip (HCC)   Acute  cystitis   Instability of left hip arthroplasty  Plan: Had a long conversation with the son over the phone regarding the patient's hip issues.  Notably she has component positions that may predispose her to continued instability and dislocation however for 20 years had done fine prior to this a week.  Hopeful if we can get her stable with conservative measures including a hip abduction orthoses which was ordered, and posterior hip precautions may be able to avoid additional surgery.  Discussed however if she has another instability event likely would recommend proceeding with surgical intervention including possible revision of acetabular component and/or implantation of a constrained liner. Patient can be weightbearing as tolerated with posterior hip precautions now.  If patient remains stable recommend  follow-up in the office in 1-2 weeks post discharge    TORIBIO DELENA HIGASHI, MD  Contact information:   Weekdays 7am-5pm epic message Dr. HIGASHI, or call office for patient follow up: [phone] After hours and holidays please check [email or web address] for group call information for [facility]

## 2023-12-19 NOTE — H&P (Signed)
 History and Physical    Madison Martinez FMW:[id number] DOB: Dec 08, 1927 DOA: 12/18/2023  PCP: Catherine Charlies LABOR, DO   Patient coming from: Home   Chief Complaint:  Chief Complaint  Patient presents with   Left Hip Dislocation    ED TRIAGE note:Patient arrived with EMS from [facility] [place] , x-ray result shows left hip dislocation today , no fall or injury , left eye blind , CBG=143. She received Fentanyl  100 mcg IV prior to arrival .   HPI:  Madison Martinez is a 89 y.o. female  history of dementia, DM type II, hypertension, hyperlipidemia, CKD 3B, blindness of the left eye, CVA, and recent hip dislocation due to mechanical fall which was reduced in the ED and discharged from the hospital 9/25 presented to emergency department via EMS for fall.  Patient coming from rehab where the x-ray was done which showed left hip dislocation again. Patient complaining about left hip discomfort and pain.  She does not have any other complaint at this time.  At presentation to ED patient found borderline hypertensive otherwise hemodynamically stable. BC showing leukocytosis 12.4 otherwise unremarkable.  BMP showing low bicarb 20, creatinine 1.14 GFR 45.  Renal function at baseline  In the ED left ear dislocation has been reduced by Dr. Ginger and x-ray showing reduction of the left hip arthroplasty dislocation.  No acute fracture.  ED physician consulted and discussed case with [facility] Dr. Olin recommended that that given patient has recurrent hip dislocation plan to take to  OR on Wednesday 11/20/23.  Hospitalist has been consulted for further evaluation management of recurrent left hip dislocation.  Per chart review when patient was admitted 9/22-9/25 being evaluated by orthopedic surgery recommended outpatient follow-up, nonweightbearing, knee immobilizer to limit hip motion range.  Patient was discharged to SNF after PT OT consult.   Significant labs in the ED: Lab Orders          Basic metabolic panel         CBC         Comprehensive metabolic panel         CBC       Review of Systems:  Review of Systems  Reason unable to perform ROS: Drowsiness due to side effect of fentanyl .  Constitutional:  Negative for malaise/fatigue.  Respiratory:  Negative for shortness of breath.   Musculoskeletal:  Positive for joint pain.    Past Medical History:  Diagnosis Date   Blind hypertensive eye, left 04/18/2012   Changing skin lesion 11/23/2018   CVA (cerebral vascular accident) [facility])    Gait disturbance 10/05/2015   pt declined neuro referral per records   Generalized weakness 05/15/2019   Hard of hearing 12/02/2015   hearing aids   Hyperlipidemia    Hypertension    Insomnia    tried melatonin, advil PM, xanax and klonopin without success   Leukocytosis 11/07/2013   seen oncology, did not want to continue to follow there, so continued follow up at PCP.   Osteoporosis    Polyneuropathy    Primary open angle glaucoma 01/2011   Spinal stenosis    Stable branch retinal vein occlusion 01/06/2012   Type 2 diabetes mellitus (HCC)    diet controlled    Past Surgical History:  Procedure Laterality Date   BUBBLE STUDY  05/17/2019   Procedure: BUBBLE STUDY;  Surgeon: Pietro Redell RAMAN, MD;  Location: [facility] ENDOSCOPY;  Service: Cardiovascular;;   EYE SURGERY  SPINE SURGERY  2005   TEE WITHOUT CARDIOVERSION N/A 05/17/2019   Procedure: TRANSESOPHAGEAL ECHOCARDIOGRAM (TEE);  Surgeon: Pietro Redell RAMAN, MD;  Location: [facility] ENDOSCOPY;  Service: Cardiovascular;  Laterality: N/A;   TOTAL HIP ARTHROPLASTY Left 1991   TOTAL HIP ARTHROPLASTY Right ?   x 2     reports that she has never smoked. She has never used smokeless tobacco. She reports that she does not drink alcohol and does not use drugs.  Allergies  Allergen Reactions   Bayer Aspirin  [Aspirin ] Other (See Comments)    Per patient - told avoid while taking Plavix .   Influenza Virus Vaccine Swelling     Localized swelling at injection site   Motrin [Ibuprofen] Other (See Comments)    Per patient - told avoid while taking Plavix  by cardiology   Norvasc [Amlodipine Besylate] Other (See Comments)    Vertigo    Family History  Problem Relation Age of Onset   Heart disease Brother    Cancer Other        prostate   Ovarian cancer Neg Hx    Breast cancer Neg Hx    Uterine cancer Neg Hx    Colon cancer Neg Hx     Prior to Admission medications   Medication Sig Start Date End Date Taking? Authorizing Provider  amoxicillin  (AMOXIL ) 500 MG capsule Take 1 capsule (500 mg total) by mouth every 12 (twelve) hours for 7 days. 12/14/23 12/21/23 Yes Leotis Bogus, MD  Cholecalciferol  (VITAMIN D -3 PO) Take 1 capsule by mouth daily.   Yes [provider]  clopidogrel  (PLAVIX ) 75 MG tablet Take 1 tablet (75 mg total) by mouth daily. 10/30/23  Yes Kuneff, Renee A, DO  dorzolamide -timolol  (COSOPT ) 2-0.5 % ophthalmic solution Place 1 drop into both eyes 2 (two) times daily. Patient taking differently: Place 1 drop into the right eye 2 (two) times daily. 07/25/23  Yes Kuneff, Renee A, DO  hydrALAZINE  (APRESOLINE ) 25 MG tablet Take 1 tablet (25 mg total) by mouth every 8 (eight) hours. Patient taking differently: Take 25 mg by mouth every 8 (eight) hours. 10/30/23  Yes Kuneff, Renee A, DO  lisinopril  (ZESTRIL ) 40 MG tablet Take 1 tablet (40 mg total) by mouth daily. 10/30/23  Yes Kuneff, Renee A, DO  metoprolol  succinate (TOPROL -XL) 25 MG 24 hr tablet Take 1 tablet (25 mg total) by mouth daily. 10/30/23  Yes Kuneff, Renee A, DO  Multiple Vitamins-Minerals (MULTIVITAMIN WOMEN 50+) TABS Take 1 tablet by mouth daily.   Yes [provider]  Omega-3 Fatty Acids (FISH OIL PO) Take 1 capsule by mouth daily.   Yes [provider]  pravastatin  (PRAVACHOL ) 40 MG tablet Take 1 tablet (40 mg total) by mouth daily. 10/30/23  Yes Kuneff, Renee A, DO  QUEtiapine  (SEROQUEL ) 25 MG tablet Take 2-3 tablets  (50-75 mg total) by mouth at bedtime. Patient taking differently: Take 50 mg by mouth at bedtime. 11/13/23  Yes Kuneff, Renee A, DO  VYZULTA  0.024 % SOLN Apply 1 drop to eye at bedtime. Patient taking differently: Place 1 drop into both eyes at bedtime. 07/25/23  Yes Kuneff, Renee A, DO     Physical Exam: Vitals:   12/19/23 0315 12/19/23 0330 12/19/23 0555 12/19/23 0615  BP: (!) 102/53 (!) 105/52  (!) 126/99  Pulse: 72 71 70 73  Resp: 12 14 15 12   Temp:    98.3 F (36.8 C)  TempSrc:    Oral  SpO2: 97% 97% 98% 98%  Physical Exam Constitutional:      Appearance: She is not ill-appearing.     Comments: Drowsy and sleepy.  HENT:     Mouth/Throat:     Mouth: Mucous membranes are moist.  Eyes:     Pupils: Pupils are equal, round, and reactive to light.  Cardiovascular:     Rate and Rhythm: Normal rate and regular rhythm.     Pulses: Normal pulses.     Heart sounds: Normal heart sounds.  Abdominal:     Palpations: Abdomen is soft.  Musculoskeletal:        General: Tenderness present. No swelling, deformity or signs of injury.     Cervical back: Neck supple.  Skin:    Capillary Refill: Capillary refill takes less than 2 seconds.  Neurological:     Mental Status: She is oriented to person, place, and time.  Psychiatric:        Mood and Affect: Mood normal.      Labs on Admission: I have personally reviewed following labs and imaging studies  CBC: Recent Labs  Lab 12/14/23 0547 12/18/23 2154 12/19/23 0535  WBC 10.2 12.0* 11.1*  HGB 10.5* 10.1* 10.0*  HCT 32.3* 31.8* 31.6*  MCV 95.0 97.2 95.8  PLT 217 219 212   Basic Metabolic Panel: Recent Labs  Lab 12/14/23 0547 12/18/23 2154 12/19/23 0535  NA 136 138 136  K 3.8 4.1 3.9  CL 109 111 111  CO2 20* 20* 18*  GLUCOSE 117* 135* 116*  BUN 24* 25* 23  CREATININE 1.19* 1.14* 1.04*  CALCIUM 8.9 8.2* 8.3*  MG 1.8  --   --   PHOS 3.5  --   --    GFR: Estimated Creatinine Clearance: 28.6 mL/min (A) (by C-G  formula based on SCr of 1.04 mg/dL (H)). Liver Function Tests: Recent Labs  Lab 12/19/23 0535  AST 23  ALT 15  ALKPHOS 84  BILITOT 0.5  PROT 5.5*  ALBUMIN 2.4*    No results for input(s): LIPASE, AMYLASE in the last 168 hours. No results for input(s): AMMONIA in the last 168 hours. Coagulation Profile: No results for input(s): INR, PROTIME in the last 168 hours. Cardiac Enzymes: No results for input(s): CKTOTAL, CKMB, CKMBINDEX, TROPONINI, TROPONINIHS in the last 168 hours. BNP (last 3 results) No results for input(s): BNP in the last 8760 hours. HbA1C: No results for input(s): HGBA1C in the last 72 hours. CBG: No results for input(s): GLUCAP in the last 168 hours. Lipid Profile: No results for input(s): CHOL, HDL, LDLCALC, TRIG, CHOLHDL, LDLDIRECT in the last 72 hours. Thyroid  Function Tests: No results for input(s): TSH, T4TOTAL, FREET4, T3FREE, THYROIDAB in the last 72 hours. Anemia Panel: No results for input(s): VITAMINB12, FOLATE, FERRITIN, TIBC, IRON, RETICCTPCT in the last 72 hours. Urine analysis:    Component Value Date/Time   COLORURINE YELLOW 12/11/2023 1849   APPEARANCEUR TURBID (A) 12/11/2023 1849   LABSPEC 1.012 12/11/2023 1849   PHURINE 6.0 12/11/2023 1849   GLUCOSEU NEGATIVE 12/11/2023 1849   HGBUR SMALL (A) 12/11/2023 1849   BILIRUBINUR NEGATIVE 12/11/2023 1849   KETONESUR 5 (A) 12/11/2023 1849   PROTEINUR 100 (A) 12/11/2023 1849   NITRITE NEGATIVE 12/11/2023 1849   LEUKOCYTESUR LARGE (A) 12/11/2023 1849    Radiological Exams on Admission: I have personally reviewed images DG Pelvis Portable Result Date: 12/18/2023 EXAM: 1 or 2 VIEW(S) XRAY OF THE PELVIS 12/18/2023 11:07:34 PM COMPARISON: Radiograph earlier today. CLINICAL HISTORY: Post reduction FINDINGS: BONES AND JOINTS: Reduction of  left hip arthroplasty dislocation. The acetabular cup is unchanged in alignment directed laterally. Right  hip arthroplasty is intact where visualized. Cerclage wires around the visualized portion of the right femur. No acute fracture. No focal osseous lesion. SOFT TISSUES: The soft tissues are unremarkable. IMPRESSION: 1. Reduction of left hip arthroplasty dislocation. 2. No acute fracture. Electronically signed by: Andrea Gasman MD 12/18/2023 11:20 PM EDT RP Workstation: [id number]   DG Hip Unilat W or Wo Pelvis 2-3 Views Left Result Date: 12/18/2023 CLINICAL DATA:  Hip dislocation. EXAM: DG HIP (WITH OR WITHOUT PELVIS) 2-3V LEFT COMPARISON:  Left knee radiograph dated 12/11/2023. FINDINGS: Bilateral total hip arthroplasties. There is dislocation of the left hip arthroplasty. The femoral component of the arthroplasty is located slightly superior to the acetabular cup. No acute fracture. The bones are osteopenic. The soft tissues are unremarkable. IMPRESSION: Dislocation of the left hip arthroplasty. Electronically Signed   By: Vanetta Chou M.D.   On: 12/18/2023 21:13      Assessment/Plan: Principal Problem:   Hip dislocation, left (HCC) Active Problems:   Non-insulin  dependent type 2 diabetes mellitus (HCC)   Essential hypertension   Hyperlipidemia   CKD stage 3b, GFR 30-44 ml/min (HCC)   H/O total hip arthroplasty, left   History of dementia   Acute cystitis-patient has recent UTI 9/22   History of CVA (cerebrovascular accident)   Closed dislocation of left hip (HCC)    Assessment and Plan: Left prosthetic hip dislocation History of left hip arthroplasty History of recurrent left prosthetic hip dislocation -Presented to emergency department transferred from skilled nursing facility as x-ray showed left hip dislocation. In the ED dislocated hip has been reduced by Dr. Ginger and repeat x-ray showing reduction of the left hip arthroplasty.  No acute evidence of fracture. - At presentation to ED patient is hemodynamically stable except found borderline hypertensive. -Lab work, CBC  showing leukocytosis 12.  Reactive leukocytosis in acute setting.  At this time there is no concern for infection. BMP showing chronically low bicarb 20, creatinine 1.14 GFR 45.  Renal function at baseline. -EDP consulted orthopedics Dr.Olin plan for surgical intervention on Wednesday, 11/20/2023. -At home patient is on Plavix  for CVA.  Last dose of Plavix  on 9/29 hold Plavix . -Continue Norco for moderate pain control and Dilaudid for severe pain control.  History of CVA - Hold Plavix .  Last dose of Plavix  on 9/29  Non-insulin -dependent DM type II -Previously patient was diabetic.  A1c 6.2.  Continue heart healthy carb modified diet  Essential hypertension -After receiving lisinopril  40 mg patient blood pressure dropped 160 to 110 and heart rate in between 62-71.SABRA   Holding lisinopril  and Toprol -XL.  Based on blood pressure trend and heart rate can resume blood pressure medication. -As blood pressure dropped drastically giving 500 mL of LR bolus.  Physical exam showing patient is very dehydrated.  As she is drowsy with the effect of fentanyl  not sure how much she will able to eat throughout the [place] starting maintenance fluid LR 75 cc/h.  Hyperlipidemia -Continue Pravachol   History of dementia Generalized anxiety disorder - Continue Seroquel  at bedtime  Acute cystitis - Continue amoxicillin  until 10/2  Hyperlipidemia History of CVA  CKD 3B -BMP showing chronically low bicarb 20, creatinine 1.14 GFR 45.  Renal function at baseline.   DVT prophylaxis:  SCDs.  Deferring pharmacological DVT prophylaxis until surgical intervention Code Status:  Full Code Diet: Heart healthy carb modified diet Family Communication:   Family was present at bedside, at  the time of interview. Opportunity was given to ask question and all questions were answered satisfactorily.  Disposition Plan: Continue pain control.  Need to consult PT and OT after surgery. Consults: Orthopedic surgeon Admission status:    Inpatient, Telemetry bed  Severity of Illness: The appropriate patient status for this patient is INPATIENT. Inpatient status is judged to be reasonable and necessary in order to provide the required intensity of service to ensure the patient's safety. The patient's presenting symptoms, physical exam findings, and initial radiographic and laboratory data in the context of their chronic comorbidities is felt to place them at high risk for further clinical deterioration. Furthermore, it is not anticipated that the patient will be medically stable for discharge from the hospital within 2 midnights of admission.   * I certify that at the point of admission it is my clinical judgment that the patient will require inpatient hospital care spanning beyond 2 midnights from the point of admission due to high intensity of service, high risk for further deterioration and high frequency of surveillance required.DEWAINE    Rayven Rettig, MD [facility]  How to contact the [facility] Attending or Consulting provider 7A - 7P or covering provider during after hours 7P -7A, for this patient.  Check the care team in [facility] and look for a) attending/consulting [facility] provider listed and b) the [facility] team listed Log into [email or web address] and use [place]'s universal password to access. If you do not have the password, please contact the hospital operator. Locate the [facility] provider you are looking for under [facility] and page to a number that you can be directly reached. If you still have difficulty reaching the provider, please page the [facility] (Director on Call) for the Hospitalists listed on amion for assistance.  12/19/2023, 6:57 AM

## 2023-12-19 NOTE — Hospital Course (Signed)
  89 year old female history of dementia, DM type II, hypertension, hyperlipidemia, CKD 3B, blindness of the left eye, CVA, and recent hip dislocation due to mechanical fall which was reduced in the ED and discharged from the hospital 9/25 presented to emergency department via EMS for fall.  Patient coming from rehab where the x-ray was done which showed left hip dislocation again.  At presentation to ED patient found borderline hypertensive otherwise hemodynamically stable. BC showing leukocytosis 12.4 otherwise unremarkable.  BMP showing low bicarb 20, creatinine 1.14 GFR 45.  Renal function at baseline  In the ED left ear dislocation has been reduced by Dr. Ginger and x-ray showing reduction of the left hip arthroplasty dislocation.  No acute fracture.  ED physician consulted and discussed case with [facility] Dr. Olin recommended that that given patient has recurrent hip dislocation plan to take to  OR on Wednesday 11/20/23.  Hospitalist has been consulted for further evaluation management of recurrent left hip dislocation.

## 2023-12-19 NOTE — Progress Notes (Signed)
 Orthopedic Tech Progress Note Patient Details:  Madison Martinez Jul 01, 1927 [id number]  Called in order to [facility] to for a LEFT HIP ABDUCTION BRACE 60/15   Patient ID: Madison Martinez, female   DOB: 1928-02-20, 89 y.o.   MRN: [id number]  Madison Martinez 12/19/2023, 8:07 AM

## 2023-12-19 NOTE — Progress Notes (Signed)
 Courtesy visit- No billing.  Patient is seen and examined today morning in ED. She is lying in bed, has hard of hearing. Patient is admitted for left hip dislocation. Seen by ortho. She denies any pain during my exam.  Plan to continue pain control, follow ortho recommendations. She has hip abduction orthoses in place. WBAT with posterior hip precautions. PT/ OT for discharge planning. Further management per her clinical course.

## 2023-12-19 NOTE — ED Provider Notes (Incomplete)
 [place] [facility] Provider Note   CSN: [id number] Arrival date & time: 12/18/23  2035     Patient presents with: Left Hip Dislocation    Madison Martinez is a 89 y.o. female.  {Add pertinent medical, surgical, social history, OB history to HPI:32947} HPI  Patient is a 89 year old female with past medical history significant for DM2, HTN, HLD, gait issues, stroke, weakness, spinal stenosis, left eye blindness  Patient presents emergency room today with left hip pain seems that she rolled sideways in bed and dislocated.  History of similar 1 week ago.      Prior to Admission medications   Medication Sig Start Date End Date Taking? Authorizing Provider  amoxicillin  (AMOXIL ) 500 MG capsule Take 1 capsule (500 mg total) by mouth every 12 (twelve) hours for 7 days. 12/14/23 12/21/23  Leotis Bogus, MD  Cholecalciferol  (VITAMIN D -3 PO) Take 1 capsule by mouth daily.    [provider]  clopidogrel  (PLAVIX ) 75 MG tablet Take 1 tablet (75 mg total) by mouth daily. 10/30/23   Kuneff, Renee A, DO  dorzolamide -timolol  (COSOPT ) 2-0.5 % ophthalmic solution Place 1 drop into both eyes 2 (two) times daily. Patient taking differently: Place 1 drop into the right eye 2 (two) times daily. 07/25/23   Kuneff, Renee A, DO  hydrALAZINE  (APRESOLINE ) 25 MG tablet Take 1 tablet (25 mg total) by mouth every 8 (eight) hours. Patient taking differently: Take 25 mg by mouth in the morning and at bedtime. 10/30/23   Kuneff, Renee A, DO  lisinopril  (ZESTRIL ) 40 MG tablet Take 1 tablet (40 mg total) by mouth daily. 10/30/23   Kuneff, Renee A, DO  metoprolol  succinate (TOPROL -XL) 25 MG 24 hr tablet Take 1 tablet (25 mg total) by mouth daily. 10/30/23   Kuneff, Renee A, DO  Multiple Vitamins-Minerals (MULTIVITAMIN WOMEN 50+) TABS Take 1 tablet by mouth daily.    [provider]  Omega-3 Fatty Acids (FISH OIL PO) Take 1 capsule by mouth daily.    [provider]   pravastatin  (PRAVACHOL ) 40 MG tablet Take 1 tablet (40 mg total) by mouth daily. 10/30/23   Kuneff, Renee A, DO  QUEtiapine  (SEROQUEL ) 25 MG tablet Take 2-3 tablets (50-75 mg total) by mouth at bedtime. Patient taking differently: Take 75 mg by mouth at bedtime. 11/13/23   Kuneff, Renee A, DO  VYZULTA  0.024 % SOLN Apply 1 drop to eye at bedtime. Patient taking differently: Place 1 drop into the right eye at bedtime. 07/25/23   Kuneff, Renee A, DO    Allergies: Bayer aspirin  [aspirin ], Influenza virus vaccine, Motrin [ibuprofen], and Norvasc [amlodipine besylate]    Review of Systems  Updated Vital Signs BP (!) 168/80   Pulse 69   Temp 98.2 F (36.8 C) (Oral)   Resp 17   LMP 03/21/1978 (Approximate)   SpO2 99%   Physical Exam Vitals and nursing note reviewed.  Constitutional:      General: She is not in acute distress.    Appearance: Normal appearance. She is not ill-appearing.  HENT:     Head: Normocephalic and atraumatic.  Eyes:     General: No scleral icterus.       Right eye: No discharge.        Left eye: No discharge.     Conjunctiva/sclera: Conjunctivae normal.  Pulmonary:     Effort: Pulmonary effort is normal.     Breath sounds: No stridor.  Neurological:     Mental  Status: She is alert and oriented to person, place, and time. Mental status is at baseline.     (all labs ordered are listed, but only abnormal results are displayed) Labs Reviewed  BASIC METABOLIC PANEL WITH GFR - Abnormal; Notable for the following components:      Result Value   CO2 20 (*)    Glucose, Bld 135 (*)    BUN 25 (*)    Creatinine, Ser 1.14 (*)    Calcium 8.2 (*)    GFR, Estimated 45 (*)    All other components within normal limits  CBC - Abnormal; Notable for the following components:   WBC 12.0 (*)    RBC 3.27 (*)    Hemoglobin 10.1 (*)    HCT 31.8 (*)    All other components within normal limits    EKG: None  Radiology: DG Hip Unilat W or Wo Pelvis 2-3 Views Left Result  Date: 12/18/2023 CLINICAL DATA:  Hip dislocation. EXAM: DG HIP (WITH OR WITHOUT PELVIS) 2-3V LEFT COMPARISON:  Left knee radiograph dated 12/11/2023. FINDINGS: Bilateral total hip arthroplasties. There is dislocation of the left hip arthroplasty. The femoral component of the arthroplasty is located slightly superior to the acetabular cup. No acute fracture. The bones are osteopenic. The soft tissues are unremarkable. IMPRESSION: Dislocation of the left hip arthroplasty. Electronically Signed   By: Vanetta Chou M.D.   On: 12/18/2023 21:13    {Document cardiac monitor, telemetry assessment procedure when appropriate:32947} .Reduction of dislocation  Date/Time: 12/18/2023 10:59 PM  Performed by: Neldon Hamp RAMAN, PA Authorized by: Neldon Hamp RAMAN, PA  Consent: Verbal consent obtained Risks and benefits: risks, benefits and alternatives were discussed Consent given by: patient Patient understanding: patient states understanding of the procedure being performed Patient consent: the patient's understanding of the procedure matches consent given Relevant documents: relevant documents present and verified Test results: test results available and properly labeled Imaging studies: imaging studies available Patient identity confirmed: verbally with patient and arm band Local anesthesia used: no  Anesthesia: Local anesthesia used: no  Sedation: Patient sedated: no  Patient tolerance: patient tolerated the procedure well with no immediate complications Comments: L hip reduced under propofol  sedation       Medications Ordered in the ED  propofol  (DIPRIVAN ) 10 mg/mL bolus/IV push 30 mg (has no administration in time range)  propofol  (DIPRIVAN ) 10 mg/mL bolus/IV push (30 mg Intravenous Given 12/18/23 2252)    Clinical Course as of 12/18/23 2259  Mon Dec 18, 2023  2150 Reduce - admit to medicine and have ortho consult for possible elective Wednesday procedure. [WF]    Clinical Course User  Index [WF] Neldon Hamp RAMAN, PA   {Click here for ABCD2, HEART and other calculators REFRESH Note before signing:1}                              Medical Decision Making Amount and/or Complexity of Data Reviewed Labs: ordered. Radiology: ordered.  Risk Decision regarding hospitalization.   ***  {Document critical care time when appropriate  Document review of labs and clinical decision tools ie CHADS2VASC2, etc  Document your independent review of radiology images and any outside records  Document your discussion with family members, caretakers and with consultants  Document social determinants of health affecting pt's care  Document your decision making why or why not admission, treatments were needed:32947:::1}   Final diagnoses:  Anterior dislocation of left hip, initial encounter (  [facility])    ED Discharge Orders     None

## 2023-12-19 NOTE — ED Notes (Signed)
 CCMD called for cardiac monitoring.

## 2023-12-19 NOTE — TOC CAGE-AID Note (Signed)
 Transition of Care [facility]) - CAGE-AID Screening   Patient Details  Name: Madison Martinez MRN: [id number] Date of Birth: 05-15-1927  Transition of Care [facility]) CM/SW Contact:    LEBRON ROCKIE ORN, RN Phone Number: [phone] 12/19/2023, 7:26 PM   Clinical Narrative: Pt denies alcohol or drug use.  Screening complete.   CAGE-AID Screening:    Have You Ever Felt You Ought to Cut Down on Your Drinking or Drug Use?: No Have People Annoyed You By Critizing Your Drinking Or Drug Use?: No Have You Felt Bad Or Guilty About Your Drinking Or Drug Use?: No Have You Ever Had a Drink or Used Drugs First Thing In The Morning to Steady Your Nerves or to Get Rid of a Hangover?: No CAGE-AID Score: 0  Substance Abuse Education Offered: No

## 2023-12-20 DIAGNOSIS — N1832 Chronic kidney disease, stage 3b: Secondary | ICD-10-CM | POA: Diagnosis not present

## 2023-12-20 DIAGNOSIS — E119 Type 2 diabetes mellitus without complications: Secondary | ICD-10-CM | POA: Diagnosis not present

## 2023-12-20 DIAGNOSIS — S73005A Unspecified dislocation of left hip, initial encounter: Secondary | ICD-10-CM | POA: Diagnosis not present

## 2023-12-20 DIAGNOSIS — Z8673 Personal history of transient ischemic attack (TIA), and cerebral infarction without residual deficits: Secondary | ICD-10-CM | POA: Diagnosis not present

## 2023-12-20 NOTE — Care Management Obs Status (Signed)
 MEDICARE OBSERVATION STATUS NOTIFICATION   Patient Details  Name: Madison Martinez MRN: [id number] Date of Birth: Aug 31, 1927   Medicare Observation Status Notification Given:  Yes    Nena LITTIE Coffee, RN 12/20/2023, 5:55 PM

## 2023-12-20 NOTE — Evaluation (Signed)
 Physical Therapy Evaluation Patient Details Name: Madison Martinez MRN: [id number] DOB: 11-03-1927 Today's Date: 12/20/2023  History of Present Illness  Madison Martinez is a 89 y.o. female admitted 12/18/23 for left hip dislocation. X-rays pelvis/hip demonstrate a concentrically reduced left hip arthroplasty and notably the acetabular component is lateralized and vertical, which may predispose her to continued instability and dislocation. Plan for conservative measures hopeful to avoid additional surgery. PMHx: T2DM, HTN, HLD, L eye blindness, CVA with residual gait abnormality, osteoporosis, polyneuropathy, spinal stenosis, and dementia. Of note, recent hospitalization 9/22-9/25 for L hip dislocation after fall at home, reduced in ED and Ortho recommend non-operative mgmt, knee immobilizer, WBAT and outpatient follow up.   Clinical Impression  Pt admitted with above diagnosis. Examination limited by impaired cognition secondary to dementia, pt being a poor historian, and lack of family present. PTA, pt required assist with functional mobility, ADLs, and IADLs. She was residing at [facility] receiving therapy services. Pt currently with functional limitations due to the deficits listed below (see PT Problem List). She required maxA x2 for bed mobility and transfers. Pt initially stood using RW, but demonstrated a posterior bias and quickly fatigued. Utilized stedy to complete bed>chair transfer with maxA x2 and lift pad positioned underneath her. She is currently limited by pain, decreased safety awareness, posterior hip precautions, impaired balance, and limited activity tolerance. Pt will benefit from acute skilled PT to maximize her independence and safety with mobility to allow discharge. Recommend continued inpatient follow up therapy, <3 hours/Balz.    If plan is discharge home, recommend the following: Two people to help with walking and/or transfers;Two people to help with  bathing/dressing/bathroom;Assistance with cooking/housework;Assist for transportation;Help with stairs or ramp for entrance   Can travel by private vehicle   No    Equipment Recommendations Hospital bed;Hoyer lift;Wheelchair (measurements PT);Wheelchair cushion (measurements PT)  Recommendations for Other Services       Functional Status Assessment Patient has had a recent decline in their functional status and demonstrates the ability to make significant improvements in function in a reasonable and predictable amount of time.     Precautions / Restrictions Precautions Precautions: Posterior Hip;Fall Precaution Booklet Issued: Yes (comment) Recall of Precautions/Restrictions: Impaired Precaution/Restrictions Comments: Educated pt on posterior hip precautions: no bending hip over 90 degrees, do not cross legs, do not let foot roll inward. Cues to adhere throughout mobility. Required Braces or Orthoses: Other Brace;Spinal Brace Spinal Brace: Lumbar corset (pt greeted with brace on underneath gown supine in bed; no active orders for this.) Other Brace: Hip Abduction Brace Restrictions Weight Bearing Restrictions Per Provider Order: Yes LLE Weight Bearing Per Provider Order: Weight bearing as tolerated      Mobility  Bed Mobility Overal bed mobility: Needs Assistance Bed Mobility: Supine to Sit     Supine to sit: Max assist, +2 for physical assistance     General bed mobility comments: Pt sat up on L side of bed. PT and Mobility Specialists performed the helicopter technique with use of bed pad to pivot pt to EOB with feet flat on floor.    Transfers Overall transfer level: Needs assistance Equipment used: Rolling walker (2 wheels), Ambulation equipment used Transfers: Sit to/from Stand, Bed to chair/wheelchair/BSC Sit to Stand: Max assist, +2 physical assistance, From elevated surface, Via lift equipment           General transfer comment: Pt stood from slightly raised  bed height. Cued proper hand placement using RW. Powered up  with maxA x2. Pt with posteiror bias lacking full hip ext and quickly fatiguing. Returned pt to sitting. Introduced stedy and educated pt on proper use of equipment. Assist to place feet on foot plate and position correctly. Pt powered up with maxA x2 and aid of bed pad to facilitate full hip ext. Stedy seat flaps placed behind her and transferred to recliner chair. Powered up with maxA x2 and ease down to recliner chair. Transfer via Lift Equipment: Stedy  Ambulation/Gait               General Gait Details: Warden/ranger Bed    Modified Rankin (Stroke Patients Only)       Balance Overall balance assessment: Needs assistance, History of Falls Sitting-balance support: Bilateral upper extremity supported, Feet supported Sitting balance-Leahy Scale: Fair   Postural control: Posterior lean Standing balance support: Bilateral upper extremity supported, During functional activity, Reliant on assistive device for balance Standing balance-Leahy Scale: Zero Standing balance comment: Pt dependent on maxA x2 and use of stedy                             Pertinent Vitals/Pain Pain Assessment Pain Assessment: PAINAD Breathing: occasional labored breathing, short period of hyperventilation Negative Vocalization: occasional moan/groan, low speech, negative/disapproving quality Facial Expression: sad, frightened, frown Body Language: tense, distressed pacing, fidgeting Consolability: distracted or reassured by voice/touch PAINAD Score: 5 Pain Descriptors / Indicators: Discomfort, Aching, Grimacing Pain Intervention(s): Monitored during session, Limited activity within patient's tolerance, Repositioned    Home Living Family/patient expects to be discharged to:: Skilled nursing facility [facility] [place])                 Home Equipment:  Agricultural consultant (2 wheels);Shower seat;Rollator (4 wheels)      Prior Function Prior Level of Function : Patient poor historian/Family not available;History of Falls (last six months)             Mobility Comments: Per chart review, pt was modA x2 for bed mobility and maxA x2 for transfers using stedy. Previously pt reports ambulating using rollator. At least 1 fall in Sept resulting in L hip dislocation. ADLs Comments: Per chart review, pt was set-up assist for eating, grooming, UB bathing, and UB dressing; maxA x2 for LB bathing, LB dressing,  modA x2 for toileting.     Extremity/Trunk Assessment   Upper Extremity Assessment Upper Extremity Assessment: Defer to OT evaluation    Lower Extremity Assessment Lower Extremity Assessment: Generalized weakness;LLE deficits/detail LLE Deficits / Details: Pt with limited hip AROM. Maintained posterior hip precautions throughout mobility. Decreased knee and ankle AROM. Pt resting with foot PF and inverted. Grossly 2+/5 strength. LLE: Unable to fully assess due to pain;Unable to fully assess due to immobilization LLE Coordination: decreased gross motor    Cervical / Trunk Assessment Cervical / Trunk Assessment: Kyphotic  Communication   Communication Communication: Impaired Factors Affecting Communication: Hearing impaired    Cognition Arousal: Alert Behavior During Therapy: Flat affect, Anxious   PT - Cognitive impairments: History of cognitive impairments (Dementia)                         Following commands: Impaired Following commands impaired: Follows one step commands inconsistently, Follows one step commands with increased time  Cueing Cueing Techniques: Verbal cues, Gestural cues, Tactile cues     General Comments General comments (skin integrity, edema, etc.): VSS on RA    Exercises     Assessment/Plan    PT Assessment Patient needs continued PT services  PT Problem List Decreased strength;Decreased  range of motion;Decreased activity tolerance;Decreased balance;Decreased mobility;Decreased coordination;Decreased cognition;Decreased knowledge of use of DME;Decreased safety awareness;Decreased knowledge of precautions;Pain       PT Treatment Interventions DME instruction;Gait training;Functional mobility training;Therapeutic activities;Therapeutic exercise;Balance training;Neuromuscular re-education;Cognitive remediation;Patient/family education;Wheelchair mobility training    PT Goals (Current goals can be found in the Care Plan section)  Acute Rehab PT Goals PT Goal Formulation: Patient unable to participate in goal setting Time For Goal Achievement: 01/03/24 Potential to Achieve Goals: Fair    Frequency Min 2X/week     Co-evaluation               AM-PAC PT 6 Clicks Mobility  Outcome Measure Help needed turning from your back to your side while in a flat bed without using bedrails?: Total Help needed moving from lying on your back to sitting on the side of a flat bed without using bedrails?: Total Help needed moving to and from a bed to a chair (including a wheelchair)?: Total Help needed standing up from a chair using your arms (e.g., wheelchair or bedside chair)?: Total Help needed to walk in hospital room?: Total Help needed climbing 3-5 steps with a railing? : Total 6 Click Score: 6    End of Session Equipment Utilized During Treatment: Gait belt Activity Tolerance: Patient tolerated treatment well;Patient limited by pain Patient left: in chair;with call bell/phone within reach;with chair alarm set Nurse Communication: Mobility status;Need for lift equipment (maximove) PT Visit Diagnosis: Unsteadiness on feet (R26.81);History of falling (Z91.81);Muscle weakness (generalized) (M62.81);Difficulty in walking, not elsewhere classified (R26.2);Pain Pain - Right/Left: Left Pain - part of body: Hip    Time: 8482-8461 PT Time Calculation (min) (ACUTE ONLY): 21  min   Charges:   PT Evaluation $PT Eval Moderate Complexity: 1 Mod   PT General Charges $$ ACUTE PT VISIT: 1 Visit         Randall SAUNDERS, PT, DPT Acute Rehabilitation Services Office: [phone] Secure Chat Preferred  Delon CHRISTELLA Callander 12/20/2023, 4:22 PM

## 2023-12-20 NOTE — Care Management CC44 (Signed)
 Condition Code 44 Documentation Completed  Patient Details  Name: Seher Schlagel Kallstrom MRN: [id number] Date of Birth: 1927-05-20   Condition Code 44 given:  Yes Patient signature on Condition Code 44 notice:  Yes Documentation of 2 MD's agreement:  Yes Code 44 added to claim:  Yes    Nena LITTIE Coffee, RN 12/20/2023, 5:56 PM

## 2023-12-20 NOTE — Plan of Care (Signed)

## 2023-12-20 NOTE — Progress Notes (Signed)
 Progress Note   Patient: Madison Martinez DOB: [birth year] DOA: 12/18/2023     1 DOS: the patient was seen and examined on 12/20/2023   Brief hospital course:  Madison Martinez is a 89 y.o. female  history of dementia, DM type II, hypertension, hyperlipidemia, CKD 3B, blindness of the left eye, CVA, and recent hip dislocation due to mechanical fall which was reduced in the ED and discharged from the hospital 9/25.  She presented to ED on 9/29 via EMS after a fall with recurrent left hip pain, x-ray at the rehab facility showed recurrent left hip dislocation.  Patient was seen by orthopedics in the ED and the hip was reduced.     Assessment and Plan:  Left prosthetic hip dislocation - recurrent History of left hip arthroplasty History of recurrent left prosthetic hip dislocation Dislocated hip was reduced in ED by Dr. Ginger.   Repeat x-ray showing reduction of the left hip arthroplasty.  No acute evidence of fracture. --Ortho following, appreciate recommendations: --Hip Abduction orthosis --Posterior hip precautions --Ortho monitoring closely, may require surgical intervention --WBAT with posterior hip precautions --Pt takes Plavix  for prior CVA, last dose was 9/29  --Hold Plavix  --Pain control per orders    History of CVA - Hold Plavix .  Last dose of Plavix  on 9/29   Non-insulin -dependent DM type II -Previously patient was diabetic.  A1c 6.2.  Continue heart healthy carb modified diet   Essential hypertension - BP's just mildly elevated but acceptable for age and in setting of pain -After receiving lisinopril  40 mg patient blood pressure dropped 160 to 110, given IV fluids for dehydration   --Holding lisinopril  and Toprol -XL, resume when BP will tolerate   Hyperlipidemia -Continue Pravachol    History of dementia Generalized anxiety disorder - Continue Seroquel  at bedtime   Acute cystitis - Continue amoxicillin  until 10/2   Hyperlipidemia History of CVA   CKD  stage 3B Renal function at baseline. --Monitor     Subjective: Pt seen awake resting in bed today. She asks if the hip brace can be removed, it is quite uncomfortable.  Pt reports lack of sleep overnight.  Besides pain, pt denies other acute complaints.   Physical Exam: Vitals:   12/19/23 1617 12/19/23 2012 12/20/23 0500 12/20/23 0731  BP: (!) 158/62 (!) 152/60 (!) 156/77 (!) 143/67  Pulse: 80 91 83 79  Resp: 18 18  18   Temp: 97.7 F (36.5 C) 98 F (36.7 C) 97.7 F (36.5 C) 97.6 F (36.4 C)  TempSrc:  Oral  Oral  SpO2: 99% 97% 98% 96%  Weight:      Height:       General exam: awake, alert, no acute distress HEENT: moist mucus membranes, hard of hearing  Respiratory system: CTAB, no wheezes, rales or rhonchi, normal respiratory effort. Cardiovascular system: normal S1/S2, RRRedema.   Gastrointestinal system: soft, NT, ND Central nervous system: A&O x self and hospital at least. no gross focal neurologic deficits, normal speech Extremities:left hip abduction brace in place Skin: dry, intact, normal temperature   Data Reviewed:  Notable labs -- bicarb 18, Cr 1.04, Ca 8.3, WBC 11.1 reactive suspect, Hbg 10.0 stable, albumin 2.4  Family Communication: none present on rounds, will attempt to call as time allows  Disposition: Status is: Inpatient Remains inpatient appropriate because: Ongoing evaluation, monitoring.  Pending Ortho's clearance for d/c.   Planned Discharge Destination: Skilled nursing facility    Time spent: 42 minutes  Author: Burnard DELENA Cunning, DO 12/20/2023  1:51 PM  For on call review [email or web address].

## 2023-12-21 ENCOUNTER — Observation Stay (HOSPITAL_COMMUNITY)

## 2023-12-21 ENCOUNTER — Encounter: Admitting: Physician Assistant

## 2023-12-21 DIAGNOSIS — Z8659 Personal history of other mental and behavioral disorders: Secondary | ICD-10-CM | POA: Diagnosis not present

## 2023-12-21 DIAGNOSIS — T84021A Dislocation of internal left hip prosthesis, initial encounter: Secondary | ICD-10-CM | POA: Diagnosis not present

## 2023-12-21 DIAGNOSIS — Z8673 Personal history of transient ischemic attack (TIA), and cerebral infarction without residual deficits: Secondary | ICD-10-CM | POA: Diagnosis not present

## 2023-12-21 DIAGNOSIS — S73005A Unspecified dislocation of left hip, initial encounter: Secondary | ICD-10-CM | POA: Diagnosis not present

## 2023-12-21 DIAGNOSIS — E119 Type 2 diabetes mellitus without complications: Secondary | ICD-10-CM | POA: Diagnosis not present

## 2023-12-21 LAB — CBC
HCT: 32.5 % — ABNORMAL LOW (ref 36.0–46.0)
Hemoglobin: 10.5 g/dL — ABNORMAL LOW (ref 12.0–15.0)
MCH: 30.6 pg (ref 26.0–34.0)
MCHC: 32.3 g/dL (ref 30.0–36.0)
MCV: 94.8 fL (ref 80.0–100.0)
Platelets: 212 K/uL (ref 150–400)
RBC: 3.43 MIL/uL — ABNORMAL LOW (ref 3.87–5.11)
RDW: 12.8 % (ref 11.5–15.5)
WBC: 10.5 K/uL (ref 4.0–10.5)
nRBC: 0 % (ref 0.0–0.2)

## 2023-12-21 LAB — BASIC METABOLIC PANEL WITH GFR
Anion gap: 9 (ref 5–15)
BUN: 23 mg/dL (ref 8–23)
CO2: 21 mmol/L — ABNORMAL LOW (ref 22–32)
Calcium: 8.7 mg/dL — ABNORMAL LOW (ref 8.9–10.3)
Chloride: 105 mmol/L (ref 98–111)
Creatinine, Ser: 0.96 mg/dL (ref 0.44–1.00)
GFR, Estimated: 55 mL/min — ABNORMAL LOW (ref >60)
Glucose, Bld: 113 mg/dL — ABNORMAL HIGH (ref 70–99)
Potassium: 3.8 mmol/L (ref 3.5–5.1)
Sodium: 135 mmol/L (ref 135–145)

## 2023-12-21 NOTE — TOC Initial Note (Signed)
 Transition of Care [facility]) - Initial/Assessment Note    Patient Details  Name: Madison Martinez MRN: [id number] Date of Birth: 12-28-1927  [facility]) CM/SW Contact:    Bridget Cordella Simmonds, LCSW Phone Number: 12/21/2023, 11:17 AM  Clinical Narrative:      Pt oriented x1-3 over past few days,  not really able to participate in any conversation.  CSW spoke with son Max, pt just left last week for STR at [facility].  Discussed PT recommendations remains SNF and son would like return to [place].    CSW message with [facility]: no beds available currently.  Pt son did not do a bed hold.  Pt can return when bed available.              Expected Discharge Plan: Skilled Nursing Facility Barriers to Discharge: Continued Medical Work up   Patient Goals and CMS Choice     Choice offered to / list presented to : Adult Children (son Max)      Expected Discharge Plan and Services In-house Referral: Clinical Social Work   Post Acute Care Choice: Skilled Nursing Facility Living arrangements for the past 2 months: Single Family Home                                      Prior Living Arrangements/Services Living arrangements for the past 2 months: Single Family Home Lives with:: Adult Children (with son Max) Patient language and need for interpreter reviewed:: Yes        Need for Family Participation in Patient Care: Yes (Comment) Care giver support system in place?: Yes (comment) Current home services: Other (comment) (none) Criminal Activity/Legal Involvement Pertinent to Current Situation/Hospitalization: No - Comment as needed  Activities of Daily Living   ADL Screening (condition at time of admission) Independently performs ADLs?: No Does the patient have a NEW difficulty with bathing/dressing/toileting/self-feeding that is expected to last >3 days?: No Does the patient have a NEW difficulty with getting in/out of bed, walking, or climbing stairs that is  expected to last >3 days?: No Does the patient have a NEW difficulty with communication that is expected to last >3 days?: No Is the patient deaf or have difficulty hearing?: Yes Does the patient have difficulty seeing, even when wearing glasses/contacts?: Yes Does the patient have difficulty concentrating, remembering, or making decisions?: Yes  Permission Sought/Granted                  Emotional Assessment Appearance:: Appears stated age Attitude/Demeanor/Rapport: Engaged Affect (typically observed): Pleasant Orientation: : Fluctuating Orientation (Suspected and/or reported Sundowners)      Admission diagnosis:  Closed dislocation of left hip (HCC) [S73.005A] Anterior dislocation of left hip, initial encounter [facility]) [S73.035A] Patient Active Problem List   Diagnosis Date Noted   Hip dislocation, left (HCC) 12/19/2023   H/O total hip arthroplasty, left 12/19/2023   History of dementia 12/19/2023   History of CVA (cerebrovascular accident) 12/19/2023   Closed dislocation of left hip (HCC) 12/19/2023   Acute cystitis 12/19/2023   Fall 12/11/2023   History of excision of lamina of lumbar vertebra for decompression of spinal cord 09/20/2019   Thickened endometrium 08/06/2019   Abnormal appearance of cervix 08/06/2019   Fluid in endometrial cavity 07/04/2019   Aortic atherosclerosis 05/23/2019   Calculus of gallbladder without cholecystitis without obstruction 05/23/2019   Sigmoid diverticulitis 05/23/2019   Ovarian mass, right 05/23/2019  Chronic anticoagulation 05/23/2019   Aneurysm of internal carotid artery 05/23/2019   Abnormal EEG 05/23/2019   Small vessel disease 05/23/2019   Advanced cerebral atrophy (HCC) 05/23/2019   Aortic valve calcification 05/23/2019   CVA (cerebral vascular accident) (HCC) 05/16/2019   Colitis 05/15/2019   Skin cancer 12/14/2018   Mild cognitive impairment 08/06/2018   Right lumbar radiculopathy 09/25/2017   At high risk for falls  09/25/2017   Use of cane as ambulatory aid 12/16/2016   CKD stage 3b, GFR 30-44 ml/min (HCC) 12/16/2016   Hyperlipidemia    Osteoporosis    Polyneuropathy    Spinal stenosis    Hard of hearing 12/02/2015   Monoclonal B-cell lymphocytosis 11/07/2013   Blind hypertensive eye 04/18/2012   Non-insulin  dependent type 2 diabetes mellitus (HCC) 03/06/2012   Essential hypertension 03/06/2012   Primary open angle glaucoma 01/06/2012   Pseudophakia 01/06/2012   Stable branch retinal vein occlusion (HCC) 01/06/2012   PCP:  Catherine Charlies LABOR, DO Pharmacy:   [facility], [place] - [address] [place] [address] Phone: [phone] Fax: [phone]  [facility] [place], [place] - [address] [place] [address] Phone: [phone] Fax: [phone]     Social Drivers of Health (SDOH) Social History: SDOH Screenings   Food Insecurity: Patient Unable To Answer (12/19/2023)  Housing: Unknown (12/19/2023)  Transportation Needs: No Transportation Needs (12/19/2023)  Utilities: Not At Risk (12/19/2023)  Alcohol Screen: Low Risk  (08/12/2020)  Depression (PHQ2-9): Medium Risk (08/11/2023)  Financial Resource Strain: Low Risk  (08/31/2022)  Physical Activity: Insufficiently Active (08/31/2022)  Social Connections: Socially Isolated (12/19/2023)  Stress: No Stress Concern Present (08/31/2022)  Tobacco Use: Low Risk  (12/19/2023)   SDOH Interventions:     Readmission Risk Interventions     No data to display

## 2023-12-21 NOTE — Plan of Care (Signed)
  Problem: Education: Goal: Knowledge of General Education information will improve Description: Including pain rating scale, medication(s)/side effects and non-pharmacologic comfort measures Outcome: Progressing   Problem: Clinical Measurements: Goal: Will remain free from infection Outcome: Progressing   Problem: Coping: Goal: Level of anxiety will decrease Outcome: Progressing   Problem: Pain Managment: Goal: General experience of comfort will improve and/or be controlled Outcome: Progressing   Problem: Safety: Goal: Ability to remain free from injury will improve Outcome: Progressing

## 2023-12-21 NOTE — Progress Notes (Signed)
 Progress Note   Patient: Madison Martinez Lio FMW:[id number] DOB: [birth year] DOA: 12/18/2023     1 DOS: the patient was seen and examined on 12/21/2023   Brief hospital course:  Cola M Huffaker is a 89 y.o. female  history of dementia, DM type II, hypertension, hyperlipidemia, CKD 3B, blindness of the left eye, CVA, and recent hip dislocation due to mechanical fall which was reduced in the ED and discharged from the hospital 9/25.  She presented to ED on 9/29 via EMS after a fall with recurrent left hip pain, x-ray at the rehab facility showed recurrent left hip dislocation.  Patient was seen by orthopedics in the ED and the hip was reduced.    Patient was admitted for further evaluation and management as outlined in detail below.   Assessment and Plan:  Left prosthetic hip dislocation - recurrent History of left hip arthroplasty History of recurrent left prosthetic hip dislocation Dislocated hip was reduced in ED by Dr. Ginger.   Repeat x-ray showing reduction of the left hip arthroplasty.  No acute evidence of fracture. --Ortho following, appreciate recommendations: --Hip Abduction orthosis --Posterior hip precautions --Ortho monitoring closely, may require surgical intervention --WBAT with posterior hip precautions --Pt takes Plavix  for prior CVA, last dose was 9/29  --Hold Plavix  --Pain control per orders    History of CVA - Hold Plavix .  Last dose of Plavix  on 9/29   Non-insulin -dependent DM type II -Previously patient was diabetic.  A1c 6.2.  Continue heart healthy carb modified diet   Essential hypertension - BP's just mildly elevated but acceptable for age and in setting of pain -After receiving lisinopril  40 mg patient blood pressure dropped 160 to 110, given IV fluids for dehydration   --Holding lisinopril  and Toprol -XL, resume when BP will tolerate   Hyperlipidemia -Continue Pravachol    History of dementia Generalized anxiety disorder - Continue Seroquel  at bedtime    Acute cystitis - Continue amoxicillin  until 10/2   Hyperlipidemia History of CVA   CKD stage 3B Renal function at baseline. --Monitor     Subjective: Pt seen awake sitting up in bed, OT had just arrived to room to find patient with hip brace not correctly in place. Pt was sitting with feet crossed, so left hip internally rotated which positioning risks another dislocation.  OT expresses concern SNF staff will not be able to monitor this closely enough.   Pt denies complaints other than being uncomfortable with the brace. No acute events reported.   Physical Exam: Vitals:   12/20/23 1404 12/20/23 1957 12/21/23 0352 12/21/23 0846  BP: 138/89 (!) 150/69 (!) 124/54 (!) 101/52  Pulse: 90 86 79 86  Resp: 18 15  16   Temp: 98.2 F (36.8 C) 98.4 F (36.9 C) 97.8 F (36.6 C) 98 F (36.7 C)  TempSrc: Oral     SpO2: 96% 98% 96% 99%  Weight:      Height:       General exam: awake, alert, no acute distress, pleasantly confused HEENT: moist mucus membranes, hard of hearing  Respiratory system: CTAB, no wheezes, rales or rhonchi, normal respiratory effort. Cardiovascular system: normal S1/S2, RRRedema.   Gastrointestinal system: soft, NT, ND Central nervous system: A&O x self and hospital at least. no gross focal neurologic deficits, normal speech Extremities:left hip abduction brace in place Skin: dry, intact, normal temperature   Data Reviewed:  Notable labs -- bicarb 18>>21, Cr 1.04 >> 0.96, Ca 8.7, Hbg 10.0>>10.5 stable   Family Communication: none present on rounds,  will attempt to call as time allows  Disposition: Status is: Inpatient Remains inpatient appropriate because: Ongoing evaluation, monitoring.  Pending Ortho's clearance for d/c.  Pt is at high risk for recurrent hip dislocation.   Planned Discharge Destination: Skilled nursing facility    Time spent: 42 minutes  Author: Burnard DELENA Cunning, DO 12/21/2023 1:45 PM  For on call review [email or web address].

## 2023-12-21 NOTE — Evaluation (Signed)
 Occupational Therapy Evaluation Patient Details Name: Madison Martinez MRN: [id number] DOB: [birth year] Today's Date: 12/21/2023   History of Present Illness   Madison Martinez is a 89 y.o. female admitted 12/18/23 for left hip dislocation. X-rays pelvis/hip demonstrate a concentrically reduced left hip arthroplasty and notably the acetabular component is lateralized and vertical, which may predispose her to continued instability and dislocation. Plan for conservative measures hopeful to avoid additional surgery. PMHx: T2DM, HTN, HLD, L eye blindness, CVA with residual gait abnormality, osteoporosis, polyneuropathy, spinal stenosis, and dementia. Of note, recent hospitalization 9/22-9/25 for L hip dislocation after fall at home. Pt was reduced in ED and Ortho recommend non-operative mgmt, knee immobilizer, WBAT and outpatient follow up.     Clinical Impressions Pt is a poor historian with no family in room to determine baseline, pt was needing +2 assist during previous hospital admission. L hip internally rotated upon OT's arrival with pt in supine. Adjusted hip abduction brace. Pt needed +2 max assist for bed mobility and standing. She requires set up to total +2 assist for ADLs. Pt declined transfer to chair. Returned to supine and placed blanket roll between knees and floated heels on pillow, RN notified. Patient will benefit from continued inpatient follow up therapy, <3 hours/[facility].     If plan is discharge home, recommend the following:   Two people to help with walking and/or transfers;Two people to help with bathing/dressing/bathroom;Assist for transportation;Help with stairs or ramp for entrance;Assistance with cooking/housework;Direct supervision/assist for medications management;Direct supervision/assist for financial management     Functional Status Assessment   Patient has had a recent decline in their functional status and/or demonstrates limited ability to make significant improvements in  function in a reasonable and predictable amount of time     Equipment Recommendations   Other (comment) (defer)     Recommendations for Other Services         Precautions/Restrictions   Precautions Precautions: Posterior Hip;Fall Precaution Booklet Issued: Yes (comment) Recall of Precautions/Restrictions: Impaired Required Braces or Orthoses: Other Brace Other Brace: Hip Abduction Brace Restrictions Weight Bearing Restrictions Per Provider Order: No LLE Weight Bearing Per Provider Order: Weight bearing as tolerated     Mobility Bed Mobility Overal bed mobility: Needs Assistance Bed Mobility: Supine to Sit, Sit to Supine     Supine to sit: Max assist, +2 for physical assistance Sit to supine: +2 for physical assistance, Max assist   General bed mobility comments: assist for all aspects    Transfers Overall transfer level: Needs assistance Equipment used: 2 person hand held assist Transfers: Sit to/from Stand Sit to Stand: +2 physical assistance, Max assist           General transfer comment: stood from elevated bed      Balance Overall balance assessment: Needs assistance, History of Falls Sitting-balance support: Single extremity supported, No upper extremity supported Sitting balance-Leahy Scale: Fair Sitting balance - Comments: CGA   Standing balance support: Bilateral upper extremity supported, During functional activity, Reliant on assistive device for balance Standing balance-Leahy Scale: Zero                             ADL either performed or assessed with clinical judgement   ADL Overall ADL's : Needs assistance/impaired Eating/Feeding: Set up;Bed level   Grooming: Wash/dry hands;Wash/dry face;Brushing hair;Sitting;Contact guard assist   Upper Body Bathing: Minimal assistance;Sitting   Lower Body Bathing: Total assistance;+2 for safety/equipment;Sit to/from stand  Upper Body Dressing : Set up;Sitting   Lower Body  Dressing: +2 for physical assistance;Total assistance;Sit to/from stand                 General ADL Comments: adjusted hip abduction splint and floated heels     Vision Ability to See in Adequate Light: 0 Adequate Patient Visual Report: No change from baseline       Perception         Praxis         Pertinent Vitals/Pain Pain Assessment Pain Assessment: Faces Faces Pain Scale: Hurts little more Pain Location: L hip with repositioning Pain Descriptors / Indicators: Discomfort, Aching, Grimacing Pain Intervention(s): Monitored during session, Repositioned     Extremity/Trunk Assessment Upper Extremity Assessment RUE Deficits / Details: limited shoulder ROM, longstanding LUE Deficits / Details: limited shoulder ROM, longstanding   Lower Extremity Assessment Lower Extremity Assessment: Defer to PT evaluation   Cervical / Trunk Assessment Cervical / Trunk Assessment: Kyphotic   Communication Communication Communication: Impaired Factors Affecting Communication: Hearing impaired   Cognition Arousal: Alert Behavior During Therapy: WFL for tasks assessed/performed Cognition: History of cognitive impairments                               Following commands: Impaired Following commands impaired: Follows one step commands with increased time     Cueing  General Comments   Cueing Techniques: Verbal cues;Gestural cues      Exercises     Shoulder Instructions      Home Living Family/patient expects to be discharged to:: Skilled nursing facility Living Arrangements: Children                                      Prior Functioning/Environment Prior Level of Function : Patient poor historian/Family not available;History of Falls (last six months)                    OT Problem List: Decreased strength;Decreased activity tolerance;Impaired balance (sitting and/or standing);Decreased knowledge of use of DME or AE;Decreased  knowledge of precautions;Pain   OT Treatment/Interventions: Self-care/ADL training;Therapeutic exercise;DME and/or AE instruction;Therapeutic activities;Patient/family education;Balance training      OT Goals(Current goals can be found in the care plan section)   Acute Rehab OT Goals OT Goal Formulation: With patient Time For Goal Achievement: 01/04/24 Potential to Achieve Goals: Fair ADL Goals Pt Will Transfer to Toilet: with mod assist;stand pivot transfer;bedside commode Additional ADL Goal #1: Pt will complete bed mobility with moderate assistance in preparation for ADLs.   OT Frequency:  Min 2X/week    Co-evaluation              AM-PAC OT 6 Clicks Daily Activity     Outcome Measure Help from another person eating meals?: A Little Help from another person taking care of personal grooming?: A Little Help from another person toileting, which includes using toliet, bedpan, or urinal?: Total Help from another person bathing (including washing, rinsing, drying)?: A Lot Help from another person to put on and taking off regular upper body clothing?: A Little Help from another person to put on and taking off regular lower body clothing?: Total 6 Click Score: 13   End of Session Equipment Utilized During Treatment: Gait belt Nurse Communication: Mobility status;Other (comment) (LE positioning in bed, float heels)  Activity Tolerance: Patient  tolerated treatment well Patient left: in bed;with call bell/phone within reach;with bed alarm set  OT Visit Diagnosis: Unsteadiness on feet (R26.81);Muscle weakness (generalized) (M62.81);Pain Pain - Right/Left: Left Pain - part of body: Hip                Time: 9046-8982 OT Time Calculation (min): 24 min Charges:  OT General Charges $OT Visit: 1 Visit OT Evaluation $OT Eval Moderate Complexity: 1 Mod OT Treatments $Self Care/Home Management : 8-22 mins  Mliss HERO, OTR/L Acute Rehabilitation Services Office: [phone]    Kennth Mliss Helling 12/21/2023, 11:31 AM

## 2023-12-22 ENCOUNTER — Observation Stay (HOSPITAL_COMMUNITY)

## 2023-12-22 DIAGNOSIS — E1169 Type 2 diabetes mellitus with other specified complication: Secondary | ICD-10-CM | POA: Diagnosis present

## 2023-12-22 DIAGNOSIS — W19XXXA Unspecified fall, initial encounter: Secondary | ICD-10-CM | POA: Diagnosis present

## 2023-12-22 DIAGNOSIS — K5730 Diverticulosis of large intestine without perforation or abscess without bleeding: Secondary | ICD-10-CM | POA: Diagnosis not present

## 2023-12-22 DIAGNOSIS — F0394 Unspecified dementia, unspecified severity, with anxiety: Secondary | ICD-10-CM | POA: Diagnosis present

## 2023-12-22 DIAGNOSIS — I10 Essential (primary) hypertension: Secondary | ICD-10-CM | POA: Diagnosis not present

## 2023-12-22 DIAGNOSIS — E8721 Acute metabolic acidosis: Secondary | ICD-10-CM | POA: Diagnosis present

## 2023-12-22 DIAGNOSIS — Z01818 Encounter for other preprocedural examination: Secondary | ICD-10-CM | POA: Diagnosis not present

## 2023-12-22 DIAGNOSIS — E785 Hyperlipidemia, unspecified: Secondary | ICD-10-CM | POA: Diagnosis present

## 2023-12-22 DIAGNOSIS — E1142 Type 2 diabetes mellitus with diabetic polyneuropathy: Secondary | ICD-10-CM | POA: Diagnosis present

## 2023-12-22 DIAGNOSIS — H9190 Unspecified hearing loss, unspecified ear: Secondary | ICD-10-CM | POA: Diagnosis present

## 2023-12-22 DIAGNOSIS — S73005A Unspecified dislocation of left hip, initial encounter: Secondary | ICD-10-CM | POA: Diagnosis not present

## 2023-12-22 DIAGNOSIS — Z8659 Personal history of other mental and behavioral disorders: Secondary | ICD-10-CM | POA: Diagnosis not present

## 2023-12-22 DIAGNOSIS — N3000 Acute cystitis without hematuria: Secondary | ICD-10-CM | POA: Diagnosis present

## 2023-12-22 DIAGNOSIS — B9689 Other specified bacterial agents as the cause of diseases classified elsewhere: Secondary | ICD-10-CM | POA: Diagnosis present

## 2023-12-22 DIAGNOSIS — E1122 Type 2 diabetes mellitus with diabetic chronic kidney disease: Secondary | ICD-10-CM | POA: Diagnosis not present

## 2023-12-22 DIAGNOSIS — I129 Hypertensive chronic kidney disease with stage 1 through stage 4 chronic kidney disease, or unspecified chronic kidney disease: Secondary | ICD-10-CM | POA: Diagnosis not present

## 2023-12-22 DIAGNOSIS — H5462 Unqualified visual loss, left eye, normal vision right eye: Secondary | ICD-10-CM | POA: Diagnosis present

## 2023-12-22 DIAGNOSIS — T8452XA Infection and inflammatory reaction due to internal left hip prosthesis, initial encounter: Secondary | ICD-10-CM | POA: Diagnosis present

## 2023-12-22 DIAGNOSIS — E860 Dehydration: Secondary | ICD-10-CM | POA: Diagnosis present

## 2023-12-22 DIAGNOSIS — N1832 Chronic kidney disease, stage 3b: Secondary | ICD-10-CM | POA: Diagnosis not present

## 2023-12-22 DIAGNOSIS — Z96642 Presence of left artificial hip joint: Secondary | ICD-10-CM | POA: Diagnosis not present

## 2023-12-22 DIAGNOSIS — E119 Type 2 diabetes mellitus without complications: Secondary | ICD-10-CM | POA: Diagnosis not present

## 2023-12-22 DIAGNOSIS — M00851 Arthritis due to other bacteria, right hip: Secondary | ICD-10-CM | POA: Diagnosis not present

## 2023-12-22 DIAGNOSIS — M7989 Other specified soft tissue disorders: Secondary | ICD-10-CM | POA: Diagnosis not present

## 2023-12-22 DIAGNOSIS — T84021A Dislocation of internal left hip prosthesis, initial encounter: Secondary | ICD-10-CM | POA: Diagnosis not present

## 2023-12-22 DIAGNOSIS — D62 Acute posthemorrhagic anemia: Secondary | ICD-10-CM | POA: Diagnosis not present

## 2023-12-22 DIAGNOSIS — Y792 Prosthetic and other implants, materials and accessory orthopedic devices associated with adverse incidents: Secondary | ICD-10-CM | POA: Diagnosis present

## 2023-12-22 DIAGNOSIS — F411 Generalized anxiety disorder: Secondary | ICD-10-CM | POA: Diagnosis present

## 2023-12-22 DIAGNOSIS — M1612 Unilateral primary osteoarthritis, left hip: Secondary | ICD-10-CM | POA: Diagnosis not present

## 2023-12-22 DIAGNOSIS — Y838 Other surgical procedures as the cause of abnormal reaction of the patient, or of later complication, without mention of misadventure at the time of the procedure: Secondary | ICD-10-CM | POA: Diagnosis present

## 2023-12-22 DIAGNOSIS — T84031A Mechanical loosening of internal left hip prosthetic joint, initial encounter: Secondary | ICD-10-CM | POA: Diagnosis not present

## 2023-12-22 DIAGNOSIS — M25352 Other instability, left hip: Secondary | ICD-10-CM | POA: Diagnosis not present

## 2023-12-22 MED ORDER — CEFAZOLIN SODIUM-DEXTROSE 2-4 GM/100ML-% IV SOLN
2.0000 g | INTRAVENOUS | Status: AC
  Administered 2023-12-23: 2 g via INTRAVENOUS

## 2023-12-22 MED ORDER — POVIDONE-IODINE 10 % EX SWAB
2.0000 | Freq: Once | CUTANEOUS | Status: AC
Start: 2023-12-22 — End: 2023-12-23
  Administered 2023-12-23: 2 via TOPICAL

## 2023-12-22 MED ORDER — CHLORHEXIDINE GLUCONATE 4 % EX SOLN
60.0000 mL | Freq: Once | CUTANEOUS | Status: AC
  Administered 2023-12-22: 4 via TOPICAL

## 2023-12-22 MED ORDER — TRANEXAMIC ACID-NACL 1000-0.7 MG/100ML-% IV SOLN
1000.0000 mg | INTRAVENOUS | Status: AC
  Administered 2023-12-23: 1000 mg via INTRAVENOUS

## 2023-12-22 NOTE — Plan of Care (Signed)

## 2023-12-22 NOTE — H&P (View-Only) (Signed)
 Subjective:  Patient is lying in bed this afternoon with her son at bedside.  Patient has been working with physical therapy but unable to ambulate.  There was concern earlier today that her left leg was internally rotated.  The patient endorses pain in the left hip and groin area.  There has been no new injury or trauma since admission to the hospital.  She has been compliant in wearing the hip abduction orthosis.  Yesterday's total administered Morphine  Milligram Equivalents: 5   Objective:   VITALS:   Vitals:   12/21/23 1608 12/21/23 1929 12/22/23 0419 12/22/23 0457  BP: (!) 152/60 (!) 166/79 (!) 91/39 (!) 122/51  Pulse: 88 87 66 63  Resp: 18 18 18    Temp: 98.7 F (37.1 C) 98.4 F (36.9 C) 97.6 F (36.4 C)   TempSrc: Oral  Oral   SpO2: 98% 99% 96%   Weight:      Height:       Left lower extremity is notably shorter internally rotated compared to the right side.  She has pain with gentle left hip range of motion.  No significant swelling or bruising about the left hip.  Incision is well-healed.  She is able to dorsiflex plantarflex the ankle.  Sensation is intact distally dorsal plantar aspect of the foot.  Foot is warm well-perfused.  Lab Results  Component Value Date   WBC 10.5 12/21/2023   HGB 10.5 (L) 12/21/2023   HCT 32.5 (L) 12/21/2023   MCV 94.8 12/21/2023   PLT 212 12/21/2023   BMET    Component Value Date/Time   NA 135 12/21/2023 0413   NA 146 04/18/2016 0000   K 3.8 12/21/2023 0413   CL 105 12/21/2023 0413   CO2 21 (L) 12/21/2023 0413   GLUCOSE 113 (H) 12/21/2023 0413   BUN 23 12/21/2023 0413   BUN 20 04/18/2016 0000   CREATININE 0.96 12/21/2023 0413   CREATININE 0.91 (H) 02/26/2020 1431   CALCIUM 8.7 (L) 12/21/2023 0413   GFRNONAA 55 (L) 12/21/2023 0413   GFRNONAA 50 (L) 09/14/2016 0800      Xray: Repeat x-rays of the pelvis left hip demonstrated recurrent superior posterior dislocation of the left hip prosthesis  Assessment/Plan:      Principal Problem:   Hip dislocation, left (HCC) Active Problems:   Non-insulin  dependent type 2 diabetes mellitus (HCC)   Essential hypertension   Hyperlipidemia   CKD stage 3b, GFR 30-44 ml/min (HCC)   H/O total hip arthroplasty, left   History of dementia   History of CVA (cerebrovascular accident)   Closed dislocation of left hip (HCC)   Acute cystitis  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  Unlikely that we will continue to be stable with continued nonsurgical treatment.  Patient is hard of hearing and confused likely with some underlying dementia; however son reports that patient was ambulatory prior to her dislocation events.  I think with nonsurgical treatment at this point she would likely remain bedbound which will put her at risk for complications including pressure sores, respiratory issues, deconditioning, and blood clots.  Patient would benefit from a revision of her left hip arthroplasty for stability, pain control, mobility.  Notably her acetabular component appears malposition noted be very vertical this is likely contributing to her instability would plan for acetabular revision first with possible need for femoral component revision depending on her  intra-op stability.  The risks benefits and alternatives were discussed with the patient and her son including but not limited to the risks of nonoperative treatment, versus surgical intervention including infection, bleeding, nerve injury, periprosthetic fracture, the need for revision surgery, dislocation, leg length discrepancy, blood clots, cardiopulmonary complications, morbidity, mortality, among others, and they were willing to proceed.     Patient to be nonweightbearing for now, n.p.o. after midnight with plan for surgery on 12/23/2023.  CT scan left hip ordered for presurgical planning   Aaronmichael Brumbaugh A  Matilda Fleig 12/22/2023, 6:09 AM   Toribio Higashi, MD  Contact information:   [phone] 7am-5pm epic message Dr. Higashi, or call office for patient follow up: [phone] After hours and holidays please check [email or web address] for group call information for [facility]

## 2023-12-22 NOTE — Progress Notes (Addendum)
 Subjective:  Patient is lying in bed this afternoon with her son at bedside.  Patient has been working with physical therapy but unable to ambulate.  There was concern earlier today that her left leg was internally rotated.  The patient endorses pain in the left hip and groin area.  There has been no new injury or trauma since admission to the hospital.  She has been compliant in wearing the hip abduction orthosis.  Yesterday's total administered Morphine  Milligram Equivalents: 5   Objective:   VITALS:   Vitals:   12/21/23 1608 12/21/23 1929 12/22/23 0419 12/22/23 0457  BP: (!) 152/60 (!) 166/79 (!) 91/39 (!) 122/51  Pulse: 88 87 66 63  Resp: 18 18 18    Temp: 98.7 F (37.1 C) 98.4 F (36.9 C) 97.6 F (36.4 C)   TempSrc: Oral  Oral   SpO2: 98% 99% 96%   Weight:      Height:       Left lower extremity is notably shorter internally rotated compared to the right side.  She has pain with gentle left hip range of motion.  No significant swelling or bruising about the left hip.  Incision is well-healed.  She is able to dorsiflex plantarflex the ankle.  Sensation is intact distally dorsal plantar aspect of the foot.  Foot is warm well-perfused.  Lab Results  Component Value Date   WBC 10.5 12/21/2023   HGB 10.5 (L) 12/21/2023   HCT 32.5 (L) 12/21/2023   MCV 94.8 12/21/2023   PLT 212 12/21/2023   BMET    Component Value Date/Time   NA 135 12/21/2023 0413   NA 146 04/18/2016 0000   K 3.8 12/21/2023 0413   CL 105 12/21/2023 0413   CO2 21 (L) 12/21/2023 0413   GLUCOSE 113 (H) 12/21/2023 0413   BUN 23 12/21/2023 0413   BUN 20 04/18/2016 0000   CREATININE 0.96 12/21/2023 0413   CREATININE 0.91 (H) 02/26/2020 1431   CALCIUM 8.7 (L) 12/21/2023 0413   GFRNONAA 55 (L) 12/21/2023 0413   GFRNONAA 50 (L) 09/14/2016 0800      Xray: Repeat x-rays of the pelvis left hip demonstrated recurrent superior posterior dislocation of the left hip prosthesis  Assessment/Plan:      Principal Problem:   Hip dislocation, left (HCC) Active Problems:   Non-insulin  dependent type 2 diabetes mellitus (HCC)   Essential hypertension   Hyperlipidemia   CKD stage 3b, GFR 30-44 ml/min (HCC)   H/O total hip arthroplasty, left   History of dementia   History of CVA (cerebrovascular accident)   Closed dislocation of left hip (HCC)   Acute cystitis  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  Unlikely that we will continue to be stable with continued nonsurgical treatment.  Patient is hard of hearing and confused likely with some underlying dementia; however son reports that patient was ambulatory prior to her dislocation events.  I think with nonsurgical treatment at this point she would likely remain bedbound which will put her at risk for complications including pressure sores, respiratory issues, deconditioning, and blood clots.  Patient would benefit from Martinez revision of her left hip arthroplasty for stability, pain control, mobility.  Notably her acetabular component appears malposition noted be very vertical this is likely contributing to her instability would plan for acetabular revision first with possible need for femoral component revision depending on her  intra-op stability.  The risks benefits and alternatives were discussed with the patient and her son including but not limited to the risks of nonoperative treatment, versus surgical intervention including infection, bleeding, nerve injury, periprosthetic fracture, the need for revision surgery, dislocation, leg length discrepancy, blood clots, cardiopulmonary complications, morbidity, mortality, among others, and they were willing to proceed.     Patient to be nonweightbearing for now, n.p.o. after midnight with plan for surgery on 12/23/2023.  CT scan left hip ordered for presurgical planning   Madison Martinez  Madison Martinez 12/22/2023, 6:09 AM   Madison Higashi, MD  Contact information:   [phone] 7am-5pm epic message Dr. Higashi, or call office for patient follow up: [phone] After hours and holidays please check [email or web address] for group call information for [facility]

## 2023-12-22 NOTE — Progress Notes (Signed)
 Progress Note   Patient: Madison Martinez DOB: 02-07-1928 DOA: 12/18/2023     1 DOS: the patient was seen and examined on 12/22/2023   Brief hospital course:  Madison Martinez is a 89 y.o. female  history of dementia, DM type II, hypertension, hyperlipidemia, CKD 3B, blindness of the left eye, CVA, and recent hip dislocation due to mechanical fall which was reduced in the ED and discharged from the hospital 9/25.  She presented to ED on 9/29 via EMS after a fall with recurrent left hip pain, x-ray at the rehab facility showed recurrent left hip dislocation.  Patient was seen by orthopedics in the ED and the hip was reduced.    Patient was admitted for further evaluation and management as outlined in detail below.   Assessment and Plan:  Left prosthetic hip dislocation - recurrent History of left hip arthroplasty History of recurrent left prosthetic hip dislocation Dislocated hip was reduced in ED by Dr. Ginger.   Repeat x-ray showing reduction of the left hip arthroplasty.  No acute evidence of fracture. --Ortho following, appreciate recommendations: --Hip Abduction orthosis --Posterior hip precautions --Ortho monitoring closely, may require surgical intervention --WBAT with posterior hip precautions --Pt takes Plavix  for prior CVA, last dose was 9/29  --Hold Plavix  --Pain control per orders    History of CVA - Hold Plavix .  Last dose of Plavix  on 9/29   Non-insulin -dependent DM type II -Previously patient was diabetic.  A1c 6.2.  Continue heart healthy carb modified diet   Essential hypertension - BP's just mildly elevated but acceptable for age and in setting of pain -After receiving lisinopril  40 mg patient blood pressure dropped 160 to 110, given IV fluids for dehydration   --Holding lisinopril  and Toprol -XL, resume when BP will tolerate   Hyperlipidemia -Continue Pravachol    History of dementia Generalized anxiety disorder - Continue Seroquel  at bedtime    Acute cystitis - Continue amoxicillin  until 10/2   Hyperlipidemia History of CVA   CKD stage 3B Renal function at baseline. --Monitor     Subjective: Pt was sleeping when seen this AM, wakes briefly to voice.  States her hip is sore.  Denies other complaints.  No acute events reported overnight.   Physical Exam: Vitals:   12/21/23 1929 12/22/23 0419 12/22/23 0457 12/22/23 0727  BP: (!) 166/79 (!) 91/39 (!) 122/51 (!) 142/71  Pulse: 87 66 63 76  Resp: 18 18  16   Temp: 98.4 F (36.9 C) 97.6 F (36.4 C)  97.6 F (36.4 C)  TempSrc:  Oral    SpO2: 99% 96%  98%  Weight:      Height:       General exam: sleeping, wakes to voice no acute distress, pleasantly confused HEENT: moist mucus membranes, hard of hearing  Respiratory system: CTAB, no wheezes, rales or rhonchi, normal respiratory effort. Cardiovascular system: normal S1/S2, RRRedema.   Gastrointestinal system: soft, NT, ND Central nervous system: no gross focal neurologic deficits, normal speech Extremities: left hip abduction brace in place, no significant peripheral edema Skin: dry, intact, normal temperature   Data Reviewed: No new labs today Notable labs 10/2 -- bicarb 18>>21, Cr 1.04 >> 0.96, Ca 8.7, Hbg 10.0>>10.5 stable   Family Communication: none present on rounds, will attempt to call as time allows  Disposition: Status is: Observation  Remains admitted because: Ortho taking to surgery tomorrow for revision of hip arthroplasty due to another re-dislocation.    Planned Discharge Destination: Skilled nursing facility  Time spent: 35 minutes  Author: Burnard DELENA Cunning, DO 12/22/2023 12:42 PM  For on call review [email or web address].

## 2023-12-22 NOTE — Anesthesia Preprocedure Evaluation (Addendum)
 Anesthesia Evaluation  Patient identified by MRN, date of birth, ID band Patient awake    Reviewed: Allergy & Precautions, NPO status , Patient's Chart, lab work & pertinent test results  Airway Mallampati: Unable to assess       Dental  (+) Dental Advisory Given   Pulmonary     + decreased breath sounds      Cardiovascular hypertension,  Rhythm:Regular Rate:Normal  Echo:      TRANSESOPHOGEAL ECHO REPORT       Patient Name:   Madison Martinez Date of Exam: 05/17/2019 Medical Rec #:  [id number]   Height:       64.5 in Accession #:    [id number]  Weight:       143.7 lb Date of Birth:  [birth year]   BSA:          1.709 m Patient Age:    89 years    BP:           156/67 mmHg Patient Gender: F           HR:           69 bpm. Exam Location:  Inpatient  Procedure: Transesophageal Echo  Indications:     Stroke 434.91 / I63.9   History:         Patient has prior history of Echocardiogram examinations, most                  recent 05/15/2019. Risk Factors:Dyslipidemia, Hypertension and                  Diabetes. CVA.   Sonographer:     Rome Eans RDCS (AE) Referring Phys:  [id number] MIKEY DEL FURTH Diagnosing Phys: Redell Shallow MD  PROCEDURE: After discussion of the risks and benefits of a TEE, an informed consent was obtained from the patient. The transesophogeal probe was passed without difficulty through the esophogus of the patient. Sedation performed by different physician. Image quality was adequate. The patient developed no complications during the procedure.  IMPRESSIONS    1. Normal LV function; moderate LAE; trace AI; mild MR and TR.  2. Left ventricular ejection fraction, by estimation, is 55 to 60%. The left ventricle has normal function. The left ventricle has no regional wall motion abnormalities.  3. Right ventricular systolic function is normal. The right ventricular size is normal.  4. Left atrial size was  moderately dilated. No left atrial/left atrial appendage thrombus was detected.  5. The mitral valve is normal in structure and function. Mild mitral valve regurgitation.  6. The aortic valve is tricuspid. Aortic valve regurgitation is trivial. Mild to moderate aortic valve sclerosis/calcification is present, without any evidence of aortic stenosis.  7. There is Moderate (Grade III) plaque involving the descending aorta.  8. Agitated saline contrast bubble study was negative, with no evidence of any interatrial shunt.     Neuro/Psych  Neuromuscular disease CVA  negative psych ROS   GI/Hepatic negative GI ROS, Neg liver ROS,,,  Endo/Other  diabetes    Renal/GU Renal disease     Musculoskeletal negative musculoskeletal ROS (+)    Abdominal   Peds  Hematology negative hematology ROS (+)   Anesthesia Other Findings   Reproductive/Obstetrics                              Anesthesia Physical Anesthesia Plan  ASA: 3  Anesthesia Plan: General   Post-op Pain  Management: Ofirmev  IV (intra-op)*   Induction: Intravenous  PONV Risk Score and Plan: 3 and Ondansetron , Treatment may vary due to age or medical condition and TIVA  Airway Management Planned: Oral ETT  Additional Equipment: None  Intra-op Plan:   Post-operative Plan: Extubation in OR  Informed Consent: I have reviewed the patients History and Physical, chart, labs and discussed the procedure including the risks, benefits and alternatives for the proposed anesthesia with the patient or authorized representative who has indicated his/her understanding and acceptance.       Plan Discussed with: CRNA  Anesthesia Plan Comments:          Anesthesia Quick Evaluation

## 2023-12-23 ENCOUNTER — Other Ambulatory Visit: Payer: Self-pay

## 2023-12-23 ENCOUNTER — Inpatient Hospital Stay (HOSPITAL_COMMUNITY): Payer: Self-pay | Admitting: Anesthesiology

## 2023-12-23 ENCOUNTER — Encounter (HOSPITAL_COMMUNITY): Payer: Self-pay | Admitting: Internal Medicine

## 2023-12-23 ENCOUNTER — Encounter (HOSPITAL_COMMUNITY)
Admission: EM | Disposition: A | Discharge status: Skilled Nursing Facility | Payer: Self-pay | Source: Home / Self Care | Attending: Internal Medicine

## 2023-12-23 ENCOUNTER — Inpatient Hospital Stay (HOSPITAL_COMMUNITY)

## 2023-12-23 DIAGNOSIS — T84021A Dislocation of internal left hip prosthesis, initial encounter: Secondary | ICD-10-CM | POA: Diagnosis not present

## 2023-12-23 DIAGNOSIS — I10 Essential (primary) hypertension: Secondary | ICD-10-CM | POA: Diagnosis not present

## 2023-12-23 DIAGNOSIS — N1832 Chronic kidney disease, stage 3b: Secondary | ICD-10-CM

## 2023-12-23 DIAGNOSIS — S73005A Unspecified dislocation of left hip, initial encounter: Secondary | ICD-10-CM | POA: Diagnosis not present

## 2023-12-23 DIAGNOSIS — E1122 Type 2 diabetes mellitus with diabetic chronic kidney disease: Secondary | ICD-10-CM | POA: Diagnosis not present

## 2023-12-23 DIAGNOSIS — I129 Hypertensive chronic kidney disease with stage 1 through stage 4 chronic kidney disease, or unspecified chronic kidney disease: Secondary | ICD-10-CM | POA: Diagnosis not present

## 2023-12-23 HISTORY — PX: ACETABULAR REVISION: SHX5712

## 2023-12-23 LAB — BASIC METABOLIC PANEL WITH GFR
Anion gap: 10 (ref 5–15)
BUN: 28 mg/dL — ABNORMAL HIGH (ref 8–23)
CO2: 22 mmol/L (ref 22–32)
Calcium: 8.9 mg/dL (ref 8.9–10.3)
Chloride: 103 mmol/L (ref 98–111)
Creatinine, Ser: 1.2 mg/dL — ABNORMAL HIGH (ref 0.44–1.00)
GFR, Estimated: 42 mL/min — ABNORMAL LOW (ref >60)
Glucose, Bld: 188 mg/dL — ABNORMAL HIGH (ref 70–99)
Potassium: 4.9 mmol/L (ref 3.5–5.1)
Sodium: 135 mmol/L (ref 135–145)

## 2023-12-23 LAB — CBC
HCT: 31.3 % — ABNORMAL LOW (ref 36.0–46.0)
Hemoglobin: 10 g/dL — ABNORMAL LOW (ref 12.0–15.0)
MCH: 30.9 pg (ref 26.0–34.0)
MCHC: 31.9 g/dL (ref 30.0–36.0)
MCV: 96.6 fL (ref 80.0–100.0)
Platelets: 224 K/uL (ref 150–400)
RBC: 3.24 MIL/uL — ABNORMAL LOW (ref 3.87–5.11)
RDW: 12.9 % (ref 11.5–15.5)
WBC: 13.4 K/uL — ABNORMAL HIGH (ref 4.0–10.5)
nRBC: 0 % (ref 0.0–0.2)

## 2023-12-23 LAB — GLUCOSE, CAPILLARY
Glucose-Capillary: 117 mg/dL — ABNORMAL HIGH (ref 70–99)
Glucose-Capillary: 169 mg/dL — ABNORMAL HIGH (ref 70–99)

## 2023-12-23 LAB — TYPE AND SCREEN
ABO/RH(D): O POS
Antibody Screen: NEGATIVE

## 2023-12-23 SURGERY — REVISION, TOTAL ARTHROPLASTY, HIP, ACETABULAR COMPONENT
Anesthesia: General | Site: Hip | Laterality: Left

## 2023-12-23 MED ORDER — CEFAZOLIN SODIUM-DEXTROSE 2-4 GM/100ML-% IV SOLN
2.0000 g | Freq: Two times a day (BID) | INTRAVENOUS | Status: AC
  Administered 2023-12-23 – 2023-12-26 (×6): 2 g via INTRAVENOUS
  Filled 2023-12-23 (×6): qty 100

## 2023-12-23 MED ORDER — FENTANYL CITRATE (PF) 250 MCG/5ML IJ SOLN
INTRAMUSCULAR | Status: DC | PRN
  Administered 2023-12-23: 50 ug via INTRAVENOUS
  Administered 2023-12-23 (×2): 25 ug via INTRAVENOUS

## 2023-12-23 MED ORDER — ORAL CARE MOUTH RINSE: 15.0000 mL | Freq: Once | OROMUCOSAL | Status: AC

## 2023-12-23 MED ORDER — EPHEDRINE 5 MG/ML INJ
INTRAVENOUS | Status: AC
  Filled 2023-12-23: qty 5

## 2023-12-23 MED ORDER — CEFAZOLIN SODIUM-DEXTROSE 2-4 GM/100ML-% IV SOLN
2.0000 g | Freq: Three times a day (TID) | INTRAVENOUS | Status: DC
  Administered 2023-12-23: 2 g via INTRAVENOUS
  Filled 2023-12-23: qty 100

## 2023-12-23 MED ORDER — DEXAMETHASONE SODIUM PHOSPHATE 10 MG/ML IJ SOLN
INTRAMUSCULAR | Status: DC | PRN
  Administered 2023-12-23: 4 mg via INTRAVENOUS

## 2023-12-23 MED ORDER — SODIUM CHLORIDE 0.9 % IV SOLN
INTRAVENOUS | Status: AC
Start: 2023-12-23 — End: 2023-12-24

## 2023-12-23 MED ORDER — SODIUM CHLORIDE (PF) 0.9 % IJ SOLN
INTRAMUSCULAR | Status: AC
  Filled 2023-12-23: qty 50

## 2023-12-23 MED ORDER — PROPOFOL 10 MG/ML IV BOLUS
INTRAVENOUS | Status: AC
  Filled 2023-12-23: qty 20

## 2023-12-23 MED ORDER — ONDANSETRON HCL 4 MG/2ML IJ SOLN
INTRAMUSCULAR | Status: AC
  Filled 2023-12-23: qty 2

## 2023-12-23 MED ORDER — CLOPIDOGREL BISULFATE 75 MG PO TABS
75.0000 mg | ORAL_TABLET | Freq: Every day | ORAL | Status: DC
  Administered 2023-12-25 – 2023-12-29 (×5): 75 mg via ORAL
  Filled 2023-12-23 (×5): qty 1

## 2023-12-23 MED ORDER — INSULIN ASPART 100 UNIT/ML IJ SOLN: 0.0000 [IU] | INTRAMUSCULAR | Status: DC | PRN

## 2023-12-23 MED ORDER — ACETAMINOPHEN 10 MG/ML IV SOLN
INTRAVENOUS | Status: AC
  Filled 2023-12-23: qty 100

## 2023-12-23 MED ORDER — CEFAZOLIN SODIUM-DEXTROSE 2-4 GM/100ML-% IV SOLN
INTRAVENOUS | Status: AC
  Filled 2023-12-23: qty 100

## 2023-12-23 MED ORDER — FENTANYL CITRATE (PF) 250 MCG/5ML IJ SOLN
INTRAMUSCULAR | Status: AC
  Filled 2023-12-23: qty 5

## 2023-12-23 MED ORDER — ROCURONIUM BROMIDE 10 MG/ML (PF) SYRINGE
PREFILLED_SYRINGE | INTRAVENOUS | Status: DC | PRN
  Administered 2023-12-23: 40 mg via INTRAVENOUS
  Administered 2023-12-23 (×3): 10 mg via INTRAVENOUS

## 2023-12-23 MED ORDER — BUPIVACAINE LIPOSOME 1.3 % IJ SUSP
INTRAMUSCULAR | Status: AC
Start: 2023-12-23 — End: 2023-12-23
  Filled 2023-12-23: qty 20

## 2023-12-23 MED ORDER — PHENYLEPHRINE HCL-NACL 20-0.9 MG/250ML-% IV SOLN
INTRAVENOUS | Status: DC | PRN
  Administered 2023-12-23: 20 ug/min via INTRAVENOUS

## 2023-12-23 MED ORDER — SODIUM CHLORIDE (PF) 0.9 % IJ SOLN
INTRAMUSCULAR | Status: DC | PRN
  Administered 2023-12-23: 30 mL

## 2023-12-23 MED ORDER — ACETAMINOPHEN 10 MG/ML IV SOLN
1000.0000 mg | Freq: Once | INTRAVENOUS | Status: AC
  Administered 2023-12-23: 1000 mg via INTRAVENOUS

## 2023-12-23 MED ORDER — FENTANYL CITRATE (PF) 100 MCG/2ML IJ SOLN
INTRAMUSCULAR | Status: AC
  Filled 2023-12-23: qty 2

## 2023-12-23 MED ORDER — LIDOCAINE 2% (20 MG/ML) 5 ML SYRINGE
INTRAMUSCULAR | Status: DC | PRN
  Administered 2023-12-23: 60 mg via INTRAVENOUS

## 2023-12-23 MED ORDER — SODIUM CHLORIDE 0.9 % IR SOLN
Status: DC | PRN
  Administered 2023-12-23: 1500 mL

## 2023-12-23 MED ORDER — CHLORHEXIDINE GLUCONATE 0.12 % MT SOLN
15.0000 mL | Freq: Once | OROMUCOSAL | Status: AC
  Administered 2023-12-23: 15 mL via OROMUCOSAL

## 2023-12-23 MED ORDER — WATER FOR IRRIGATION, STERILE IR SOLN
Status: DC | PRN
  Administered 2023-12-23: 1000 mL

## 2023-12-23 MED ORDER — LACTATED RINGERS IV SOLN: INTRAVENOUS | Status: DC

## 2023-12-23 MED ORDER — TRANEXAMIC ACID-NACL 1000-0.7 MG/100ML-% IV SOLN
INTRAVENOUS | Status: AC
  Filled 2023-12-23: qty 100

## 2023-12-23 MED ORDER — BUPIVACAINE LIPOSOME 1.3 % IJ SUSP
INTRAMUSCULAR | Status: DC | PRN
  Administered 2023-12-23: 20 mL

## 2023-12-23 MED ORDER — ASPIRIN 81 MG PO TBEC
81.0000 mg | DELAYED_RELEASE_TABLET | Freq: Two times a day (BID) | ORAL | Status: DC
  Administered 2023-12-24 – 2023-12-29 (×12): 81 mg via ORAL
  Filled 2023-12-23 (×12): qty 1

## 2023-12-23 MED ORDER — LIDOCAINE 2% (20 MG/ML) 5 ML SYRINGE
INTRAMUSCULAR | Status: AC
  Filled 2023-12-23: qty 5

## 2023-12-23 MED ORDER — ROCURONIUM BROMIDE 10 MG/ML (PF) SYRINGE
PREFILLED_SYRINGE | INTRAVENOUS | Status: AC
  Filled 2023-12-23: qty 10

## 2023-12-23 MED ORDER — ASPIRIN 81 MG PO TBEC: 81.0000 mg | DELAYED_RELEASE_TABLET | Freq: Two times a day (BID) | ORAL | Status: DC

## 2023-12-23 MED ORDER — PHENYLEPHRINE 80 MCG/ML (10ML) SYRINGE FOR IV PUSH (FOR BLOOD PRESSURE SUPPORT)
PREFILLED_SYRINGE | INTRAVENOUS | Status: DC | PRN
  Administered 2023-12-23 (×2): 160 ug via INTRAVENOUS

## 2023-12-23 MED ORDER — ACETAMINOPHEN 10 MG/ML IV SOLN: 1000.0000 mg | Freq: Four times a day (QID) | INTRAVENOUS | Status: DC

## 2023-12-23 MED ORDER — BUPIVACAINE-EPINEPHRINE (PF) 0.25% -1:200000 IJ SOLN
INTRAMUSCULAR | Status: DC | PRN
  Administered 2023-12-23: 30 mL

## 2023-12-23 MED ORDER — BUPIVACAINE-EPINEPHRINE (PF) 0.25% -1:200000 IJ SOLN
INTRAMUSCULAR | Status: AC
  Filled 2023-12-23: qty 30

## 2023-12-23 MED ORDER — IRRISEPT - 450ML BOTTLE WITH 0.05% CHG IN STERILE WATER, USP 99.95% OPTIME
TOPICAL | Status: DC | PRN
  Administered 2023-12-23: 450 mL

## 2023-12-23 MED ORDER — PROPOFOL 10 MG/ML IV BOLUS
INTRAVENOUS | Status: DC | PRN
  Administered 2023-12-23: 40 mg via INTRAVENOUS

## 2023-12-23 MED ORDER — PHENYLEPHRINE 80 MCG/ML (10ML) SYRINGE FOR IV PUSH (FOR BLOOD PRESSURE SUPPORT)
PREFILLED_SYRINGE | INTRAVENOUS | Status: AC
  Filled 2023-12-23: qty 10

## 2023-12-23 MED ORDER — SUGAMMADEX SODIUM 200 MG/2ML IV SOLN
INTRAVENOUS | Status: DC | PRN
  Administered 2023-12-23: 200 mg via INTRAVENOUS

## 2023-12-23 MED ORDER — ONDANSETRON HCL 4 MG/2ML IJ SOLN
INTRAMUSCULAR | Status: DC | PRN
  Administered 2023-12-23: 4 mg via INTRAVENOUS

## 2023-12-23 MED ORDER — CHLORHEXIDINE GLUCONATE 0.12 % MT SOLN
OROMUCOSAL | Status: AC
  Filled 2023-12-23: qty 15

## 2023-12-23 MED ORDER — 0.9 % SODIUM CHLORIDE (POUR BTL) OPTIME
TOPICAL | Status: DC | PRN
  Administered 2023-12-23: 1000 mL

## 2023-12-23 MED ORDER — FENTANYL CITRATE (PF) 100 MCG/2ML IJ SOLN
25.0000 ug | INTRAMUSCULAR | Status: DC | PRN
  Administered 2023-12-23: 25 ug via INTRAVENOUS

## 2023-12-23 MED ORDER — DEXAMETHASONE SODIUM PHOSPHATE 10 MG/ML IJ SOLN
INTRAMUSCULAR | Status: AC
  Filled 2023-12-23: qty 1

## 2023-12-23 SURGICAL SUPPLY — 51 items
CANISTER WOUND CARE 500ML ATS (WOUND CARE) IMPLANT
CHLORAPREP W/TINT 26 (MISCELLANEOUS) ×2 IMPLANT
CNTNR URN SCR LID CUP LEK RST (MISCELLANEOUS) IMPLANT
COVER SURGICAL LIGHT HANDLE (MISCELLANEOUS) ×1 IMPLANT
DERMABOND ADVANCED .7 DNX12 (GAUZE/BANDAGES/DRESSINGS) ×1 IMPLANT
DRAPE HALF SHEET 40X57 (DRAPES) ×1 IMPLANT
DRAPE HIP W/POCKET STRL (MISCELLANEOUS) ×1 IMPLANT
DRAPE INCISE IOBAN 66X45 STRL (DRAPES) IMPLANT
DRAPE INCISE IOBAN 85X60 (DRAPES) ×1 IMPLANT
DRAPE POUCH INSTRU U-SHP 10X18 (DRAPES) ×1 IMPLANT
DRAPE U-SHAPE 47X51 STRL (DRAPES) ×2 IMPLANT
DRESSING PEEL AND PLAC PRVNA20 (GAUZE/BANDAGES/DRESSINGS) IMPLANT
DRSG AQUACEL AG ADV 3.5X10 (GAUZE/BANDAGES/DRESSINGS) ×1 IMPLANT
ELECT REM PT RETURN 15FT ADLT (MISCELLANEOUS) ×1 IMPLANT
ELECTRODE BLDE 4.0 EZ CLN MEGD (MISCELLANEOUS) ×1 IMPLANT
GLOVE BIOGEL PI IND STRL 8 (GLOVE) ×1 IMPLANT
GLOVE SURG ORTHO 8.0 STRL STRW (GLOVE) IMPLANT
GOWN STRL REUS W/ TWL XL LVL3 (GOWN DISPOSABLE) ×1 IMPLANT
HEAD FEMORAL C TAPER 36M PL7.5 (Head) IMPLANT
HOOD PEEL AWAY T7 (MISCELLANEOUS) ×3 IMPLANT
KIT BASIN OR (CUSTOM PROCEDURE TRAY) ×1 IMPLANT
KIT TURNOVER KIT B (KITS) ×1 IMPLANT
LAVAGE JET IRRISEPT WOUND (IRRIGATION / IRRIGATOR) IMPLANT
LINER ECCENTRIC TRI X3 4 36 0D (Liner) IMPLANT
MANIFOLD NEPTUNE II (INSTRUMENTS) ×1 IMPLANT
MARKER SKIN DUAL TIP RULER LAB (MISCELLANEOUS) ×1 IMPLANT
PACK TOTAL JOINT (CUSTOM PROCEDURE TRAY) ×1 IMPLANT
PILLOW ABDUCTION MEDIUM (MISCELLANEOUS) IMPLANT
RETRIEVER SUT HEWSON (MISCELLANEOUS) ×1 IMPLANT
SCREW HEX LP 6.5X20 (Screw) IMPLANT
SCREW HEX LP 6.5X30 (Screw) IMPLANT
SCREW HEX LP 6.5X35 (Screw) IMPLANT
SET INTERPULSE LAVAGE W/TIP (ORTHOPEDIC DISPOSABLE SUPPLIES) ×1 IMPLANT
SHELL MULTIHOLE ACETAB F 56 (Miscellaneous) IMPLANT
SOLN 0.9% NACL 1000 ML (IV SOLUTION) ×1 IMPLANT
SOLN 0.9% NACL POUR BTL 1000ML (IV SOLUTION) ×1 IMPLANT
SOLN STERILE WATER 1000 ML (IV SOLUTION) ×1 IMPLANT
SOLN STERILE WATER BTL 1000 ML (IV SOLUTION) ×1 IMPLANT
SUCTION TUBE FRAZIER 10FR DISP (SUCTIONS) ×1 IMPLANT
SUT ETHIBOND 2 V 37 (SUTURE) IMPLANT
SUT MNCRL AB 3-0 PS2 18 (SUTURE) ×1 IMPLANT
SUT NYLON 3 0 (SUTURE) IMPLANT
SUT STRATAFIX 1PDS 45CM VIOLET (SUTURE) ×2 IMPLANT
SUT VIC AB 0 CT1 27XBRD ANBCTR (SUTURE) ×1 IMPLANT
SUT VIC AB 2-0 CT2 27 (SUTURE) ×2 IMPLANT
SUTURE STRATFX 0 PDS 27 VIOLET (SUTURE) IMPLANT
SYR 20ML LL LF (SYRINGE) ×1 IMPLANT
SYR 50ML LL SCALE MARK (SYRINGE) ×1 IMPLANT
TOWEL GREEN STERILE (TOWEL DISPOSABLE) ×1 IMPLANT
TRAY FOLEY MTR SLVR 14FR STAT (SET/KITS/TRAYS/PACK) IMPLANT
TUBE SUCT ARGYLE STRL (TUBING) ×1 IMPLANT

## 2023-12-23 NOTE — Progress Notes (Signed)
  Progress Note   Patient: Madison Martinez FMW:[id number] DOB: March 16, 1928 DOA: 12/18/2023     2 DOS: the patient was seen and examined on 12/23/2023   Brief hospital course:  Madison Martinez is a 89 y.o. female  history of dementia, DM type II, hypertension, hyperlipidemia, CKD 3B, blindness of the left eye, CVA, and recent hip dislocation due to mechanical fall which was reduced in the ED and discharged from the hospital 9/25.  She presented to ED on 9/29 via EMS after a fall with recurrent left hip pain, x-ray at the rehab facility showed recurrent left hip dislocation.  Patient was seen by orthopedics in the ED and the hip was reduced.    Patient was admitted for further evaluation and management as outlined in detail below.   Assessment and Plan:  Left prosthetic hip dislocation - recurrent History of left hip arthroplasty History of recurrent left prosthetic hip dislocation Dislocated hip was reduced in ED by Dr. Ginger.   Repeat x-ray showing reduction of the left hip arthroplasty.  No acute evidence of fracture. --Ortho following, appreciate recommendations: --Going to OR today --Follow post-op recommendations from Ortho --Pt takes Plavix  for prior CVA, last dose was 9/29  --Hold Plavix  --Pain control per orders    History of CVA - Hold Plavix .  Last dose of Plavix  on 9/29   Non-insulin -dependent DM type II -Previously patient was diabetic.  A1c 6.2.  Continue heart healthy carb modified diet   Essential hypertension - BP's just mildly elevated but acceptable for age and in setting of pain BP's controlled overall, but labile. --Holding lisinopril  and Toprol -XL, resume when BP will tolerate   Hyperlipidemia -Continue Pravachol    History of dementia Generalized anxiety disorder - Continue Seroquel  at bedtime   Acute cystitis - Continue amoxicillin  until 10/2   Hyperlipidemia History of CVA   CKD stage 3B Renal function at baseline. --Monitor     Subjective: Pt was  sleeping this AM on rounds.  Taken to OR today due to re-dislocated hip.  No acute events reported overnight.   Physical Exam: Vitals:   12/23/23 1200 12/23/23 1215 12/23/23 1230 12/23/23 1344  BP: (!) 120/54 (!) 130/54 (!) 125/56 120/61  Pulse: 69 69 66 70  Resp: 11 (!) 8 10 18   Temp:   97.6 F (36.4 C) (!) 97.5 F (36.4 C)  TempSrc:    Oral  SpO2: 93% 93% 97% 100%  Weight:      Height:       General exam: sleeping, no acute distress Respiratory system: CTAB, no wheezes, rales or rhonchi, normal respiratory effort. Cardiovascular system: normal S1/S2, RRR Gastrointestinal system: soft, NT, ND Central nervous system: no gross focal neurologic deficits, somnolent Extremities: left hip abduction brace in place, no significant peripheral edema Skin: dry, intact, normal temperature   Data Reviewed: Notable labs - glucose 188, BUN 28, Cr 1.20, WBC 13.4, Hbg 10.0   Family Communication: none present on rounds, will attempt to call as time allows  Disposition: Status is: Inpatient  Remains admitted because: Underwent surgery today    Planned Discharge Destination: Skilled nursing facility    Time spent: 35 minutes  Author: Burnard DELENA Cunning, DO 12/23/2023 4:34 PM  For on call review [email or web address].

## 2023-12-23 NOTE — Progress Notes (Signed)
 PHARMACY NOTE:  ANTIMICROBIAL RENAL DOSAGE ADJUSTMENT  Current antimicrobial regimen includes a mismatch between antimicrobial dosage and estimated renal function.  As per policy approved by the Pharmacy & Therapeutics and Medical Executive Committees, the antimicrobial dosage will be adjusted accordingly.  Current antimicrobial dosage:  ancef 2 g q8h   Indication: surgical prophylaxis   Renal Function:  Estimated Creatinine Clearance: 24.8 mL/min (A) (by C-G formula based on SCr of 1.2 mg/dL (H)). []      On intermittent HD, scheduled: []      On CRRT    Antimicrobial dosage has been changed to:   ancef 2 g q12h, stop time retained (last dose 10/7 1000)  Additional comments:   Thank you for allowing pharmacy to be a part of this patient's care.  Rankin Sams, [facility] 12/23/2023 3:17 PM

## 2023-12-23 NOTE — Transfer of Care (Signed)
 Immediate Anesthesia Transfer of Care Note  Patient: Madison Martinez  Procedure(s) Performed: REVISION, TOTAL ARTHROPLASTY, HIP, ACETABULAR COMPONENT (Left: Hip)  Patient Location: PACU  Anesthesia Type:General  Level of Consciousness: drowsy  Airway & Oxygen Therapy: Patient Spontanous Breathing and Patient connected to face mask oxygen  Post-op Assessment: Report given to RN and Post -op Vital signs reviewed and stable  Post vital signs: Reviewed and stable  Last Vitals:  Vitals Value Taken Time  BP 130/61 12/23/23 11:30  Temp    Pulse 72 12/23/23 11:39  Resp 12 12/23/23 11:39  SpO2 98 % 12/23/23 11:39  Vitals shown include unfiled device data.  Last Pain:  Vitals:   12/23/23 0408  TempSrc: Oral  PainSc:       Patients Stated Pain Goal: 0 (12/22/23 1656)  Complications: No notable events documented.

## 2023-12-23 NOTE — Interval H&P Note (Signed)
 The patient has been re-examined, and the chart reviewed, and there have been no interval changes to the documented history and physical.    Plan for left hip revision acetabular vs  both component revision  The operative side was examined and the patient was confirmed to have sensation to DPN, SPN, TN intact, Motor EHL, ext, flex 5/5, and DP 2+, PT 2+, No significant edema.   The risks, benefits, and alternatives have been discussed at length with patient and her son who was called this morning, and the patient is willing to proceed.  Left hip marked. Consent has been signed.

## 2023-12-23 NOTE — Progress Notes (Signed)
 Patient in PACU from OR with wound vac alarming blockage alert. No blockage found, patient repositioned, and wound vac reset, but still alarming. Bernarda Mclean, PA notifed and at bedside. Mclean, PA noted that wound vac still had a good seal with no blockage so was okay to continue wound vac therapy despite alarm.

## 2023-12-23 NOTE — Plan of Care (Signed)
   Problem: Education: Goal: Knowledge of General Education information will improve Description: Including pain rating scale, medication(s)/side effects and non-pharmacologic comfort measures Outcome: Progressing   Problem: Activity: Goal: Risk for activity intolerance will decrease Outcome: Progressing   Problem: Nutrition: Goal: Adequate nutrition will be maintained Outcome: Progressing

## 2023-12-23 NOTE — Op Note (Signed)
 12/18/2023 - 12/23/2023  10:53 AM  PATIENT:  Madison Martinez   MRN: [id number]  PRE-OPERATIVE DIAGNOSIS: Chronic left hip instability after total of arthroplasty done 20 years ago  POST-OPERATIVE DIAGNOSIS:  same  PROCEDURE: Left hip arthroplasty revision acetabular component and head ball  PREOPERATIVE INDICATIONS:    Madison Martinez is an 89 y.o. female who had undergone left hip arthroplasty 20 years ago in [place] .  She had done well after surgery for many years.  In the past 2 weeks she has had 3 dislocation events 1 after a trial of the abduction brace while in the hospital.  Notably her acetabular component was very vertical which predisposed her to continued instability.  Felt at this point given the failure of conservative treatment she would benefit from revision of her total of arthroplasty acetabular component and possible femoral component. The risks benefits and alternatives were discussed with the patient including but not limited to the risks of nonoperative treatment, versus surgical intervention including infection, bleeding, nerve injury, periprosthetic fracture, the need for revision surgery, dislocation, leg length discrepancy, blood clots, cardiopulmonary complications, morbidity, mortality, among others, and they were willing to proceed.     OPERATIVE REPORT     SURGEON:  Toribio Higashi, MD    ASSISTANT: Bernarda Mclean, PA-C, (Present throughout the entire procedure,  necessary for completion of procedure in a timely manner, assisting with retraction, instrumentation, and closure)     ANESTHESIA: General  ESTIMATED BLOOD LOSS: 300cc    COMPLICATIONS:  None.     UNIQUE ASPECTS OF THE CASE: Very vertical acetabular component that was well-fixed, significant superior lateral poly wear leading to significant instability, femoral component was well-fixed and appropriately anteverted  COMPONENTS:   56 mm multihole acetabular shell, 6.5 hex screws x 3, 6 mm offset X.3  polyethylene lateralized liner, 36+7.5 C taper cobalt chrome femoral head Implant Name Type Inv. Item Serial No. Manufacturer Lot No. LRB No. Used Action  SHELL MULTIHOLE ACETAB F 56 - [id number] Miscellaneous SHELL MULTIHOLE ACETAB F 56  [employer] [id number] Left 1 Implanted  SCREW HEX LP 6.5X30 - [id number] Screw SCREW HEX LP 6.5X30  [employer] [id number] Left 1 Implanted  SCREW HEX LP 6.5X35 - [id number] Screw SCREW HEX LP 6.5X35  [employer] [id number] Left 1 Implanted  SCREW HEX LP 6.5X20 - [id number] Screw SCREW HEX LP 6.5X20  [employer] [id number] Left 1 Implanted  LINER ECCENTRIC TRI X3 4 36 0D - [id number] Liner LINER ECCENTRIC TRI X3 4 36 0D  [employer] [id number] Left 1 Implanted  HEAD FEMORAL C TAPER 82M PL7.5 - [id number] Head HEAD FEMORAL C TAPER 82M PL7.5  [employer] [id number] Left 1 Implanted       PROCEDURE IN DETAIL:   The patient was met in the holding area and  identified.  The appropriate hip was identified and marked at the operative site.  The patient was then transported to the OR  and  placed under anesthesia.  At that point, the patient was  placed in the lateral decubitus position with the operative side up and  secured to the operating room table  and all bony prominences padded. A subaxillary role was also placed.    The operative lower extremity was prepped from the iliac crest to the distal leg.  Sterile draping was performed.  Preoperative antibiotics, 2 gm of ancef,1 gm of Tranexamic Acid, and 8 mg of Decadron  administered. Time out was performed prior to incision.  A routine posterolateral approach was utilized via sharp dissection utilizing the patient's prior incision carried down to the subcutaneous tissue.  Gross bleeders were Bovie coagulated.  The iliotibial band was identified and incised along the length of the skin incision through the glute max fascia.  Charnley retractor was placed with care to protect the sciatic nerve  posteriorly.  With the hip internally rotated,   A capsulotomy was then performed off the femoral insertion and also tagged with a #5 Ethibond.    The hip was dislocated noted be severely unstable there was significant superior lateral liner wear.  The femoral component was appropriately anteverted and well-fixed.  Using a bone tamp was able to remove the head ball.  Was confirmed to be a C taper with a +7.5 mm length.  It was a 28 mm diameter.    I then exposed the deep acetabulum.  The acetabular component was noted to be very vertical and appropriately anteverted.  I plan to match the patient's existing anteversion but provide additional inclination.  Using a osteotome was able to remove the old liner so that we could remove a single screw in the posterior lateral quadrant.  Once this was removed the trial liner was inserted and a cup cutter was utilized.  The cup was a 60 mm PSL cup.  Following circumferential loosening of the shell we then screwed in the central cup impactor and we were able to remove the old cup.     After adequate visualization, I excised the labrum.  I then started reaming with a 50 mm reamer, first medializing to the floor of the cotyloid fossa, and then in the position of the cup aiming towards the greater sciatic notch, matching the version of the transverse acetabular ligament and tucked under the anterior wall. I reamed up to 55 mm reamer with good bony bed preparation and a 56 mm cup was chosen.  The real cup was then impacted into place.  Appropriate version and inclination was confirmed clinically matching their bony anatomy, and also with the use of the jig.  I placed 3 screws in the posterior superior quadrant to augment fixation.  A neutral 36 mm trail liner was placed and impacted.      I then trialed various head ball options.  Felt that the +10 head ball provided the best stability.  We did not have an offset liner trial but felt that this would improve the patient's  length and stability. With a +10 mm trial head ball.  There was no impingement with full extension and 90 degrees external rotation.  The hip was stable at the position of sleep and with 90 degrees flexion and 80degrees of internal rotation.  Leg lengths were also clinically assessed in the lateral position and felt to be equal. Intra-Op fluoroscopy confirmed appropriate position of the components.  A lateralized +6 mm liner was opened and impacted into place.  We then again trialed various head ball options and felt that the +7.5 mm head ball provided appropriate length and stability.  The posterior capsule was then closed with #2 Ethibond.    I then irrigated the hip copiously with dilute Betadine and with normal saline pulse lavage. Periarticular injection was then performed with Exparel.   We repaired the fascia #1 barbed suture, followed by 0 barbed suture for the subcutaneous fat.  Skin was closed with 2-0 Vicryl and 3-0 nylon.  Prevena incisional wound VAC was applied.   Leg lengths in the supine  position were assessed and felt to be clinically equal. There were no complications.  Post op recs: WB: 20% PWB LLE, posterior hip precautions with hip abduction pillow when in bed Abx: ancef while in house, follow-up IntraOp cultures, discharged on extend antibiotic prophylaxis Imaging: PACU pelvis Xray Dressing: Prevena incisional wound VAC to stay on for 1 week DVT prophylaxis: Aspirin  81BID starting POD1 and resume Plavix  postop Greener 2 Follow up: 2 weeks after surgery for a wound check with Dr. Edna at [facility].  Address: [address], [place], [place] [address]  Office Phone: [phone]   Toribio Edna, MD Orthopedic Surgeon

## 2023-12-23 NOTE — Anesthesia Postprocedure Evaluation (Signed)
 Anesthesia Post Note  Patient: Madison Martinez  Procedure(s) Performed: REVISION, TOTAL ARTHROPLASTY, HIP, ACETABULAR COMPONENT (Left: Hip)     Patient location during evaluation: PACU Anesthesia Type: General Level of consciousness: awake and alert Pain management: pain level controlled Vital Signs Assessment: post-procedure vital signs reviewed and stable Respiratory status: spontaneous breathing, nonlabored ventilation, respiratory function stable and patient connected to nasal cannula oxygen Cardiovascular status: blood pressure returned to baseline and stable Postop Assessment: no apparent nausea or vomiting Anesthetic complications: no   No notable events documented.  Last Vitals:  Vitals:   12/23/23 1230 12/23/23 1344  BP: (!) 125/56 120/61  Pulse: 66 70  Resp: 10 18  Temp: 36.4 C (!) 36.4 C  SpO2: 97% 100%    Last Pain:  Vitals:   12/23/23 1344  TempSrc: Oral  PainSc:                  Latrenda Irani D Azile Minardi

## 2023-12-23 NOTE — Anesthesia Procedure Notes (Addendum)
 Procedure Name: Intubation Date/Time: 12/23/2023 8:08 AM  Performed by: Roslynn Waddell LABOR, CRNAPre-anesthesia Checklist: Patient identified, Emergency Drugs available, Suction available and Patient being monitored Patient Re-evaluated:Patient Re-evaluated prior to induction Oxygen Delivery Method: Circle System Utilized Preoxygenation: Pre-oxygenation with 100% oxygen Induction Type: IV induction Ventilation: Oral airway inserted - appropriate to patient size Laryngoscope Size: Mac and 3 Grade View: Grade I Tube type: Oral Tube size: 6.5 mm Number of attempts: 1 Airway Equipment and Method: Stylet and Oral airway Placement Confirmation: ETT inserted through vocal cords under direct vision, positive ETCO2 and breath sounds checked- equal and bilateral Secured at: 21 cm Tube secured with: Tape Dental Injury: Teeth and Oropharynx as per pre-operative assessment  Comments: Atraumatic induction/intubation. Dentition and oral mucosa as per preop (at baseline patient is edentulous uppers and only a couple teeth on lowers)

## 2023-12-24 DIAGNOSIS — Z8659 Personal history of other mental and behavioral disorders: Secondary | ICD-10-CM | POA: Diagnosis not present

## 2023-12-24 DIAGNOSIS — S73005A Unspecified dislocation of left hip, initial encounter: Secondary | ICD-10-CM | POA: Diagnosis not present

## 2023-12-24 DIAGNOSIS — E119 Type 2 diabetes mellitus without complications: Secondary | ICD-10-CM | POA: Diagnosis not present

## 2023-12-24 DIAGNOSIS — I10 Essential (primary) hypertension: Secondary | ICD-10-CM | POA: Diagnosis not present

## 2023-12-24 LAB — CBC
HCT: 26.3 % — ABNORMAL LOW (ref 36.0–46.0)
Hemoglobin: 8.7 g/dL — ABNORMAL LOW (ref 12.0–15.0)
MCH: 31.2 pg (ref 26.0–34.0)
MCHC: 33.1 g/dL (ref 30.0–36.0)
MCV: 94.3 fL (ref 80.0–100.0)
Platelets: 221 K/uL (ref 150–400)
RBC: 2.79 MIL/uL — ABNORMAL LOW (ref 3.87–5.11)
RDW: 12.9 % (ref 11.5–15.5)
WBC: 15 K/uL — ABNORMAL HIGH (ref 4.0–10.5)
nRBC: 0 % (ref 0.0–0.2)

## 2023-12-24 LAB — IRON AND TIBC
Iron: 16 ug/dL — ABNORMAL LOW (ref 28–170)
Saturation Ratios: 7 % — ABNORMAL LOW (ref 10.4–31.8)
TIBC: 244 ug/dL — ABNORMAL LOW (ref 250–450)
UIBC: 228 ug/dL

## 2023-12-24 MED ORDER — OXYCODONE HCL 5 MG PO TABS
5.0000 mg | ORAL_TABLET | Freq: Four times a day (QID) | ORAL | Status: DC | PRN
  Administered 2023-12-26 – 2023-12-29 (×6): 5 mg via ORAL
  Filled 2023-12-24 (×6): qty 1

## 2023-12-24 MED ORDER — TRAMADOL HCL 50 MG PO TABS
50.0000 mg | ORAL_TABLET | Freq: Four times a day (QID) | ORAL | Status: DC | PRN
Start: 2023-12-24 — End: 2023-12-30
  Administered 2023-12-25 – 2023-12-29 (×4): 50 mg via ORAL
  Filled 2023-12-24 (×4): qty 1

## 2023-12-24 MED ORDER — ACETAMINOPHEN 500 MG PO TABS
1000.0000 mg | ORAL_TABLET | Freq: Three times a day (TID) | ORAL | Status: DC
  Administered 2023-12-24 – 2023-12-29 (×15): 1000 mg via ORAL
  Filled 2023-12-24 (×16): qty 2

## 2023-12-24 MED ORDER — IRON SUCROSE 20 MG/ML IV SOLN
200.0000 mg | Freq: Once | INTRAVENOUS | Status: AC
  Administered 2023-12-24: 200 mg via INTRAVENOUS
  Filled 2023-12-24: qty 10

## 2023-12-24 MED ORDER — SODIUM CHLORIDE 0.9 % IV BOLUS: 250.0000 mL | Freq: Once | INTRAVENOUS | Status: DC

## 2023-12-24 MED ORDER — ACETAMINOPHEN 650 MG RE SUPP: 650.0000 mg | Freq: Three times a day (TID) | RECTAL | Status: DC

## 2023-12-24 NOTE — Progress Notes (Signed)
 Physical Therapy Re-Evaluation and Treatment Patient Details Name: Madison Martinez MRN: [id number] DOB: 25-Apr-1927 Today's Date: 12/24/2023   History of Present Illness Madison Martinez is a 89 y.o. female admitted 12/18/23 for left hip dislocation. X-rays pelvis/hip demonstrate a concentrically reduced left hip arthroplasty and notably the acetabular component is lateralized and vertical, which may predispose her to continued instability and dislocation. Plan for conservative measures hopeful to avoid additional surgery. Pt had repeat dislocation during hospitalization resulting OR for L THA revision 10/4. PMHx: T2DM, HTN, HLD, L eye blindness, CVA with residual gait abnormality, osteoporosis, polyneuropathy, spinal stenosis, and dementia. Of note, recent hospitalization 9/22-9/25 for L hip dislocation after fall at home. Pt was reduced in ED and Ortho recommend non-operative mgmt, knee immobilizer, WBAT and outpatient follow up.    PT Comments  PT re-evaluation completed following L THA revision. Pt continues on posterior hip precautions and is now 20% PWB LLE. Pt seen in session with OT to maximize mobility. Pt limited by pain and fatigue. Pt very pleasant and cooperative and stating several times I just don't feel good. She required +2 total assist supine <> via helicopter method. Pt tolerated sitting EOB 5-7 minutes with fair sitting balance. ABD pillow left in place for transition to EOB, then removed for static sit and transition back to bed. Pillow replaced in supine. Post acute, recommend further therapy in inpatient setting < 3 hours/[place]. Goals updated.     If plan is discharge home, recommend the following: Two people to help with walking and/or transfers;Two people to help with bathing/dressing/bathroom;Assistance with cooking/housework;Assist for transportation;Help with stairs or ramp for entrance   Can travel by private vehicle     No  Equipment Recommendations  Hospital bed;Hoyer  lift;Wheelchair (measurements PT);Wheelchair cushion (measurements PT)    Recommendations for Other Services       Precautions / Restrictions Precautions Precautions: Posterior Hip;Fall Recall of Precautions/Restrictions: Impaired Required Braces or Orthoses: Other Brace Other Brace: LE abduction pillow in bed Restrictions Weight Bearing Restrictions Per Provider Order: Yes LLE Weight Bearing Per Provider Order: Partial weight bearing LLE Partial Weight Bearing Percentage or Pounds: 20%     Mobility  Bed Mobility Overal bed mobility: Needs Assistance Bed Mobility: Supine to Sit, Sit to Supine     Supine to sit: +2 for physical assistance, Total assist, HOB elevated Sit to supine: Total assist, +2 for physical assistance   General bed mobility comments: transition to/from EOB via helicopter method    Transfers                   General transfer comment: unable to progress beyond EOB due to pain and fatigue. Anticipate pt will have difficulty maintaining 20% PWB LLE due to dementia.    Ambulation/Gait                   Stairs             Wheelchair Mobility     Tilt Bed    Modified Rankin (Stroke Patients Only)       Balance Overall balance assessment: Needs assistance, History of Falls Sitting-balance support: Bilateral upper extremity supported, Feet supported Sitting balance-Leahy Scale: Fair Sitting balance - Comments: CGA EOB                                    Communication Communication Communication: Impaired Factors Affecting Communication: Hearing impaired  Cognition Arousal: Lethargic Behavior During Therapy: WFL for tasks assessed/performed   PT - Cognitive impairments: History of cognitive impairments                       PT - Cognition Comments: dementia at baseline Following commands: Impaired Following commands impaired: Follows one step commands with increased time    Cueing Cueing  Techniques: Verbal cues, Gestural cues  Exercises      General Comments General comments (skin integrity, edema, etc.): Max HR 102      Pertinent Vitals/Pain Pain Assessment Pain Assessment: Faces Faces Pain Scale: Hurts whole lot Pain Location: LLE with mobility Pain Descriptors / Indicators: Guarding, Grimacing, Moaning Pain Intervention(s): Monitored during session, Limited activity within patient's tolerance, Repositioned    Home Living Family/patient expects to be discharged to:: Skilled nursing facility Living Arrangements: Children Available Help at Discharge: Family;Available 24 hours/[place] Type of Home: House Home Access: Stairs to enter   [employer] of Steps: 5   Home Layout: Able to live on main level with bedroom/bathroom Home Equipment: Agricultural consultant (2 wheels);Educational psychologist (4 wheels)      Prior Function            PT Goals (current goals can now be found in the care plan section) Acute Rehab PT Goals Patient Stated Goal: not stated PT Goal Formulation: Patient unable to participate in goal setting Time For Goal Achievement: 01/07/24 Potential to Achieve Goals: Fair Progress towards PT goals: Goals updated    Frequency    Min 2X/week      PT Plan      Co-evaluation PT/OT/SLP Co-Evaluation/Treatment: Yes Reason for Co-Treatment: Complexity of the patient's impairments (multi-system involvement);For patient/therapist safety;To address functional/ADL transfers;Necessary to address cognition/behavior during functional activity PT goals addressed during session: Mobility/safety with mobility;Balance        AM-PAC PT 6 Clicks Mobility   Outcome Measure  Help needed turning from your back to your side while in a flat bed without using bedrails?: Total Help needed moving from lying on your back to sitting on the side of a flat bed without using bedrails?: Total Help needed moving to and from a bed to a chair (including a  wheelchair)?: Total Help needed standing up from a chair using your arms (e.g., wheelchair or bedside chair)?: Total Help needed to walk in hospital room?: Total Help needed climbing 3-5 steps with a railing? : Total 6 Click Score: 6    End of Session Equipment Utilized During Treatment: Gait belt Activity Tolerance: Patient limited by pain;Patient limited by fatigue Patient left: in bed;with call bell/phone within reach;with bed alarm set Nurse Communication: Mobility status PT Visit Diagnosis: Unsteadiness on feet (R26.81);History of falling (Z91.81);Muscle weakness (generalized) (M62.81);Difficulty in walking, not elsewhere classified (R26.2);Pain Pain - Right/Left: Left Pain - part of body: Hip     Time: 9241-9181 PT Time Calculation (min) (ACUTE ONLY): 20 min  Charges:      PT General Charges $$ ACUTE PT VISIT: 1 Visit                     Sari MATSU., PT  Office # [phone]    Erven Sari Shaker 12/24/2023, 9:35 AM

## 2023-12-24 NOTE — Progress Notes (Signed)
 Occupational Therapy Re-Evaluation and Treatment Patient Details Name: Madison Martinez MRN: [id number] DOB: [birth year] Today's Date: 12/24/2023   History of present illness Madison Martinez is a 89 y.o. female admitted 12/18/23 for left hip dislocation. X-rays pelvis/hip demonstrate a concentrically reduced left hip arthroplasty and notably the acetabular component is lateralized and vertical, which may predispose her to continued instability and dislocation. Plan for conservative measures hopeful to avoid additional surgery. Pt had repeat dislocation during hospitalization resulting OR for L THA revision 10/4. PMHx: T2DM, HTN, HLD, L eye blindness, CVA with residual gait abnormality, osteoporosis, polyneuropathy, spinal stenosis, and dementia. Of note, recent hospitalization 9/22-9/25 for L hip dislocation after fall at home. Pt was reduced in ED and Ortho recommend non-operative mgmt, knee immobilizer, WBAT and outpatient follow up.   OT comments  Pt now s/p surgery with continued posterior hip precautions, wound vac, and PWB 20% to L hip. Pt very hard of hearing and needing increased time to follow commands. Pt pleasant throughout but reports she just doesn't feel good multiple times during session. Pt requiring +2 total A for transition to sitting EOB and CGA-min A for sitting balance at EOB during grooming tasks. Sitting EOB ~6 minutes this session. Abduction pillow replaced once supine at end of session. Patient will benefit from continued inpatient follow up therapy, <3 hours/[place]       If plan is discharge home, recommend the following:  Two people to help with walking and/or transfers;Two people to help with bathing/dressing/bathroom;Assist for transportation;Help with stairs or ramp for entrance;Assistance with cooking/housework;Direct supervision/assist for medications management;Direct supervision/assist for financial management   Equipment Recommendations  Other (comment) (defer)     Recommendations for Other Services      Precautions / Restrictions Precautions Precautions: Posterior Hip;Fall Recall of Precautions/Restrictions: Impaired Required Braces or Orthoses: Other Brace Other Brace: LE abduction pillow in bed Restrictions Weight Bearing Restrictions Per Provider Order: Yes LLE Weight Bearing Per Provider Order: Partial weight bearing LLE Partial Weight Bearing Percentage or Pounds: 20%       Mobility Bed Mobility Overal bed mobility: Needs Assistance Bed Mobility: Supine to Sit, Sit to Supine     Supine to sit: +2 for physical assistance, Total assist, HOB elevated Sit to supine: Total assist, +2 for physical assistance   General bed mobility comments: transition to/from EOB via helicopter method    Transfers                   General transfer comment: unable to progress beyond EOB due to pain and fatigue. Anticipate pt will have difficulty maintaining 20% PWB LLE due to dementia.     Balance Overall balance assessment: Needs assistance, History of Falls Sitting-balance support: Bilateral upper extremity supported, Feet supported Sitting balance-Leahy Scale: Fair Sitting balance - Comments: CGA EOB                                   ADL either performed or assessed with clinical judgement   ADL Overall ADL's : Needs assistance/impaired     Grooming: Sitting;Contact guard assist;Oral care                     Toilet Transfer Details (indicate cue type and reason): deferred this session                Extremity/Trunk Assessment Upper Extremity Assessment Upper Extremity Assessment: RUE deficits/detail;LUE deficits/detail RUE Deficits /  Details: limited shoulder ROM, longstanding LUE Deficits / Details: limited shoulder ROM, longstanding   Lower Extremity Assessment Lower Extremity Assessment: Defer to PT evaluation   Cervical / Trunk Assessment Cervical / Trunk Assessment: Kyphotic    Vision  Ability to See in Adequate Light: 0 Adequate Patient Visual Report: No change from baseline     Perception     Praxis     Communication Communication Communication: Impaired Factors Affecting Communication: Hearing impaired   Cognition Arousal: Lethargic Behavior During Therapy: WFL for tasks assessed/performed Cognition: History of cognitive impairments             OT - Cognition Comments: pleasantly confused, follows commands with signiuficantly increased time                 Following commands: Impaired Following commands impaired: Follows one step commands with increased time      Cueing   Cueing Techniques: Verbal cues, Gestural cues  Exercises      Shoulder Instructions       General Comments HR MAX 102    Pertinent Vitals/ Pain       Pain Assessment Pain Assessment: Faces Faces Pain Scale: Hurts whole lot Pain Location: LLE with mobility Pain Descriptors / Indicators: Guarding, Grimacing, Moaning Pain Intervention(s): Limited activity within patient's tolerance  Home Living Family/patient expects to be discharged to:: Skilled nursing facility Living Arrangements: Children Available Help at Discharge: Family;Available 24 hours/[place] Type of Home: House Home Access: Stairs to enter [employer] of Steps: 5   Home Layout: Able to live on main level with bedroom/bathroom     Bathroom Shower/Tub: Arts development officer Toilet: Handicapped height     Home Equipment: Agricultural consultant (2 wheels);Educational psychologist (4 wheels)          Prior Functioning/Environment              Frequency  Min 2X/week        Progress Toward Goals  OT Goals(current goals can now be found in the care plan section)     Acute Rehab OT Goals Patient Stated Goal: get better OT Goal Formulation: With patient Time For Goal Achievement: 01/07/24 Potential to Achieve Goals: Fair  Plan      Co-evaluation    PT/OT/SLP  Co-Evaluation/Treatment: Yes Reason for Co-Treatment: Complexity of the patient's impairments (multi-system involvement);For patient/therapist safety;To address functional/ADL transfers;Necessary to address cognition/behavior during functional activity PT goals addressed during session: Mobility/safety with mobility;Balance OT goals addressed during session: ADL's and self-care      AM-PAC OT 6 Clicks Daily Activity     Outcome Measure   Help from another person eating meals?: A Little Help from another person taking care of personal grooming?: A Little Help from another person toileting, which includes using toliet, bedpan, or urinal?: Total Help from another person bathing (including washing, rinsing, drying)?: A Lot Help from another person to put on and taking off regular upper body clothing?: A Little Help from another person to put on and taking off regular lower body clothing?: Total 6 Click Score: 13    End of Session    OT Visit Diagnosis: Unsteadiness on feet (R26.81);Muscle weakness (generalized) (M62.81);Pain Pain - Right/Left: Left Pain - part of body: Hip   Activity Tolerance Patient tolerated treatment well   Patient Left in bed;with call bell/phone within reach;with bed alarm set   Nurse Communication Mobility status        Time: 9241-9181 OT Time Calculation (min): 20 min  Charges: OT General Charges $OT Visit: 1 Visit OT Evaluation $OT Re-eval: 1 Re-eval  Elma JONETTA Lebron FREDERICK, OTR/L [facility] Acute Rehabilitation Office: [phone]   Elma JONETTA Lebron 12/24/2023, 10:04 AM

## 2023-12-24 NOTE — Plan of Care (Addendum)
 Pt is alert and oriented x 2. This am Blood pressure soft 97/43 map 59. NP Andrez aware. Bolus 250 ordered and given. Follow up BP improved Andrez NP aware 115/47 map 67. Foley removed peri care performed  Problem: Education: Goal: Knowledge of General Education information will improve Description: Including pain rating scale, medication(s)/side effects and non-pharmacologic comfort measures Outcome: Progressing   Problem: Health Behavior/Discharge Planning: Goal: Ability to manage health-related needs will improve Outcome: Progressing   Problem: Clinical Measurements: Goal: Ability to maintain clinical measurements within normal limits will improve Outcome: Progressing Goal: Will remain free from infection Outcome: Progressing Goal: Diagnostic test results will improve Outcome: Progressing Goal: Respiratory complications will improve Outcome: Progressing Goal: Cardiovascular complication will be avoided Outcome: Progressing   Problem: Activity: Goal: Risk for activity intolerance will decrease Outcome: Progressing   Problem: Nutrition: Goal: Adequate nutrition will be maintained Outcome: Progressing   Problem: Coping: Goal: Level of anxiety will decrease Outcome: Progressing   Problem: Elimination: Goal: Will not experience complications related to bowel motility Outcome: Progressing Goal: Will not experience complications related to urinary retention Outcome: Progressing   Problem: Pain Managment: Goal: General experience of comfort will improve and/or be controlled Outcome: Progressing   Problem: Safety: Goal: Ability to remain free from injury will improve Outcome: Progressing   Problem: Skin Integrity: Goal: Risk for impaired skin integrity will decrease Outcome: Progressing

## 2023-12-24 NOTE — Progress Notes (Addendum)
 Progress Note   Patient: Madison Martinez Sult FMW:[id number] DOB: 02/10/1928 DOA: 12/18/2023     3 DOS: the patient was seen and examined on 12/24/2023   Brief hospital course:  Tashina M Peet is a 89 y.o. female  history of dementia, DM type II, hypertension, hyperlipidemia, CKD 3B, blindness of the left eye, CVA, and recent hip dislocation due to mechanical fall which was reduced in the ED and discharged from the hospital 9/25.  She presented to ED on 9/29 via EMS after a fall with recurrent left hip pain, x-ray at the rehab facility showed recurrent left hip dislocation.  Patient was seen by orthopedics in the ED and the hip was reduced.    Patient was admitted for further evaluation and management as outlined in detail below.   Assessment and Plan:  Left prosthetic hip dislocation - recurrent History of left hip arthroplasty History of recurrent left prosthetic hip dislocation Dislocated hip was reduced in ED by Dr. Ginger.   Repeat x-ray showing reduction of the left hip arthroplasty.  No acute evidence of fracture. --Ortho following, appreciate recommendations: --status post left hip arthoplasty revision on 10/4 with Dr. Edna --Follow post-op recommendations from Ortho --Hold Plavix  - can be resumed tomorrow POD-2 per Ortho --Pain control per orders - changed to scheduled Tylenol  and PRN tramadol  or oxy --PT/OT  --SNF placement  ABLA Iron deficiency --Post op Hbg 10 >> 8.7 --Iron studies reflect deficiency --IV iron infusion today --Trend Hbg, transfuse if < 7   History of CVA - Hold Plavix .  Last dose of Plavix  on 9/29 - resume POD-2   Non-insulin -dependent DM type II -Previously patient was diabetic.  A1c 6.2.  Continue heart healthy carb modified diet   Essential hypertension - BP's controlled, but labile soft at times after surgery 10/4 --Holding lisinopril  and Toprol -XL, resume when BP will tolerate   Hyperlipidemia -Continue Pravachol    History of  dementia Generalized anxiety disorder - Continue Seroquel  at bedtime   Acute cystitis - Continue amoxicillin  until 10/2   Hyperlipidemia History of CVA   CKD stage 3B Renal function at baseline. --Monitor     Subjective: Pt was sleeping this AM on rounds but woke up easily to voice.  She reports her hip is sore.  Denies other complaints & quickly returns to sleep.    Physical Exam: Vitals:   12/24/23 0631 12/24/23 0736 12/24/23 0738 12/24/23 1159  BP: (!) 123/45 (!) 112/47 (!) 121/45 (!) 119/58  Pulse:  94 95 86  Resp:    14  Temp:  97.7 F (36.5 C) 98.7 F (37.1 C) 98.1 F (36.7 C)  TempSrc:  Oral  Oral  SpO2:  97% 97%   Weight:      Height:       General exam: sleeping wakes to voice, no acute distress HEENT: pale lips, moist mucus membranes Respiratory system: CTAB, no wheezes, rales or rhonchi, normal respiratory effort. Cardiovascular system: normal S1/S2, RRR Gastrointestinal system: soft, NT, ND Central nervous system: no gross focal neurologic deficits, normal speech Extremities: left hip dressing clean dry intact, no surround erythem Skin: dry, intact, normal temperature   Data Reviewed: Notable labs - hbg 10 >> 8.7 today POD-1, WBC 13.4 >> 15.0   Family Communication: none present on rounds, I was unable to reach son, Max, by phone this afternoon. Will attempt again as time allows.  Disposition: Status is: Inpatient  Remains admitted because: Underwent surgery yesterday, d/c pending ortho's clearance. Needs SNF.  Planned Discharge Destination: Skilled nursing facility    Time spent: 35 minutes  Author: Burnard DELENA Cunning, DO 12/24/2023 12:31 PM  For on call review [email or web address].

## 2023-12-24 NOTE — Progress Notes (Signed)
 1 [facility] Post-Op Procedure(s) (LRB): REVISION, TOTAL ARTHROPLASTY, HIP, ACETABULAR COMPONENT (Left) Subjective:  Patient reports pain as mild to moderate.  Hx of dementia at baseline.  Physical therapist just finished at bedside, pt able to sit on bed today though limited further due to dementia and pain.  Slept okay.  Denies chest pain, ShOB, N/V.  Denies numbness or tingling.  No other complaints at this time.  Objective:   VITALS:   Vitals:   12/24/23 0403 12/24/23 0631 12/24/23 0736 12/24/23 0738  BP: (!) 97/43 (!) 123/45 (!) 112/47 (!) 121/45  Pulse: 94  94 95  Resp: 18     Temp: 98.6 F (37 C)  97.7 F (36.5 C) 98.7 F (37.1 C)  TempSrc: Oral  Oral   SpO2: 99%  97% 97%  Weight:      Height:        Resting comfortably in bed, abduction pillow in place, in NAD Neurologically intact Neurovascular intact Intact pulses distally Dorsiflexion/Plantar flexion intact though mildly diminished of surgery leg Incision: dressing C/D/I; wound vac in place with adequate seal and no fluid in cannister Compartment soft Wiggles toes appropriately   Lab Results  Component Value Date   WBC 15.0 (H) 12/24/2023   HGB 8.7 (L) 12/24/2023   HCT 26.3 (L) 12/24/2023   MCV 94.3 12/24/2023   PLT 221 12/24/2023   BMET    Component Value Date/Time   NA 135 12/23/2023 1326   NA 146 04/18/2016 0000   K 4.9 12/23/2023 1326   CL 103 12/23/2023 1326   CO2 22 12/23/2023 1326   GLUCOSE 188 (H) 12/23/2023 1326   BUN 28 (H) 12/23/2023 1326   BUN 20 04/18/2016 0000   CREATININE 1.20 (H) 12/23/2023 1326   CREATININE 0.91 (H) 02/26/2020 1431   CALCIUM 8.9 12/23/2023 1326   GFRNONAA 42 (L) 12/23/2023 1326   GFRNONAA 50 (L) 09/14/2016 0800    Yesterday's total administered Morphine  Milligram Equivalents: 47.5  Xray: stable post-operative imaging  Assessment/Plan: 1 [place] Post-Op   S/P Left hip arthroplasty revision acetabular component and head ball  due to instability and recurrent  dislocation  Principal Problem:   Hip dislocation, left (HCC) Active Problems:   Non-insulin  dependent type 2 diabetes mellitus (HCC)   Essential hypertension   Hyperlipidemia   CKD stage 3b, GFR 30-44 ml/min (HCC)   H/O total hip arthroplasty, left   History of dementia   History of CVA (cerebrovascular accident)   Closed dislocation of left hip (HCC)   Acute cystitis   Hgb 8.7, continue to monitor for now.  If becomes symptomatic or Hgb < 7.0, may consider transfusion and/or holding DVT prophylaxis as needed.   Post op recs: WB: 20% PWB LLE, posterior hip precautions with hip abduction pillow when in bed Abx: ancef while in house, follow-up IntraOp cultures, discharged on extend antibiotic prophylaxis Imaging: PACU pelvis Xray Dressing: Prevena incisional wound VAC to stay on for 1 week DVT prophylaxis: Aspirin  81BID starting POD1 and resume Plavix  postop Amini 2 Follow up: 2 weeks after surgery for a wound check with Dr. Edna at [facility].  Address: [address], [place], [place] [address]  Office Phone: [phone]   Madison Martinez 12/24/2023, 8:41 AM   Contact information:   Weekdays 7am-5pm epic message Dr. Edna, or call office for patient follow up: [phone] After hours and holidays please check [email or web address] for group call information for [facility]

## 2023-12-25 ENCOUNTER — Encounter (HOSPITAL_COMMUNITY): Payer: Self-pay | Admitting: Orthopedic Surgery

## 2023-12-25 DIAGNOSIS — S73005A Unspecified dislocation of left hip, initial encounter: Secondary | ICD-10-CM | POA: Diagnosis not present

## 2023-12-25 LAB — CBC
HCT: 25.6 % — ABNORMAL LOW (ref 36.0–46.0)
Hemoglobin: 8.2 g/dL — ABNORMAL LOW (ref 12.0–15.0)
MCH: 30.9 pg (ref 26.0–34.0)
MCHC: 32 g/dL (ref 30.0–36.0)
MCV: 96.6 fL (ref 80.0–100.0)
Platelets: 209 K/uL (ref 150–400)
RBC: 2.65 MIL/uL — ABNORMAL LOW (ref 3.87–5.11)
RDW: 13.1 % (ref 11.5–15.5)
WBC: 13.4 K/uL — ABNORMAL HIGH (ref 4.0–10.5)
nRBC: 0 % (ref 0.0–0.2)

## 2023-12-25 LAB — BASIC METABOLIC PANEL WITH GFR
Anion gap: 8 (ref 5–15)
BUN: 29 mg/dL — ABNORMAL HIGH (ref 8–23)
CO2: 21 mmol/L — ABNORMAL LOW (ref 22–32)
Calcium: 8.3 mg/dL — ABNORMAL LOW (ref 8.9–10.3)
Chloride: 103 mmol/L (ref 98–111)
Creatinine, Ser: 1.22 mg/dL — ABNORMAL HIGH (ref 0.44–1.00)
GFR, Estimated: 41 mL/min — ABNORMAL LOW (ref >60)
Glucose, Bld: 168 mg/dL — ABNORMAL HIGH (ref 70–99)
Potassium: 4.3 mmol/L (ref 3.5–5.1)
Sodium: 132 mmol/L — ABNORMAL LOW (ref 135–145)

## 2023-12-25 NOTE — Progress Notes (Signed)
     2 Days Post-Op Procedure(s) (LRB): REVISION, TOTAL ARTHROPLASTY, HIP, ACETABULAR COMPONENT (Left)   Subjective: Patient reports pain as mild to moderate.  Hx of dementia at baseline.  Limited participation with physical therapy.  Objective:   VITALS:   Vitals:   12/24/23 1159 12/24/23 1500 12/24/23 1943 12/25/23 0433  BP: (!) 119/58 (!) 115/57 (!) 127/50 (!) 122/43  Pulse: 86 83 86 87  Resp: 14 16  15   Temp: 98.1 F (36.7 C) 98.1 F (36.7 C) 97.8 F (36.6 C) 98.6 F (37 C)  TempSrc: Oral Oral Oral Oral  SpO2:  97% 96% 98%  Weight:      Height:        Resting comfortably in bed, abduction pillow in place, in NAD Neurologically intact Neurovascular intact Intact pulses distally Dorsiflexion/Plantar flexion intact though mildly diminished of surgery leg Incision: dressing C/D/I; wound vac holding suction, cannister was loose and when adjusted machine no longer beeping. Compartment soft Wiggles toes appropriately   Lab Results  Component Value Date   WBC 13.4 (H) 12/25/2023   HGB 8.2 (L) 12/25/2023   HCT 25.6 (L) 12/25/2023   MCV 96.6 12/25/2023   PLT 209 12/25/2023   BMET    Component Value Date/Time   NA 132 (L) 12/25/2023 0015   NA 146 04/18/2016 0000   K 4.3 12/25/2023 0015   CL 103 12/25/2023 0015   CO2 21 (L) 12/25/2023 0015   GLUCOSE 168 (H) 12/25/2023 0015   BUN 29 (H) 12/25/2023 0015   BUN 20 04/18/2016 0000   CREATININE 1.22 (H) 12/25/2023 0015   CREATININE 0.91 (H) 02/26/2020 1431   CALCIUM 8.3 (L) 12/25/2023 0015   GFRNONAA 41 (L) 12/25/2023 0015   GFRNONAA 50 (L) 09/14/2016 0800    Yesterday's total administered Morphine  Milligram Equivalents: 5  Xray: stable post-operative imaging  Assessment/Plan: 2 Days Post-Op   S/P Left hip arthroplasty revision acetabular component and head ball  due to instability and recurrent dislocation  Principal Problem:   Hip dislocation, left (HCC) Active Problems:   Non-insulin  dependent type 2  diabetes mellitus (HCC)   Essential hypertension   Hyperlipidemia   CKD stage 3b, GFR 30-44 ml/min (HCC)   H/O total hip arthroplasty, left   History of dementia   History of CVA (cerebrovascular accident)   Closed dislocation of left hip (HCC)   Acute cystitis   Hgb 8.7, continue to monitor for now.  If becomes symptomatic or Hgb < 7.0, may consider transfusion and/or holding DVT prophylaxis as needed.   Post op recs: WB: 20% PWB LLE, posterior hip precautions with hip abduction pillow when in bed Abx: ancef while in house, follow-up IntraOp cultures, discharged on extend antibiotic prophylaxis - Cxs NGTD Imaging: PACU pelvis Xray Dressing: Prevena incisional wound VAC to stay on for 1 week DVT prophylaxis: Aspirin  81BID starting POD1 and resume Plavix  postop [name] Follow up: 2 weeks after surgery for Madison wound check with Madison Martinez at [facility].  Address: [address], [place], [place] [address]  Office Phone: [phone]   Madison Martinez Madison Martinez 12/25/2023, 7:01 AM   Contact information:   Weekdays 7am-5pm epic message Madison Martinez, or call office for patient follow up: [phone] After hours and holidays please check [email or web address] for group call information for [facility]

## 2023-12-25 NOTE — Plan of Care (Signed)

## 2023-12-25 NOTE — Progress Notes (Addendum)
 PROGRESS NOTE    Madison Martinez  FMW:[id number] DOB: 11-15-1927 DOA: 12/18/2023 PCP: Catherine Charlies LABOR, DO   Brief Narrative:    89 y.o. female  history of dementia, DM type II, hypertension, hyperlipidemia, CKD 3B, blindness of the left eye, CVA, and recent hip dislocation due to mechanical fall which was reduced in the ED and discharged from the hospital 9/25.  She presented to ED on 9/29 via EMS after a fall with recurrent left hip pain, x-ray at the rehab facility showed recurrent left hip dislocation.  Patient was seen by orthopedics in the ED and the hip was reduced.   -status post left hip arthoplasty revision on 10/4 with Dr. Edna  -Adjusting pain meds, plan is dc to SNF in the next 24-48 hours.  Assessment & Plan:  Principal Problem:   Hip dislocation, left (HCC) Active Problems:   Non-insulin  dependent type 2 diabetes mellitus (HCC)   Essential hypertension   Hyperlipidemia   CKD stage 3b, GFR 30-44 ml/min (HCC)   H/O total hip arthroplasty, left   History of dementia   History of CVA (cerebrovascular accident)   Closed dislocation of left hip (HCC)   Acute cystitis   Left prosthetic hip dislocation - recurrent History of left hip arthroplasty History of recurrent left prosthetic hip dislocation Dislocated hip was reduced in ED by Dr. Ginger.   Repeat x-ray showing reduction of the left hip arthroplasty.  No acute evidence of fracture. --Ortho following, appreciate recommendations: --status post left hip arthoplasty revision on 10/4 with Dr. Edna --Follow post-op recommendations from Ortho --Resumed plavix  on 10/6 --Pain control per orders - changed to scheduled Tylenol  and PRN tramadol  or oxy --PT/OT  --SNF placement   ABLA Iron deficiency --Post op Hbg 10 >> 8.7 >> 8.2 --Iron studies reflect deficiency --IV iron infusion received on 10/5 --Trend Hbg, transfuse if < 7 or actively bleeding.   History of CVA - Resumed plavix  on 10/6    Non-insulin -dependent DM type II -Previously patient was diabetic.  A1c 6.2.  Continue heart healthy carb modified diet   Essential hypertension - BP's controlled, but labile soft at times after surgery 10/4 --Holding lisinopril  and Toprol -XL, resume when BP will tolerate   Hyperlipidemia -Continue Pravachol    History of dementia Generalized anxiety disorder - Continue Seroquel  at bedtime   Acute cystitis - s/p amoxicillin    Hyperlipidemia History of CVA   CKD stage 3B Renal function at baseline. --Monitor  Disposition: SNF ([facility] [place])  DVT prophylaxis: SCDs Start: 12/19/23 0150 Place TED hose Start: 12/19/23 0150     Code Status: Full Code Family Communication:  None at the bedside Status is: Inpatient Remains inpatient appropriate because: Pain management    Subjective:  She is extremely hard of hearing. She is complaining of severe left hip pain.    Examination:  General exam: Appears calm and comfortable, hard of hearing  Respiratory system: Clear to auscultation. Respiratory effort normal. Cardiovascular system: S1 & S2 heard, RRR. No JVD, murmurs, rubs, gallops or clicks. No pedal edema. Gastrointestinal system: Abdomen is nondistended, soft and nontender. No organomegaly or masses felt. Normal bowel sounds heard. Central nervous system: Alert and oriented. No focal neurological deficits. Extremities: Left hip dressing and wound vac in place     Diet Orders (From admission, onward)     Start     Ordered   12/23/23 1146  Diet regular Room service appropriate? Yes; Fluid consistency: Thin  Diet effective now  Question Answer Comment  Room service appropriate? Yes   Fluid consistency: Thin      12/23/23 1145            Objective: Vitals:   12/24/23 1500 12/24/23 1943 12/25/23 0433 12/25/23 0727  BP: (!) 115/57 (!) 127/50 (!) 122/43 133/63  Pulse: 83 86 87 83  Resp: 16  15 16   Temp: 98.1 F (36.7 C) 97.8 F (36.6 C)  98.6 F (37 C) 98 F (36.7 C)  TempSrc: Oral Oral Oral   SpO2: 97% 96% 98% 99%  Weight:      Height:        Intake/Output Summary (Last 24 hours) at 12/25/2023 1028 Last data filed at 12/24/2023 2224 Gross per 24 hour  Intake 953.58 ml  Output 0 ml  Net 953.58 ml   Filed Weights   12/19/23 1243 12/23/23 0746  Weight: 59.2 kg 59.2 kg    Scheduled Meds:  acetaminophen   1,000 mg Oral Q8H   Or   acetaminophen   650 mg Rectal Q8H   aspirin  EC  81 mg Oral BID   clopidogrel   75 mg Oral Daily   pravastatin   40 mg Oral q1800   QUEtiapine   50 mg Oral QHS   sodium chloride  flush  3 mL Intravenous Q12H   sodium chloride  flush  3 mL Intravenous Q12H   Continuous Infusions:   ceFAZolin (ANCEF) IV 2 g (12/25/23 1022)   sodium chloride  Stopped (12/24/23 9147)    Nutritional status     Body mass index is 22.39 kg/m.  Data Reviewed:   CBC: Recent Labs  Lab 12/19/23 0535 12/21/23 0413 12/23/23 1326 12/24/23 0428 12/25/23 0015  WBC 11.1* 10.5 13.4* 15.0* 13.4*  HGB 10.0* 10.5* 10.0* 8.7* 8.2*  HCT 31.6* 32.5* 31.3* 26.3* 25.6*  MCV 95.8 94.8 96.6 94.3 96.6  PLT 212 212 224 221 209   Basic Metabolic Panel: Recent Labs  Lab 12/18/23 2154 12/19/23 0535 12/21/23 0413 12/23/23 1326 12/25/23 0015  NA 138 136 135 135 132*  K 4.1 3.9 3.8 4.9 4.3  CL 111 111 105 103 103  CO2 20* 18* 21* 22 21*  GLUCOSE 135* 116* 113* 188* 168*  BUN 25* 23 23 28* 29*  CREATININE 1.14* 1.04* 0.96 1.20* 1.22*  CALCIUM 8.2* 8.3* 8.7* 8.9 8.3*   GFR: Estimated Creatinine Clearance: 24.3 mL/min (A) (by C-G formula based on SCr of 1.22 mg/dL (H)). Liver Function Tests: Recent Labs  Lab 12/19/23 0535  AST 23  ALT 15  ALKPHOS 84  BILITOT 0.5  PROT 5.5*  ALBUMIN 2.4*   No results for input(s): LIPASE, AMYLASE in the last 168 hours. No results for input(s): AMMONIA in the last 168 hours. Coagulation Profile: No results for input(s): INR, PROTIME in the last 168  hours. Cardiac Enzymes: No results for input(s): CKTOTAL, CKMB, CKMBINDEX, TROPONINI in the last 168 hours. BNP (last 3 results) No results for input(s): PROBNP in the last 8760 hours. HbA1C: No results for input(s): HGBA1C in the last 72 hours. CBG: Recent Labs  Lab 12/23/23 0729 12/23/23 1130  GLUCAP 117* 169*   Lipid Profile: No results for input(s): CHOL, HDL, LDLCALC, TRIG, CHOLHDL, LDLDIRECT in the last 72 hours. Thyroid  Function Tests: No results for input(s): TSH, T4TOTAL, FREET4, T3FREE, THYROIDAB in the last 72 hours. Anemia Panel: Recent Labs    12/23/23 1321  TIBC 244*  IRON 16*   Sepsis Labs: No results for input(s): PROCALCITON, LATICACIDVEN in the last 168 hours.  Recent Results (from the past 240 hours)  MRSA Next Gen by PCR, Nasal     Status: None   Collection Time: 12/19/23 12:43 PM   Specimen: Nasal Mucosa; Nasal Swab  Result Value Ref Range Status   MRSA by PCR Next Gen NOT DETECTED NOT DETECTED Final    Comment: (NOTE) The GeneXpert MRSA Assay (FDA approved for NASAL specimens only), is one component of a comprehensive MRSA colonization surveillance program. It is not intended to diagnose MRSA infection nor to guide or monitor treatment for MRSA infections. Test performance is not FDA approved in patients less than 54 years old. Performed at [facility], [address] [place], [place] [address]   Aerobic/Anaerobic Culture w Gram Stain (surgical/deep wound)     Status: None (Preliminary result)   Collection Time: 12/23/23  8:52 AM   Specimen: Soft Tissue, Other  Result Value Ref Range Status   Specimen Description TISSUE  Final   Special Requests LEFT HIP  Final   Gram Stain   Final    RARE WBC SEEN RARE SQUAMOUS EPITHELIAL CELLS PRESENT NO ORGANISMS SEEN    Culture   Final    NO GROWTH 2 DAYS Performed at [facility], [address], [place], [place] [address]    Report Status PENDING   Incomplete  Aerobic/Anaerobic Culture w Gram Stain (surgical/deep wound)     Status: None (Preliminary result)   Collection Time: 12/23/23  8:53 AM   Specimen: Soft Tissue, Other  Result Value Ref Range Status   Specimen Description TISSUE  Final   Special Requests LEFT HIP  Final   Gram Stain RARE WBC SEEN NO ORGANISMS SEEN   Final   Culture   Final    NO GROWTH 2 DAYS Performed at [facility], [address] [place], [place] [address]    Report Status PENDING  Incomplete  Aerobic/Anaerobic Culture w Gram Stain (surgical/deep wound)     Status: None (Preliminary result)   Collection Time: 12/23/23  8:53 AM   Specimen: Soft Tissue, Other  Result Value Ref Range Status   Specimen Description TISSUE  Final   Special Requests LEFT HIP  Final   Gram Stain   Final    FEW WBC PRESENT,BOTH PMN AND MONONUCLEAR NO ORGANISMS SEEN    Culture   Final    NO GROWTH 2 DAYS Performed at [facility], [address], [place], [place] [address]    Report Status PENDING  Incomplete         Radiology Studies: DG HIP UNILAT W OR W/O PELVIS 2-3 VIEWS LEFT Result Date: 12/23/2023 CLINICAL DATA:  Postop. EXAM: DG HIP (WITH OR WITHOUT PELVIS) 2-3V LEFT COMPARISON:  Preoperative imaging FINDINGS: Previous left hip arthroplasty dislocation has been reduced. The femoral head component is now seated in the acetabular component. No evidence of acute fracture. Previous right hip arthroplasty. Question wound VAC projecting over the left hip. IMPRESSION: Previous left hip arthroplasty dislocation has been reduced. Electronically Signed   By: Andrea Gasman M.D.   On: 12/23/2023 14:33        LOS: 3 days   Time spent= 36 mins    Deliliah Room, MD [facility]  If 7PM-7AM, please contact night-coverage  12/25/2023, 10:28 AM

## 2023-12-25 NOTE — Care Management Important Message (Signed)
 Important Message  Patient Details  Name: Noma Quijas Coole MRN: [id number] Date of Birth: 1927/08/15   Important Message Given:  Yes - Medicare IM     Jon Cruel 12/25/2023, 12:31 PM

## 2023-12-25 NOTE — TOC Progression Note (Signed)
 Transition of Care [facility]) - Progression Note    Patient Details  Name: Madison Martinez MRN: [id number] Date of Birth: March 11, 1928  [facility]) CM/SW Contact  Bridget Cordella Simmonds, LCSW Phone Number: 12/25/2023, 2:46 PM  Clinical Narrative:   CSW received message from [facility] that they will not have available bed until Friday.     Expected Discharge Plan: Skilled Nursing Facility Barriers to Discharge: Continued Medical Work up               Expected Discharge Plan and Services In-house Referral: Clinical Social Work   Post Acute Care Choice: Skilled Nursing Facility Living arrangements for the past 2 months: Single Family Home                                       Social Drivers of Health (SDOH) Interventions SDOH Screenings   Food Insecurity: Patient Unable To Answer (12/19/2023)  Housing: Unknown (12/19/2023)  Transportation Needs: No Transportation Needs (12/19/2023)  Utilities: Not At Risk (12/19/2023)  Alcohol Screen: Low Risk  (08/12/2020)  Depression (PHQ2-9): Medium Risk (08/11/2023)  Financial Resource Strain: Low Risk  (08/31/2022)  Physical Activity: Insufficiently Active (08/31/2022)  Social Connections: Socially Isolated (12/19/2023)  Stress: No Stress Concern Present (08/31/2022)  Tobacco Use: Low Risk  (12/23/2023)    Readmission Risk Interventions     No data to display

## 2023-12-26 DIAGNOSIS — S73005A Unspecified dislocation of left hip, initial encounter: Secondary | ICD-10-CM | POA: Diagnosis not present

## 2023-12-26 NOTE — Progress Notes (Signed)
 PROGRESS NOTE    Madison Martinez  FMW:[id number] DOB: 1927/06/28 DOA: 12/18/2023 PCP: Catherine Charlies LABOR, DO   Brief Narrative:    89 y.o. female  history of dementia, DM type II, hypertension, hyperlipidemia, CKD 3B, blindness of the left eye, CVA, and recent hip dislocation due to mechanical fall which was reduced in the ED and discharged from the hospital 9/25.  She presented to ED on 9/29 via EMS after a fall with recurrent left hip pain, x-ray at the rehab facility showed recurrent left hip dislocation.  Patient was seen by orthopedics in the ED and the hip was reduced.   -status post left hip arthoplasty revision on 10/4 with Dr. Edna  -DC back to SNF on 10/10  Assessment & Plan:  Principal Problem:   Hip dislocation, left (HCC) Active Problems:   Non-insulin  dependent type 2 diabetes mellitus (HCC)   Essential hypertension   Hyperlipidemia   CKD stage 3b, GFR 30-44 ml/min (HCC)   H/O total hip arthroplasty, left   History of dementia   History of CVA (cerebrovascular accident)   Closed dislocation of left hip (HCC)   Acute cystitis   Left prosthetic hip dislocation - recurrent History of left hip arthroplasty History of recurrent left prosthetic hip dislocation Dislocated hip was reduced in ED by Dr. Ginger.   Repeat x-ray showing reduction of the left hip arthroplasty.  No acute evidence of fracture. --Ortho following, appreciate recommendations: --status post left hip arthoplasty revision on 10/4 with Dr. Edna --Follow post-op recommendations from Ortho --Resumed plavix  on 10/6 --Pain control per orders - changed to scheduled Tylenol  and PRN tramadol  or oxy --PT/OT  --DC back to SNF on 10/10   ABLA Iron deficiency --Post op Hbg 10 >> 8.7 >> 8.2 --Iron studies reflect deficiency --IV iron infusion received on 10/5 --Trend Hbg, transfuse if < 7 or actively bleeding.   History of CVA - Resumed plavix  on 10/6   Non-insulin -dependent DM type II -Previously  patient was diabetic.  A1c 6.2.  Continue heart healthy carb modified diet   Essential hypertension - BP's controlled, but labile soft at times after surgery 10/4 --Holding lisinopril  and Toprol -XL, resume when BP will tolerate   Hyperlipidemia -Continue Pravachol    History of dementia Generalized anxiety disorder - Continue Seroquel  at bedtime   Acute cystitis - s/p amoxicillin    Hyperlipidemia History of CVA   CKD stage 3B Renal function at baseline. --Monitor  Disposition: SNF ([facility] [place]) on 10/10 as per TOC.  DVT prophylaxis: SCDs Start: 12/19/23 0150 Place TED hose Start: 12/19/23 0150     Code Status: Full Code Family Communication:  None at the bedside Status is: Inpatient Remains inpatient appropriate because: Pain management    Subjective:  She is extremely hard of hearing. Appears slightly confused and drowsy.   Examination:  General exam: Appears slightly drowsy, hard of hearing  Respiratory system: Clear to auscultation. Respiratory effort normal. Cardiovascular system: S1 & S2 heard, RRR. No JVD, murmurs, rubs, gallops or clicks. No pedal edema. Gastrointestinal system: Abdomen is nondistended, soft and nontender. No organomegaly or masses felt. Normal bowel sounds heard. Central nervous system: Slightly drowsy and confused. No focal neurological deficits. Extremities: Left hip dressing and wound vac in place     Diet Orders (From admission, onward)     Start     Ordered   12/23/23 1146  Diet regular Room service appropriate? Yes; Fluid consistency: Thin  Diet effective now  Question Answer Comment  Room service appropriate? Yes   Fluid consistency: Thin      12/23/23 1145            Objective: Vitals:   12/25/23 0727 12/25/23 1954 12/26/23 0231 12/26/23 0802  BP: 133/63 (!) 114/53 (!) 124/51 (!) 125/95  Pulse: 83 92 83 85  Resp: 16 18 18 16   Temp: 98 F (36.7 C) 98.4 F (36.9 C) 99.4 F (37.4 C) 98.7 F  (37.1 C)  TempSrc:  Oral Oral   SpO2: 99% 95% 95% 99%  Weight:      Height:        Intake/Output Summary (Last 24 hours) at 12/26/2023 1015 Last data filed at 12/26/2023 9166 Gross per 24 hour  Intake 120 ml  Output 0 ml  Net 120 ml   Filed Weights   12/19/23 1243 12/23/23 0746  Weight: 59.2 kg 59.2 kg    Scheduled Meds:  acetaminophen   1,000 mg Oral Q8H   Or   acetaminophen   650 mg Rectal Q8H   aspirin  EC  81 mg Oral BID   clopidogrel   75 mg Oral Daily   pravastatin   40 mg Oral q1800   QUEtiapine   50 mg Oral QHS   sodium chloride  flush  3 mL Intravenous Q12H   sodium chloride  flush  3 mL Intravenous Q12H   Continuous Infusions:   ceFAZolin (ANCEF) IV 2 g (12/25/23 2227)   sodium chloride  Stopped (12/24/23 9147)    Nutritional status     Body mass index is 22.39 kg/m.  Data Reviewed:   CBC: Recent Labs  Lab 12/21/23 0413 12/23/23 1326 12/24/23 0428 12/25/23 0015  WBC 10.5 13.4* 15.0* 13.4*  HGB 10.5* 10.0* 8.7* 8.2*  HCT 32.5* 31.3* 26.3* 25.6*  MCV 94.8 96.6 94.3 96.6  PLT 212 224 221 209   Basic Metabolic Panel: Recent Labs  Lab 12/21/23 0413 12/23/23 1326 12/25/23 0015  NA 135 135 132*  K 3.8 4.9 4.3  CL 105 103 103  CO2 21* 22 21*  GLUCOSE 113* 188* 168*  BUN 23 28* 29*  CREATININE 0.96 1.20* 1.22*  CALCIUM 8.7* 8.9 8.3*   GFR: Estimated Creatinine Clearance: 24.3 mL/min (A) (by C-G formula based on SCr of 1.22 mg/dL (H)). Liver Function Tests: No results for input(s): AST, ALT, ALKPHOS, BILITOT, PROT, ALBUMIN in the last 168 hours.  No results for input(s): LIPASE, AMYLASE in the last 168 hours. No results for input(s): AMMONIA in the last 168 hours. Coagulation Profile: No results for input(s): INR, PROTIME in the last 168 hours. Cardiac Enzymes: No results for input(s): CKTOTAL, CKMB, CKMBINDEX, TROPONINI in the last 168 hours. BNP (last 3 results) No results for input(s): PROBNP in the last 8760  hours. HbA1C: No results for input(s): HGBA1C in the last 72 hours. CBG: Recent Labs  Lab 12/23/23 0729 12/23/23 1130  GLUCAP 117* 169*   Lipid Profile: No results for input(s): CHOL, HDL, LDLCALC, TRIG, CHOLHDL, LDLDIRECT in the last 72 hours. Thyroid  Function Tests: No results for input(s): TSH, T4TOTAL, FREET4, T3FREE, THYROIDAB in the last 72 hours. Anemia Panel: Recent Labs    12/23/23 1321  TIBC 244*  IRON 16*   Sepsis Labs: No results for input(s): PROCALCITON, LATICACIDVEN in the last 168 hours.  Recent Results (from the past 240 hours)  MRSA Next Gen by PCR, Nasal     Status: None   Collection Time: 12/19/23 12:43 PM   Specimen: Nasal Mucosa; Nasal Swab  Result Value  Ref Range Status   MRSA by PCR Next Gen NOT DETECTED NOT DETECTED Final    Comment: (NOTE) The GeneXpert MRSA Assay (FDA approved for NASAL specimens only), is one component of a comprehensive MRSA colonization surveillance program. It is not intended to diagnose MRSA infection nor to guide or monitor treatment for MRSA infections. Test performance is not FDA approved in patients less than 28 years old. Performed at [facility], [address] [place], [place] [address]   Aerobic/Anaerobic Culture w Gram Stain (surgical/deep wound)     Status: None (Preliminary result)   Collection Time: 12/23/23  8:52 AM   Specimen: Soft Tissue, Other  Result Value Ref Range Status   Specimen Description TISSUE  Final   Special Requests LEFT HIP  Final   Gram Stain   Final    RARE WBC SEEN RARE SQUAMOUS EPITHELIAL CELLS PRESENT NO ORGANISMS SEEN Performed at [facility], [address], [place], [place] [address]    Culture   Final    CULTURE REINCUBATED FOR BETTER GROWTH NO ANAEROBES ISOLATED; CULTURE IN PROGRESS FOR 5 DAYS    Report Status PENDING  Incomplete  Aerobic/Anaerobic Culture w Gram Stain (surgical/deep wound)     Status: None (Preliminary result)    Collection Time: 12/23/23  8:53 AM   Specimen: Soft Tissue, Other  Result Value Ref Range Status   Specimen Description TISSUE  Final   Special Requests LEFT HIP  Final   Gram Stain RARE WBC SEEN NO ORGANISMS SEEN   Final   Culture   Final    NO GROWTH 3 DAYS NO ANAEROBES ISOLATED; CULTURE IN PROGRESS FOR 5 DAYS Performed at [facility], [address], [place], [place] [address]    Report Status PENDING  Incomplete  Aerobic/Anaerobic Culture w Gram Stain (surgical/deep wound)     Status: None (Preliminary result)   Collection Time: 12/23/23  8:53 AM   Specimen: Soft Tissue, Other  Result Value Ref Range Status   Specimen Description TISSUE  Final   Special Requests LEFT HIP  Final   Gram Stain   Final    FEW WBC PRESENT,BOTH PMN AND MONONUCLEAR NO ORGANISMS SEEN    Culture   Final    NO GROWTH 3 DAYS NO ANAEROBES ISOLATED; CULTURE IN PROGRESS FOR 5 DAYS Performed at [facility], [address] [place], [place] [address]    Report Status PENDING  Incomplete         Radiology Studies: No results found.       LOS: 4 days   Time spent= 36 mins    Deliliah Room, MD [facility]  If 7PM-7AM, please contact night-coverage  12/26/2023, 10:15 AM

## 2023-12-26 NOTE — Plan of Care (Signed)

## 2023-12-26 NOTE — Progress Notes (Signed)
 Physical Therapy Treatment Patient Details Name: Madison Martinez MRN: [id number] DOB: [birth year] Today's Date: 12/26/2023   History of Present Illness Madison Martinez is a 89 y.o. female admitted 12/18/23 for left hip dislocation. X-rays pelvis/hip demonstrate a concentrically reduced left hip arthroplasty and notably the acetabular component is lateralized and vertical, which may predispose her to continued instability and dislocation. Plan for conservative measures hopeful to avoid additional surgery. Pt had repeat dislocation during hospitalization resulting OR for L THA revision 10/4. PMHx: T2DM, HTN, HLD, L eye blindness, CVA with residual gait abnormality, osteoporosis, polyneuropathy, spinal stenosis, and dementia. Of note, recent hospitalization 9/22-9/25 for L hip dislocation after fall at home. Pt was reduced in ED and Ortho recommend non-operative mgmt, knee immobilizer, WBAT and outpatient follow up.    PT Comments  Pt seen for PT tx with pt received in bed, pleasantly confused throughout session, also limited by [facility]. Pt performs BLE strengthening exercises with PROM/AAROM, LLE exercises limited by pt stating that's enough & behaviors indicative of pain.  Will continue to follow pt acutely to progress mobility as able.   If plan is discharge home, recommend the following: Two people to help with walking and/or transfers;Two people to help with bathing/dressing/bathroom;Assistance with cooking/housework;Assist for transportation;Help with stairs or ramp for entrance   Can travel by private vehicle     No  Equipment Recommendations  Hospital bed;Hoyer lift;Wheelchair (measurements PT);Wheelchair cushion (measurements PT)    Recommendations for Other Services       Precautions / Restrictions Precautions Precautions: Fall;Posterior Hip Other Brace: LE abduction pillow in bed Restrictions Weight Bearing Restrictions Per Provider Order: Yes LLE Weight Bearing Per Provider Order: Partial  weight bearing LLE Partial Weight Bearing Percentage or Pounds: 20%     Mobility  Bed Mobility               General bed mobility comments: unsafe to attempt without +2 assist on this date    Transfers                        Ambulation/Gait                   Stairs             Wheelchair Mobility     Tilt Bed    Modified Rankin (Stroke Patients Only)       Balance                                            Communication Communication Communication: Impaired Factors Affecting Communication: Hearing impaired  Cognition Arousal: Alert Behavior During Therapy: WFL for tasks assessed/performed   PT - Cognitive impairments: History of cognitive impairments                       PT - Cognition Comments: dementia at baseline, reports she's 89 y/o Following commands: Impaired Following commands impaired: Follows one step commands inconsistently    Cueing Cueing Techniques: Verbal cues, Gestural cues, Tactile cues  Exercises General Exercises - Lower Extremity Short Arc Quad: AAROM, PROM, Supine, Both (15 on each LE) Hip ABduction/ADduction: AAROM, PROM, Left, Right, Supine (LLE PROM/AAROM x 6 then pt reports that's enough, 15x on RLE AAROM)    General Comments        Pertinent Vitals/Pain Pain Assessment Pain  Assessment: Faces Faces Pain Scale: Hurts even more Pain Location: LLE with movement Pain Descriptors / Indicators: Grimacing, Guarding, Discomfort Pain Intervention(s): Limited activity within patient's tolerance, Repositioned    Home Living                          Prior Function            PT Goals (current goals can now be found in the care plan section) Acute Rehab PT Goals Patient Stated Goal: not stated PT Goal Formulation: Patient unable to participate in goal setting Time For Goal Achievement: 01/07/24 Potential to Achieve Goals: Poor Progress towards PT goals:  Progressing toward goals    Frequency    Min 2X/week      PT Plan      Co-evaluation              AM-PAC PT 6 Clicks Mobility   Outcome Measure  Help needed turning from your back to your side while in a flat bed without using bedrails?: Total Help needed moving from lying on your back to sitting on the side of a flat bed without using bedrails?: Total Help needed moving to and from a bed to a chair (including a wheelchair)?: Total Help needed standing up from a chair using your arms (e.g., wheelchair or bedside chair)?: Total Help needed to walk in hospital room?: Total Help needed climbing 3-5 steps with a railing? : Total 6 Click Score: 6    End of Session   Activity Tolerance: Patient tolerated treatment well Patient left: in bed;with call bell/phone within reach;with bed alarm set;with SCD's reapplied (hip abduction wedge in place)   PT Visit Diagnosis: Unsteadiness on feet (R26.81);History of falling (Z91.81);Muscle weakness (generalized) (M62.81);Difficulty in walking, not elsewhere classified (R26.2);Pain Pain - Right/Left: Left Pain - part of body: Hip     Time: 8672-8661 PT Time Calculation (min) (ACUTE ONLY): 11 min  Charges:    $Therapeutic Activity: 8-22 mins PT General Charges $$ ACUTE PT VISIT: 1 Visit                     Richerd Pinal, PT, DPT 12/26/23, 1:44 PM   Richerd CHRISTELLA Pinal 12/26/2023, 1:43 PM

## 2023-12-27 DIAGNOSIS — B9689 Other specified bacterial agents as the cause of diseases classified elsewhere: Secondary | ICD-10-CM

## 2023-12-27 DIAGNOSIS — T8452XA Infection and inflammatory reaction due to internal left hip prosthesis, initial encounter: Secondary | ICD-10-CM

## 2023-12-27 DIAGNOSIS — M00851 Arthritis due to other bacteria, right hip: Secondary | ICD-10-CM

## 2023-12-27 DIAGNOSIS — S73005A Unspecified dislocation of left hip, initial encounter: Secondary | ICD-10-CM | POA: Diagnosis not present

## 2023-12-27 LAB — C-REACTIVE PROTEIN: CRP: 19.1 mg/dL — ABNORMAL HIGH (ref <1.0)

## 2023-12-27 LAB — SEDIMENTATION RATE: Sed Rate: 108 mm/h — ABNORMAL HIGH (ref 0–22)

## 2023-12-27 MED ORDER — SODIUM CHLORIDE 0.9 % IV SOLN
2.0000 g | INTRAVENOUS | Status: DC
  Administered 2023-12-27 – 2023-12-29 (×3): 2 g via INTRAVENOUS
  Filled 2023-12-27 (×3): qty 20

## 2023-12-27 MED ORDER — CEFAZOLIN SODIUM-DEXTROSE 2-4 GM/100ML-% IV SOLN
2.0000 g | Freq: Two times a day (BID) | INTRAVENOUS | Status: DC
  Administered 2023-12-27: 2 g via INTRAVENOUS
  Filled 2023-12-27: qty 100

## 2023-12-27 NOTE — TOC Progression Note (Addendum)
 Transition of Care [facility]) - Progression Note    Patient Details  Name: Madison Martinez MRN: [id number] Date of Birth: 1927-06-11  Transition of Care [facility]) CM/SW Contact  Bridget Cordella Simmonds, LCSW Phone Number: 12/27/2023, 1:52 PM  Clinical Narrative:   Message from [facility]: no bed available, bed will be available Friday.  Son Max updated.  Expected Discharge Plan: Skilled Nursing Facility Barriers to Discharge: Continued Medical Work up               Expected Discharge Plan and Services In-house Referral: Clinical Social Work   Post Acute Care Choice: Skilled Nursing Facility Living arrangements for the past 2 months: Single Family Home                                       Social Drivers of Health (SDOH) Interventions SDOH Screenings   Food Insecurity: Patient Unable To Answer (12/19/2023)  Housing: Unknown (12/19/2023)  Transportation Needs: No Transportation Needs (12/19/2023)  Utilities: Not At Risk (12/19/2023)  Alcohol Screen: Low Risk  (08/12/2020)  Depression (PHQ2-9): Medium Risk (08/11/2023)  Financial Resource Strain: Low Risk  (08/31/2022)  Physical Activity: Insufficiently Active (08/31/2022)  Social Connections: Socially Isolated (12/19/2023)  Stress: No Stress Concern Present (08/31/2022)  Tobacco Use: Low Risk  (12/23/2023)    Readmission Risk Interventions     No data to display

## 2023-12-27 NOTE — Plan of Care (Signed)
  Problem: Activity: Goal: Risk for activity intolerance will decrease Outcome: Not Progressing   Problem: Clinical Measurements: Goal: Will remain free from infection Outcome: Not Progressing   Problem: Elimination: Goal: Will not experience complications related to urinary retention Outcome: Not Progressing   Problem: Pain Managment: Goal: General experience of comfort will improve and/or be controlled Outcome: Not Progressing   Problem: Safety: Goal: Ability to remain free from injury will improve Outcome: Not Progressing

## 2023-12-27 NOTE — Consult Note (Signed)
 [facility]    Date of Admission:  12/18/2023         Reason for Consult: Rule out septic arthritis   Referring Provider: Dr. Edna Valentin HERO Buddenhagen is a 89 y.o. female who was brought to the ER on 12/18/2023 for complaints of left hip pain.  Patient apparently rolled sideways in the bed and dislocated. She has history of diabetes mellitus, hypertension, hyperlipidemia, gait issues, CVA, spinal stenosis as well as left eye blindness.  Patient was found to have left hip dislocation.  The left hip was reduced under sedation in the ED.  Patient was found to have mild leukocytosis.  Patient apparently was admitted from 12/11/2023 to 12/14/2023 and was evaluated by orthopedic surgery who had recommended outpatient follow-up and nonweightbearing, knee immobilizer to limit hip motion range.  Patient apparently was discharged to SNF after physical therapy and Occupational Therapy evaluation. Patient underwent left hip arthroplasty revision acetabular component and head ball on 12/23/2023.  The diagnosis was chronic left hip instability after total of arthroplasty which was done 20 years back. Unfortunately the cultures have grown rare Moraxella species, beta-lactamase positive.  So ID consult has been called.  Assessment:  Status post left hip arthroplasty 20 years back. Status post fall and admission to the hospital. Status post left hip arthroplasty revision acetabular component and head ball on 12/23/2023, postop [place] #4. Cultures grew Moraxella.  Beta-lactamase positive. Patient with left hip septic arthritis and osteomyelitis. Patient probably had some bronchitis or pneumonia which has resulted in hematogenous spread and then a late prosthetic joint infection. Management is ideally surgical debridement often requiring prosthesis removal as well as a lengthy course of antibiotics. Patient has some dementia and is not a good historian Plan: Recommend  Ceftriaxone  2 g IV daily. Patient will need long-term IV antibiotics. Baseline ESR and CRP. Will follow-up on the final cultures and sensitivity. Will also send blood cultures x 2.  Principal Problem:   Hip dislocation, left (HCC) Active Problems:   Non-insulin  dependent type 2 diabetes mellitus (HCC)   Essential hypertension   Hyperlipidemia   CKD stage 3b, GFR 30-44 ml/min (HCC)   H/O total hip arthroplasty, left   History of dementia   History of CVA (cerebrovascular accident)   Closed dislocation of left hip (HCC)   Acute cystitis    acetaminophen   1,000 mg Oral Q8H   Or   acetaminophen   650 mg Rectal Q8H   aspirin  EC  81 mg Oral BID   clopidogrel   75 mg Oral Daily   pravastatin   40 mg Oral q1800   QUEtiapine   50 mg Oral QHS   sodium chloride  flush  3 mL Intravenous Q12H   sodium chloride  flush  3 mL Intravenous Q12H    HPI: Izella M Ige is a 89 y.o. female who was brought to the ER on 12/18/2023 for complaints of left hip pain.  Patient apparently rolled sideways in the bed and dislocated. She has history of diabetes mellitus, hypertension, hyperlipidemia, gait issues, CVA, spinal stenosis as well as left eye blindness.  Patient was found to have left hip dislocation.  The left hip was reduced under sedation in the ED.  Patient was found to have mild leukocytosis.  Patient apparently was admitted from 12/11/2023 to 12/14/2023 and was evaluated by orthopedic surgery who had recommended outpatient follow-up and nonweightbearing, knee immobilizer to limit hip motion range.  Patient apparently was discharged to  SNF after physical therapy and Occupational Therapy evaluation. Patient underwent left hip arthroplasty revision acetabular component and head ball on 12/23/2023.  The diagnosis was chronic left hip instability after total of arthroplasty which was done 20 years back. Unfortunately the cultures have grown rare Moraxella species, beta-lactamase positive.  So ID consult has  been called.   Review of Systems: Review of Systems  Constitutional:  Positive for malaise/fatigue.       She is alert and awake.  She is able to answer some questions appropriately.  HENT: Negative.         Patient is blind.  Respiratory: Negative.    Cardiovascular: Negative.   Gastrointestinal: Negative.   Genitourinary: Negative.   Musculoskeletal: Negative.   Skin: Negative.   Neurological: Negative.   Endo/Heme/Allergies: Negative.   Psychiatric/Behavioral: Negative.  Negative for memory loss.     Past Medical History:  Diagnosis Date   Blind hypertensive eye, left 04/18/2012   Changing skin lesion 11/23/2018   CVA (cerebral vascular accident) [facility])    Gait disturbance 10/05/2015   pt declined neuro referral per records   Generalized weakness 05/15/2019   Hard of hearing 12/02/2015   hearing aids   Hyperlipidemia    Hypertension    Insomnia    tried melatonin, advil PM, xanax and klonopin without success   Leukocytosis 11/07/2013   seen oncology, did not want to continue to follow there, so continued follow up at PCP.   Osteoporosis    Polyneuropathy    Primary open angle glaucoma 01/2011   Spinal stenosis    Stable branch retinal vein occlusion (HCC) 01/06/2012   Type 2 diabetes mellitus (HCC)    diet controlled    Social History   Tobacco Use   Smoking status: Never   Smokeless tobacco: Never  Vaping Use   Vaping status: Never Used  Substance Use Topics   Alcohol use: No   Drug use: No    Family History  Problem Relation Age of Onset   Heart disease Brother    Cancer Other        prostate   Ovarian cancer Neg Hx    Breast cancer Neg Hx    Uterine cancer Neg Hx    Colon cancer Neg Hx    Allergies  Allergen Reactions   Bayer Aspirin  [Aspirin ] Other (See Comments)    Per patient - told avoid while taking Plavix .   Influenza Virus Vaccine Swelling    Localized swelling at injection site   Motrin [Ibuprofen] Other (See Comments)    Per  patient - told avoid while taking Plavix  by cardiology   Norvasc [Amlodipine Besylate] Other (See Comments)    Vertigo    OBJECTIVE: Blood pressure (!) 150/80, pulse 89, temperature 98.7 F (37.1 C), resp. rate 18, height 5' 4.02 (1.626 m), weight 59.2 kg, last menstrual period 03/21/1978, SpO2 98%.  Physical Exam Constitutional:      Comments: Very pleasant patient.  She is not in any acute distress.  HENT:     Head: Normocephalic.     Right Ear: External ear normal.     Left Ear: External ear normal.     Mouth/Throat:     Mouth: Mucous membranes are moist.  Cardiovascular:     Rate and Rhythm: Normal rate and regular rhythm.  Pulmonary:     Effort: Pulmonary effort is normal.     Breath sounds: Normal breath sounds.  Abdominal:     General: Bowel sounds are normal.  Musculoskeletal:     Cervical back: Normal range of motion.     Comments: Left hip dressing and wound VAC is intact.  Skin:    General: Skin is warm.  Neurological:     Mental Status: She is oriented to person, place, and time.  Psychiatric:        Mood and Affect: Mood normal.        Behavior: Behavior normal.        Thought Content: Thought content normal.        Judgment: Judgment normal.     Lab Results Lab Results  Component Value Date   WBC 13.4 (H) 12/25/2023   HGB 8.2 (L) 12/25/2023   HCT 25.6 (L) 12/25/2023   MCV 96.6 12/25/2023   PLT 209 12/25/2023    Lab Results  Component Value Date   CREATININE 1.22 (H) 12/25/2023   BUN 29 (H) 12/25/2023   NA 132 (L) 12/25/2023   K 4.3 12/25/2023   CL 103 12/25/2023   CO2 21 (L) 12/25/2023    Lab Results  Component Value Date   ALT 15 12/19/2023   AST 23 12/19/2023   ALKPHOS 84 12/19/2023   BILITOT 0.5 12/19/2023     Microbiology: Recent Results (from the past 240 hours)  MRSA Next Gen by PCR, Nasal     Status: None   Collection Time: 12/19/23 12:43 PM   Specimen: Nasal Mucosa; Nasal Swab  Result Value Ref Range Status   MRSA by PCR  Next Gen NOT DETECTED NOT DETECTED Final    Comment: (NOTE) The GeneXpert MRSA Assay (FDA approved for NASAL specimens only), is one component of a comprehensive MRSA colonization surveillance program. It is not intended to diagnose MRSA infection nor to guide or monitor treatment for MRSA infections. Test performance is not FDA approved in patients less than 89 years old. Performed at [facility], [address], [place], [place] [address]   Aerobic/Anaerobic Culture w Gram Stain (surgical/deep wound)     Status: None (Preliminary result)   Collection Time: 12/23/23  8:52 AM   Specimen: Soft Tissue, Other  Result Value Ref Range Status   Specimen Description TISSUE  Final   Special Requests LEFT HIP  Final   Gram Stain   Final    RARE WBC SEEN RARE SQUAMOUS EPITHELIAL CELLS PRESENT NO ORGANISMS SEEN Performed at [facility], [address] [place], [place] [address]    Culture   Final    RARE MORAXELLA SPECIES BETA LACTAMASE POSITIVE NO ANAEROBES ISOLATED; CULTURE IN PROGRESS FOR 5 DAYS    Report Status PENDING  Incomplete  Aerobic/Anaerobic Culture w Gram Stain (surgical/deep wound)     Status: None (Preliminary result)   Collection Time: 12/23/23  8:53 AM   Specimen: Soft Tissue, Other  Result Value Ref Range Status   Specimen Description TISSUE  Final   Special Requests LEFT HIP  Final   Gram Stain RARE WBC SEEN NO ORGANISMS SEEN   Final   Culture   Final    NO GROWTH 4 DAYS NO ANAEROBES ISOLATED; CULTURE IN PROGRESS FOR 5 DAYS Performed at [facility], [address] [place], [place] [address]    Report Status PENDING  Incomplete  Aerobic/Anaerobic Culture w Gram Stain (surgical/deep wound)     Status: None (Preliminary result)   Collection Time: 12/23/23  8:53 AM   Specimen: Soft Tissue, Other  Result Value Ref Range Status   Specimen Description TISSUE  Final   Special Requests LEFT HIP  Final   Gram Stain   Final    FEW WBC PRESENT,BOTH  PMN AND MONONUCLEAR NO ORGANISMS SEEN    Culture   Final    NO GROWTH 4 DAYS NO ANAEROBES ISOLATED; CULTURE IN PROGRESS FOR 5 DAYS Performed at [facility], [address], [place], [place] [address]    Report Status PENDING  Incomplete    Katharine Metro, MD  [facility] Cell phone- [phone]  12/27/2023 4:35 PM    Total Encounter Time: 80 minutes

## 2023-12-27 NOTE — Progress Notes (Addendum)
 PROGRESS NOTE    Madison Martinez  FMW:[id number] DOB: 05-29-1927 DOA: 12/18/2023 PCP: Catherine Charlies LABOR, DO   Brief Narrative:    89 y.o. female  history of dementia, DM type II, hypertension, hyperlipidemia, CKD 3B, blindness of the left eye, CVA, and recent hip dislocation due to mechanical fall which was reduced in the ED and discharged from the hospital 9/25.  She presented to ED on 9/29 via EMS after a fall with recurrent left hip pain, x-ray at the rehab facility showed recurrent left hip dislocation.  Patient was seen by orthopedics in the ED and the hip was reduced.   -status post left hip arthoplasty revision on 10/4 with Dr. Edna  -DC back to SNF on 10/10  Assessment & Plan:  Principal Problem:   Hip dislocation, left (HCC) Active Problems:   Non-insulin  dependent type 2 diabetes mellitus (HCC)   Essential hypertension   Hyperlipidemia   CKD stage 3b, GFR 30-44 ml/min (HCC)   H/O total hip arthroplasty, left   History of dementia   History of CVA (cerebrovascular accident)   Closed dislocation of left hip (HCC)   Acute cystitis   Left prosthetic hip dislocation - recurrent History of left hip arthroplasty History of recurrent left prosthetic hip dislocation Dislocated hip was reduced in ED by Dr. Ginger.   Repeat x-ray showing reduction of the left hip arthroplasty.  No acute evidence of fracture. --Ortho following, appreciate recommendations: --status post left hip arthoplasty revision on 10/4 with Dr. Edna --Follow post-op recommendations from Ortho --Resumed plavix  on 10/6 --Pain control per orders - changed to scheduled Tylenol  and PRN tramadol  or oxy --PT/OT  --DC back to SNF on 10/10 -Intraoperative left hip culture grew Rare Moraxella species, beta lactamase positive. Consulted ID on 10/8.   ABLA Iron deficiency --Post op Hbg 10 >> 8.7 >> 8.2 --Iron studies reflect deficiency --IV iron infusion received on 10/5 --Trend Hbg, transfuse if < 7 or  actively bleeding.   History of CVA - Resumed plavix  on 10/6   Non-insulin -dependent DM type II -Previously patient was diabetic.  A1c 6.2.  Continue heart healthy carb modified diet   Essential hypertension - BP's controlled, but labile soft at times after surgery 10/4 --Holding lisinopril  and Toprol -XL, resume when BP will tolerate   Hyperlipidemia -Continue Pravachol    History of dementia Generalized anxiety disorder - Continue Seroquel  at bedtime   Acute cystitis - s/p amoxicillin    Hyperlipidemia History of CVA   CKD stage 3B Renal function at baseline. --Monitor  Disposition: SNF ([facility] [place]) on 10/10 as per TOC.  DVT prophylaxis: SCDs Start: 12/19/23 0150 Place TED hose Start: 12/19/23 0150     Code Status: Full Code Family Communication:  None at the bedside Status is: Inpatient Remains inpatient appropriate because:Pending SNF    Subjective:  No acute events overnight. Appears drowsy but in no acute distress.   Examination:  General exam: Appears slightly drowsy, hard of hearing  Respiratory system: Clear to auscultation. Respiratory effort normal. Cardiovascular system: S1 & S2 heard, RRR. No JVD, murmurs, rubs, gallops or clicks. No pedal edema. Gastrointestinal system: Abdomen is nondistended, soft and nontender. No organomegaly or masses felt. Normal bowel sounds heard. Central nervous system: Slightly drowsy and confused. No focal neurological deficits. Extremities: Left hip dressing and wound vac in place     Diet Orders (From admission, onward)     Start     Ordered   12/23/23 1146  Diet regular Room service appropriate?  Yes; Fluid consistency: Thin  Diet effective now       Question Answer Comment  Room service appropriate? Yes   Fluid consistency: Thin      12/23/23 1145            Objective: Vitals:   12/26/23 1417 12/26/23 2024 12/27/23 0311 12/27/23 0728  BP: (!) 141/62 (!) 149/62 (!) 120/51 (!) 109/50   Pulse: 82 80 82 76  Resp: 16 16 17 17   Temp: 98 F (36.7 C) 98.5 F (36.9 C) 97.6 F (36.4 C) (!) 97.4 F (36.3 C)  TempSrc:  Oral Oral   SpO2: 99% 98% 97% 98%  Weight:      Height:        Intake/Output Summary (Last 24 hours) at 12/27/2023 1002 Last data filed at 12/27/2023 0951 Gross per 24 hour  Intake 368 ml  Output --  Net 368 ml   Filed Weights   12/19/23 1243 12/23/23 0746  Weight: 59.2 kg 59.2 kg    Scheduled Meds:  acetaminophen   1,000 mg Oral Q8H   Or   acetaminophen   650 mg Rectal Q8H   aspirin  EC  81 mg Oral BID   clopidogrel   75 mg Oral Daily   pravastatin   40 mg Oral q1800   QUEtiapine   50 mg Oral QHS   sodium chloride  flush  3 mL Intravenous Q12H   sodium chloride  flush  3 mL Intravenous Q12H   Continuous Infusions:   ceFAZolin (ANCEF) IV 2 g (12/27/23 0951)   sodium chloride  Stopped (12/24/23 9147)    Nutritional status     Body mass index is 22.39 kg/m.  Data Reviewed:   CBC: Recent Labs  Lab 12/21/23 0413 12/23/23 1326 12/24/23 0428 12/25/23 0015  WBC 10.5 13.4* 15.0* 13.4*  HGB 10.5* 10.0* 8.7* 8.2*  HCT 32.5* 31.3* 26.3* 25.6*  MCV 94.8 96.6 94.3 96.6  PLT 212 224 221 209   Basic Metabolic Panel: Recent Labs  Lab 12/21/23 0413 12/23/23 1326 12/25/23 0015  NA 135 135 132*  K 3.8 4.9 4.3  CL 105 103 103  CO2 21* 22 21*  GLUCOSE 113* 188* 168*  BUN 23 28* 29*  CREATININE 0.96 1.20* 1.22*  CALCIUM 8.7* 8.9 8.3*   GFR: Estimated Creatinine Clearance: 24.3 mL/min (A) (by C-G formula based on SCr of 1.22 mg/dL (H)). Liver Function Tests: No results for input(s): AST, ALT, ALKPHOS, BILITOT, PROT, ALBUMIN in the last 168 hours.  No results for input(s): LIPASE, AMYLASE in the last 168 hours. No results for input(s): AMMONIA in the last 168 hours. Coagulation Profile: No results for input(s): INR, PROTIME in the last 168 hours. Cardiac Enzymes: No results for input(s): CKTOTAL, CKMB,  CKMBINDEX, TROPONINI in the last 168 hours. BNP (last 3 results) No results for input(s): PROBNP in the last 8760 hours. HbA1C: No results for input(s): HGBA1C in the last 72 hours. CBG: Recent Labs  Lab 12/23/23 0729 12/23/23 1130  GLUCAP 117* 169*   Lipid Profile: No results for input(s): CHOL, HDL, LDLCALC, TRIG, CHOLHDL, LDLDIRECT in the last 72 hours. Thyroid  Function Tests: No results for input(s): TSH, T4TOTAL, FREET4, T3FREE, THYROIDAB in the last 72 hours. Anemia Panel: No results for input(s): VITAMINB12, FOLATE, FERRITIN, TIBC, IRON, RETICCTPCT in the last 72 hours.  Sepsis Labs: No results for input(s): PROCALCITON, LATICACIDVEN in the last 168 hours.  Recent Results (from the past 240 hours)  MRSA Next Gen by PCR, Nasal     Status: None  Collection Time: 12/19/23 12:43 PM   Specimen: Nasal Mucosa; Nasal Swab  Result Value Ref Range Status   MRSA by PCR Next Gen NOT DETECTED NOT DETECTED Final    Comment: (NOTE) The GeneXpert MRSA Assay (FDA approved for NASAL specimens only), is one component of a comprehensive MRSA colonization surveillance program. It is not intended to diagnose MRSA infection nor to guide or monitor treatment for MRSA infections. Test performance is not FDA approved in patients less than 93 years old. Performed at [facility], [address], [place], [place] [address]   Aerobic/Anaerobic Culture w Gram Stain (surgical/deep wound)     Status: None (Preliminary result)   Collection Time: 12/23/23  8:52 AM   Specimen: Soft Tissue, Other  Result Value Ref Range Status   Specimen Description TISSUE  Final   Special Requests LEFT HIP  Final   Gram Stain   Final    RARE WBC SEEN RARE SQUAMOUS EPITHELIAL CELLS PRESENT NO ORGANISMS SEEN Performed at [facility], [address], [place], [place] [address]    Culture   Final    RARE MORAXELLA SPECIES BETA LACTAMASE POSITIVE NO  ANAEROBES ISOLATED; CULTURE IN PROGRESS FOR 5 DAYS    Report Status PENDING  Incomplete  Aerobic/Anaerobic Culture w Gram Stain (surgical/deep wound)     Status: None (Preliminary result)   Collection Time: 12/23/23  8:53 AM   Specimen: Soft Tissue, Other  Result Value Ref Range Status   Specimen Description TISSUE  Final   Special Requests LEFT HIP  Final   Gram Stain RARE WBC SEEN NO ORGANISMS SEEN   Final   Culture   Final    NO GROWTH 4 DAYS NO ANAEROBES ISOLATED; CULTURE IN PROGRESS FOR 5 DAYS Performed at [facility], [address], [place], [place] [address]    Report Status PENDING  Incomplete  Aerobic/Anaerobic Culture w Gram Stain (surgical/deep wound)     Status: None (Preliminary result)   Collection Time: 12/23/23  8:53 AM   Specimen: Soft Tissue, Other  Result Value Ref Range Status   Specimen Description TISSUE  Final   Special Requests LEFT HIP  Final   Gram Stain   Final    FEW WBC PRESENT,BOTH PMN AND MONONUCLEAR NO ORGANISMS SEEN    Culture   Final    NO GROWTH 4 DAYS NO ANAEROBES ISOLATED; CULTURE IN PROGRESS FOR 5 DAYS Performed at [facility], [address] [place], [place] [address]    Report Status PENDING  Incomplete         Radiology Studies: No results found.     LOS: 5 days   Time spent= 36 mins    Deliliah Room, MD [facility]  If 7PM-7AM, please contact night-coverage  12/27/2023, 10:02 AM

## 2023-12-27 NOTE — Progress Notes (Signed)
     4 Days Post-Op Procedure(s) (LRB): REVISION, TOTAL ARTHROPLASTY, HIP, ACETABULAR COMPONENT (Left)   Subjective: Patient lying comfortably in bed. Limited participation with PT due to dementia and HOH.   Abduction pillow in place.   Objective:   VITALS:   Vitals:   12/26/23 0802 12/26/23 1417 12/26/23 2024 12/27/23 0311  BP: (!) 125/95 (!) 141/62 (!) 149/62 (!) 120/51  Pulse: 85 82 80 82  Resp: 16 16 16 17   Temp: 98.7 F (37.1 C) 98 F (36.7 C) 98.5 F (36.9 C) 97.6 F (36.4 C)  TempSrc:   Oral Oral  SpO2: 99% 99% 98% 97%  Weight:      Height:        Resting comfortably in bed, abduction pillow in place, in NAD Neurologically intact Neurovascular intact Intact pulses distally Dorsiflexion/Plantar flexion intact though mildly diminished of surgery leg Incision: dressing C/D/I; wound vac holding suction,  Leg legnths clinically equal Compartment soft Wiggles toes appropriately   Lab Results  Component Value Date   WBC 13.4 (H) 12/25/2023   HGB 8.2 (L) 12/25/2023   HCT 25.6 (L) 12/25/2023   MCV 96.6 12/25/2023   PLT 209 12/25/2023   BMET    Component Value Date/Time   NA 132 (L) 12/25/2023 0015   NA 146 04/18/2016 0000   K 4.3 12/25/2023 0015   CL 103 12/25/2023 0015   CO2 21 (L) 12/25/2023 0015   GLUCOSE 168 (H) 12/25/2023 0015   BUN 29 (H) 12/25/2023 0015   BUN 20 04/18/2016 0000   CREATININE 1.22 (H) 12/25/2023 0015   CREATININE 0.91 (H) 02/26/2020 1431   CALCIUM 8.3 (L) 12/25/2023 0015   GFRNONAA 41 (L) 12/25/2023 0015   GFRNONAA 50 (L) 09/14/2016 0800    Yesterday's total administered Morphine  Milligram Equivalents: 7.5  Xray: stable post-operative imaging  Assessment/Plan: 4 Days Post-Op   S/P Left hip arthroplasty revision acetabular component and head ball  due to instability and recurrent dislocation  Principal Problem:   Hip dislocation, left (HCC) Active Problems:   Non-insulin  dependent type 2 diabetes mellitus (HCC)    Essential hypertension   Hyperlipidemia   CKD stage 3b, GFR 30-44 ml/min (HCC)   H/O total hip arthroplasty, left   History of dementia   History of CVA (cerebrovascular accident)   Closed dislocation of left hip (HCC)   Acute cystitis   Hgb 8.7, continue to monitor for now.  If becomes symptomatic or Hgb < 7.0, may consider transfusion and/or holding DVT prophylaxis as needed.   Post op recs: WB: 20% PWB LLE, posterior hip precautions with hip abduction pillow when in bed Abx: ancef while in house, follow-up IntraOp cultures, discharged on extend antibiotic prophylaxis  - 1/3 cxs with RARE MORAXELLA SPECIES  BETA LACTAMASE POSITIVE , possible false positive, recommend ID input. Imaging: PACU pelvis Xray Dressing: Prevena incisional wound VAC to stay on for 1 week DVT prophylaxis: Aspirin  81BID starting POD1 and resume Plavix  postop [place] 2 Follow up: 2 weeks after surgery for a wound check with Dr. Edna at [facility].  Address: [address], [place], [place] [address]  Office Phone: [phone]   Jeffery Bachmeier A Marykatherine Sherwood 12/27/2023, 7:09 AM   Contact information:   Weekdays 7am-5pm epic message Dr. Edna, or call office for patient follow up: [phone] After hours and holidays please check [email or web address] for group call information for [facility]

## 2023-12-28 DIAGNOSIS — S73005A Unspecified dislocation of left hip, initial encounter: Secondary | ICD-10-CM | POA: Diagnosis not present

## 2023-12-28 LAB — AEROBIC/ANAEROBIC CULTURE W GRAM STAIN (SURGICAL/DEEP WOUND)
Culture: NO GROWTH
Culture: NO GROWTH
Culture: NO GROWTH

## 2023-12-28 NOTE — TOC Progression Note (Addendum)
 Transition of Care [facility]) - Progression Note    Patient Details  Name: Madison Martinez MRN: [id number] Date of Birth: 1927/10/07  Transition of Care [facility]) CM/SW Contact  Bridget Cordella Simmonds, LCSW Phone Number: 12/28/2023, 1:27 PM  Clinical Narrative:   CSW informed by MD that pt will DC on IV abx.  CSW confirmed with Krist[facility] that they can receive pt on IV abx. Message back from Kristin stating they are not longer going to be able to accept pt due to her needing LTC.  CSW confirmed with pt son Max, pt does not have medicaid, he is not planning on LTC at [facility]. Allean informed of this but reports that the facility CSW brought up concerns based on pt last admission and they are not able to take her back.  Discussed that pt has been waiting on this bed several days, Allean reports she was just informed of this at her morning meeting today.   Referral sent out to additional SNFs.   1430: Son Max informed [facility] will not take pt back.  Discussed current bed offers, he would like additional options.  CSW reached out to other facilities that have not yet responded.  1600: TC Max, updated bed offers provided, he will accept offer at [facility].  CSW confirmed with T[facility].  Auth request submitted in [place].  Need PT note with mobility, PT aware and can see pt tomorrw.   Expected Discharge Plan: Skilled Nursing Facility Barriers to Discharge: Continued Medical Work up               Expected Discharge Plan and Services In-house Referral: Clinical Social Work   Post Acute Care Choice: Skilled Nursing Facility Living arrangements for the past 2 months: Single Family Home                                       Social Drivers of Health (SDOH) Interventions SDOH Screenings   Food Insecurity: Patient Unable To Answer (12/19/2023)  Housing: Unknown (12/19/2023)  Transportation Needs: No Transportation Needs (12/19/2023)  Utilities: Not At Risk (12/19/2023)   Alcohol Screen: Low Risk  (08/12/2020)  Depression (PHQ2-9): Medium Risk (08/11/2023)  Financial Resource Strain: Low Risk  (08/31/2022)  Physical Activity: Insufficiently Active (08/31/2022)  Social Connections: Socially Isolated (12/19/2023)  Stress: No Stress Concern Present (08/31/2022)  Tobacco Use: Low Risk  (12/23/2023)    Readmission Risk Interventions     No data to display

## 2023-12-28 NOTE — Progress Notes (Signed)
 Occupational Therapy Treatment Patient Details Name: Madison Martinez Noell MRN: [id number] DOB: Sep 16, 1927 Today's Date: 12/28/2023   History of present illness Madison Martinez is a 89 y.o. female admitted 12/18/23 for left hip dislocation. X-rays pelvis/hip demonstrate a concentrically reduced left hip arthroplasty and notably the acetabular component is lateralized and vertical, which may predispose her to continued instability and dislocation. Plan for conservative measures hopeful to avoid additional surgery. Pt had repeat dislocation during hospitalization resulting OR for L THA revision 10/4. PMHx: T2DM, HTN, HLD, L eye blindness, CVA with residual gait abnormality, osteoporosis, polyneuropathy, spinal stenosis, and dementia. Of note, recent hospitalization 9/22-9/25 for L hip dislocation after fall at home. Pt was reduced in ED and Ortho recommend non-operative mgmt, knee immobilizer, WBAT and outpatient follow up.   OT comments  Pt pleasant and grateful for assistance. Fearful of imposed movement at bed level and having moderate L hip pain with rolling for pericare and linen change. Requires +2 total assist. Pt participated in 3 grooming activities at bed level with min assist. Pt's poor hearing is a limiting factor in her ability to participate maximally in mobility. Pt unlikely to be able to maintain PWB on L LE for standing, maximove recommended for OOB after pt is adequately medicated for pain. Patient will benefit from continued inpatient follow up therapy, <3 hours/Hase       If plan is discharge home, recommend the following:  Two people to help with walking and/or transfers;Two people to help with bathing/dressing/bathroom;Assist for transportation;Help with stairs or ramp for entrance;Assistance with cooking/housework;Direct supervision/assist for medications management;Direct supervision/assist for financial management   Equipment Recommendations  Other (comment) (defer)    Recommendations for  Other Services      Precautions / Restrictions Precautions Precautions: Fall;Posterior Hip Recall of Precautions/Restrictions: Impaired Precaution/Restrictions Comments: educated pt in posterior hip precautions Required Braces or Orthoses: Other Brace Other Brace: LE abduction pillow in bed Restrictions Weight Bearing Restrictions Per Provider Order: Yes LLE Weight Bearing Per Provider Order: Partial weight bearing LLE Partial Weight Bearing Percentage or Pounds: 20%       Mobility Bed Mobility Overal bed mobility: Needs Assistance Bed Mobility: Rolling Rolling: +2 for physical assistance, Total assist         General bed mobility comments: assist for all aspects of bed level mobility with hip abduction pillow donned    Transfers                         Balance                                           ADL either performed or assessed with clinical judgement   ADL Overall ADL's : Needs assistance/impaired Eating/Feeding: Set up;Bed level   Grooming: Wash/dry hands;Wash/dry face;Brushing hair;Oral care;Bed level;Minimal assistance                       Toileting- Clothing Manipulation and Hygiene: +2 for physical assistance;Total assistance;Bed level              Extremity/Trunk Assessment              Vision       Perception     Praxis     Communication Communication Communication: Impaired Factors Affecting Communication: Hearing impaired   Cognition Arousal: Alert Behavior During Therapy: [facility]  for tasks assessed/performed Cognition: History of cognitive impairments             OT - Cognition Comments: pt with significant hearing loss interfering with ability to follow commands                 Following commands: Impaired Following commands impaired: Follows one step commands inconsistently      Cueing   Cueing Techniques: Verbal cues, Gestural cues, Tactile cues  Exercises       Shoulder Instructions       General Comments      Pertinent Vitals/ Pain       Pain Assessment Pain Assessment: Faces Faces Pain Scale: Hurts even more Pain Location: LLE with movement Pain Descriptors / Indicators: Grimacing, Guarding, Discomfort Pain Intervention(s): Monitored during session, Repositioned  Home Living                                          Prior Functioning/Environment              Frequency  Min 2X/week        Progress Toward Goals  OT Goals(current goals can now be found in the care plan section)  Progress towards OT goals: Not progressing toward goals - comment  Acute Rehab OT Goals OT Goal Formulation: With patient Time For Goal Achievement: 01/07/24 Potential to Achieve Goals: Fair  Plan      Co-evaluation                 AM-PAC OT 6 Clicks Daily Activity     Outcome Measure   Help from another person eating meals?: A Little Help from another person taking care of personal grooming?: A Little Help from another person toileting, which includes using toliet, bedpan, or urinal?: Total Help from another person bathing (including washing, rinsing, drying)?: A Lot Help from another person to put on and taking off regular upper body clothing?: A Little Help from another person to put on and taking off regular lower body clothing?: Total 6 Click Score: 13    End of Session    OT Visit Diagnosis: Muscle weakness (generalized) (M62.81);Pain;Other symptoms and signs involving cognitive function Pain - Right/Left: Left Pain - part of body: Hip   Activity Tolerance Patient limited by pain   Patient Left in bed;with call bell/phone within reach;with bed alarm set   Nurse Communication Mobility status        Time: 8790-8761 OT Time Calculation (min): 29 min  Charges: OT General Charges $OT Visit: 1 Visit OT Treatments $Self Care/Home Management : 8-22 mins $Therapeutic Activity: 8-22 mins  Mliss HERO,  OTR/L Acute Rehabilitation Services Office: [phone]   Kennth Mliss Helling 12/28/2023, 1:08 PM

## 2023-12-28 NOTE — Progress Notes (Signed)
 PHARMACY CONSULT NOTE FOR:  OUTPATIENT  PARENTERAL ANTIBIOTIC THERAPY (OPAT)  Indication: Hip infection with Moraxella species (beta lactamase positive) on 10/4 left hip tissue culture Regimen: Ceftriaxone  IV 2g q24h  End date: 02/07/2024  IV antibiotic discharge orders are pended. To discharging provider:  please sign these orders via discharge navigator,  Select New Orders & click on the button choice - Manage This Unsigned Work.    Thank you for allowing pharmacy to be a part of this patient's care.  Feliciano Close, PharmD PGY2 Infectious Diseases Pharmacy Resident  12/28/2023 1:25 PM

## 2023-12-28 NOTE — Progress Notes (Incomplete)
 PHARMACY CONSULT NOTE FOR:  OUTPATIENT  PARENTERAL ANTIBIOTIC THERAPY (OPAT)  Indication:  Regimen:  End date:   IV antibiotic discharge orders are pended. To discharging provider:  please sign these orders via discharge navigator,  Select New Orders & click on the button choice - Manage This Unsigned Work.     Thank you for allowing pharmacy to be a part of this patient's care.  Madison Martinez 12/28/2023, 8:05 AM

## 2023-12-28 NOTE — Plan of Care (Signed)

## 2023-12-28 NOTE — Progress Notes (Signed)
 PROGRESS NOTE    Madison Martinez  FMW:[id number] DOB: 08-Jun-1927 DOA: 12/18/2023 PCP: Catherine Charlies LABOR, DO   Brief Narrative:    89 y.o. female  history of dementia, DM type II, hypertension, hyperlipidemia, CKD 3B, blindness of the left eye, CVA, and recent hip dislocation due to mechanical fall which was reduced in the ED and discharged from the hospital 9/25.  She presented to ED on 9/29 via EMS after a fall with recurrent left hip pain, x-ray at the rehab facility showed recurrent left hip dislocation.  Patient was seen by orthopedics in the ED and the hip was reduced.   -status post left hip arthoplasty revision on 10/4 with Dr. Edna  - 10/8: ID consulted for management of Moraxella infection of the hip and she is on IV Rocephin  now.   Assessment & Plan:  Principal Problem:   Hip dislocation, left (HCC) Active Problems:   Non-insulin  dependent type 2 diabetes mellitus (HCC)   Essential hypertension   Hyperlipidemia   CKD stage 3b, GFR 30-44 ml/min (HCC)   H/O total hip arthroplasty, left   History of dementia   History of CVA (cerebrovascular accident)   Closed dislocation of left hip (HCC)   Acute cystitis   Prosthetic joint infection of left hip   Left prosthetic hip dislocation - recurrent History of left hip arthroplasty History of recurrent left prosthetic hip dislocation Dislocated hip was reduced in ED by Dr. Ginger.   Repeat x-ray showing reduction of the left hip arthroplasty.  No acute evidence of fracture. --Ortho following, appreciate recommendations: --status post left hip arthoplasty revision on 10/4 with Dr. Edna --Follow post-op recommendations from Ortho --Resumed plavix  on 10/6 --Pain control per orders - changed to scheduled Tylenol  and PRN tramadol  or oxy --PT/OT  --DC back to SNF on 10/10 -Intraoperative left hip culture grew Rare Moraxella species, beta lactamase positive. Consulted ID on 10/8.  Left hip septic arthritis and  osteomyelitis,POA: -Hip culture grew Rare Moraxella species, beta lactamase positive -Started on IV ceftriaxone  on 10/8. Will need long term antibiotics.  -Repeat blood cultures ordered on 10/8   ABLA Iron deficiency --Post op Hbg 10 >> 8.7 >> 8.2 --Iron studies reflect deficiency --IV iron infusion received on 10/5 --Trend Hbg, transfuse if < 7 or actively bleeding.   History of CVA - Resumed plavix  on 10/6   Non-insulin -dependent DM type II -Previously patient was diabetic.  A1c 6.2.  Continue heart healthy carb modified diet   Essential hypertension - BP's controlled, but labile soft at times after surgery 10/4 --Holding lisinopril  and Toprol -XL, resume when BP will tolerate   Hyperlipidemia -Continue Pravachol    History of dementia Generalized anxiety disorder - Continue Seroquel  at bedtime   Acute cystitis - s/p amoxicillin    Hyperlipidemia History of CVA   CKD stage 3B Renal function at baseline. --Monitor  Disposition: SNF ([facility] [place])   DVT prophylaxis: SCDs Start: 12/19/23 0150 Place TED hose Start: 12/19/23 0150     Code Status: Full Code Family Communication:  None at the bedside Status is: Inpatient Remains inpatient appropriate because:Pending SNF    Subjective:  No acute events overnight. Appears drowsy but in no acute distress.   Examination:  General exam: Appears slightly drowsy, hard of hearing  Respiratory system: Clear to auscultation. Respiratory effort normal. Cardiovascular system: S1 & S2 heard, RRR. No JVD, murmurs, rubs, gallops or clicks. No pedal edema. Gastrointestinal system: Abdomen is nondistended, soft and nontender. No organomegaly or masses felt. Normal  bowel sounds heard. Central nervous system: Slightly drowsy and confused. No focal neurological deficits. Extremities: Left hip dressing and wound vac in place     Diet Orders (From admission, onward)     Start     Ordered   12/23/23 1146  Diet  regular Room service appropriate? Yes; Fluid consistency: Thin  Diet effective now       Question Answer Comment  Room service appropriate? Yes   Fluid consistency: Thin      12/23/23 1145            Objective: Vitals:   12/27/23 1532 12/27/23 1941 12/28/23 0522 12/28/23 0720  BP: (!) 150/80 (!) 153/63 (!) 109/48 (!) 91/38  Pulse: 89 90 81 75  Resp: 18 15 14 18   Temp: 98.7 F (37.1 C) 98.8 F (37.1 C)  97.6 F (36.4 C)  TempSrc:  Oral    SpO2: 98% 97% 98% 97%  Weight:      Height:        Intake/Output Summary (Last 24 hours) at 12/28/2023 1036 Last data filed at 12/28/2023 0835 Gross per 24 hour  Intake 257.96 ml  Output 650 ml  Net -392.04 ml   Filed Weights   12/19/23 1243 12/23/23 0746  Weight: 59.2 kg 59.2 kg    Scheduled Meds:  acetaminophen   1,000 mg Oral Q8H   Or   acetaminophen   650 mg Rectal Q8H   aspirin  EC  81 mg Oral BID   clopidogrel   75 mg Oral Daily   pravastatin   40 mg Oral q1800   QUEtiapine   50 mg Oral QHS   sodium chloride  flush  3 mL Intravenous Q12H   sodium chloride  flush  3 mL Intravenous Q12H   Continuous Infusions:  cefTRIAXone  (ROCEPHIN )  IV 2 g (12/27/23 1757)   sodium chloride  Stopped (12/24/23 9147)    Nutritional status     Body mass index is 22.39 kg/m.  Data Reviewed:   CBC: Recent Labs  Lab 12/23/23 1326 12/24/23 0428 12/25/23 0015  WBC 13.4* 15.0* 13.4*  HGB 10.0* 8.7* 8.2*  HCT 31.3* 26.3* 25.6*  MCV 96.6 94.3 96.6  PLT 224 221 209   Basic Metabolic Panel: Recent Labs  Lab 12/23/23 1326 12/25/23 0015  NA 135 132*  K 4.9 4.3  CL 103 103  CO2 22 21*  GLUCOSE 188* 168*  BUN 28* 29*  CREATININE 1.20* 1.22*  CALCIUM 8.9 8.3*   GFR: Estimated Creatinine Clearance: 24.3 mL/min (A) (by C-G formula based on SCr of 1.22 mg/dL (H)). Liver Function Tests: No results for input(s): AST, ALT, ALKPHOS, BILITOT, PROT, ALBUMIN in the last 168 hours.  No results for input(s): LIPASE, AMYLASE  in the last 168 hours. No results for input(s): AMMONIA in the last 168 hours. Coagulation Profile: No results for input(s): INR, PROTIME in the last 168 hours. Cardiac Enzymes: No results for input(s): CKTOTAL, CKMB, CKMBINDEX, TROPONINI in the last 168 hours. BNP (last 3 results) No results for input(s): PROBNP in the last 8760 hours. HbA1C: No results for input(s): HGBA1C in the last 72 hours. CBG: Recent Labs  Lab 12/23/23 0729 12/23/23 1130  GLUCAP 117* 169*   Lipid Profile: No results for input(s): CHOL, HDL, LDLCALC, TRIG, CHOLHDL, LDLDIRECT in the last 72 hours. Thyroid  Function Tests: No results for input(s): TSH, T4TOTAL, FREET4, T3FREE, THYROIDAB in the last 72 hours. Anemia Panel: No results for input(s): VITAMINB12, FOLATE, FERRITIN, TIBC, IRON, RETICCTPCT in the last 72 hours.  Sepsis Labs: No  results for input(s): PROCALCITON, LATICACIDVEN in the last 168 hours.  Recent Results (from the past 240 hours)  MRSA Next Gen by PCR, Nasal     Status: None   Collection Time: 12/19/23 12:43 PM   Specimen: Nasal Mucosa; Nasal Swab  Result Value Ref Range Status   MRSA by PCR Next Gen NOT DETECTED NOT DETECTED Final    Comment: (NOTE) The GeneXpert MRSA Assay (FDA approved for NASAL specimens only), is one component of a comprehensive MRSA colonization surveillance program. It is not intended to diagnose MRSA infection nor to guide or monitor treatment for MRSA infections. Test performance is not FDA approved in patients less than 90 years old. Performed at [facility], [address] [place], [place] [address]   Aerobic/Anaerobic Culture w Gram Stain (surgical/deep wound)     Status: None   Collection Time: 12/23/23  8:52 AM   Specimen: Soft Tissue, Other  Result Value Ref Range Status   Specimen Description TISSUE  Final   Special Requests LEFT HIP  Final   Gram Stain   Final    RARE WBC SEEN RARE  SQUAMOUS EPITHELIAL CELLS PRESENT NO ORGANISMS SEEN    Culture   Final    RARE MORAXELLA SPECIES BETA LACTAMASE POSITIVE NO ANAEROBES ISOLATED Performed at [facility], [address] [place], [place] [address]    Report Status 12/28/2023 FINAL  Final  Aerobic/Anaerobic Culture w Gram Stain (surgical/deep wound)     Status: None   Collection Time: 12/23/23  8:53 AM   Specimen: Soft Tissue, Other  Result Value Ref Range Status   Specimen Description TISSUE  Final   Special Requests LEFT HIP  Final   Gram Stain RARE WBC SEEN NO ORGANISMS SEEN   Final   Culture   Final    No growth aerobically or anaerobically. Performed at [facility], [address], [place], [place] [address]    Report Status 12/28/2023 FINAL  Final  Aerobic/Anaerobic Culture w Gram Stain (surgical/deep wound)     Status: None   Collection Time: 12/23/23  8:53 AM   Specimen: Soft Tissue, Other  Result Value Ref Range Status   Specimen Description TISSUE  Final   Special Requests LEFT HIP  Final   Gram Stain   Final    FEW WBC PRESENT,BOTH PMN AND MONONUCLEAR NO ORGANISMS SEEN    Culture   Final    No growth aerobically or anaerobically. Performed at [facility], [address], [place], [place] [address]    Report Status 12/28/2023 FINAL  Final  Culture, blood (Routine X 2) w Reflex to ID Panel     Status: None (Preliminary result)   Collection Time: 12/27/23  6:52 PM   Specimen: BLOOD  Result Value Ref Range Status   Specimen Description BLOOD SITE NOT SPECIFIED  Final   Special Requests   Final    BOTTLES DRAWN AEROBIC AND ANAEROBIC Blood Culture results may not be optimal due to an inadequate volume of blood received in culture bottles   Culture   Final    NO GROWTH < 24 HOURS Performed at [facility], [address] [place], [place] [address]    Report Status PENDING  Incomplete  Culture, blood (Routine X 2) w Reflex to ID Panel     Status: None (Preliminary result)    Collection Time: 12/27/23  6:54 PM   Specimen: BLOOD  Result Value Ref Range Status  Specimen Description BLOOD SITE NOT SPECIFIED  Final   Special Requests   Final    BOTTLES DRAWN AEROBIC AND ANAEROBIC Blood Culture adequate volume   Culture   Final    NO GROWTH < 24 HOURS Performed at [facility], [address] [place], [place] [address]    Report Status PENDING  Incomplete         Radiology Studies: No results found.     LOS: 6 days   Time spent= 40 mins    Deliliah Room, MD [facility]  If 7PM-7AM, please contact night-coverage  12/28/2023, 10:36 AM

## 2023-12-28 NOTE — Progress Notes (Incomplete)
 OPAT - 6 weeks from 10/8  11/19

## 2023-12-28 NOTE — NC FL2 (Signed)
 [place]  MEDICAID FL2 LEVEL OF CARE FORM     IDENTIFICATION  Patient Name: Madison Martinez Birthdate: Jul 04, 1927 Sex: female Admission Date (Current Location): 12/18/2023  [facility] [place] Number:      Facility and Address:         Provider Number:    Attending Physician Name and Address:  Dino Antu, MD  Relative Name and Phone Number:       Current Level of Care:   Recommended Level of Care:   Prior Approval Number:    Date Approved/Denied:   PASRR Number: [id number]  Discharge Plan:      Current Diagnoses: Patient Active Problem List   Diagnosis Date Noted   Prosthetic joint infection of left hip 12/27/2023   Hip dislocation, left (HCC) 12/19/2023   H/O total hip arthroplasty, left 12/19/2023   History of dementia 12/19/2023   History of CVA (cerebrovascular accident) 12/19/2023   Closed dislocation of left hip (HCC) 12/19/2023   Acute cystitis 12/19/2023   Fall 12/11/2023   History of excision of lamina of lumbar vertebra for decompression of spinal cord 09/20/2019   Thickened endometrium 08/06/2019   Abnormal appearance of cervix 08/06/2019   Fluid in endometrial cavity 07/04/2019   Aortic atherosclerosis 05/23/2019   Calculus of gallbladder without cholecystitis without obstruction 05/23/2019   Sigmoid diverticulitis 05/23/2019   Ovarian mass, right 05/23/2019   Chronic anticoagulation 05/23/2019   Aneurysm of internal carotid artery 05/23/2019   Abnormal EEG 05/23/2019   Small vessel disease 05/23/2019   Advanced cerebral atrophy (HCC) 05/23/2019   Aortic valve calcification 05/23/2019   CVA (cerebral vascular accident) (HCC) 05/16/2019   Colitis 05/15/2019   Skin cancer 12/14/2018   Mild cognitive impairment 08/06/2018   Right lumbar radiculopathy 09/25/2017   At high risk for falls 09/25/2017   Use of cane as ambulatory aid 12/16/2016   CKD stage 3b, GFR 30-44 ml/min (HCC) 12/16/2016   Hyperlipidemia    Osteoporosis     Polyneuropathy    Spinal stenosis    Hard of hearing 12/02/2015   Monoclonal B-cell lymphocytosis 11/07/2013   Blind hypertensive eye 04/18/2012   Non-insulin  dependent type 2 diabetes mellitus (HCC) 03/06/2012   Essential hypertension 03/06/2012   Primary open angle glaucoma 01/06/2012   Pseudophakia 01/06/2012   Stable branch retinal vein occlusion (HCC) 01/06/2012    Orientation RESPIRATION BLADDER Height & Weight            Weight: 130 lb 8.2 oz (59.2 kg) Height:  5' 4.02 (162.6 cm)  BEHAVIORAL SYMPTOMS/MOOD NEUROLOGICAL BOWEL NUTRITION STATUS           AMBULATORY STATUS COMMUNICATION OF NEEDS Skin                               Personal Care Assistance Level of Assistance              Functional Limitations Info             SPECIAL CARE FACTORS FREQUENCY                       Contractures      Additional Factors Info                  Current Medications (12/28/2023):  This is the current hospital active medication list Current Facility-Administered Medications  Medication Dose Route Frequency Provider Last Rate Last Admin   acetaminophen  (  TYLENOL ) tablet 1,000 mg  1,000 mg Oral Q8H Griffith, Kelly A, DO   1,000 mg at 12/28/23 9355   Or   acetaminophen  (TYLENOL ) suppository 650 mg  650 mg Rectal Q8H Fausto Sor A, DO       aspirin  EC tablet 81 mg  81 mg Oral BID Cockerham, Alicia M, PA-C   81 mg at 12/28/23 9166   cefTRIAXone  (ROCEPHIN ) 2 g in sodium chloride  0.9 % 100 mL IVPB  2 g Intravenous Q24H Shetty, Dithi A, MD 200 mL/hr at 12/27/23 1757 2 g at 12/27/23 1757   clopidogrel  (PLAVIX ) tablet 75 mg  75 mg Oral Daily Cockerham, Alicia M, PA-C   75 mg at 12/28/23 0833   HYDROmorphone (DILAUDID) injection 0.5 mg  0.5 mg Intravenous Q4H PRN Cockerham, Alicia M, PA-C       methocarbamol (ROBAXIN) injection 500 mg  500 mg Intravenous Q6H PRN Cockerham, Alicia M, PA-C       ondansetron  (ZOFRAN ) tablet 4 mg  4 mg Oral Q6H PRN Cockerham,  Alicia M, PA-C       Or   ondansetron  (ZOFRAN ) injection 4 mg  4 mg Intravenous Q6H PRN Cockerham, Alicia M, PA-C       oxyCODONE (Oxy IR/ROXICODONE) immediate release tablet 5 mg  5 mg Oral Q6H PRN Fausto Sor A, DO   5 mg at 12/27/23 2014   pravastatin  (PRAVACHOL ) tablet 40 mg  40 mg Oral q1800 Cockerham, Alicia M, PA-C   40 mg at 12/27/23 1754   QUEtiapine  (SEROQUEL ) tablet 50 mg  50 mg Oral QHS Cockerham, Alicia M, PA-C   50 mg at 12/27/23 2202   sodium chloride  0.9 % bolus 250 mL  250 mL Intravenous Once Chavez, Abigail, NP   Stopped at 12/24/23 9147   sodium chloride  flush (NS) 0.9 % injection 3 mL  3 mL Intravenous Q12H Cockerham, Alicia M, PA-C   3 mL at 12/27/23 2200   sodium chloride  flush (NS) 0.9 % injection 3 mL  3 mL Intravenous Q12H Cockerham, Alicia M, PA-C   3 mL at 12/28/23 9164   sodium chloride  flush (NS) 0.9 % injection 3 mL  3 mL Intravenous PRN Cockerham, Alicia M, PA-C       traMADol  (ULTRAM ) tablet 50 mg  50 mg Oral Q6H PRN Fausto Sor A, DO   50 mg at 12/28/23 0254     Discharge Medications: Please see discharge summary for a list of discharge medications.  Relevant Imaging Results:  Relevant Lab Results:   Additional Information SSN: [id number], IV abx: rocephin  2gm qd.  Bridget Cordella Simmonds, LCSW

## 2023-12-28 NOTE — Progress Notes (Signed)
    [facility]  Date of Admission:  12/18/2023     Reason for Follow Up: Hip dislocation, left (HCC)         BRIEF PROGRESS NOTE:  Diagnosis:  Left hip septic arthritis and osteomyelitis  Culture Result: Moraxella   Allergies  Allergen Reactions   Bayer Aspirin  [Aspirin ] Other (See Comments)    Per patient - told avoid while taking Plavix .   Influenza Virus Vaccine Swelling    Localized swelling at injection site   Motrin [Ibuprofen] Other (See Comments)    Per patient - told avoid while taking Plavix  by cardiology   Norvasc [Amlodipine Besylate] Other (See Comments)    Vertigo    OPAT Orders Discharge antibiotics to be given via PICC line Discharge antibiotics: Ceftriaxone  2 g IV q 24 Per pharmacy protocol   Duration: 6 weeks  End Date: 02/07/24  [facility] Care Per Protocol:  Home health RN for IV administration and teaching; PICC line care and labs.    Labs weekly while on IV antibiotics: _X_ CBC with differential __ BMP _X_ CMP _X_ CRP _X_ ESR __ Vancomycin trough __ CK  _X_ Please pull PIC at completion of IV antibiotics __ Please leave PIC in place until doctor has seen patient or been notified  Fax weekly labs to [phone]  Clinic Follow Up Appt:  01/17/24 at 10:30 am with Dr. Luiz Cathlyn July, NP [facility]  12/28/2023  5:14 PM

## 2023-12-29 ENCOUNTER — Other Ambulatory Visit: Payer: Self-pay

## 2023-12-29 DIAGNOSIS — S73005A Unspecified dislocation of left hip, initial encounter: Secondary | ICD-10-CM | POA: Diagnosis not present

## 2023-12-29 DIAGNOSIS — M009 Pyogenic arthritis, unspecified: Secondary | ICD-10-CM

## 2023-12-29 MED ORDER — ASPIRIN 81 MG PO TBEC: 81.0000 mg | DELAYED_RELEASE_TABLET | Freq: Two times a day (BID) | ORAL | Status: AC

## 2023-12-29 MED ORDER — SODIUM CHLORIDE 0.9 % IV SOLN: 2.0000 g | INTRAVENOUS | Status: AC

## 2023-12-29 MED ORDER — CHLORHEXIDINE GLUCONATE CLOTH 2 % EX PADS
6.0000 | MEDICATED_PAD | Freq: Every day | CUTANEOUS | Status: DC
  Administered 2023-12-29: 6 via TOPICAL

## 2023-12-29 MED ORDER — QUETIAPINE FUMARATE 50 MG PO TABS: 50.0000 mg | ORAL_TABLET | Freq: Every day | ORAL | Status: AC

## 2023-12-29 MED ORDER — SODIUM CHLORIDE 0.9% FLUSH: 10.0000 mL | INTRAVENOUS | Status: DC | PRN

## 2023-12-29 MED ORDER — POLYETHYLENE GLYCOL 3350 17 G PO PACK
17.0000 g | PACK | Freq: Every day | ORAL | Status: DC
  Administered 2023-12-29: 17 g via ORAL
  Filled 2023-12-29: qty 1

## 2023-12-29 NOTE — Progress Notes (Signed)
 Physical Therapy Treatment Patient Details Name: Madison Martinez MRN: [id number] DOB: 15-Jun-1927 Today's Date: 12/29/2023   History of Present Illness Madison Martinez is a 89 y.o. female admitted 12/18/23 for left hip dislocation. X-rays pelvis/hip demonstrate a concentrically reduced left hip arthroplasty and notably the acetabular component is lateralized and vertical, which may predispose her to continued instability and dislocation. Plan for conservative measures hopeful to avoid additional surgery. Pt had repeat dislocation during hospitalization resulting OR for L THA revision 10/4. PMHx: T2DM, HTN, HLD, L eye blindness, CVA with residual gait abnormality, osteoporosis, polyneuropathy, spinal stenosis, and dementia. Of note, recent hospitalization 9/22-9/25 for L hip dislocation after fall at home. Pt was reduced in ED and Ortho recommend non-operative mgmt, knee immobilizer, WBAT and outpatient follow up.    PT Comments  Pt sleeping upon arrival however easily awoken. Pt c/o being cold and stating this leg hurts so bad rubbing her L hand over her L hip. Pt remains to only be oriented to self with no recall of being in the hospital and having L hip surgery. Pt did tolerate AAROM to bilat LEs in supine well and was able to sit EOB x with close contact guard. Pt unable to stand despite maxAx2 and multiple attempts. Pt remains appropriate to return to SNF and will most likely need to by lifted to w/c due to inability to stand or adhere to L LE PWB precautions. Acute PT to cont to follow.    If plan is discharge home, recommend the following: Two people to help with walking and/or transfers;Two people to help with bathing/dressing/bathroom;Assistance with cooking/housework;Assist for transportation;Help with stairs or ramp for entrance   Can travel by private vehicle     No  Equipment Recommendations  Hospital bed;Hoyer lift;Wheelchair (measurements PT);Wheelchair cushion (measurements PT)     Recommendations for Other Services       Precautions / Restrictions Precautions Precautions: Fall;Posterior Hip Precaution Booklet Issued: Yes (comment) Recall of Precautions/Restrictions: Impaired Precaution/Restrictions Comments: educated pt in posterior hip precautions Required Braces or Orthoses: Other Brace Knee Immobilizer - Left: On at all times Spinal Brace: Lumbar corset (LSO with hip abduction brace in room however no orders for it or mentioned in PT/OT notes or Ortho Surgeon notes. Pt with hip abductor pillow in bed) Other Brace: LE abduction pillow in bed Restrictions Weight Bearing Restrictions Per Provider Order: Yes LLE Weight Bearing Per Provider Order: Partial weight bearing LLE Partial Weight Bearing Percentage or Pounds: 20     Mobility  Bed Mobility Overal bed mobility: Needs Assistance Bed Mobility: Supine to Sit, Sit to Sidelying     Supine to sit: +2 for physical assistance, Total assist, HOB elevated Sit to supine: Total assist, +2 for physical assistance   General bed mobility comments: assist for all aspects of bed level mobility with hip abduction pillow donned, HOB elevated, helicopter transfer technique utilized    Transfers Overall transfer level: Needs assistance Equipment used: 2 person hand held assist Transfers: Sit to/from Stand Sit to Stand: +2 physical assistance, Total assist           General transfer comment: attempted to stand x2 however unsuccessfull, patient with grimacing and resistance most likely due to fear and pain. Pt not wanting to put L LE down, naturally due to pain, however unable to comprehend WBing through R LE and using UEs to stand due to dementia    Ambulation/Gait  General Gait Details: Unable   Stairs             Wheelchair Mobility     Tilt Bed    Modified Rankin (Stroke Patients Only)       Balance Overall balance assessment: Needs assistance, History of  Falls Sitting-balance support: Bilateral upper extremity supported, Feet supported Sitting balance-Leahy Scale: Fair Sitting balance - Comments: CGA EOB Postural control: Posterior lean Standing balance support: Bilateral upper extremity supported, During functional activity, Reliant on assistive device for balance Standing balance-Leahy Scale: Zero Standing balance comment: Pt dependent on maxA x2 and use of stedy                            Communication Communication Communication: Impaired Factors Affecting Communication: Hearing impaired  Cognition Arousal: Alert Behavior During Therapy: WFL for tasks assessed/performed   PT - Cognitive impairments: History of cognitive impairments   Orientation impairments: Place, Time, Situation                   PT - Cognition Comments: dementia at baseline, reports she is 89 yo, doesn't know she is in the hospital or that she's had hip surgery. reports she lives with her son Following commands: Impaired Following commands impaired: Follows one step commands inconsistently    Cueing Cueing Techniques: Verbal cues, Gestural cues, Tactile cues  Exercises General Exercises - Lower Extremity Ankle Circles/Pumps: Both, Supine, AAROM, AROM, 10 reps (pt maintains BLE plantarflexion and internal rotation at ankles at baseline) Quad Sets: Other (comment), Limitations (unable to grasp concept of quad set) Long Arc Quad: AROM, Both, 10 reps, Seated (with max directional cues to stay on task) Hip ABduction/ADduction: AAROM, PROM, Left, Right, Supine Hip Flexion/Marching: AROM, 10 reps, AAROM, Left, Right, Supine (L hip flex to 90 deg to adhere to post hip prec, pt tolerated well)    General Comments General comments (skin integrity, edema, etc.): VSS, pt with redness on lower spine, RN notified      Pertinent Vitals/Pain Pain Assessment Pain Assessment: Faces Faces Pain Scale: Hurts even more Pain Location: LLE with  movement Pain Descriptors / Indicators: Grimacing, Guarding, Discomfort    Home Living                          Prior Function            PT Goals (current goals can now be found in the care plan section) Acute Rehab PT Goals Patient Stated Goal: not stated PT Goal Formulation: Patient unable to participate in goal setting Time For Goal Achievement: 01/07/24 Potential to Achieve Goals: Poor Progress towards PT goals: Not progressing toward goals - comment    Frequency    Min 2X/week      PT Plan      Co-evaluation              AM-PAC PT 6 Clicks Mobility   Outcome Measure  Help needed turning from your back to your side while in a flat bed without using bedrails?: Total Help needed moving from lying on your back to sitting on the side of a flat bed without using bedrails?: Total Help needed moving to and from a bed to a chair (including a wheelchair)?: Total Help needed standing up from a chair using your arms (e.g., wheelchair or bedside chair)?: Total Help needed to walk in hospital room?: Total Help needed climbing 3-5 steps  with a railing? : Total 6 Click Score: 6    End of Session Equipment Utilized During Treatment: Gait belt Activity Tolerance: Patient tolerated treatment well Patient left: in bed;with call bell/phone within reach;with bed alarm set;with SCD's reapplied (hip abduction wedge in place) Nurse Communication: Mobility status PT Visit Diagnosis: Unsteadiness on feet (R26.81);History of falling (Z91.81);Muscle weakness (generalized) (M62.81);Difficulty in walking, not elsewhere classified (R26.2);Pain Pain - Right/Left: Left Pain - part of body: Hip     Time: 0757-0829 PT Time Calculation (min) (ACUTE ONLY): 32 min  Charges:    $Therapeutic Exercise: 8-22 mins $Therapeutic Activity: 8-22 mins PT General Charges $$ ACUTE PT VISIT: 1 Visit                     Norene Ames, PT, DPT Acute Rehabilitation Services Secure  chat preferred Office #: [phone]    Norene CHRISTELLA Ames 12/29/2023, 8:41 AM

## 2023-12-29 NOTE — TOC Progression Note (Addendum)
 Transition of Care [facility]) - Progression Note    Patient Details  Name: Madison Martinez MRN: [id number] Date of Birth: [birth year]  Transition of Care [facility] CM/SW Contact  Bridget Cordella Simmonds, LCSW Phone Number: 12/29/2023, 10:07 AM  Clinical Narrative:   New PT note uploaded to [facility].  Auth remains pending.  1300: Auth approved: [id number], 5 days: 10/10-10/14.   MD informed.  Pt will DC on preveena wound vac.    CSW confirmed with [facility] that they can receive pt today.   1500: PICC line not in place.  RN working on this  Expected Discharge Plan: Skilled Nursing Facility Barriers to Discharge: Continued Medical Work up               Expected Discharge Plan and Services In-house Referral: Clinical Social Work   Post Acute Care Choice: Skilled Nursing Facility Living arrangements for the past 2 months: Single Family Home                                       Social Drivers of Health (SDOH) Interventions SDOH Screenings   Food Insecurity: Patient Unable To Answer (12/19/2023)  Housing: Unknown (12/19/2023)  Transportation Needs: No Transportation Needs (12/19/2023)  Utilities: Not At Risk (12/19/2023)  Alcohol Screen: Low Risk  (08/12/2020)  Depression (PHQ2-9): Medium Risk (08/11/2023)  Financial Resource Strain: Low Risk  (08/31/2022)  Physical Activity: Insufficiently Active (08/31/2022)  Social Connections: Socially Isolated (12/19/2023)  Stress: No Stress Concern Present (08/31/2022)  Tobacco Use: Low Risk  (12/23/2023)    Readmission Risk Interventions     No data to display

## 2023-12-29 NOTE — Plan of Care (Signed)

## 2023-12-29 NOTE — TOC Transition Note (Signed)
 Transition of Care [facility]) - Discharge Note   Patient Details  Name: Madison Martinez MRN: [id number] Date of Birth: February 10, 1928  Transition of Care [facility] CM/SW Contact:  Bridget Cordella Simmonds, LCSW Phone Number: 12/29/2023, 3:41 PM   Clinical Narrative:   Pt discharging to [facility].  RN call report to [phone].  Pt placed on will call status with [facility].  Please call them at [phone] when pt is ready to be picked up.     Final next level of care: Skilled Nursing Facility Barriers to Discharge: Barriers Resolved   Patient Goals and CMS Choice     Choice offered to / list presented to : Adult Children (son Max)      Discharge Placement              Patient chooses bed at: [facility] Patient to be transferred to facility by: [facility] Name of family member notified: son Max Patient and family notified of of transfer: 12/29/23  Discharge Plan and Services Additional resources added to the After Visit Summary for   In-house Referral: Clinical Social Work   Post Acute Care Choice: Skilled Nursing Facility                               Social Drivers of Health (SDOH) Interventions SDOH Screenings   Food Insecurity: Patient Unable To Answer (12/19/2023)  Housing: Unknown (12/19/2023)  Transportation Needs: No Transportation Needs (12/19/2023)  Utilities: Not At Risk (12/19/2023)  Alcohol Screen: Low Risk  (08/12/2020)  Depression (PHQ2-9): Medium Risk (08/11/2023)  Financial Resource Strain: Low Risk  (08/31/2022)  Physical Activity: Insufficiently Active (08/31/2022)  Social Connections: Socially Isolated (12/19/2023)  Stress: No Stress Concern Present (08/31/2022)  Tobacco Use: Low Risk  (12/23/2023)     Readmission Risk Interventions     No data to display

## 2023-12-29 NOTE — Progress Notes (Signed)
 PROGRESS NOTE    Madison Martinez  FMW:[id number] DOB: 10-08-1927 DOA: 12/18/2023 PCP: Catherine Charlies LABOR, DO   Brief Narrative:    89 y.o. female  history of dementia, DM type II, hypertension, hyperlipidemia, CKD 3B, blindness of the left eye, CVA, and recent hip dislocation due to mechanical fall which was reduced in the ED and discharged from the hospital 9/25.  She presented to ED on 9/29 via EMS after a fall with recurrent left hip pain, x-ray at the rehab facility showed recurrent left hip dislocation.  Patient was seen by orthopedics in the ED and the hip was reduced.   -status post left hip arthoplasty revision on 10/4 with Dr. Edna  - 10/8: ID consulted for management of Moraxella infection of the hip and she is on IV Rocephin  now.   Assessment & Plan:  Principal Problem:   Hip dislocation, left (HCC) Active Problems:   Non-insulin  dependent type 2 diabetes mellitus (HCC)   Essential hypertension   Hyperlipidemia   CKD stage 3b, GFR 30-44 ml/min (HCC)   H/O total hip arthroplasty, left   History of dementia   History of CVA (cerebrovascular accident)   Closed dislocation of left hip (HCC)   Acute cystitis   Prosthetic joint infection of left hip   Left prosthetic hip dislocation - recurrent History of left hip arthroplasty History of recurrent left prosthetic hip dislocation Dislocated hip was reduced in ED by Dr. Ginger.   Repeat x-ray showing reduction of the left hip arthroplasty.  No acute evidence of fracture. --Ortho following, appreciate recommendations: --status post left hip arthoplasty revision on 10/4 with Dr. Edna --Follow post-op recommendations from Ortho --Resumed plavix  on 10/6 --Pain control per orders - changed to scheduled Tylenol  and PRN tramadol  or oxy --PT/OT  --DC back to SNF on 10/10 -Intraoperative left hip culture grew Rare Moraxella species, beta lactamase positive. Consulted ID on 10/8.  Left hip septic arthritis and  osteomyelitis,POA: -Hip culture grew Rare Moraxella species, beta lactamase positive -Started on IV ceftriaxone  2g Q24H on 10/8. Will need long term antibiotics unitl 02/07/24.  -Repeat blood cultures ordered on 10/8-NGTD -Appointment 01/17/24 at 10:30 am with Dr. Luiz BATTER Iron deficiency --Post op Hbg 10 >> 8.7 >> 8.2 --Iron studies reflect deficiency --IV iron infusion received on 10/5 --Trend Hbg, transfuse if < 7 or actively bleeding.   History of CVA - Resumed plavix  on 10/6   Non-insulin -dependent DM type II -Previously patient was diabetic.  A1c 6.2.  Continue heart healthy carb modified diet   Essential hypertension - BP's controlled, but labile soft at times after surgery 10/4 --Holding lisinopril  and Toprol -XL, resume when BP will tolerate   Hyperlipidemia -Continue Pravachol    History of dementia Generalized anxiety disorder - Continue Seroquel  at bedtime   Acute cystitis - s/p amoxicillin    Hyperlipidemia History of CVA   CKD stage 3B Renal function at baseline. --Monitor  Disposition: Pending SNF   DVT prophylaxis: SCDs Start: 12/19/23 0150 Place TED hose Start: 12/19/23 0150     Code Status: Full Code Family Communication:  None at the bedside Status is: Inpatient Remains inpatient appropriate because:Pending SNF  Subjective:  No acute events overnight. Discussed case with ID Dr Zoanne.   Examination:  General exam: Appears slightly drowsy, hard of hearing  Respiratory system: Clear to auscultation. Respiratory effort normal. Cardiovascular system: S1 & S2 heard, RRR. No JVD, murmurs, rubs, gallops or clicks. No pedal edema. Gastrointestinal system: Abdomen is nondistended, soft  and nontender. No organomegaly or masses felt. Normal bowel sounds heard. Central nervous system: Slightly drowsy and confused. No focal neurological deficits. Extremities: Left hip dressing and wound vac in place     Diet Orders (From admission, onward)      Start     Ordered   12/23/23 1146  Diet regular Room service appropriate? Yes; Fluid consistency: Thin  Diet effective now       Question Answer Comment  Room service appropriate? Yes   Fluid consistency: Thin      12/23/23 1145            Objective: Vitals:   12/28/23 1326 12/29/23 0011 12/29/23 0659 12/29/23 0830  BP: (!) 121/54 118/68  120/64  Pulse: 84 83  74  Resp: 18   18  Temp: 98.1 F (36.7 C) 97.9 F (36.6 C)  97.7 F (36.5 C)  TempSrc:  Oral  Oral  SpO2: 100% 100%  100%  Weight:   65.5 kg   Height:        Intake/Output Summary (Last 24 hours) at 12/29/2023 0958 Last data filed at 12/28/2023 1847 Gross per 24 hour  Intake 480 ml  Output 400 ml  Net 80 ml   Filed Weights   12/19/23 1243 12/23/23 0746 12/29/23 0659  Weight: 59.2 kg 59.2 kg 65.5 kg    Scheduled Meds:  acetaminophen   1,000 mg Oral Q8H   Or   acetaminophen   650 mg Rectal Q8H   aspirin  EC  81 mg Oral BID   clopidogrel   75 mg Oral Daily   polyethylene glycol  17 g Oral Daily   pravastatin   40 mg Oral q1800   QUEtiapine   50 mg Oral QHS   sodium chloride  flush  3 mL Intravenous Q12H   sodium chloride  flush  3 mL Intravenous Q12H   Continuous Infusions:  cefTRIAXone  (ROCEPHIN )  IV Stopped (12/28/23 1905)   sodium chloride  Stopped (12/24/23 9147)    Nutritional status     Body mass index is 24.77 kg/m.  Data Reviewed:   CBC: Recent Labs  Lab 12/23/23 1326 12/24/23 0428 12/25/23 0015  WBC 13.4* 15.0* 13.4*  HGB 10.0* 8.7* 8.2*  HCT 31.3* 26.3* 25.6*  MCV 96.6 94.3 96.6  PLT 224 221 209   Basic Metabolic Panel: Recent Labs  Lab 12/23/23 1326 12/25/23 0015  NA 135 132*  K 4.9 4.3  CL 103 103  CO2 22 21*  GLUCOSE 188* 168*  BUN 28* 29*  CREATININE 1.20* 1.22*  CALCIUM 8.9 8.3*   GFR: Estimated Creatinine Clearance: 24.3 mL/min (A) (by C-G formula based on SCr of 1.22 mg/dL (H)). Liver Function Tests: No results for input(s): AST, ALT, ALKPHOS,  BILITOT, PROT, ALBUMIN in the last 168 hours.  No results for input(s): LIPASE, AMYLASE in the last 168 hours. No results for input(s): AMMONIA in the last 168 hours. Coagulation Profile: No results for input(s): INR, PROTIME in the last 168 hours. Cardiac Enzymes: No results for input(s): CKTOTAL, CKMB, CKMBINDEX, TROPONINI in the last 168 hours. BNP (last 3 results) No results for input(s): PROBNP in the last 8760 hours. HbA1C: No results for input(s): HGBA1C in the last 72 hours. CBG: Recent Labs  Lab 12/23/23 0729 12/23/23 1130  GLUCAP 117* 169*   Lipid Profile: No results for input(s): CHOL, HDL, LDLCALC, TRIG, CHOLHDL, LDLDIRECT in the last 72 hours. Thyroid  Function Tests: No results for input(s): TSH, T4TOTAL, FREET4, T3FREE, THYROIDAB in the last 72 hours. Anemia Panel: No  results for input(s): VITAMINB12, FOLATE, FERRITIN, TIBC, IRON, RETICCTPCT in the last 72 hours.  Sepsis Labs: No results for input(s): PROCALCITON, LATICACIDVEN in the last 168 hours.  Recent Results (from the past 240 hours)  MRSA Next Gen by PCR, Nasal     Status: None   Collection Time: 12/19/23 12:43 PM   Specimen: Nasal Mucosa; Nasal Swab  Result Value Ref Range Status   MRSA by PCR Next Gen NOT DETECTED NOT DETECTED Final    Comment: (NOTE) The GeneXpert MRSA Assay (FDA approved for NASAL specimens only), is one component of a comprehensive MRSA colonization surveillance program. It is not intended to diagnose MRSA infection nor to guide or monitor treatment for MRSA infections. Test performance is not FDA approved in patients less than 16 years old. Performed at [facility], [address] [place], [place] [address]   Aerobic/Anaerobic Culture w Gram Stain (surgical/deep wound)     Status: None   Collection Time: 12/23/23  8:52 AM   Specimen: Soft Tissue, Other  Result Value Ref Range Status   Specimen  Description TISSUE  Final   Special Requests LEFT HIP  Final   Gram Stain   Final    RARE WBC SEEN RARE SQUAMOUS EPITHELIAL CELLS PRESENT NO ORGANISMS SEEN    Culture   Final    RARE MORAXELLA SPECIES BETA LACTAMASE POSITIVE NO ANAEROBES ISOLATED Performed at [facility], [address], [place], [place] [address]    Report Status 12/28/2023 FINAL  Final  Aerobic/Anaerobic Culture w Gram Stain (surgical/deep wound)     Status: None   Collection Time: 12/23/23  8:53 AM   Specimen: Soft Tissue, Other  Result Value Ref Range Status   Specimen Description TISSUE  Final   Special Requests LEFT HIP  Final   Gram Stain RARE WBC SEEN NO ORGANISMS SEEN   Final   Culture   Final    No growth aerobically or anaerobically. Performed at [facility], [address] [place], [place] [address]    Report Status 12/28/2023 FINAL  Final  Aerobic/Anaerobic Culture w Gram Stain (surgical/deep wound)     Status: None   Collection Time: 12/23/23  8:53 AM   Specimen: Soft Tissue, Other  Result Value Ref Range Status   Specimen Description TISSUE  Final   Special Requests LEFT HIP  Final   Gram Stain   Final    FEW WBC PRESENT,BOTH PMN AND MONONUCLEAR NO ORGANISMS SEEN    Culture   Final    No growth aerobically or anaerobically. Performed at [facility], [address] [place], [place] [address]    Report Status 12/28/2023 FINAL  Final  Culture, blood (Routine X 2) w Reflex to ID Panel     Status: None (Preliminary result)   Collection Time: 12/27/23  6:52 PM   Specimen: BLOOD  Result Value Ref Range Status   Specimen Description BLOOD SITE NOT SPECIFIED  Final   Special Requests   Final    BOTTLES DRAWN AEROBIC AND ANAEROBIC Blood Culture results may not be optimal due to an inadequate volume of blood received in culture bottles   Culture   Final    NO GROWTH 2 DAYS Performed at [facility], [address] [place], [place] [address]    Report Status  PENDING  Incomplete  Culture, blood (Routine X 2) w Reflex to ID Panel     Status: None (Preliminary result)  Collection Time: 12/27/23  6:54 PM   Specimen: BLOOD  Result Value Ref Range Status   Specimen Description BLOOD SITE NOT SPECIFIED  Final   Special Requests   Final    BOTTLES DRAWN AEROBIC AND ANAEROBIC Blood Culture adequate volume   Culture   Final    NO GROWTH 2 DAYS Performed at [facility], [address] [place], [place] [address]    Report Status PENDING  Incomplete         Radiology Studies: No results found.     LOS: 7 days   Time spent= 40 mins    Deliliah Room, MD [facility]  If 7PM-7AM, please contact night-coverage  12/29/2023, 9:58 AM

## 2023-12-29 NOTE — Discharge Summary (Signed)
 Physician Discharge Summary   Patient: Madison Martinez MRN: [id number] DOB: June 05, 1927  Admit date:     12/18/2023  Discharge date: 12/29/23  Discharge Physician: Deliliah Room   PCP: Catherine Fuller A, DO   Recommendations at discharge:    F/u with your PCP in one week F/u with orthopedics in 1-2 weeks. Call to make an appointment. F/u with ID, Dr Luiz on 10/29 at 10:30 am.  Continue  taking meds as prescribed  Discharge Diagnoses: Principal Problem:   Hip dislocation, left (HCC) Active Problems:   Non-insulin  dependent type 2 diabetes mellitus (HCC)   Essential hypertension   Hyperlipidemia   CKD stage 3b, GFR 30-44 ml/min (HCC)   H/O total hip arthroplasty, left   History of dementia   History of CVA (cerebrovascular accident)   Closed dislocation of left hip (HCC)   Acute cystitis   Prosthetic joint infection of left hip   Hospital Course:  89 y.o. female  history of dementia, DM type II, hypertension, hyperlipidemia, CKD 3B, blindness of the left eye, CVA, and recent hip dislocation due to mechanical fall which was reduced in the ED and discharged from the hospital 9/25.  She presented to ED on 9/29 via EMS after a fall with recurrent left hip pain, x-ray at the rehab facility showed recurrent left hip dislocation.   Patient was seen by orthopedics in the ED and the hip was reduced.   -status post left hip arthoplasty revision on 10/4 with Dr. 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 She is supposed to be on it until 11/29. Follow up with PCP in one week, orthopedics in 1-2 weeks and ID, Dr Luiz on 01/17/24 at 10:30 am. She has a wound vac (Prevena) in place and that is supposed to stay for a week. BP meds help and can be restarted if BP is high. PCP to follow up on that. Discharged to [facility].      Consultants: ID,  Orthopedics Procedures performed: left hip arthoplasty revision on 10/4 with Dr. Edna   Disposition: Skilled nursing facility Diet recommendation:  Regular diet DISCHARGE MEDICATION: Allergies as of 12/29/2023       Reactions   Bayer Aspirin  [aspirin ] Other (See Comments)   Per patient - told avoid while taking Plavix .   Influenza Virus Vaccine Swelling   Localized swelling at injection site   Motrin [ibuprofen] Other (See Comments)   Per patient - told avoid while taking Plavix  by cardiology   Norvasc [amlodipine Besylate] Other (See Comments)   Vertigo        Medication List     STOP taking these medications    amoxicillin  500 MG capsule Commonly known as: AMOXIL    hydrALAZINE  25 MG tablet Commonly known as: APRESOLINE    lisinopril  40 MG tablet Commonly known as: ZESTRIL    metoprolol  succinate 25 MG 24 hr tablet Commonly known as: TOPROL -XL       TAKE these medications    aspirin  EC 81 MG tablet Take 1 tablet (81 mg total) by mouth 2 (two) times daily for 28 days. Swallow whole.   cefTRIAXone  2 g in sodium chloride  0.9 % 100 mL Inject 2 g into the vein daily.   clopidogrel  75 MG tablet Commonly known as: PLAVIX  Take 1 tablet (75 mg total) by mouth daily.   dorzolamide -timolol  2-0.5 % ophthalmic solution Commonly known  as: COSOPT  Place 1 drop into both eyes 2 (two) times daily. What changed: how to take this   FISH OIL PO Take 1 capsule by mouth daily.   Multivitamin Women 50+ Tabs Take 1 tablet by mouth daily.   pravastatin  40 MG tablet Commonly known as: PRAVACHOL  Take 1 tablet (40 mg total) by mouth daily.   QUEtiapine  50 MG tablet Commonly known as: SEROquel  Take 1 tablet (50 mg total) by mouth at bedtime. What changed:  medication strength how much to take   VITAMIN D -3 PO Take 1 capsule by mouth daily.   Vyzulta  0.024 % Soln Generic drug: Latanoprostene Bunod  Apply 1 drop to eye at bedtime. What changed: how to take this         Follow-up Information     Kuneff, Renee A, DO. Schedule an appointment as soon as possible for a visit in 1 week(s).   Specialty: Family Medicine Contact information: [address] [place] [address] [phone]         Luiz Channel, MD. Go on 01/17/2024.   Specialty: Infectious Diseases Why: at 10:30 am Contact information: [address] [place] [address] [phone]         Edna Toribio LABOR, MD. Schedule an appointment as soon as possible for a visit in 1 week(s).   Specialty: Orthopedic Surgery Contact information: [address] [place] [address] [phone]                Discharge Exam: Fredricka Weights   12/19/23 1243 12/23/23 0746 12/29/23 0659  Weight: 59.2 kg 59.2 kg 65.5 kg   General exam: Appears slightly drowsy, hard of hearing  Respiratory system: Clear to auscultation. Respiratory effort normal. Cardiovascular system: S1 & S2 heard, RRR. No JVD, murmurs, rubs, gallops or clicks. No pedal edema. Gastrointestinal system: Abdomen is nondistended, soft and nontender. No organomegaly or masses felt. Normal bowel sounds heard. Central nervous system: Slightly drowsy and confused. No focal neurological deficits. Extremities: Left hip dressing and wound vac in place    Condition at discharge: good  The results of significant diagnostics from this hospitalization (including imaging, microbiology, ancillary and laboratory) are listed below for reference.   Imaging Studies: DG HIP UNILAT W OR W/O PELVIS 2-3 VIEWS LEFT Result Date: 12/23/2023 CLINICAL DATA:  Postop. EXAM: DG HIP (WITH OR WITHOUT PELVIS) 2-3V LEFT COMPARISON:  Preoperative imaging FINDINGS: Previous left hip arthroplasty dislocation has been reduced. The femoral head component is now seated in the acetabular component. No evidence of acute fracture. Previous right hip arthroplasty. Question wound VAC projecting over the left hip.  IMPRESSION: Previous left hip arthroplasty dislocation has been reduced. Electronically Signed   By: Andrea Gasman M.D.   On: 12/23/2023 14:33   DG HIP PORT UNILAT WITH PELVIS 1V LEFT Result Date: 12/23/2023 CLINICAL DATA:  Total hip arthroplasty. EXAM: DG HIP (WITH OR WITHOUT PELVIS) 1V PORT LEFT COMPARISON:  CT scan 12/22/2023 FINDINGS: 3 intraoperative spot fluoro films obtained during revision of left total hip arthroplasty. IMPRESSION: Intraoperative assessment. Electronically Signed   By: Camellia Candle M.D.   On: 12/23/2023 12:21   DG C-Arm 1-60 Min-No Report Result Date: 12/23/2023 Fluoroscopy was utilized by the requesting physician.  No radiographic interpretation.   DG C-Arm 1-60 Min-No Report Result Date: 12/23/2023 Fluoroscopy was utilized by the requesting physician.  No radiographic interpretation.   CT HIP LEFT WO CONTRAST Result Date: 12/22/2023 CLINICAL DATA:  Dislocation of left hip  arthroplasty. EXAM: CT OF THE LEFT HIP WITHOUT CONTRAST TECHNIQUE: Multidetector CT imaging of the left hip was performed according to the standard protocol. Multiplanar CT image reconstructions were also generated. RADIATION DOSE REDUCTION: This exam was performed according to the departmental dose-optimization program which includes automated exposure control, adjustment of the mA and/or kV according to patient size and/or use of iterative reconstruction technique. COMPARISON:  Left hip radiographs dated 12/21/2023. FINDINGS: Bones/Joint/Cartilage Redemonstrated posterior and superior dislocation of the left femoral prosthesis relative to the acetabular cup. No acute fracture. Diffuse osseous demineralization with suspected increased periprosthetic lucency along the superomedial and inferomedial margins of the acetabular cup, which can be associated with loosening. The acetabular screw extends through the posterior acetabular wall and is otherwise intact. No discrete periarticular collection. The left  sacroiliac joint and pubic symphysis are anatomically aligned with degenerative changes. Ligaments Ligaments are suboptimally evaluated by CT. Muscles and Tendons Evaluation is limited due to streak artifact. No appreciable gross acute abnormality. Soft tissue Soft tissue swelling along the left lateral hip. No discrete fluid collection. Other: Sigmoid colonic diverticulosis. IMPRESSION: Redemonstrated posterior and superior dislocation of the left femoral prosthesis relative to the acetabular cup. No acute fracture. Diffuse osseous demineralization with suspected increased periprosthetic lucency along the superomedial and inferomedial margins of the acetabular cup, which can be associated with loosening. The acetabular screw extends through the posterior acetabular wall and is otherwise intact. No discrete periarticular collection. Electronically Signed   By: Harrietta Sherry M.D.   On: 12/22/2023 08:39   DG HIP PORT UNILAT WITH PELVIS 1V LEFT Result Date: 12/21/2023 CLINICAL DATA:  Dislocation of the left hip. EXAM: DG HIP (WITH OR WITHOUT PELVIS) 1V PORT LEFT COMPARISON:  Multiple films on 12/18/2023 FINDINGS: There is recurrent dislocation of the left hip arthroplasty. Alignment appears similar to the prior dislocation film. No fractures are visualized. A right hip arthroplasty is incompletely evaluated, but grossly appears in normal alignment. IMPRESSION: Recurrent dislocation of the left hip arthroplasty. Alignment is similar to 12/18/2023 at 8:58 p.m. Electronically Signed   By: Reyes Honer M.D.   On: 12/21/2023 17:05   DG Pelvis Portable Result Date: 12/18/2023 EXAM: 1 or 2 VIEW(S) XRAY OF THE PELVIS 12/18/2023 11:07:34 PM COMPARISON: Radiograph earlier today. CLINICAL HISTORY: Post reduction FINDINGS: BONES AND JOINTS: Reduction of left hip arthroplasty dislocation. The acetabular cup is unchanged in alignment directed laterally. Right hip arthroplasty is intact where visualized. Cerclage wires  around the visualized portion of the right femur. No acute fracture. No focal osseous lesion. SOFT TISSUES: The soft tissues are unremarkable. IMPRESSION: 1. Reduction of left hip arthroplasty dislocation. 2. No acute fracture. Electronically signed by: Andrea Gasman MD 12/18/2023 11:20 PM EDT RP Workstation: [id number]   DG Hip Unilat W or Wo Pelvis 2-3 Views Left Result Date: 12/18/2023 CLINICAL DATA:  Hip dislocation. EXAM: DG HIP (WITH OR WITHOUT PELVIS) 2-3V LEFT COMPARISON:  Left knee radiograph dated 12/11/2023. FINDINGS: Bilateral total hip arthroplasties. There is dislocation of the left hip arthroplasty. The femoral component of the arthroplasty is located slightly superior to the acetabular cup. No acute fracture. The bones are osteopenic. The soft tissues are unremarkable. IMPRESSION: Dislocation of the left hip arthroplasty. Electronically Signed   By: Vanetta Chou M.D.   On: 12/18/2023 21:13   DG Hip [place] W or [place] Pelvis 1 View Left Result Date: 12/11/2023 CLINICAL DATA:  Postreduction EXAM: DG HIP (WITH OR WITHOUT PELVIS) 1V PORT LEFT COMPARISON:  Left femur x-ray 12/11/2023 FINDINGS:  Left hip arthroplasty is now in anatomic alignment. There is no acute fracture or hardware loosening. IMPRESSION: Left hip arthroplasty is now in anatomic alignment. Electronically Signed   By: Greig Pique M.D.   On: 12/11/2023 18:12   CT HEAD WO CONTRAST Result Date: 12/11/2023 CLINICAL DATA:  Minor head and neck trauma.  Fall. EXAM: CT HEAD WITHOUT CONTRAST CT CERVICAL SPINE WITHOUT CONTRAST TECHNIQUE: Multidetector CT imaging of the head and cervical spine was performed following the standard protocol without intravenous contrast. Multiplanar CT image reconstructions of the cervical spine were also generated. RADIATION DOSE REDUCTION: This exam was performed according to the departmental dose-optimization program which includes automated exposure control, adjustment of the mA and/or kV according  to patient size and/or use of iterative reconstruction technique. COMPARISON:  Head CT 05/14/2019 FINDINGS: CT HEAD FINDINGS Brain: Ventricles and cisterns are within normal. Mild prominence of the CSF spaces compatible with age related atrophic change. Moderate chronic ischemic microvascular disease is present. There is no mass, mass effect, shift of midline structures or acute hemorrhage. Small lacunar infarcts over the left basal ganglia. Bilateral basal ganglia calcification. Vascular: No hyperdense vessel or unexpected calcification. Skull: Normal. Negative for fracture or focal lesion. Sinuses/Orbits: Orbits incompletely visualized and otherwise unremarkable and unchanged. Paranasal sinuses incompletely visualized demonstrating opacification over the left frontal and ethmoidal sinus unchanged. Other: None. CT CERVICAL SPINE FINDINGS Alignment: Normal. Skull base and vertebrae: Moderate spondylosis throughout the cervical spine to include uncovertebral joint spurring and facet arthropathy. Vertebral body heights are maintained. Significant neural foraminal narrowing from the C2-3 level to the C6-7 level due to adjacent bony spurring. No acute fracture. Soft tissues and spinal canal: Mild to moderate canal stenosis over the mid to left aspect of the spinal canal at the C3-4 level due to broad-based disc bulge and bony spurring. Soft tissues are unremarkable. Disc levels: Disc space narrowing at the C5-6 and C6-7 levels. Broad-base left paracentral disc bulge at the C3-4 level along with associated posterior spurring. Upper chest: No acute findings. Calcified granulomas over the upper lungs. Other: None. IMPRESSION: 1. No acute brain injury. 2. Moderate chronic ischemic microvascular disease and age related atrophic change. 3. No acute cervical spine injury. 4. Moderate spondylosis throughout the cervical spine with disc disease at the C5-6 and C6-7 levels. Mild to moderate canal stenosis over the mid to left  aspect of the spinal canal at the C3-4 level due to broad-based disc bulge and bony spurring. Significant multilevel neural foraminal narrowing due to adjacent bony spurring. Electronically Signed   By: Toribio Agreste M.D.   On: 12/11/2023 15:23   CT CERVICAL SPINE WO CONTRAST Result Date: 12/11/2023 CLINICAL DATA:  Minor head and neck trauma.  Fall. EXAM: CT HEAD WITHOUT CONTRAST CT CERVICAL SPINE WITHOUT CONTRAST TECHNIQUE: Multidetector CT imaging of the head and cervical spine was performed following the standard protocol without intravenous contrast. Multiplanar CT image reconstructions of the cervical spine were also generated. RADIATION DOSE REDUCTION: This exam was performed according to the departmental dose-optimization program which includes automated exposure control, adjustment of the mA and/or kV according to patient size and/or use of iterative reconstruction technique. COMPARISON:  Head CT 05/14/2019 FINDINGS: CT HEAD FINDINGS Brain: Ventricles and cisterns are within normal. Mild prominence of the CSF spaces compatible with age related atrophic change. Moderate chronic ischemic microvascular disease is present. There is no mass, mass effect, shift of midline structures or acute hemorrhage. Small lacunar infarcts over the left basal ganglia. Bilateral basal ganglia  calcification. Vascular: No hyperdense vessel or unexpected calcification. Skull: Normal. Negative for fracture or focal lesion. Sinuses/Orbits: Orbits incompletely visualized and otherwise unremarkable and unchanged. Paranasal sinuses incompletely visualized demonstrating opacification over the left frontal and ethmoidal sinus unchanged. Other: None. CT CERVICAL SPINE FINDINGS Alignment: Normal. Skull base and vertebrae: Moderate spondylosis throughout the cervical spine to include uncovertebral joint spurring and facet arthropathy. Vertebral body heights are maintained. Significant neural foraminal narrowing from the C2-3 level to the  C6-7 level due to adjacent bony spurring. No acute fracture. Soft tissues and spinal canal: Mild to moderate canal stenosis over the mid to left aspect of the spinal canal at the C3-4 level due to broad-based disc bulge and bony spurring. Soft tissues are unremarkable. Disc levels: Disc space narrowing at the C5-6 and C6-7 levels. Broad-base left paracentral disc bulge at the C3-4 level along with associated posterior spurring. Upper chest: No acute findings. Calcified granulomas over the upper lungs. Other: None. IMPRESSION: 1. No acute brain injury. 2. Moderate chronic ischemic microvascular disease and age related atrophic change. 3. No acute cervical spine injury. 4. Moderate spondylosis throughout the cervical spine with disc disease at the C5-6 and C6-7 levels. Mild to moderate canal stenosis over the mid to left aspect of the spinal canal at the C3-4 level due to broad-based disc bulge and bony spurring. Significant multilevel neural foraminal narrowing due to adjacent bony spurring. Electronically Signed   By: Toribio Agreste M.D.   On: 12/11/2023 15:23   DG Chest Portable 1 View Result Date: 12/11/2023 CLINICAL DATA:  fall EXAM: PORTABLE CHEST - 1 VIEW COMPARISON:  None available. FINDINGS: No focal airspace consolidation, pleural effusion, or pneumothorax. No cardiomegaly. Tortuous aorta with aortic atherosclerosis. Linear lucency along the medial left humeral head. Multilevel thoracic osteophytosis. IMPRESSION: 1. No acute cardiopulmonary abnormality. 2. Linear lucency along the medial left humeral head, which may represent a nutrient foramen or soft tissue artifact. If there is concern for fracture, dedicated shoulder radiographs would be recommended. Electronically Signed   By: Rogelia Myers M.D.   On: 12/11/2023 14:48   DG Femur Portable Min 2 Views Left Result Date: 12/11/2023 CLINICAL DATA:  fall, left hip pain EXAM: LEFT FEMUR PORTABLE 2 VIEWS COMPARISON:  None Available. FINDINGS: No acute  fracture. Redemonstrated dislocation of the left hip arthroplasty. There is no evidence of arthropathy or other focal bone abnormality. Soft tissue swelling over the left hip. Peripheral vascular atherosclerosis. IMPRESSION: Redemonstrated dislocation of the left hip arthroplasty. Otherwise, no acute fracture of the femur. Electronically Signed   By: Rogelia Myers M.D.   On: 12/11/2023 14:40   DG Foot Complete Left Result Date: 12/11/2023 CLINICAL DATA:  fall EXAM: LEFT FOOT - COMPLETE 3+ VIEW COMPARISON:  None Available. FINDINGS: No acute fracture or dislocation. There is no evidence of arthropathy or other focal bone abnormality. Peripheral vascular atherosclerosis. No radiopaque foreign body. IMPRESSION: Osteopenia.  No acute fracture or dislocation. Electronically Signed   By: Rogelia Myers M.D.   On: 12/11/2023 14:37   DG Pelvis Portable Result Date: 12/11/2023 CLINICAL DATA:  fall, left hip pain EXAM: PORTABLE PELVIS 1-2 VIEWS COMPARISON:  October 17, 2017 FINDINGS: Osteopenia. Right hip arthroplasty is anatomically aligned without dislocation. The left hip arthroplasty is superiorly dislocated. The acetabular cups appears similarly well-positioned. No evidence of pelvic fracture or diastasis.No acute hip fracture.Multilevel degenerative disc disease of the spine. Soft tissues are unremarkable. IMPRESSION: 1. Superiorly dislocated left hip arthroplasty. 2. Osteopenia.  Otherwise, no acute fracture  visualized. Electronically Signed   By: Rogelia Myers M.D.   On: 12/11/2023 14:36    Microbiology: Results for orders placed or performed during the hospital encounter of 12/18/23  MRSA Next Gen by PCR, Nasal     Status: None   Collection Time: 12/19/23 12:43 PM   Specimen: Nasal Mucosa; Nasal Swab  Result Value Ref Range Status   MRSA by PCR Next Gen NOT DETECTED NOT DETECTED Final    Comment: (NOTE) The GeneXpert MRSA Assay (FDA approved for NASAL specimens only), is one component of a  comprehensive MRSA colonization surveillance program. It is not intended to diagnose MRSA infection nor to guide or monitor treatment for MRSA infections. Test performance is not FDA approved in patients less than 31 years old. Performed at [facility], [address], [place], [place] [address]   Aerobic/Anaerobic Culture w Gram Stain (surgical/deep wound)     Status: None   Collection Time: 12/23/23  8:52 AM   Specimen: Soft Tissue, Other  Result Value Ref Range Status   Specimen Description TISSUE  Final   Special Requests LEFT HIP  Final   Gram Stain   Final    RARE WBC SEEN RARE SQUAMOUS EPITHELIAL CELLS PRESENT NO ORGANISMS SEEN    Culture   Final    RARE MORAXELLA SPECIES BETA LACTAMASE POSITIVE NO ANAEROBES ISOLATED Performed at [facility], [address], [place], [place] [address]    Report Status 12/28/2023 FINAL  Final  Aerobic/Anaerobic Culture w Gram Stain (surgical/deep wound)     Status: None   Collection Time: 12/23/23  8:53 AM   Specimen: Soft Tissue, Other  Result Value Ref Range Status   Specimen Description TISSUE  Final   Special Requests LEFT HIP  Final   Gram Stain RARE WBC SEEN NO ORGANISMS SEEN   Final   Culture   Final    No growth aerobically or anaerobically. Performed at [facility], [address] [place], [place] [address]    Report Status 12/28/2023 FINAL  Final  Aerobic/Anaerobic Culture w Gram Stain (surgical/deep wound)     Status: None   Collection Time: 12/23/23  8:53 AM   Specimen: Soft Tissue, Other  Result Value Ref Range Status   Specimen Description TISSUE  Final   Special Requests LEFT HIP  Final   Gram Stain   Final    FEW WBC PRESENT,BOTH PMN AND MONONUCLEAR NO ORGANISMS SEEN    Culture   Final    No growth aerobically or anaerobically. Performed at [facility], [address] [place], [place] [address]    Report Status 12/28/2023 FINAL  Final  Culture, blood (Routine X 2) w Reflex to ID  Panel     Status: None (Preliminary result)   Collection Time: 12/27/23  6:52 PM   Specimen: BLOOD  Result Value Ref Range Status   Specimen Description BLOOD SITE NOT SPECIFIED  Final   Special Requests   Final    BOTTLES DRAWN AEROBIC AND ANAEROBIC Blood Culture results may not be optimal due to an inadequate volume of blood received in culture bottles   Culture   Final    NO GROWTH 2 DAYS Performed at [facility], [address], [place], [place] [address]    Report Status PENDING  Incomplete  Culture, blood (Routine X 2) w Reflex to ID Panel     Status: None (Preliminary result)   Collection Time: 12/27/23  6:54 PM   Specimen: BLOOD  Result Value Ref Range Status   Specimen Description BLOOD SITE NOT SPECIFIED  Final   Special Requests   Final    BOTTLES DRAWN AEROBIC AND ANAEROBIC Blood Culture adequate volume   Culture   Final    NO GROWTH 2 DAYS Performed at [facility], [address] [place], [place] [address]    Report Status PENDING  Incomplete    Labs: CBC: Recent Labs  Lab 12/23/23 1326 12/24/23 0428 12/25/23 0015  WBC 13.4* 15.0* 13.4*  HGB 10.0* 8.7* 8.2*  HCT 31.3* 26.3* 25.6*  MCV 96.6 94.3 96.6  PLT 224 221 209   Basic Metabolic Panel: Recent Labs  Lab 12/23/23 1326 12/25/23 0015  NA 135 132*  K 4.9 4.3  CL 103 103  CO2 22 21*  GLUCOSE 188* 168*  BUN 28* 29*  CREATININE 1.20* 1.22*  CALCIUM 8.9 8.3*   Liver Function Tests: No results for input(s): AST, ALT, ALKPHOS, BILITOT, PROT, ALBUMIN in the last 168 hours. CBG: Recent Labs  Lab 12/23/23 0729 12/23/23 1130  GLUCAP 117* 169*    Discharge time spent: 42 minutes.  Signed: Deliliah Room, MD [facility] 12/29/2023

## 2023-12-29 NOTE — Progress Notes (Signed)
 [facility]  Date of Admission:  12/18/2023    Madison Martinez is a 89 y.o. female who was brought to the ER on 12/18/2023 for complaints of left hip pain.  Patient apparently rolled sideways in the bed and dislocated. She has history of diabetes mellitus, hypertension, hyperlipidemia, gait issues, CVA, spinal stenosis as well as left eye blindness.   Patient was found to have left hip dislocation.  The left hip was reduced under sedation in the ED.  Patient was found to have mild leukocytosis.   Patient apparently was admitted from 12/11/2023 to 12/14/2023 and was evaluated by orthopedic surgery who had recommended outpatient follow-up and nonweightbearing, knee immobilizer to limit hip motion range.  Patient apparently was discharged to SNF after physical therapy and Occupational Therapy evaluation. Patient underwent left hip arthroplasty revision acetabular component and head ball on 12/23/2023.  The diagnosis was chronic left hip instability after total of arthroplasty which was done 20 years back. Unfortunately the cultures have grown rare Moraxella species, beta-lactamase positive.  So ID consult has been called.   Assessment:   Status post left hip arthroplasty 20 years back. Status post fall and admission to the hospital. Status post left hip arthroplasty revision acetabular component and head ball on 12/23/2023. Cultures grew Moraxella.  Beta-lactamase positive. Patient with left hip septic arthritis and osteomyelitis. Patient probably had some bronchitis or pneumonia which has resulted in hematogenous spread and then a late prosthetic joint infection. Management is ideally surgical debridement often requiring prosthesis removal as well as a lengthy course of antibiotics. Patient has some dementia and is not a good historian Plan: Continue ceftriaxone  2 g IV daily.  Complete a 6-week duration.  EOT-02/07/2024. OPAT orders have been  written. Follow-up with ID.  Patient is scheduled to be seen by ID on 01/17/2024 Patient will be discharged today. Weekly CBC, CMP, ESR, CRP while the patient is on antibiotics. Will sign off.  Please call back if any questions regarding infection arises.   Principal Problem:   Hip dislocation, left (HCC) Active Problems:   Non-insulin  dependent type 2 diabetes mellitus (HCC)   Essential hypertension   Hyperlipidemia   CKD stage 3b, GFR 30-44 ml/min (HCC)   H/O total hip arthroplasty, left   History of dementia   History of CVA (cerebrovascular accident)   Closed dislocation of left hip (HCC)   Acute cystitis   Prosthetic joint infection of left hip    acetaminophen   1,000 mg Oral Q8H   Or   acetaminophen   650 mg Rectal Q8H   aspirin  EC  81 mg Oral BID   clopidogrel   75 mg Oral Daily   polyethylene glycol  17 g Oral Daily   pravastatin   40 mg Oral q1800   QUEtiapine   50 mg Oral QHS   sodium chloride  flush  3 mL Intravenous Q12H   sodium chloride  flush  3 mL Intravenous Q12H    SUBJECTIVE: Patient has no acute complaints.  Very pleasant individual.  She is not in any acute distress.  Review of Systems: Review of Systems  Constitutional: Negative.   HENT: Negative.    Eyes: Negative.   Respiratory: Negative.    Cardiovascular: Negative.   Gastrointestinal: Negative.   Genitourinary: Negative.   Musculoskeletal: Negative.   Skin: Negative.   Neurological: Negative.   Endo/Heme/Allergies: Negative.   Psychiatric/Behavioral: Negative.      Allergies  Allergen Reactions   Bayer Aspirin  [Aspirin ]  Other (See Comments)    Per patient - told avoid while taking Plavix .   Influenza Virus Vaccine Swelling    Localized swelling at injection site   Motrin [Ibuprofen] Other (See Comments)    Per patient - told avoid while taking Plavix  by cardiology   Norvasc [Amlodipine Besylate] Other (See Comments)    Vertigo    OBJECTIVE: Vitals:   12/28/23 1326 12/29/23 0011  12/29/23 0659 12/29/23 0830  BP: (!) 121/54 118/68  120/64  Pulse: 84 83  74  Resp: 18   18  Temp: 98.1 F (36.7 C) 97.9 F (36.6 C)  97.7 F (36.5 C)  TempSrc:  Oral  Oral  SpO2: 100% 100%  100%  Weight:   65.5 kg   Height:       Body mass index is 24.77 kg/m.  Physical Exam HENT:     Head: Normocephalic and atraumatic.     Nose: Nose normal.  Eyes:     Pupils: Pupils are equal, round, and reactive to light.  Cardiovascular:     Heart sounds: Murmur heard.  Pulmonary:     Effort: Pulmonary effort is normal.  Abdominal:     General: Bowel sounds are normal.     Palpations: Abdomen is soft.  Musculoskeletal:     Comments: Right hip dressing is intact.  Range of motion is limited at the right hip.  Skin:    General: Skin is dry.  Neurological:     Mental Status: She is alert and oriented to person, place, and time. Mental status is at baseline.  Psychiatric:        Mood and Affect: Mood normal.        Behavior: Behavior normal.        Thought Content: Thought content normal.        Judgment: Judgment normal.     Lab Results Lab Results  Component Value Date   WBC 13.4 (H) 12/25/2023   HGB 8.2 (L) 12/25/2023   HCT 25.6 (L) 12/25/2023   MCV 96.6 12/25/2023   PLT 209 12/25/2023    Lab Results  Component Value Date   CREATININE 1.22 (H) 12/25/2023   BUN 29 (H) 12/25/2023   NA 132 (L) 12/25/2023   K 4.3 12/25/2023   CL 103 12/25/2023   CO2 21 (L) 12/25/2023    Lab Results  Component Value Date   ALT 15 12/19/2023   AST 23 12/19/2023   ALKPHOS 84 12/19/2023   BILITOT 0.5 12/19/2023     Microbiology: Recent Results (from the past 240 hours)  Aerobic/Anaerobic Culture w Gram Stain (surgical/deep wound)     Status: None   Collection Time: 12/23/23  8:52 AM   Specimen: Soft Tissue, Other  Result Value Ref Range Status   Specimen Description TISSUE  Final   Special Requests LEFT HIP  Final   Gram Stain   Final    RARE WBC SEEN RARE SQUAMOUS EPITHELIAL  CELLS PRESENT NO ORGANISMS SEEN    Culture   Final    RARE MORAXELLA SPECIES BETA LACTAMASE POSITIVE NO ANAEROBES ISOLATED Performed at [facility], [address], [place], [place] [address]    Report Status 12/28/2023 FINAL  Final  Aerobic/Anaerobic Culture w Gram Stain (surgical/deep wound)     Status: None   Collection Time: 12/23/23  8:53 AM   Specimen: Soft Tissue, Other  Result Value Ref Range Status   Specimen Description TISSUE  Final   Special Requests LEFT HIP  Final   Gram Stain RARE WBC SEEN NO ORGANISMS SEEN   Final   Culture   Final    No growth aerobically or anaerobically. Performed at [facility], [address], [place], [place] [address]    Report Status 12/28/2023 FINAL  Final  Aerobic/Anaerobic Culture w Gram Stain (surgical/deep wound)     Status: None   Collection Time: 12/23/23  8:53 AM   Specimen: Soft Tissue, Other  Result Value Ref Range Status   Specimen Description TISSUE  Final   Special Requests LEFT HIP  Final   Gram Stain   Final    FEW WBC PRESENT,BOTH PMN AND MONONUCLEAR NO ORGANISMS SEEN    Culture   Final    No growth aerobically or anaerobically. Performed at [facility], [address], [place], [place] [address]    Report Status 12/28/2023 FINAL  Final  Culture, blood (Routine X 2) w Reflex to ID Panel     Status: None (Preliminary result)   Collection Time: 12/27/23  6:52 PM   Specimen: BLOOD  Result Value Ref Range Status   Specimen Description BLOOD SITE NOT SPECIFIED  Final   Special Requests   Final    BOTTLES DRAWN AEROBIC AND ANAEROBIC Blood Culture results may not be optimal due to an inadequate volume of blood received in culture bottles   Culture   Final    NO GROWTH 2 DAYS Performed at [facility], [address], [place], [place] [address]    Report Status PENDING  Incomplete  Culture, blood (Routine X 2) w Reflex to ID Panel     Status: None (Preliminary result)   Collection Time:  12/27/23  6:54 PM   Specimen: BLOOD  Result Value Ref Range Status   Specimen Description BLOOD SITE NOT SPECIFIED  Final   Special Requests   Final    BOTTLES DRAWN AEROBIC AND ANAEROBIC Blood Culture adequate volume   Culture   Final    NO GROWTH 2 DAYS Performed at [facility], [address], [place], [place] [address]    Report Status PENDING  Incomplete    Dr. Zoanne, MD [facility] Cell phone [phone]    Total Encounter Time: 50 minutes

## 2023-12-29 NOTE — Progress Notes (Signed)
 Peripherally Inserted Central Catheter Placement  The IV Nurse has discussed with the patient and/or persons authorized to consent for the patient, the purpose of this procedure and the potential benefits and risks involved with this procedure.  The benefits include less needle sticks, lab draws from the catheter, and the patient may be discharged home with the catheter. Risks include, but not limited to, infection, bleeding, blood clot (thrombus formation), and puncture of an artery; nerve damage and irregular heartbeat and possibility to perform a PICC exchange if needed/ordered by physician.  Alternatives to this procedure were also discussed.  Bard Power PICC patient education guide, fact sheet on infection prevention and patient information card has been provided to patient /or left at bedside.    PICC Placement Documentation  PICC Single Lumen 12/29/23 Right Basilic 36 cm 0 cm (Active)  Indication for Insertion or Continuance of Line Prolonged intravenous therapies 12/29/23 1704  Exposed Catheter (cm) 0 cm 12/29/23 1704  Site Assessment Clean, Dry, Intact 12/29/23 1704  Line Status Flushed;Saline locked;Blood return noted 12/29/23 1704  Dressing Type Transparent;Securing device 12/29/23 1704  Dressing Status Antimicrobial disc/dressing in place;Clean, Dry, Intact 12/29/23 1704  Line Care Connections checked and tightened 12/29/23 1704  Line Adjustment (NICU/IV Team Only) No 12/29/23 1704  Dressing Intervention New dressing;Adhesive placed at insertion site (IV team only) 12/29/23 1704  Dressing Change Due 01/05/24 12/29/23 1704    Patient's son, Madison Martinez, signed PICC consent via telephone. Verified by 2 PICC RNs.   Madison Martinez 12/29/2023, 5:05 PM

## 2024-01-01 ENCOUNTER — Other Ambulatory Visit: Payer: Self-pay | Admitting: Family Medicine

## 2024-01-01 DIAGNOSIS — M869 Osteomyelitis, unspecified: Secondary | ICD-10-CM | POA: Diagnosis not present

## 2024-01-01 DIAGNOSIS — I639 Cerebral infarction, unspecified: Secondary | ICD-10-CM | POA: Diagnosis not present

## 2024-01-01 DIAGNOSIS — Z96642 Presence of left artificial hip joint: Secondary | ICD-10-CM | POA: Diagnosis not present

## 2024-01-01 DIAGNOSIS — M009 Pyogenic arthritis, unspecified: Secondary | ICD-10-CM | POA: Diagnosis not present

## 2024-01-01 LAB — CULTURE, BLOOD (ROUTINE X 2)
Culture: NO GROWTH
Culture: NO GROWTH
Special Requests: ADEQUATE

## 2024-01-03 DIAGNOSIS — Z8673 Personal history of transient ischemic attack (TIA), and cerebral infarction without residual deficits: Secondary | ICD-10-CM | POA: Diagnosis not present

## 2024-01-03 DIAGNOSIS — E785 Hyperlipidemia, unspecified: Secondary | ICD-10-CM | POA: Diagnosis not present

## 2024-01-03 DIAGNOSIS — Z471 Aftercare following joint replacement surgery: Secondary | ICD-10-CM | POA: Diagnosis not present

## 2024-01-03 DIAGNOSIS — M6281 Muscle weakness (generalized): Secondary | ICD-10-CM | POA: Diagnosis not present

## 2024-01-03 DIAGNOSIS — Z9181 History of falling: Secondary | ICD-10-CM | POA: Diagnosis not present

## 2024-01-03 DIAGNOSIS — T8452XS Infection and inflammatory reaction due to internal left hip prosthesis, sequela: Secondary | ICD-10-CM | POA: Diagnosis not present

## 2024-01-03 DIAGNOSIS — R2689 Other abnormalities of gait and mobility: Secondary | ICD-10-CM | POA: Diagnosis not present

## 2024-01-03 DIAGNOSIS — S73035S Other anterior dislocation of left hip, sequela: Secondary | ICD-10-CM | POA: Diagnosis not present

## 2024-01-03 DIAGNOSIS — M009 Pyogenic arthritis, unspecified: Secondary | ICD-10-CM | POA: Diagnosis not present

## 2024-01-05 DIAGNOSIS — I10 Essential (primary) hypertension: Secondary | ICD-10-CM | POA: Diagnosis not present

## 2024-01-05 DIAGNOSIS — N1832 Chronic kidney disease, stage 3b: Secondary | ICD-10-CM | POA: Diagnosis not present

## 2024-01-05 DIAGNOSIS — Z471 Aftercare following joint replacement surgery: Secondary | ICD-10-CM | POA: Diagnosis not present

## 2024-01-05 DIAGNOSIS — M86052 Acute hematogenous osteomyelitis, left femur: Secondary | ICD-10-CM | POA: Diagnosis not present

## 2024-01-05 DIAGNOSIS — E119 Type 2 diabetes mellitus without complications: Secondary | ICD-10-CM | POA: Diagnosis not present

## 2024-01-05 DIAGNOSIS — I700 Atherosclerosis of aorta: Secondary | ICD-10-CM | POA: Diagnosis not present

## 2024-01-05 DIAGNOSIS — I639 Cerebral infarction, unspecified: Secondary | ICD-10-CM | POA: Diagnosis not present

## 2024-01-08 DIAGNOSIS — Z471 Aftercare following joint replacement surgery: Secondary | ICD-10-CM | POA: Diagnosis not present

## 2024-01-09 DIAGNOSIS — Z96649 Presence of unspecified artificial hip joint: Secondary | ICD-10-CM | POA: Diagnosis not present

## 2024-01-10 DIAGNOSIS — Z471 Aftercare following joint replacement surgery: Secondary | ICD-10-CM | POA: Diagnosis not present

## 2024-01-10 DIAGNOSIS — T8452XS Infection and inflammatory reaction due to internal left hip prosthesis, sequela: Secondary | ICD-10-CM | POA: Diagnosis not present

## 2024-01-10 DIAGNOSIS — M009 Pyogenic arthritis, unspecified: Secondary | ICD-10-CM | POA: Diagnosis not present

## 2024-01-10 DIAGNOSIS — M6281 Muscle weakness (generalized): Secondary | ICD-10-CM | POA: Diagnosis not present

## 2024-01-10 DIAGNOSIS — Z9181 History of falling: Secondary | ICD-10-CM | POA: Diagnosis not present

## 2024-01-10 DIAGNOSIS — T8149XA Infection following a procedure, other surgical site, initial encounter: Secondary | ICD-10-CM | POA: Diagnosis not present

## 2024-01-10 DIAGNOSIS — R2689 Other abnormalities of gait and mobility: Secondary | ICD-10-CM | POA: Diagnosis not present

## 2024-01-10 DIAGNOSIS — Z8673 Personal history of transient ischemic attack (TIA), and cerebral infarction without residual deficits: Secondary | ICD-10-CM | POA: Diagnosis not present

## 2024-01-10 DIAGNOSIS — S73035S Other anterior dislocation of left hip, sequela: Secondary | ICD-10-CM | POA: Diagnosis not present

## 2024-01-17 ENCOUNTER — Inpatient Hospital Stay: Admitting: Internal Medicine

## 2024-01-22 ENCOUNTER — Encounter: Payer: Self-pay | Admitting: Radiology

## 2024-01-26 ENCOUNTER — Ambulatory Visit: Admitting: Family Medicine

## 2024-02-05 ENCOUNTER — Other Ambulatory Visit: Payer: Self-pay | Admitting: Family Medicine

## 2024-02-18 ENCOUNTER — Encounter: Payer: Self-pay | Admitting: Pharmacist

## 2024-02-18 NOTE — Progress Notes (Signed)
 Pharmacy Quality Measure Review  This patient is appearing on a report for being at risk of failing the adherence measure for hypertension (ACEi/ARB) medications this calendar year.   Medication: lisinopril  Last fill date: 10/30/2023 for 100 [address] supply per adherence report  Reviewed recent refill history in Dr Annemarie database. Actual last refill date was 01/31/2024 for 80 [place] supply - filled by [facility]. Per ortho notes from 02/13/2024 patient is still in rehab facility. Also note that lisinopril  is no longer on her med list - per discharge notes she was to hold lisinopril  until reassessed by PCP.   No additional intervention at this time.   Madelin Ray, PharmD Clinical Pharmacist [facility]  Population Health [phone]

## 2024-02-18 NOTE — Progress Notes (Signed)
 Opened in error

## 2024-02-23 ENCOUNTER — Inpatient Hospital Stay: Admitting: Internal Medicine

## 2024-03-06 ENCOUNTER — Inpatient Hospital Stay: Admitting: Internal Medicine

## 2024-03-13 ENCOUNTER — Other Ambulatory Visit: Payer: Self-pay | Admitting: Family Medicine

## 2024-04-13 ENCOUNTER — Emergency Department (HOSPITAL_COMMUNITY)
Admission: EM | Admit: 2024-04-13 | Discharge: 2024-04-13 | Disposition: A | Discharge status: Home / Self Care | Attending: Emergency Medicine | Admitting: Emergency Medicine

## 2024-04-13 ENCOUNTER — Emergency Department (HOSPITAL_COMMUNITY)

## 2024-04-13 ENCOUNTER — Other Ambulatory Visit: Payer: Self-pay

## 2024-04-13 DIAGNOSIS — F0390 Unspecified dementia without behavioral disturbance: Secondary | ICD-10-CM | POA: Diagnosis not present

## 2024-04-13 DIAGNOSIS — M542 Cervicalgia: Secondary | ICD-10-CM | POA: Insufficient documentation

## 2024-04-13 DIAGNOSIS — R519 Headache, unspecified: Secondary | ICD-10-CM | POA: Diagnosis present

## 2024-04-13 DIAGNOSIS — W19XXXA Unspecified fall, initial encounter: Secondary | ICD-10-CM

## 2024-04-13 DIAGNOSIS — W1830XA Fall on same level, unspecified, initial encounter: Secondary | ICD-10-CM | POA: Diagnosis not present

## 2024-04-13 DIAGNOSIS — S0003XA Contusion of scalp, initial encounter: Secondary | ICD-10-CM | POA: Insufficient documentation

## 2024-04-13 DIAGNOSIS — L97429 Non-pressure chronic ulcer of left heel and midfoot with unspecified severity: Secondary | ICD-10-CM | POA: Diagnosis not present

## 2024-04-13 DIAGNOSIS — Z7902 Long term (current) use of antithrombotics/antiplatelets: Secondary | ICD-10-CM | POA: Insufficient documentation

## 2024-04-13 DIAGNOSIS — N390 Urinary tract infection, site not specified: Secondary | ICD-10-CM | POA: Insufficient documentation

## 2024-04-13 LAB — URINALYSIS, ROUTINE W REFLEX MICROSCOPIC
Bilirubin Urine: NEGATIVE
Glucose, UA: NEGATIVE mg/dL
Hgb urine dipstick: NEGATIVE
Ketones, ur: NEGATIVE mg/dL
Nitrite: NEGATIVE
Protein, ur: 30 mg/dL — AB
Specific Gravity, Urine: 1.011 (ref 1.005–1.030)
WBC, UA: >50 WBC/hpf (ref 0–5)
pH: 7 (ref 5.0–8.0)

## 2024-04-13 LAB — CBC
HCT: 39.6 % (ref 36.0–46.0)
Hemoglobin: 12.7 g/dL (ref 12.0–15.0)
MCH: 30.2 pg (ref 26.0–34.0)
MCHC: 32.1 g/dL (ref 30.0–36.0)
MCV: 94.1 fL (ref 80.0–100.0)
Platelets: 216 10*3/uL (ref 150–400)
RBC: 4.21 MIL/uL (ref 3.87–5.11)
RDW: 13.1 % (ref 11.5–15.5)
WBC: 10 10*3/uL (ref 4.0–10.5)
nRBC: 0 % (ref 0.0–0.2)

## 2024-04-13 LAB — COMPREHENSIVE METABOLIC PANEL WITH GFR
ALT: 24 U/L (ref 0–44)
AST: 41 U/L (ref 15–41)
Albumin: 3.7 g/dL (ref 3.5–5.0)
Alkaline Phosphatase: 153 U/L — ABNORMAL HIGH (ref 38–126)
Anion gap: 9 (ref 5–15)
BUN: 33 mg/dL — ABNORMAL HIGH (ref 8–23)
CO2: 27 mmol/L (ref 22–32)
Calcium: 11.7 mg/dL — ABNORMAL HIGH (ref 8.9–10.3)
Chloride: 104 mmol/L (ref 98–111)
Creatinine, Ser: 1.14 mg/dL — ABNORMAL HIGH (ref 0.44–1.00)
GFR, Estimated: 44 mL/min — ABNORMAL LOW
Glucose, Bld: 155 mg/dL — ABNORMAL HIGH (ref 70–99)
Potassium: 4.2 mmol/L (ref 3.5–5.1)
Sodium: 140 mmol/L (ref 135–145)
Total Bilirubin: 0.3 mg/dL (ref 0.0–1.2)
Total Protein: 7.6 g/dL (ref 6.5–8.1)

## 2024-04-13 MED ORDER — CEPHALEXIN 500 MG PO CAPS: 500.0000 mg | ORAL_CAPSULE | Freq: Two times a day (BID) | ORAL | 0 refills | Status: AC

## 2024-04-13 MED ORDER — CEPHALEXIN 250 MG PO CAPS
500.0000 mg | ORAL_CAPSULE | Freq: Once | ORAL | Status: AC
  Administered 2024-04-13: 500 mg via ORAL
  Filled 2024-04-13: qty 2

## 2024-04-13 NOTE — ED Notes (Signed)
 Ptar called

## 2024-04-13 NOTE — ED Provider Notes (Signed)
 " [place] [facility] Provider Note   CSN: [id number] Arrival date & time: 04/13/24  1305     Patient presents with: Madison Martinez is a 89 y.o. female.  {Add pertinent medical, surgical, social history, OB history to YEP:67052}  Fall         Prior to Admission medications  Medication Sig Start Date End Date Taking? Authorizing Provider  Cholecalciferol  (VITAMIN D -3 PO) Take 1 capsule by mouth daily.    [provider]  clopidogrel  (PLAVIX ) 75 MG tablet TAKE 1 TABLET BY MOUTH DAILY 03/15/24   Kuneff, Renee A, DO  dorzolamide -timolol  (COSOPT ) 2-0.5 % ophthalmic solution Place 1 drop into both eyes 2 (two) times daily. Patient taking differently: Place 1 drop into the right eye 2 (two) times daily. 07/25/23   Kuneff, Renee A, DO  Multiple Vitamins-Minerals (MULTIVITAMIN WOMEN 50+) TABS Take 1 tablet by mouth daily.    [provider]  Omega-3 Fatty Acids (FISH OIL PO) Take 1 capsule by mouth daily.    [provider]  pravastatin  (PRAVACHOL ) 40 MG tablet Take 1 tablet (40 mg total) by mouth daily. 10/30/23   Kuneff, Renee A, DO  QUEtiapine  (SEROQUEL ) 50 MG tablet Take 1 tablet (50 mg total) by mouth at bedtime. 12/29/23   Rashid, Farhan, MD  VYZULTA  0.024 % SOLN Apply 1 drop to eye at bedtime. Patient taking differently: Place 1 drop into both eyes at bedtime. 07/25/23   Kuneff, Renee A, DO    Allergies: Bayer aspirin  [aspirin ], Influenza virus vaccine, Motrin [ibuprofen], and Norvasc [amlodipine besylate]    Review of Systems  Updated Vital Signs BP (!) 192/81   Pulse 91   Temp 97.8 F (36.6 C) (Oral)   LMP 03/21/1978   SpO2 99%   Physical Exam Vitals and nursing note reviewed.  Constitutional:      General: She is not in acute distress. HENT:     Head: Normocephalic and atraumatic.     Nose: Nose normal.     Mouth/Throat:     Mouth: Mucous membranes are moist.  Eyes:     General: No scleral  icterus. Cardiovascular:     Rate and Rhythm: Normal rate and regular rhythm.     Pulses: Normal pulses.     Heart sounds: Normal heart sounds.  Pulmonary:     Effort: Pulmonary effort is normal. No respiratory distress.     Breath sounds: No wheezing.  Abdominal:     Palpations: Abdomen is soft.     Tenderness: There is no abdominal tenderness.  Musculoskeletal:     Cervical back: Normal range of motion.     Right lower leg: No edema.     Left lower leg: No edema.  Skin:    General: Skin is warm and dry.     Capillary Refill: Capillary refill takes less than 2 seconds.     Comments: Posterior heel wound no erythema or evidence of infection  Abrasion over hematoma to the posterior scalp which appears to be well-healing indicating not a new injury  No sacral skin breakdown  Neurological:     Mental Status: She is alert. Mental status is at baseline.     Comments: Normal strength and sensory exam nml   Psychiatric:        Mood and Affect: Mood normal.        Behavior: Behavior normal.     (all labs ordered are listed, but only abnormal  results are displayed) Labs Reviewed  CBC  COMPREHENSIVE METABOLIC PANEL WITH GFR  URINALYSIS, ROUTINE W REFLEX MICROSCOPIC    EKG: None  Radiology: No results found.  {Document cardiac monitor, telemetry assessment procedure when appropriate:32947} Procedures   Medications Ordered in the ED - No data to display    {Click here for ABCD2, HEART and other calculators REFRESH Note before signing:1}                              Medical Decision Making Amount and/or Complexity of Data Reviewed Labs: ordered. Radiology: ordered.   ***  {Document critical care time when appropriate  Document review of labs and clinical decision tools ie CHADS2VASC2, etc  Document your independent review of radiology images and any outside records  Document your discussion with family members, caretakers and with consultants  Document social  determinants of health affecting pt's care  Document your decision making why or why not admission, treatments were needed:32947:::1}   Final diagnoses:  None    ED Discharge Orders     None        "

## 2024-04-13 NOTE — ED Notes (Signed)
 Attempted to call report x3 to [facility] unsuccessfully. VM left.

## 2024-04-13 NOTE — ED Provider Notes (Signed)
 Patient signed out to myself by [place], PA-C pending UA and imaging.  In short, patient is a 89 year old female presenting today from [facility] after a unwitnessed fall.  Patient has dementia at baseline.  Patient has hematoma noted to back of head and is complaining of left foot pain where she has a known pressure ulcer.  Patient denies any other complaints at this time.  Patient is anticoagulated on Plavix .  Labs: Elevated glucose at 155, elevated bun at 33, elevated creatinine at 1.14 which is patient's baseline, mildly elevated calcium at 11.7, alk phos at 153, CBC unremarkable, UA with large hemoglobin, rare bacteria, 30 protein, 6-10 RBCs, greater than 50 WBCs.  Imaging: CT head and C-spine Noncon: No acute fracture or traumatic malalignment in the cervical spine.  Moderate to severe osseous spinal canal stenosis at C3-C4 due to prominent disc osteophyte complex and mineralization of the ligamentum flavum.  Consider nonemergent evaluation with MRI.  No acute intracranial abnormality.  Left parietal scalp hematoma. Lumbar spine x-ray: Chronic T12 compression deformity with about 40% loss of height, unchanged since prior CT on 05/14/2019.   Medications: Keflex   Considered for admission or further workup however patient's vital signs, physical exam, labs, and imaging are reassuring.  Patient did appear to have a UTI on urine analysis and will be treated outpatient with Keflex .  Patient advised to take Tylenol  as needed for musculoskeletal pain.  Patient hypertensive while in ED, per chart review patient is typically hypertensive and not currently taking any blood pressure medications.  Patient vies to follow-up with PCP for evaluation of hypertension.  Patient given return precautions.  I feel patient is safe for discharge at this time.   Francis Ileana SAILOR, PA-C 04/13/24 1627    Doretha Folks, MD 04/13/24 2049

## 2024-04-13 NOTE — ED Triage Notes (Signed)
 Pt BIB [facility] from [facility] after a fall. Per EMS, pt was found with feet on the bed and pt laying on her back on the ground. Pt has dementia at baseline, per facility pt is at baseline. Per EMS, no LOC, knot noted to back of head, pt complaining of L foot pain where there is a pressure ulcer. No other pain reported. VSS per EMS.

## 2024-04-13 NOTE — Discharge Instructions (Signed)
 Today you were seen after a fall.  Your urine appeared to be infected and we have prescribed you Keflex  to take outpatient.  Your blood pressure while in the ED was elevated, please follow-up with your primary care for further evaluation and possible treatment for hypertension.  Thank you for letting us  treat you today. After reviewing your labs and imaging, I feel you are safe to go home. Please follow up with your PCP in the next several days and provide them with your records from this visit. Return to the Emergency Room if pain becomes severe or symptoms worsen.

## 2024-04-16 ENCOUNTER — Telehealth: Payer: Self-pay

## 2024-04-16 NOTE — Telephone Encounter (Signed)
 Patient son, Max (DPR) came to office to get letter from Dr. Catherine.  Patient is trying to get [facility] and [facility] has homebound service if patient is unable to get to [facility] office and is homebound. Patient is currently at [facility] facility. Son would letter completed and Homebound Service request form faxed to [facility] [phone]

## 2024-04-17 ENCOUNTER — Other Ambulatory Visit: Payer: Self-pay | Admitting: Family Medicine

## 2024-04-17 DIAGNOSIS — Z0279 Encounter for issue of other medical certificate: Secondary | ICD-10-CM

## 2024-04-17 NOTE — Telephone Encounter (Signed)
 Please draft letter to [facility] Stating Madison Martinez DOB: [birth year] is under my care and is homebound due to chronic illnesses of wheelchair-bound, total hip replacement, monoclonal B-cell lymphocytosis, diabetes, and stroke  and will require a homebound service to obtain [employer] ID.   The provided form does not have anything for this provider to fill out or sign

## 2024-04-17 NOTE — Telephone Encounter (Signed)
 Letter printed and signed by PCP.

## 2024-04-17 NOTE — Telephone Encounter (Signed)
 Pt son made aware of form completion. Faxed form per pt son request.
